# Patient Record
Sex: Male | Born: 1935 | Race: White | Hispanic: No | Marital: Married | Smoking: Never smoker
Health system: Southern US, Community
[De-identification: ages and names within clinical notes are randomized; demographics above are authoritative.]

## PROBLEM LIST (undated history)

## (undated) DIAGNOSIS — Z860100 Personal history of colon polyps, unspecified: Secondary | ICD-10-CM

## (undated) DIAGNOSIS — R351 Nocturia: Secondary | ICD-10-CM

## (undated) DIAGNOSIS — M549 Dorsalgia, unspecified: Secondary | ICD-10-CM

## (undated) DIAGNOSIS — M1990 Unspecified osteoarthritis, unspecified site: Secondary | ICD-10-CM

## (undated) DIAGNOSIS — K449 Diaphragmatic hernia without obstruction or gangrene: Secondary | ICD-10-CM

## (undated) DIAGNOSIS — I639 Cerebral infarction, unspecified: Secondary | ICD-10-CM

## (undated) DIAGNOSIS — H269 Unspecified cataract: Secondary | ICD-10-CM

## (undated) DIAGNOSIS — I10 Essential (primary) hypertension: Secondary | ICD-10-CM

## (undated) DIAGNOSIS — I2510 Atherosclerotic heart disease of native coronary artery without angina pectoris: Secondary | ICD-10-CM

## (undated) DIAGNOSIS — G4730 Sleep apnea, unspecified: Secondary | ICD-10-CM

## (undated) DIAGNOSIS — G8929 Other chronic pain: Secondary | ICD-10-CM

## (undated) DIAGNOSIS — I209 Angina pectoris, unspecified: Secondary | ICD-10-CM

## (undated) DIAGNOSIS — E785 Hyperlipidemia, unspecified: Secondary | ICD-10-CM

## (undated) DIAGNOSIS — R233 Spontaneous ecchymoses: Secondary | ICD-10-CM

## (undated) DIAGNOSIS — I219 Acute myocardial infarction, unspecified: Secondary | ICD-10-CM

## (undated) DIAGNOSIS — K219 Gastro-esophageal reflux disease without esophagitis: Secondary | ICD-10-CM

## (undated) DIAGNOSIS — E119 Type 2 diabetes mellitus without complications: Secondary | ICD-10-CM

## (undated) DIAGNOSIS — D61818 Other pancytopenia: Secondary | ICD-10-CM

## (undated) DIAGNOSIS — R252 Cramp and spasm: Secondary | ICD-10-CM

## (undated) DIAGNOSIS — G459 Transient cerebral ischemic attack, unspecified: Secondary | ICD-10-CM

## (undated) DIAGNOSIS — K5790 Diverticulosis of intestine, part unspecified, without perforation or abscess without bleeding: Secondary | ICD-10-CM

## (undated) DIAGNOSIS — M25569 Pain in unspecified knee: Secondary | ICD-10-CM

## (undated) DIAGNOSIS — S14129A Central cord syndrome at unspecified level of cervical spinal cord, initial encounter: Secondary | ICD-10-CM

## (undated) DIAGNOSIS — R238 Other skin changes: Secondary | ICD-10-CM

## (undated) DIAGNOSIS — R350 Frequency of micturition: Secondary | ICD-10-CM

## (undated) DIAGNOSIS — C439 Malignant melanoma of skin, unspecified: Secondary | ICD-10-CM

## (undated) HISTORY — DX: Acute myocardial infarction, unspecified: I21.9

## (undated) HISTORY — DX: Unspecified osteoarthritis, unspecified site: M19.90

## (undated) HISTORY — DX: Diaphragmatic hernia without obstruction or gangrene: K44.9

## (undated) HISTORY — DX: Pain in unspecified knee: M25.569

## (undated) HISTORY — DX: Other pancytopenia: D61.818

## (undated) HISTORY — DX: Angina pectoris, unspecified: I20.9

## (undated) HISTORY — PX: BACK SURGERY: SHX140

## (undated) HISTORY — DX: Essential (primary) hypertension: I10

## (undated) HISTORY — PX: TONSILLECTOMY: SUR1361

## (undated) HISTORY — PX: COLONOSCOPY: SHX174

## (undated) HISTORY — PX: APPENDECTOMY: SHX54

## (undated) HISTORY — DX: Atherosclerotic heart disease of native coronary artery without angina pectoris: I25.10

## (undated) HISTORY — DX: Sleep apnea, unspecified: G47.30

## (undated) HISTORY — DX: Cerebral infarction, unspecified: I63.9

## (undated) HISTORY — DX: Gastro-esophageal reflux disease without esophagitis: K21.9

---

## 1995-07-20 HISTORY — PX: KNEE SURGERY: SHX244

## 1997-07-19 HISTORY — PX: KNEE ARTHROSCOPY: SUR90

## 1998-02-04 ENCOUNTER — Other Ambulatory Visit: Admission: RE | Admit: 1998-02-04 | Discharge: 1998-02-04 | Payer: Self-pay | Admitting: Internal Medicine

## 1998-03-13 ENCOUNTER — Ambulatory Visit (HOSPITAL_COMMUNITY): Admission: RE | Admit: 1998-03-13 | Discharge: 1998-03-13 | Payer: Self-pay | Admitting: Gastroenterology

## 1999-07-20 DIAGNOSIS — I219 Acute myocardial infarction, unspecified: Secondary | ICD-10-CM

## 1999-07-20 HISTORY — DX: Acute myocardial infarction, unspecified: I21.9

## 1999-09-03 ENCOUNTER — Encounter: Payer: Self-pay | Admitting: Cardiology

## 1999-09-03 ENCOUNTER — Inpatient Hospital Stay (HOSPITAL_COMMUNITY): Admission: EM | Admit: 1999-09-03 | Discharge: 1999-09-05 | Payer: Self-pay | Admitting: Emergency Medicine

## 1999-09-04 HISTORY — PX: CORONARY ANGIOPLASTY: SHX604

## 1999-09-28 ENCOUNTER — Encounter (HOSPITAL_COMMUNITY): Admission: RE | Admit: 1999-09-28 | Discharge: 1999-12-27 | Payer: Self-pay | Admitting: *Deleted

## 1999-12-28 ENCOUNTER — Encounter (HOSPITAL_COMMUNITY): Admission: RE | Admit: 1999-12-28 | Discharge: 2000-03-27 | Payer: Self-pay | Admitting: *Deleted

## 2000-03-31 ENCOUNTER — Encounter: Payer: Self-pay | Admitting: *Deleted

## 2000-03-31 ENCOUNTER — Encounter: Admission: RE | Admit: 2000-03-31 | Discharge: 2000-03-31 | Payer: Self-pay | Admitting: *Deleted

## 2000-07-06 ENCOUNTER — Inpatient Hospital Stay (HOSPITAL_COMMUNITY): Admission: EM | Admit: 2000-07-06 | Discharge: 2000-07-08 | Payer: Self-pay | Admitting: Emergency Medicine

## 2000-07-07 ENCOUNTER — Encounter: Payer: Self-pay | Admitting: Interventional Cardiology

## 2000-07-27 ENCOUNTER — Encounter: Payer: Self-pay | Admitting: Internal Medicine

## 2000-07-27 ENCOUNTER — Encounter: Admission: RE | Admit: 2000-07-27 | Discharge: 2000-07-27 | Payer: Self-pay | Admitting: Internal Medicine

## 2001-04-06 ENCOUNTER — Encounter: Payer: Self-pay | Admitting: Emergency Medicine

## 2001-04-06 ENCOUNTER — Emergency Department (HOSPITAL_COMMUNITY): Admission: EM | Admit: 2001-04-06 | Discharge: 2001-04-06 | Payer: Self-pay | Admitting: Emergency Medicine

## 2001-04-11 ENCOUNTER — Emergency Department (HOSPITAL_COMMUNITY): Admission: EM | Admit: 2001-04-11 | Discharge: 2001-04-11 | Payer: Self-pay

## 2003-04-29 ENCOUNTER — Ambulatory Visit (HOSPITAL_COMMUNITY): Admission: RE | Admit: 2003-04-29 | Discharge: 2003-04-29 | Payer: Self-pay | Admitting: Gastroenterology

## 2005-04-29 ENCOUNTER — Encounter: Admission: RE | Admit: 2005-04-29 | Discharge: 2005-04-29 | Payer: Self-pay | Admitting: Internal Medicine

## 2005-05-07 ENCOUNTER — Encounter: Admission: RE | Admit: 2005-05-07 | Discharge: 2005-05-07 | Payer: Self-pay | Admitting: Internal Medicine

## 2005-07-19 DIAGNOSIS — G459 Transient cerebral ischemic attack, unspecified: Secondary | ICD-10-CM

## 2005-07-19 HISTORY — DX: Transient cerebral ischemic attack, unspecified: G45.9

## 2005-08-26 ENCOUNTER — Emergency Department (HOSPITAL_COMMUNITY): Admission: EM | Admit: 2005-08-26 | Discharge: 2005-08-26 | Payer: Self-pay | Admitting: Emergency Medicine

## 2005-09-02 ENCOUNTER — Ambulatory Visit (HOSPITAL_COMMUNITY): Admission: RE | Admit: 2005-09-02 | Discharge: 2005-09-02 | Payer: Self-pay | Admitting: Internal Medicine

## 2005-09-03 ENCOUNTER — Encounter: Admission: RE | Admit: 2005-09-03 | Discharge: 2005-09-03 | Payer: Self-pay | Admitting: Internal Medicine

## 2009-08-28 ENCOUNTER — Ambulatory Visit: Payer: Self-pay | Admitting: Hematology and Oncology

## 2009-09-03 LAB — MORPHOLOGY: PLT EST: ADEQUATE

## 2009-09-03 LAB — CBC WITH DIFFERENTIAL/PLATELET
Basophils Absolute: 0 10*3/uL (ref 0.0–0.1)
Eosinophils Absolute: 0.1 10*3/uL (ref 0.0–0.5)
HGB: 11.8 g/dL — ABNORMAL LOW (ref 13.0–17.1)
MCV: 103.6 fL — ABNORMAL HIGH (ref 79.3–98.0)
MONO%: 10.9 % (ref 0.0–14.0)
NEUT#: 3.5 10*3/uL (ref 1.5–6.5)
RDW: 12.4 % (ref 11.0–14.6)

## 2009-09-05 ENCOUNTER — Ambulatory Visit (HOSPITAL_COMMUNITY): Admission: RE | Admit: 2009-09-05 | Discharge: 2009-09-05 | Payer: Self-pay | Admitting: Hematology and Oncology

## 2009-09-05 LAB — PROTEIN ELECTROPHORESIS, SERUM, WITH REFLEX
Albumin ELP: 40.4 % — ABNORMAL LOW (ref 55.8–66.1)
Beta 2: 2.5 % — ABNORMAL LOW (ref 3.2–6.5)
Gamma Globulin: 40.9 % — ABNORMAL HIGH (ref 11.1–18.8)

## 2009-09-05 LAB — IFE INTERPRETATION

## 2009-09-05 LAB — COMPREHENSIVE METABOLIC PANEL
ALT: 22 U/L (ref 0–53)
AST: 18 U/L (ref 0–37)
Albumin: 3.5 g/dL (ref 3.5–5.2)
BUN: 23 mg/dL (ref 6–23)
Calcium: 8.6 mg/dL (ref 8.4–10.5)
Chloride: 103 mEq/L (ref 96–112)
Potassium: 4 mEq/L (ref 3.5–5.3)
Sodium: 134 mEq/L — ABNORMAL LOW (ref 135–145)
Total Protein: 9.6 g/dL — ABNORMAL HIGH (ref 6.0–8.3)

## 2009-09-05 LAB — VITAMIN B12: Vitamin B-12: 457 pg/mL (ref 211–911)

## 2009-09-05 LAB — IGG, IGA, IGM
IgA: 31 mg/dL — ABNORMAL LOW (ref 68–378)
IgG (Immunoglobin G), Serum: 4910 mg/dL — ABNORMAL HIGH (ref 694–1618)
IgM, Serum: 28 mg/dL — ABNORMAL LOW (ref 60–263)

## 2009-09-05 LAB — KAPPA/LAMBDA LIGHT CHAINS
Kappa free light chain: 3.9 mg/dL — ABNORMAL HIGH (ref 0.33–1.94)
Kappa:Lambda Ratio: 3.48 — ABNORMAL HIGH (ref 0.26–1.65)
Lambda Free Lght Chn: 1.12 mg/dL (ref 0.57–2.63)

## 2009-09-05 LAB — ERYTHROPOIETIN: Erythropoietin: 23.2 m[IU]/mL (ref 2.6–34.0)

## 2009-09-19 ENCOUNTER — Other Ambulatory Visit: Admission: RE | Admit: 2009-09-19 | Discharge: 2009-09-19 | Payer: Self-pay | Admitting: Hematology and Oncology

## 2009-09-30 ENCOUNTER — Ambulatory Visit: Payer: Self-pay | Admitting: Hematology and Oncology

## 2009-10-17 LAB — COMPREHENSIVE METABOLIC PANEL
ALT: 18 U/L (ref 0–53)
AST: 17 U/L (ref 0–37)
BUN: 21 mg/dL (ref 6–23)
Chloride: 104 mEq/L (ref 96–112)
Creatinine, Ser: 1.09 mg/dL (ref 0.40–1.50)
Glucose, Bld: 125 mg/dL — ABNORMAL HIGH (ref 70–99)
Total Bilirubin: 0.3 mg/dL (ref 0.3–1.2)

## 2009-10-17 LAB — LACTATE DEHYDROGENASE: LDH: 148 U/L (ref 94–250)

## 2009-10-17 LAB — CBC WITH DIFFERENTIAL/PLATELET
LYMPH%: 30.9 % (ref 14.0–49.0)
MONO#: 0.5 10*3/uL (ref 0.1–0.9)
NEUT#: 3.1 10*3/uL (ref 1.5–6.5)
NEUT%: 57 % (ref 39.0–75.0)
RBC: 2.94 10*6/uL — ABNORMAL LOW (ref 4.20–5.82)
RDW: 12.9 % (ref 11.0–14.6)
WBC: 5.4 10*3/uL (ref 4.0–10.3)

## 2009-10-31 ENCOUNTER — Ambulatory Visit: Payer: Self-pay | Admitting: Hematology and Oncology

## 2009-10-31 LAB — CBC WITH DIFFERENTIAL/PLATELET
EOS%: 0.6 % (ref 0.0–7.0)
Eosinophils Absolute: 0.1 10*3/uL (ref 0.0–0.5)
HCT: 29.9 % — ABNORMAL LOW (ref 38.4–49.9)
HGB: 9.9 g/dL — ABNORMAL LOW (ref 13.0–17.1)
LYMPH%: 12.8 % — ABNORMAL LOW (ref 14.0–49.0)
MCH: 35.2 pg — ABNORMAL HIGH (ref 27.2–33.4)
MCHC: 33.1 g/dL (ref 32.0–36.0)
MCV: 106.4 fL — ABNORMAL HIGH (ref 79.3–98.0)
MONO%: 7.8 % (ref 0.0–14.0)
NEUT#: 6.4 10*3/uL (ref 1.5–6.5)
NEUT%: 78.7 % — ABNORMAL HIGH (ref 39.0–75.0)
lymph#: 1 10*3/uL (ref 0.9–3.3)

## 2009-11-04 LAB — CBC WITH DIFFERENTIAL/PLATELET
HGB: 9.5 g/dL — ABNORMAL LOW (ref 13.0–17.1)
LYMPH%: 13.8 % — ABNORMAL LOW (ref 14.0–49.0)
MCH: 36.8 pg — ABNORMAL HIGH (ref 27.2–33.4)
MCHC: 34.5 g/dL (ref 32.0–36.0)
MONO#: 0.7 10*3/uL (ref 0.1–0.9)
RDW: 13.1 % (ref 11.0–14.6)
WBC: 6.8 10*3/uL (ref 4.0–10.3)
lymph#: 0.9 10*3/uL (ref 0.9–3.3)

## 2009-11-11 LAB — CBC WITH DIFFERENTIAL/PLATELET
BASO%: 0.2 % (ref 0.0–2.0)
Basophils Absolute: 0 10*3/uL (ref 0.0–0.1)
EOS%: 0.8 % (ref 0.0–7.0)
HCT: 29 % — ABNORMAL LOW (ref 38.4–49.9)
HGB: 9.8 g/dL — ABNORMAL LOW (ref 13.0–17.1)
LYMPH%: 16.8 % (ref 14.0–49.0)
MCH: 35.6 pg — ABNORMAL HIGH (ref 27.2–33.4)
MCV: 105.5 fL — ABNORMAL HIGH (ref 79.3–98.0)
MONO%: 7 % (ref 0.0–14.0)
NEUT#: 6.3 10*3/uL (ref 1.5–6.5)
NEUT%: 75.2 % — ABNORMAL HIGH (ref 39.0–75.0)
Platelets: 189 10*3/uL (ref 140–400)
RDW: 13.3 % (ref 11.0–14.6)

## 2009-11-11 LAB — COMPREHENSIVE METABOLIC PANEL
Albumin: 3.3 g/dL — ABNORMAL LOW (ref 3.5–5.2)
Alkaline Phosphatase: 75 U/L (ref 39–117)
BUN: 25 mg/dL — ABNORMAL HIGH (ref 6–23)
Glucose, Bld: 140 mg/dL — ABNORMAL HIGH (ref 70–99)
Potassium: 4 mEq/L (ref 3.5–5.3)
Total Bilirubin: 0.3 mg/dL (ref 0.3–1.2)

## 2009-12-08 ENCOUNTER — Ambulatory Visit: Payer: Self-pay | Admitting: Hematology and Oncology

## 2009-12-09 LAB — BASIC METABOLIC PANEL
Calcium: 8.8 mg/dL (ref 8.4–10.5)
Chloride: 105 mEq/L (ref 96–112)
Glucose, Bld: 214 mg/dL — ABNORMAL HIGH (ref 70–99)
Potassium: 4.2 mEq/L (ref 3.5–5.3)
Sodium: 135 mEq/L (ref 135–145)

## 2009-12-09 LAB — CBC WITH DIFFERENTIAL/PLATELET
BASO%: 0.2 % (ref 0.0–2.0)
Basophils Absolute: 0 10*3/uL (ref 0.0–0.1)
MCHC: 33.1 g/dL (ref 32.0–36.0)
Platelets: 181 10*3/uL (ref 140–400)
RBC: 2.83 10*6/uL — ABNORMAL LOW (ref 4.20–5.82)
RDW: 13.4 % (ref 11.0–14.6)

## 2009-12-16 LAB — CBC WITH DIFFERENTIAL/PLATELET
EOS%: 0.8 % (ref 0.0–7.0)
Eosinophils Absolute: 0 10*3/uL (ref 0.0–0.5)
HCT: 31.1 % — ABNORMAL LOW (ref 38.4–49.9)
HGB: 10.4 g/dL — ABNORMAL LOW (ref 13.0–17.1)
LYMPH%: 16.6 % (ref 14.0–49.0)
MCV: 106.9 fL — ABNORMAL HIGH (ref 79.3–98.0)
MONO#: 0.5 10*3/uL (ref 0.1–0.9)
MONO%: 9.5 % (ref 0.0–14.0)
NEUT#: 3.7 10*3/uL (ref 1.5–6.5)
Platelets: 145 10*3/uL (ref 140–400)
RBC: 2.91 10*6/uL — ABNORMAL LOW (ref 4.20–5.82)
RDW: 13.5 % (ref 11.0–14.6)
WBC: 5.1 10*3/uL (ref 4.0–10.3)

## 2009-12-25 LAB — COMPREHENSIVE METABOLIC PANEL
AST: 19 U/L (ref 0–37)
Albumin: 3.4 g/dL — ABNORMAL LOW (ref 3.5–5.2)
Alkaline Phosphatase: 75 U/L (ref 39–117)
Potassium: 4.4 mEq/L (ref 3.5–5.3)
Sodium: 137 mEq/L (ref 135–145)
Total Protein: 7.7 g/dL (ref 6.0–8.3)

## 2009-12-25 LAB — CBC WITH DIFFERENTIAL/PLATELET
Basophils Absolute: 0 10*3/uL (ref 0.0–0.1)
HGB: 10.3 g/dL — ABNORMAL LOW (ref 13.0–17.1)
LYMPH%: 14.2 % (ref 14.0–49.0)
MCH: 37.2 pg — ABNORMAL HIGH (ref 27.2–33.4)
MCV: 106.9 fL — ABNORMAL HIGH (ref 79.3–98.0)
MONO%: 10 % (ref 0.0–14.0)
NEUT#: 3.7 10*3/uL (ref 1.5–6.5)
Platelets: 133 10*3/uL — ABNORMAL LOW (ref 140–400)
RDW: 13.5 % (ref 11.0–14.6)

## 2009-12-30 LAB — CBC WITH DIFFERENTIAL/PLATELET
BASO%: 0.4 % (ref 0.0–2.0)
Eosinophils Absolute: 0.1 10*3/uL (ref 0.0–0.5)
HCT: 30.4 % — ABNORMAL LOW (ref 38.4–49.9)
MCHC: 32.9 g/dL (ref 32.0–36.0)
MCV: 106.7 fL — ABNORMAL HIGH (ref 79.3–98.0)
RBC: 2.85 10*6/uL — ABNORMAL LOW (ref 4.20–5.82)
RDW: 13.3 % (ref 11.0–14.6)
lymph#: 1.2 10*3/uL (ref 0.9–3.3)

## 2010-01-06 LAB — CBC WITH DIFFERENTIAL/PLATELET
BASO%: 0.3 % (ref 0.0–2.0)
HGB: 10.4 g/dL — ABNORMAL LOW (ref 13.0–17.1)
LYMPH%: 24.6 % (ref 14.0–49.0)
MCH: 37.6 pg — ABNORMAL HIGH (ref 27.2–33.4)
MCV: 106.3 fL — ABNORMAL HIGH (ref 79.3–98.0)
MONO#: 0.8 10*3/uL (ref 0.1–0.9)
MONO%: 17.1 % — ABNORMAL HIGH (ref 0.0–14.0)
NEUT#: 2.5 10*3/uL (ref 1.5–6.5)
Platelets: 139 10*3/uL — ABNORMAL LOW (ref 140–400)

## 2010-01-06 LAB — COMPREHENSIVE METABOLIC PANEL
ALT: 15 U/L (ref 0–53)
Albumin: 3.5 g/dL (ref 3.5–5.2)
Alkaline Phosphatase: 79 U/L (ref 39–117)
Calcium: 8.6 mg/dL (ref 8.4–10.5)
Glucose, Bld: 108 mg/dL — ABNORMAL HIGH (ref 70–99)
Sodium: 135 mEq/L (ref 135–145)

## 2010-01-07 ENCOUNTER — Ambulatory Visit: Payer: Self-pay | Admitting: Hematology and Oncology

## 2010-01-20 LAB — BASIC METABOLIC PANEL
BUN: 22 mg/dL (ref 6–23)
CO2: 22 mEq/L (ref 19–32)
Chloride: 105 mEq/L (ref 96–112)
Creatinine, Ser: 0.88 mg/dL (ref 0.40–1.50)
Potassium: 4.3 mEq/L (ref 3.5–5.3)

## 2010-01-20 LAB — CBC WITH DIFFERENTIAL/PLATELET
EOS%: 0.1 % (ref 0.0–7.0)
HCT: 29.9 % — ABNORMAL LOW (ref 38.4–49.9)
MCH: 35.6 pg — ABNORMAL HIGH (ref 27.2–33.4)
MCHC: 33.3 g/dL (ref 32.0–36.0)
MONO%: 3.3 % (ref 0.0–14.0)
Platelets: 230 10*3/uL (ref 140–400)
RBC: 2.8 10*6/uL — ABNORMAL LOW (ref 4.20–5.82)
WBC: 7.1 10*3/uL (ref 4.0–10.3)

## 2010-01-27 LAB — CBC WITH DIFFERENTIAL/PLATELET
Eosinophils Absolute: 0 10*3/uL (ref 0.0–0.5)
MCH: 36.5 pg — ABNORMAL HIGH (ref 27.2–33.4)
MCHC: 34 g/dL (ref 32.0–36.0)
MCV: 107.6 fL — ABNORMAL HIGH (ref 79.3–98.0)
MONO%: 6 % (ref 0.0–14.0)
NEUT%: 84.5 % — ABNORMAL HIGH (ref 39.0–75.0)
Platelets: 172 10*3/uL (ref 140–400)
RBC: 2.61 10*6/uL — ABNORMAL LOW (ref 4.20–5.82)
lymph#: 0.7 10*3/uL — ABNORMAL LOW (ref 0.9–3.3)

## 2010-01-30 LAB — CBC WITH DIFFERENTIAL/PLATELET
BASO%: 0.2 % (ref 0.0–2.0)
Basophils Absolute: 0 10*3/uL (ref 0.0–0.1)
EOS%: 0.6 % (ref 0.0–7.0)
MCH: 36 pg — ABNORMAL HIGH (ref 27.2–33.4)
MCHC: 33.6 g/dL (ref 32.0–36.0)
MCV: 107.2 fL — ABNORMAL HIGH (ref 79.3–98.0)
MONO%: 14.9 % — ABNORMAL HIGH (ref 0.0–14.0)
Platelets: 168 10*3/uL (ref 140–400)
RBC: 2.65 10*6/uL — ABNORMAL LOW (ref 4.20–5.82)
RDW: 13.7 % (ref 11.0–14.6)

## 2010-02-06 ENCOUNTER — Ambulatory Visit: Payer: Self-pay | Admitting: Hematology and Oncology

## 2010-02-10 LAB — BASIC METABOLIC PANEL
Calcium: 8.6 mg/dL (ref 8.4–10.5)
Potassium: 4.2 mEq/L (ref 3.5–5.3)
Sodium: 136 mEq/L (ref 135–145)

## 2010-02-10 LAB — CBC WITH DIFFERENTIAL/PLATELET
EOS%: 1 % (ref 0.0–7.0)
HGB: 10.1 g/dL — ABNORMAL LOW (ref 13.0–17.1)
LYMPH%: 28 % (ref 14.0–49.0)
MCV: 106.9 fL — ABNORMAL HIGH (ref 79.3–98.0)
NEUT#: 1.7 10*3/uL (ref 1.5–6.5)
NEUT%: 51.6 % (ref 39.0–75.0)
RBC: 2.71 10*6/uL — ABNORMAL LOW (ref 4.20–5.82)
RDW: 14.2 % (ref 11.0–14.6)
WBC: 3.4 10*3/uL — ABNORMAL LOW (ref 4.0–10.3)

## 2010-02-23 LAB — CBC WITH DIFFERENTIAL/PLATELET
BASO%: 0.2 % (ref 0.0–2.0)
Basophils Absolute: 0 10*3/uL (ref 0.0–0.1)
EOS%: 0.4 % (ref 0.0–7.0)
HCT: 29.5 % — ABNORMAL LOW (ref 38.4–49.9)
LYMPH%: 18.8 % (ref 14.0–49.0)
MCH: 36.9 pg — ABNORMAL HIGH (ref 27.2–33.4)
Platelets: 105 10*3/uL — ABNORMAL LOW (ref 140–400)
RBC: 2.79 10*6/uL — ABNORMAL LOW (ref 4.20–5.82)
RDW: 13.8 % (ref 11.0–14.6)
WBC: 4.3 10*3/uL (ref 4.0–10.3)

## 2010-02-25 LAB — COMPREHENSIVE METABOLIC PANEL
AST: 18 U/L (ref 0–37)
Albumin: 3.8 g/dL (ref 3.5–5.2)
BUN: 33 mg/dL — ABNORMAL HIGH (ref 6–23)
Calcium: 9.1 mg/dL (ref 8.4–10.5)
Potassium: 4.6 mEq/L (ref 3.5–5.3)
Sodium: 136 mEq/L (ref 135–145)
Total Protein: 8 g/dL (ref 6.0–8.3)

## 2010-02-25 LAB — SPEP & IFE WITH QIG
Albumin ELP: 45.4 % — ABNORMAL LOW (ref 55.8–66.1)
Alpha-1-Globulin: 5.2 % — ABNORMAL HIGH (ref 2.9–4.9)
Alpha-2-Globulin: 11.5 % (ref 7.1–11.8)
Gamma Globulin: 31.2 % — ABNORMAL HIGH (ref 11.1–18.8)
IgM, Serum: 23 mg/dL — ABNORMAL LOW (ref 60–263)
Total Protein, Serum Electrophoresis: 8 g/dL (ref 6.0–8.3)

## 2010-02-25 LAB — KAPPA/LAMBDA LIGHT CHAINS: Kappa:Lambda Ratio: 3.35 — ABNORMAL HIGH (ref 0.26–1.65)

## 2010-02-25 LAB — BETA 2 MICROGLOBULIN, SERUM: Beta-2 Microglobulin: 3.77 mg/L — ABNORMAL HIGH (ref 1.01–1.73)

## 2010-03-03 LAB — CBC WITH DIFFERENTIAL/PLATELET
BASO%: 0.3 % (ref 0.0–2.0)
Eosinophils Absolute: 0 10*3/uL (ref 0.0–0.5)
HCT: 28.6 % — ABNORMAL LOW (ref 38.4–49.9)
MCH: 36.8 pg — ABNORMAL HIGH (ref 27.2–33.4)
MCHC: 34.5 g/dL (ref 32.0–36.0)
MCV: 106.5 fL — ABNORMAL HIGH (ref 79.3–98.0)
Platelets: 183 10*3/uL (ref 140–400)
WBC: 5 10*3/uL (ref 4.0–10.3)
lymph#: 0.5 10*3/uL — ABNORMAL LOW (ref 0.9–3.3)

## 2010-03-03 LAB — COMPREHENSIVE METABOLIC PANEL
ALT: 20 U/L (ref 0–53)
AST: 21 U/L (ref 0–37)
BUN: 26 mg/dL — ABNORMAL HIGH (ref 6–23)
CO2: 23 mEq/L (ref 19–32)
Calcium: 8.7 mg/dL (ref 8.4–10.5)
Chloride: 104 mEq/L (ref 96–112)
Creatinine, Ser: 0.96 mg/dL (ref 0.40–1.50)
Glucose, Bld: 135 mg/dL — ABNORMAL HIGH (ref 70–99)
Potassium: 4.4 mEq/L (ref 3.5–5.3)
Sodium: 137 mEq/L (ref 135–145)

## 2010-03-10 ENCOUNTER — Ambulatory Visit: Payer: Self-pay | Admitting: Hematology and Oncology

## 2010-03-10 LAB — CBC WITH DIFFERENTIAL/PLATELET
BASO%: 0.2 % (ref 0.0–2.0)
HGB: 9.6 g/dL — ABNORMAL LOW (ref 13.0–17.1)
LYMPH%: 18.7 % (ref 14.0–49.0)
MCH: 34.9 pg — ABNORMAL HIGH (ref 27.2–33.4)
MONO#: 0.5 10*3/uL (ref 0.1–0.9)
MONO%: 12.1 % (ref 0.0–14.0)
NEUT#: 2.8 10*3/uL (ref 1.5–6.5)
NEUT%: 68.3 % (ref 39.0–75.0)
Platelets: 171 10*3/uL (ref 140–400)
RBC: 2.75 10*6/uL — ABNORMAL LOW (ref 4.20–5.82)

## 2010-03-24 LAB — CBC WITH DIFFERENTIAL/PLATELET
BASO%: 0.3 % (ref 0.0–2.0)
MCHC: 34.8 g/dL (ref 32.0–36.0)
MONO#: 0.6 10*3/uL (ref 0.1–0.9)
NEUT#: 2.4 10*3/uL (ref 1.5–6.5)
NEUT%: 61.2 % (ref 39.0–75.0)
Platelets: 130 10*3/uL — ABNORMAL LOW (ref 140–400)
RBC: 2.61 10*6/uL — ABNORMAL LOW (ref 4.20–5.82)

## 2010-03-31 LAB — CBC WITH DIFFERENTIAL/PLATELET
Eosinophils Absolute: 0 10*3/uL (ref 0.0–0.5)
HCT: 29.1 % — ABNORMAL LOW (ref 38.4–49.9)
LYMPH%: 26.8 % (ref 14.0–49.0)
MCHC: 34.4 g/dL (ref 32.0–36.0)
MONO#: 0.8 10*3/uL (ref 0.1–0.9)
MONO%: 18.3 % — ABNORMAL HIGH (ref 0.0–14.0)
NEUT#: 2.3 10*3/uL (ref 1.5–6.5)
NEUT%: 53.7 % (ref 39.0–75.0)
Platelets: 127 10*3/uL — ABNORMAL LOW (ref 140–400)
WBC: 4.3 10*3/uL (ref 4.0–10.3)

## 2010-04-10 ENCOUNTER — Ambulatory Visit: Payer: Self-pay | Admitting: Hematology and Oncology

## 2010-04-14 LAB — CBC WITH DIFFERENTIAL/PLATELET
MCHC: 33.4 g/dL (ref 32.0–36.0)
MCV: 105.7 fL — ABNORMAL HIGH (ref 79.3–98.0)
MONO#: 0.2 10*3/uL (ref 0.1–0.9)
MONO%: 4.4 % (ref 0.0–14.0)
RBC: 2.97 10*6/uL — ABNORMAL LOW (ref 4.20–5.82)
RDW: 13.8 % (ref 11.0–14.6)
lymph#: 0.7 10*3/uL — ABNORMAL LOW (ref 0.9–3.3)

## 2010-04-14 LAB — COMPREHENSIVE METABOLIC PANEL
AST: 18 U/L (ref 0–37)
BUN: 27 mg/dL — ABNORMAL HIGH (ref 6–23)
Chloride: 105 mEq/L (ref 96–112)
Creatinine, Ser: 0.98 mg/dL (ref 0.40–1.50)
Glucose, Bld: 133 mg/dL — ABNORMAL HIGH (ref 70–99)
Potassium: 4.4 mEq/L (ref 3.5–5.3)

## 2010-04-21 LAB — CBC WITH DIFFERENTIAL/PLATELET
Basophils Absolute: 0 10*3/uL (ref 0.0–0.1)
EOS%: 0.4 % (ref 0.0–7.0)
Eosinophils Absolute: 0 10*3/uL (ref 0.0–0.5)
HGB: 10.6 g/dL — ABNORMAL LOW (ref 13.0–17.1)
MCH: 38.1 pg — ABNORMAL HIGH (ref 27.2–33.4)
MCHC: 35.6 g/dL (ref 32.0–36.0)
MONO%: 10.2 % (ref 0.0–14.0)
NEUT%: 73.2 % (ref 39.0–75.0)
RBC: 2.77 10*6/uL — ABNORMAL LOW (ref 4.20–5.82)
WBC: 5.1 10*3/uL (ref 4.0–10.3)
lymph#: 0.8 10*3/uL — ABNORMAL LOW (ref 0.9–3.3)

## 2010-04-21 LAB — COMPREHENSIVE METABOLIC PANEL
ALT: 17 U/L (ref 0–53)
AST: 15 U/L (ref 0–37)
Albumin: 3.4 g/dL — ABNORMAL LOW (ref 3.5–5.2)
Alkaline Phosphatase: 65 U/L (ref 39–117)
BUN: 23 mg/dL (ref 6–23)
Creatinine, Ser: 0.92 mg/dL (ref 0.40–1.50)
Potassium: 4 mEq/L (ref 3.5–5.3)

## 2010-05-05 LAB — CBC WITH DIFFERENTIAL/PLATELET
EOS%: 0.8 % (ref 0.0–7.0)
Eosinophils Absolute: 0 10*3/uL (ref 0.0–0.5)
LYMPH%: 22.8 % (ref 14.0–49.0)
MCH: 37.5 pg — ABNORMAL HIGH (ref 27.2–33.4)
MCHC: 34.7 g/dL (ref 32.0–36.0)
MONO#: 0.7 10*3/uL (ref 0.1–0.9)
NEUT#: 2.2 10*3/uL (ref 1.5–6.5)
NEUT%: 57.7 % (ref 39.0–75.0)
RBC: 2.59 10*6/uL — ABNORMAL LOW (ref 4.20–5.82)
RDW: 14.6 % (ref 11.0–14.6)

## 2010-05-22 ENCOUNTER — Ambulatory Visit: Payer: Self-pay | Admitting: Hematology and Oncology

## 2010-05-26 LAB — CBC WITH DIFFERENTIAL/PLATELET
Eosinophils Absolute: 0 10*3/uL (ref 0.0–0.5)
LYMPH%: 11.4 % — ABNORMAL LOW (ref 14.0–49.0)
MONO#: 0.1 10*3/uL (ref 0.1–0.9)
NEUT#: 2.9 10*3/uL (ref 1.5–6.5)
Platelets: 164 10*3/uL (ref 140–400)
RBC: 2.87 10*6/uL — ABNORMAL LOW (ref 4.20–5.82)
RDW: 13.8 % (ref 11.0–14.6)
WBC: 3.4 10*3/uL — ABNORMAL LOW (ref 4.0–10.3)

## 2010-05-26 LAB — BASIC METABOLIC PANEL
BUN: 25 mg/dL — ABNORMAL HIGH (ref 6–23)
Potassium: 4.4 mEq/L (ref 3.5–5.3)

## 2010-06-02 LAB — CBC WITH DIFFERENTIAL/PLATELET
Basophils Absolute: 0 10*3/uL (ref 0.0–0.1)
Eosinophils Absolute: 0 10*3/uL (ref 0.0–0.5)
HCT: 30.6 % — ABNORMAL LOW (ref 38.4–49.9)
LYMPH%: 14.4 % (ref 14.0–49.0)
MONO#: 0.4 10*3/uL (ref 0.1–0.9)
NEUT#: 3.6 10*3/uL (ref 1.5–6.5)
NEUT%: 76 % — ABNORMAL HIGH (ref 39.0–75.0)
Platelets: 172 10*3/uL (ref 140–400)
WBC: 4.7 10*3/uL (ref 4.0–10.3)

## 2010-06-09 LAB — CBC WITH DIFFERENTIAL/PLATELET
BASO%: 0.3 % (ref 0.0–2.0)
EOS%: 0.6 % (ref 0.0–7.0)
HCT: 29.7 % — ABNORMAL LOW (ref 38.4–49.9)
LYMPH%: 20.2 % (ref 14.0–49.0)
MCH: 37.4 pg — ABNORMAL HIGH (ref 27.2–33.4)
MCHC: 34.4 g/dL (ref 32.0–36.0)
NEUT%: 61.9 % (ref 39.0–75.0)
Platelets: 151 10*3/uL (ref 140–400)

## 2010-06-16 LAB — CBC WITH DIFFERENTIAL/PLATELET
BASO%: 0.2 % (ref 0.0–2.0)
EOS%: 0.5 % (ref 0.0–7.0)
HCT: 30.3 % — ABNORMAL LOW (ref 38.4–49.9)
MCH: 36.3 pg — ABNORMAL HIGH (ref 27.2–33.4)
MCHC: 34 g/dL (ref 32.0–36.0)
MCV: 106.7 fL — ABNORMAL HIGH (ref 79.3–98.0)
MONO%: 17.4 % — ABNORMAL HIGH (ref 0.0–14.0)
NEUT%: 55.7 % (ref 39.0–75.0)
lymph#: 1.1 10*3/uL (ref 0.9–3.3)

## 2010-06-26 ENCOUNTER — Ambulatory Visit: Payer: Self-pay | Admitting: Hematology and Oncology

## 2010-06-30 LAB — COMPREHENSIVE METABOLIC PANEL
ALT: 20 U/L (ref 0–53)
AST: 20 U/L (ref 0–37)
Alkaline Phosphatase: 80 U/L (ref 39–117)
BUN: 28 mg/dL — ABNORMAL HIGH (ref 6–23)
Creatinine, Ser: 0.9 mg/dL (ref 0.40–1.50)
Potassium: 4.3 mEq/L (ref 3.5–5.3)

## 2010-06-30 LAB — CBC WITH DIFFERENTIAL/PLATELET
Basophils Absolute: 0 10*3/uL (ref 0.0–0.1)
EOS%: 0 % (ref 0.0–7.0)
HCT: 29.3 % — ABNORMAL LOW (ref 38.4–49.9)
HGB: 10.2 g/dL — ABNORMAL LOW (ref 13.0–17.1)
LYMPH%: 5.3 % — ABNORMAL LOW (ref 14.0–49.0)
MCH: 38.1 pg — ABNORMAL HIGH (ref 27.2–33.4)
MCHC: 34.6 g/dL (ref 32.0–36.0)
MCV: 110 fL — ABNORMAL HIGH (ref 79.3–98.0)
MONO%: 7.4 % (ref 0.0–14.0)
NEUT%: 87.3 % — ABNORMAL HIGH (ref 39.0–75.0)

## 2010-07-07 LAB — CBC WITH DIFFERENTIAL/PLATELET
BASO%: 0.2 % (ref 0.0–2.0)
Basophils Absolute: 0 10*3/uL (ref 0.0–0.1)
EOS%: 1 % (ref 0.0–7.0)
HGB: 10.2 g/dL — ABNORMAL LOW (ref 13.0–17.1)
MCH: 38 pg — ABNORMAL HIGH (ref 27.2–33.4)
MCHC: 34.6 g/dL (ref 32.0–36.0)
MCV: 109.9 fL — ABNORMAL HIGH (ref 79.3–98.0)
MONO%: 15.3 % — ABNORMAL HIGH (ref 0.0–14.0)
RBC: 2.69 10*6/uL — ABNORMAL LOW (ref 4.20–5.82)
RDW: 14.3 % (ref 11.0–14.6)

## 2010-07-21 LAB — CBC WITH DIFFERENTIAL/PLATELET
BASO%: 0.3 % (ref 0.0–2.0)
HCT: 29 % — ABNORMAL LOW (ref 38.4–49.9)
HGB: 10.2 g/dL — ABNORMAL LOW (ref 13.0–17.1)
MCHC: 35.2 g/dL (ref 32.0–36.0)
MCV: 109.3 fL — ABNORMAL HIGH (ref 79.3–98.0)
MONO#: 0.7 10*3/uL (ref 0.1–0.9)
MONO%: 19.4 % — ABNORMAL HIGH (ref 0.0–14.0)
Platelets: 106 10*3/uL — ABNORMAL LOW (ref 140–400)
RBC: 2.65 10*6/uL — ABNORMAL LOW (ref 4.20–5.82)

## 2010-08-07 ENCOUNTER — Ambulatory Visit: Payer: Self-pay | Admitting: Hematology and Oncology

## 2010-08-11 LAB — COMPREHENSIVE METABOLIC PANEL
AST: 24 U/L (ref 0–37)
Alkaline Phosphatase: 68 U/L (ref 39–117)
BUN: 23 mg/dL (ref 6–23)
CO2: 23 mEq/L (ref 19–32)
Calcium: 9 mg/dL (ref 8.4–10.5)
Creatinine, Ser: 0.98 mg/dL (ref 0.40–1.50)
Glucose, Bld: 192 mg/dL — ABNORMAL HIGH (ref 70–99)
Total Bilirubin: 0.3 mg/dL (ref 0.3–1.2)

## 2010-08-11 LAB — CBC WITH DIFFERENTIAL/PLATELET
Basophils Absolute: 0 10*3/uL (ref 0.0–0.1)
EOS%: 0.2 % (ref 0.0–7.0)
Eosinophils Absolute: 0 10*3/uL (ref 0.0–0.5)
MCH: 37.8 pg — ABNORMAL HIGH (ref 27.2–33.4)
MCHC: 34.4 g/dL (ref 32.0–36.0)
MONO#: 0.2 10*3/uL (ref 0.1–0.9)
MONO%: 3.8 % (ref 0.0–14.0)
NEUT%: 81.1 % — ABNORMAL HIGH (ref 39.0–75.0)
RBC: 2.79 10*6/uL — ABNORMAL LOW (ref 4.20–5.82)
RDW: 14.4 % (ref 11.0–14.6)
WBC: 4.1 10*3/uL (ref 4.0–10.3)
lymph#: 0.6 10*3/uL — ABNORMAL LOW (ref 0.9–3.3)

## 2010-08-18 LAB — CBC WITH DIFFERENTIAL/PLATELET
EOS%: 0.7 % (ref 0.0–7.0)
MCHC: 34.1 g/dL (ref 32.0–36.0)
MCV: 110.8 fL — ABNORMAL HIGH (ref 79.3–98.0)
MONO#: 0.4 10*3/uL (ref 0.1–0.9)
Platelets: 151 10*3/uL (ref 140–400)
RDW: 14.8 % — ABNORMAL HIGH (ref 11.0–14.6)
lymph#: 0.7 10*3/uL — ABNORMAL LOW (ref 0.9–3.3)

## 2010-08-25 ENCOUNTER — Other Ambulatory Visit: Payer: Self-pay | Admitting: Hematology and Oncology

## 2010-08-25 ENCOUNTER — Encounter (HOSPITAL_BASED_OUTPATIENT_CLINIC_OR_DEPARTMENT_OTHER): Payer: Medicare Other | Admitting: Hematology and Oncology

## 2010-08-25 DIAGNOSIS — Z23 Encounter for immunization: Secondary | ICD-10-CM

## 2010-08-25 DIAGNOSIS — C9000 Multiple myeloma not having achieved remission: Secondary | ICD-10-CM

## 2010-08-25 DIAGNOSIS — Z5112 Encounter for antineoplastic immunotherapy: Secondary | ICD-10-CM

## 2010-08-25 LAB — CBC WITH DIFFERENTIAL/PLATELET
BASO%: 0 % (ref 0.0–2.0)
EOS%: 0.8 % (ref 0.0–7.0)
Eosinophils Absolute: 0 10*3/uL (ref 0.0–0.5)
HCT: 31 % — ABNORMAL LOW (ref 38.4–49.9)
LYMPH%: 19.1 % (ref 14.0–49.0)
MCH: 37.2 pg — ABNORMAL HIGH (ref 27.2–33.4)
MCHC: 34.2 g/dL (ref 32.0–36.0)
MCV: 108.8 fL — ABNORMAL HIGH (ref 79.3–98.0)
MONO#: 0.7 10*3/uL (ref 0.1–0.9)
Platelets: 132 10*3/uL — ABNORMAL LOW (ref 140–400)
WBC: 4 10*3/uL (ref 4.0–10.3)

## 2010-09-01 ENCOUNTER — Encounter (HOSPITAL_BASED_OUTPATIENT_CLINIC_OR_DEPARTMENT_OTHER): Payer: Medicare Other | Admitting: Hematology and Oncology

## 2010-09-01 ENCOUNTER — Other Ambulatory Visit: Payer: Self-pay | Admitting: Hematology and Oncology

## 2010-09-01 DIAGNOSIS — C9000 Multiple myeloma not having achieved remission: Secondary | ICD-10-CM

## 2010-09-01 DIAGNOSIS — Z5112 Encounter for antineoplastic immunotherapy: Secondary | ICD-10-CM

## 2010-09-01 LAB — CBC WITH DIFFERENTIAL/PLATELET
EOS%: 0.7 % (ref 0.0–7.0)
LYMPH%: 23.8 % (ref 14.0–49.0)
MCH: 37.8 pg — ABNORMAL HIGH (ref 27.2–33.4)
MONO#: 0.7 10*3/uL (ref 0.1–0.9)
MONO%: 20.3 % — ABNORMAL HIGH (ref 0.0–14.0)
NEUT#: 1.9 10*3/uL (ref 1.5–6.5)
RDW: 14.9 % — ABNORMAL HIGH (ref 11.0–14.6)
WBC: 3.4 10*3/uL — ABNORMAL LOW (ref 4.0–10.3)

## 2010-09-22 ENCOUNTER — Other Ambulatory Visit: Payer: Self-pay | Admitting: Hematology and Oncology

## 2010-09-22 ENCOUNTER — Encounter (HOSPITAL_BASED_OUTPATIENT_CLINIC_OR_DEPARTMENT_OTHER): Payer: Medicare Other | Admitting: Hematology and Oncology

## 2010-09-22 DIAGNOSIS — Z8579 Personal history of other malignant neoplasms of lymphoid, hematopoietic and related tissues: Secondary | ICD-10-CM

## 2010-09-22 DIAGNOSIS — Z5112 Encounter for antineoplastic immunotherapy: Secondary | ICD-10-CM

## 2010-09-22 DIAGNOSIS — C9000 Multiple myeloma not having achieved remission: Secondary | ICD-10-CM

## 2010-09-22 LAB — CBC WITH DIFFERENTIAL/PLATELET
BASO%: 0 % (ref 0.0–2.0)
Basophils Absolute: 0 10*3/uL (ref 0.0–0.1)
HCT: 30.7 % — ABNORMAL LOW (ref 38.4–49.9)
HGB: 10.8 g/dL — ABNORMAL LOW (ref 13.0–17.1)
LYMPH%: 12.8 % — ABNORMAL LOW (ref 14.0–49.0)
MCH: 39.2 pg — ABNORMAL HIGH (ref 27.2–33.4)
MCHC: 35.2 g/dL (ref 32.0–36.0)
MONO#: 0.2 10*3/uL (ref 0.1–0.9)
NEUT%: 82.3 % — ABNORMAL HIGH (ref 39.0–75.0)
Platelets: 146 10*3/uL (ref 140–400)
WBC: 4 10*3/uL (ref 4.0–10.3)

## 2010-09-22 LAB — COMPREHENSIVE METABOLIC PANEL
ALT: 14 U/L (ref 0–53)
BUN: 28 mg/dL — ABNORMAL HIGH (ref 6–23)
CO2: 21 mEq/L (ref 19–32)
Calcium: 8.7 mg/dL (ref 8.4–10.5)
Creatinine, Ser: 1 mg/dL (ref 0.40–1.50)
Glucose, Bld: 165 mg/dL — ABNORMAL HIGH (ref 70–99)
Total Bilirubin: 0.3 mg/dL (ref 0.3–1.2)

## 2010-09-29 ENCOUNTER — Encounter (HOSPITAL_BASED_OUTPATIENT_CLINIC_OR_DEPARTMENT_OTHER): Payer: Medicare Other | Admitting: Hematology and Oncology

## 2010-09-29 ENCOUNTER — Other Ambulatory Visit: Payer: Self-pay | Admitting: Hematology and Oncology

## 2010-09-29 DIAGNOSIS — Z5112 Encounter for antineoplastic immunotherapy: Secondary | ICD-10-CM

## 2010-09-29 DIAGNOSIS — C9000 Multiple myeloma not having achieved remission: Secondary | ICD-10-CM

## 2010-09-29 LAB — CBC WITH DIFFERENTIAL/PLATELET
Basophils Absolute: 0 10*3/uL (ref 0.0–0.1)
Eosinophils Absolute: 0 10*3/uL (ref 0.0–0.5)
HCT: 29.7 % — ABNORMAL LOW (ref 38.4–49.9)
HGB: 10.5 g/dL — ABNORMAL LOW (ref 13.0–17.1)
LYMPH%: 12.8 % — ABNORMAL LOW (ref 14.0–49.0)
MONO#: 0.4 10*3/uL (ref 0.1–0.9)
NEUT#: 4 10*3/uL (ref 1.5–6.5)
NEUT%: 78 % — ABNORMAL HIGH (ref 39.0–75.0)
Platelets: 143 10*3/uL (ref 140–400)
WBC: 5.1 10*3/uL (ref 4.0–10.3)
lymph#: 0.6 10*3/uL — ABNORMAL LOW (ref 0.9–3.3)

## 2010-10-06 ENCOUNTER — Other Ambulatory Visit: Payer: Self-pay | Admitting: Hematology and Oncology

## 2010-10-06 ENCOUNTER — Encounter (HOSPITAL_BASED_OUTPATIENT_CLINIC_OR_DEPARTMENT_OTHER): Payer: Medicare Other | Admitting: Hematology and Oncology

## 2010-10-06 DIAGNOSIS — Z5112 Encounter for antineoplastic immunotherapy: Secondary | ICD-10-CM

## 2010-10-06 DIAGNOSIS — C9000 Multiple myeloma not having achieved remission: Secondary | ICD-10-CM

## 2010-10-06 LAB — CBC WITH DIFFERENTIAL/PLATELET
BASO%: 0.4 % (ref 0.0–2.0)
Basophils Absolute: 0 10*3/uL (ref 0.0–0.1)
EOS%: 0.3 % (ref 0.0–7.0)
HCT: 30.3 % — ABNORMAL LOW (ref 38.4–49.9)
HGB: 10.5 g/dL — ABNORMAL LOW (ref 13.0–17.1)
LYMPH%: 16.9 % (ref 14.0–49.0)
MCH: 38.6 pg — ABNORMAL HIGH (ref 27.2–33.4)
MCHC: 34.7 g/dL (ref 32.0–36.0)
NEUT%: 63.2 % (ref 39.0–75.0)
Platelets: 129 10*3/uL — ABNORMAL LOW (ref 140–400)
lymph#: 0.7 10*3/uL — ABNORMAL LOW (ref 0.9–3.3)

## 2010-10-12 LAB — DIFFERENTIAL
Basophils Absolute: 0 10*3/uL (ref 0.0–0.1)
Basophils Relative: 1 % (ref 0–1)
Eosinophils Relative: 1 % (ref 0–5)
Lymphocytes Relative: 29 % (ref 12–46)
Monocytes Absolute: 0.6 10*3/uL (ref 0.1–1.0)
Neutro Abs: 3.7 10*3/uL (ref 1.7–7.7)

## 2010-10-12 LAB — CBC
HCT: 34.3 % — ABNORMAL LOW (ref 39.0–52.0)
Hemoglobin: 11.6 g/dL — ABNORMAL LOW (ref 13.0–17.0)
MCHC: 33.6 g/dL (ref 30.0–36.0)
Platelets: 162 10*3/uL (ref 150–400)
RDW: 12 % (ref 11.5–15.5)

## 2010-10-12 LAB — BONE MARROW EXAM

## 2010-10-13 ENCOUNTER — Encounter (HOSPITAL_BASED_OUTPATIENT_CLINIC_OR_DEPARTMENT_OTHER): Payer: Medicare Other | Admitting: Hematology and Oncology

## 2010-10-13 ENCOUNTER — Other Ambulatory Visit: Payer: Self-pay | Admitting: Hematology and Oncology

## 2010-10-13 DIAGNOSIS — C9000 Multiple myeloma not having achieved remission: Secondary | ICD-10-CM

## 2010-10-13 DIAGNOSIS — D649 Anemia, unspecified: Secondary | ICD-10-CM

## 2010-10-13 DIAGNOSIS — Z5112 Encounter for antineoplastic immunotherapy: Secondary | ICD-10-CM

## 2010-10-13 LAB — CBC WITH DIFFERENTIAL/PLATELET
BASO%: 0 % (ref 0.0–2.0)
EOS%: 0.8 % (ref 0.0–7.0)
LYMPH%: 26.1 % (ref 14.0–49.0)
MCH: 36.7 pg — ABNORMAL HIGH (ref 27.2–33.4)
MCHC: 33.5 g/dL (ref 32.0–36.0)
MCV: 109.3 fL — ABNORMAL HIGH (ref 79.3–98.0)
MONO%: 15.6 % — ABNORMAL HIGH (ref 0.0–14.0)
Platelets: 126 10*3/uL — ABNORMAL LOW (ref 140–400)
RBC: 2.89 10*6/uL — ABNORMAL LOW (ref 4.20–5.82)

## 2010-10-20 ENCOUNTER — Encounter (HOSPITAL_BASED_OUTPATIENT_CLINIC_OR_DEPARTMENT_OTHER): Payer: Medicare Other | Admitting: Hematology and Oncology

## 2010-10-20 ENCOUNTER — Other Ambulatory Visit: Payer: Self-pay | Admitting: Hematology and Oncology

## 2010-10-20 ENCOUNTER — Ambulatory Visit (HOSPITAL_COMMUNITY)
Admission: RE | Admit: 2010-10-20 | Discharge: 2010-10-20 | Disposition: A | Discharge status: Home / Self Care | Payer: Medicare Other | Source: Ambulatory Visit | Attending: Hematology and Oncology | Admitting: Hematology and Oncology

## 2010-10-20 DIAGNOSIS — M545 Low back pain, unspecified: Secondary | ICD-10-CM | POA: Insufficient documentation

## 2010-10-20 DIAGNOSIS — Z8579 Personal history of other malignant neoplasms of lymphoid, hematopoietic and related tissues: Secondary | ICD-10-CM

## 2010-10-20 DIAGNOSIS — C9000 Multiple myeloma not having achieved remission: Secondary | ICD-10-CM | POA: Insufficient documentation

## 2010-10-20 DIAGNOSIS — I6529 Occlusion and stenosis of unspecified carotid artery: Secondary | ICD-10-CM | POA: Insufficient documentation

## 2010-10-20 DIAGNOSIS — D649 Anemia, unspecified: Secondary | ICD-10-CM

## 2010-10-20 DIAGNOSIS — IMO0002 Reserved for concepts with insufficient information to code with codable children: Secondary | ICD-10-CM | POA: Insufficient documentation

## 2010-10-20 DIAGNOSIS — M1710 Unilateral primary osteoarthritis, unspecified knee: Secondary | ICD-10-CM | POA: Insufficient documentation

## 2010-10-20 DIAGNOSIS — Z5112 Encounter for antineoplastic immunotherapy: Secondary | ICD-10-CM

## 2010-10-20 LAB — CBC WITH DIFFERENTIAL/PLATELET
BASO%: 0.3 % (ref 0.0–2.0)
LYMPH%: 24.1 % (ref 14.0–49.0)
MCHC: 34.2 g/dL (ref 32.0–36.0)
MONO#: 0.9 10*3/uL (ref 0.1–0.9)
Platelets: 126 10*3/uL — ABNORMAL LOW (ref 140–400)
RBC: 2.94 10*6/uL — ABNORMAL LOW (ref 4.20–5.82)
WBC: 3.3 10*3/uL — ABNORMAL LOW (ref 4.0–10.3)
lymph#: 0.8 10*3/uL — ABNORMAL LOW (ref 0.9–3.3)

## 2010-10-22 LAB — KAPPA/LAMBDA LIGHT CHAINS: Kappa:Lambda Ratio: 1.71 — ABNORMAL HIGH (ref 0.26–1.65)

## 2010-10-22 LAB — SPEP & IFE WITH QIG
Gamma Globulin: 25.1 % — ABNORMAL HIGH (ref 11.1–18.8)
IgA: 31 mg/dL — ABNORMAL LOW (ref 68–378)
IgG (Immunoglobin G), Serum: 2290 mg/dL — ABNORMAL HIGH (ref 694–1618)
IgM, Serum: 23 mg/dL — ABNORMAL LOW (ref 60–263)
M-Spike, %: 1.61 g/dL

## 2010-10-22 LAB — COMPREHENSIVE METABOLIC PANEL
ALT: 17 U/L (ref 0–53)
CO2: 22 mEq/L (ref 19–32)
Sodium: 140 mEq/L (ref 135–145)
Total Bilirubin: 0.3 mg/dL (ref 0.3–1.2)
Total Protein: 7.2 g/dL (ref 6.0–8.3)

## 2010-10-22 LAB — LACTATE DEHYDROGENASE: LDH: 166 U/L (ref 94–250)

## 2010-10-28 ENCOUNTER — Encounter (HOSPITAL_BASED_OUTPATIENT_CLINIC_OR_DEPARTMENT_OTHER): Payer: Medicare Other | Admitting: Hematology and Oncology

## 2010-10-28 DIAGNOSIS — C9000 Multiple myeloma not having achieved remission: Secondary | ICD-10-CM

## 2010-10-28 DIAGNOSIS — Z5112 Encounter for antineoplastic immunotherapy: Secondary | ICD-10-CM

## 2010-12-04 NOTE — Op Note (Signed)
   NAME:  John, Carter NO.:  [id number]   MEDICAL RECORD NO.:  [id number]                   PATIENT TYPE:  AMB   LOCATION:  ENDO                                 FACILITY:  [facility]   PHYSICIAN:  Danise Edge, M.D.                DATE OF BIRTH:  1930-01-04   DATE OF PROCEDURE:  04/29/2003  DATE OF DISCHARGE:                                 OPERATIVE REPORT   PROCEDURE:  Screening colonoscopy.   PROCEDURE INDICATION:  [name] is a 77 year old male, born [birth year].  [name] underwent a screening colonoscopy with removal of  colon polyps in 1999.  He is due for surveillance colonoscopy with  polypectomy to prevent colon cancer.   ENDOSCOPIST:  Charolett Bumpers, M.D.   PREMEDICATION:  1. Versed 3 mg.  2. Demerol 50 mg.   DESCRIPTION OF PROCEDURE:  After obtaining informed consent, [name]  was placed in the left lateral decubitus position.  I administered  intravenous Demerol and intravenous Versed to achieve conscious sedation for  the procedure.  The patient's blood pressure, oxygen saturation, and cardiac  rhythm were monitored throughout the procedure and documented in the medical  record.   Anal inspection was normal.  Digital rectal exam was normal.  The prostate  was nonnodular.  The Olympus pediatric colonoscope was introduced into the  rectum and advanced to the cecum.  Colonic preparation for the exam today  was excellent.   [name] has universal colonic diverticulosis without diverticulitis or  diverticular stricture formation.   RECTUM:  Normal.  SIGMOID COLON AND DESCENDING COLON:  Normal.  SPLENIC FLEXURE:  Normal.  TRANSVERSE COLON:  Normal.  HEPATIC FLEXURE:  Normal.  ASCENDING COLON:  Normal.  CECUM AND ILEOCECAL VALVE:  Normal.    ASSESSMENT:  Universal colonic diverticulosis; otherwise normal  proctocolonoscopy to the cecum.  No endoscopic evidence for the presence of  colorectal  neoplasia.   RECOMMENDATIONS:  Repeat colonoscopy in five years.                                               Danise Edge, M.D.    MJ/MEDQ  D:  04/29/2003  T:  04/29/2003  Job:  [id number]   cc:   [name], M.D.  [address] [place]  [place] [address]  Fax: [phone]   Lyn Records III, M.D.  [address]  [place]  [place] [address]  Fax: [phone]

## 2010-12-04 NOTE — H&P (Signed)
[place]. [facility]  Patient:    John Carter, John Carter                  MRN: [id number] Adm. Date:  [id number] Attending:  Lyn Records. Iii Dictator:   Anselm Lis, N.P.                         History and Physical  DATE OF BIRTH:  [birth year]  HISTORY OF PRESENT ILLNESS:   [name] is a pleasant 77 year old gentleman with a history of dyslipidemia, hypertension, known coronary artery disease, with a long stent placed in the LAD, some 10 months earlier.  He awoke early this morning at 4:30 a.m. with "a Charley horse/catching" pain in the right anterior chest, lasting for approximately two hours.  It seemed if he took a deep breath that it was more of a catching-type discomfort.  This discomfort slowly eased off.  Then he had the subsequent occurrence of bilateral apical shoulder achiness which has gradually eased off, but persists.  He presented to the [facility] Emergency Room where an electrocardiogram revealed no significant change from the baseline; however, at about 8:10 a.m. he developed epigastric/upper abdominal pressure, rated at an 8/10, with associated shortness of breath and malaise.  An electrocardiogram reveled evidence of evolving inferior changes, consistent with inferior injury.  He was taken urgently for a coronary angiography.  While in the emergency room he was given the following medications: 1. IV heparin and 500o units. 2. IV nitrate. 3. Two sublingual nitrates. 4. Plavix 150 mg. 5. Aspirin 325 mg p.o.  PAST MEDICAL HISTORY: 1. Coronary atherosclerotic heart disease:    a. In February 2001, a stent (long) LAD.  Residual 70% diagonal-I.       The circumflex and RCA were okay. 2. Hypertension. 3. Dyslipidemia. 4. Appendectomy. 5. Remote history of GI bleed. 6. Hiatal hernia diagnosed earlier this year, question of upper GI. 7. Usage of nocturnal CPAP.  ALLERGIES:  SULFA.  CURRENT MEDICATIONS: 1.  Diovan 80 mg p.o. q.d. 2. Toprol XL 50 mg q.d. 3. Lipitor 10 mg q.d. 4. Enteric-coated aspirin 325 mg q.d. 5. Flonase. 6. Prilosec 20 mg p.o. q.d.  SOCIAL HISTORY:  HABITS:  Tobacco:  Negative.  ETOH:  Negative.  FAMILY HISTORY:  Positive for coronary artery disease.  REVIEW OF SYSTEMS:  As in the HPI and the past medical history, otherwise essentially benign.  He is quite active around the house.  No exertional anginal symptoms.  PHYSICAL EXAMINATION:  (As performed by Dr. Darci Needle III):  VITAL SIGNS:  Blood pressure 179/86, heart rate 76 and regular, respirations 20, O2 saturation 98%, temperature 97.8 degrees.  GENERAL:  He is a well-nourished, 77 year old gentleman, in moderate distress. His wife is at his bedside.  HEENT/NECK:  Brisk bilateral carotid upstroke without bruits.  No significant jugular venous distention.  No thyromegaly.  CHEST:  Lung sounds clear.  Equal bilateral excursion.  CARDIAC:  A regular rate and rhythm without murmur, rub, or gallop.  Normal S1, S2.  ABDOMEN:  Soft, nondistended, normoactive bowel sounds.  Negative abdominal aortic, renal, or femoral bruits.  No masses or organomegaly appreciated.  EXTREMITIES:  Distal pulses intact.  Negative pedal edema.  NEUROLOGIC:  Cranial nerves II-XII grossly intact.  Nonfocal examination.  GENITOURINARY:  Deferred.  RECTAL:  Deferred.  LABORATORY DATA:  Chest x-ray revealed mild left base atelectasis or scar, otherwise  no active disease.  CPK 186, MB fraction 4.5, troponin pending.  WBC 12.2, hemoglobin 13.6, hematocrit 38.4, platelets 198.  Sodium 141, K of 4.6, chloride 109, CO2 of 28, BUN 20, creatinine 1.2, glucose 113.   Pro time 12.1, INR 0.9, PTT 27.  Electrocardiogram revealed initial normal sinus rhythm with slight ST elevation inferior, which was approximately his baseline.  He developed evolving ST elevation inferiorly with serial electrocardiograms, associated with  intensification of chest pain.  IMPRESSION:  (As per Dr. [name]): 1. Acute inferior myocardial infarction versus question early localized    pericarditis in this 77 year old gentleman with known left anterior anterior descending coronary artery disease, status post long stent    to the left anterior descending coronary artery 10 months earlier. 2. Hypertension. 3. Dyslipidemia.  PLAN:  (As dictated by Dr. Darci Needle III):  A coronary angiography with possible percutaneous intervention.  The nature of the procedure, the risks, potential complications, and benefits were reviewed.  The patient and his wife are agreeable to proceed, and indicated that their questions and concerns have been addressed. DD:  07/06/00 TD:  07/06/00 Job: [id number]

## 2010-12-04 NOTE — Cardiovascular Report (Signed)
[place]. [facility]  Patient:    John Carter, John Carter                  MRN: [id number] Proc. Date: 07/06/00 Adm. Date:  [id number] Attending:  Lyn Records. Iii                        Cardiac Catheterization  DATE OF BIRTH:  1930/06/18  REFERRING PHYSICIAN:  Winn Jock. Earl Gala, M.D.  INDICATIONS FOR PROCEDURE:  Acute prolonged chest pain with inferior minimal ST elevated in patient with known coronary artery disease and prior LAD stent.  PROCEDURES PERFORMED: 1. Left heart catheterization. 2. Selective coronary angiography. 3. Left ventriculography.  DESCRIPTION OF PROCEDURE:  After informed consent, a 6 French sheath was started in the right femoral artery using a modified Seldinger technique.  A 6 French A2 multipurpose catheter was usedfor hemodynamic recordings, left ventriculography, and selective left and right coronary angiography.  The patient tolerated the procedure without complications.  RESULTS:  I:  HEMODYNAMIC DATA:     a. The aortic pressure was 128/63 mmHg.     b. Left ventricular pressure 128/19 mmHg.  II:  LEFT VENTRICULOGRAPHY:  Left ventriculography by hand injection.  The LV function was normal.  No regional wall motion abnormalities noted in the RAO projection.No mitral regurgitation was noted.  III:  SELECTIVE CORONARY ANGIOGRAPHY:     a. Left main:  The left main coronary artery is widely patent.     b. Left anterior descending coronary artery:  The LAD is heavily        calcified.  There is a 20 cm or greater stent in the left anterior        descending coronary.  These are overlapping 13 and 28mm long        stents.  The second diagonal, just prior to the beginningof the        stented segment contains a 90% stenosis.  This lesion predated        the patients present situation.  The first diagonal also contains        high-grade proximal and ostial narrowing.  The stented region contains        mild to moderate  in-stent re-stenosis but no high-grade obstruction.     c. Circumflex artery:  The circumflex artery is large.  Nosignificant        abnormalities are noted.  There is one large branching obtuse marginal        that supplies the mid lateral wall that is free of obstruction.     d. Right coronary artery:  The right coronary artery is a large vessel        that contains moderate calcification in the mid vessel.  The PDA is        large, and there are two left ventricular branches.  Irregularities in        the proximal and mid PDA are significant and graded to be approximately        70%.  There is a new severe obstruction between the first and second        left ventricular branch in the continuation of the right coronary        that probably represents the origin of the patients current symptoms.        Because of the distal location of these blockages and there presence at  branch sites, I have chosen to attempt to manage this conservatively        with medications.  CONCLUSIONS: 1. Significant distal coronary disease involving branch sites, including    severe obstruction in the first and second diagonal, moderately severe    proximal and mid posterior descending artery obstruction and severe    continuation of the right coronary artery between the first and second    left ventricular branch stenosis, that is probably the culprit lesion. 2. Normal left ventricular function.  PLAN:  Aggrastat x 5, 24-36 hours, Plavix.  Medical therapy to include nitrates and beta blockers.  Will contemplate distal right coronary and PDA intervention if recurrent symptoms force [place] to do so. DD: 07/06/00 TD:  07/07/00 Job: [id number]

## 2010-12-04 NOTE — Discharge Summary (Signed)
[place]. [facility]  Patient:    John Carter, John Carter                  MRN: [id number] Adm. Date:  [id number] Disc. Date: [id number] Attending:  Lyn Records. Iii Dictator:   Anselm Lis, N.P. CC:         Winn Jock. Earl Gala, M.D.   Discharge Summary  PRIMARY CARE PHYSICIAN:  Winn Jock. Earl Gala, M.D.  PROCEDURES: A. (07/06/00) Cardiac catheterization revealing normal LV with EF greater than    60%.  Severe side branch disease in diagonal #1, diagonal #2, and RCA LV    branch as well as PDA, patent LAD stent. B. 2-D echocardiogram which was negative for evidence of pericarditis.  DISCHARGE DIAGNOSES: 1. Chest pain; some typical and atypical features for coronary artery disease.    Cardiac catheterization revealed high-grade side branch disease in diagonal    1, diagonal 2, right coronary artery left ventricular branch and posterior    descending artery with patent left anterior descending stent.  Normal    ejection fraction.  Presenting electrocardiogram with inferior entry    pattern which persisted postcatheterization, though cardiac enzymes were    negative.  There is a question of some possible localized pericarditis    causing injury changes on EKG versus from culprit stenotic lesions.  There    was a somewhat pleuritic component of chest discomfort; may need to    consider gallbladder ultrasound if recurrent symptoms (will follow up with    Dr. Earl Gala on this).  He spiked a temperature up to 101.2 on hospital day    #1; afebrile at the time of discharge.    Chest x-ray with scar-like density in the left base which was unchanged    from prior studies. 2. Dyslipidemia ; on Lipitor management by primary care. 3. History of hiatal hernia; on Prilosec.  PLAN:  The patient discharged home in stable condition.  DISCHARGE MEDICATIONS: A. Diovan 80 mg p.o. q.d. B. Toprol XL 50 mg p.o. q.d. C. Lipitor 10 mg p.o. q.d. D. Enteric-coated aspirin 325 mg once  daily. E. Flonase, as before. F. Prilosec 20 mg p.o. q.d. G. Nitroglycerin lingual spray 0.4 mg on or under tongue p.r.n. chest pain.    Repeat every 5 minutes up to 3 sprays in 15 minutes.  ACTIVITY:  As before.  DIET:  Low fat, low cholesterol.  WOUND CARE:  May shower.  SPECIAL INSTRUCTIONS:  Call our clinic if he develops a large amount of swelling or bruising in groin area.  FOLLOW-UP:  With Dr. [name], Friday, August 06, 1999, at 11:15 a.m.  The patient will make an appointment to see Dr. Earl Gala within the next two weeks.  BRIEF HOSPITAL COURSE:  John Carter is a 77 year old gentleman with a history of dyslipidemia, hypertension, known CAD; status post long stent placement in the LAD some 10 minutes earlier.  He awoke early the morning of admission with a "charlie horse/catching" pain in his right anterior chest, lasting approximately two hours.  It seemed that if he took a deep breath, it was more of a catching-type discomfort.  It slowly eased off, and had a subsequent occurrence of bilateral top of his shoulder achiness which gradually eased off but persists.  He was seen at the [facility] Emergency Room where EKG was without significant change in baseline.  However, about 8:10 that morning, he developed epigastric/upper abdominal pressure rating it as an 8/10 with associated  shortness of breath and malaise.  An EKG revealed evidence of acute injury pattern inferiorly.  He was taken urgently for coronary angiography. He was treated in emergency with IV heparin, nitrates, two sublingual nitrates, Plavix, and aspirin.  CARDIAC CATHETERIZATION RESULTS: 1. Left ventriculogram:  Normal with EF greater than 60%. 2. Coronary angiography:    A. Left vein:  Normal.    B. LAD:  Segmental narrowing within mid stent approximately 50%.  No       high-grade disease.  Diagonal #1 and #2 are tight which is chronic.    C. Circumflex:  Normal.    D. RCA:  95% after first LV  branch, sequential 70% stenosis with TIMI II+       flow.  It was the impression of Dr. Katrinka Blazing that the patient had severe       side branch disease in diagonal 1 and diagonal 2 as well as in the RCA       LV branch and PDA.  The LAD stent placed 10 months earlier was patent.       The patient was initiated on IV Aggrastat within catheterization lab and       continued for approximately 36 hours infusion.  That day, the patient       had a slight temperature of 100.4.  The next morning, his T-max was       101.2, then afebrile after Tylenol.  He complained of a transient right       lower chest soreness and also a transient top of right shoulder       achiness.  His white blood cell count on admission was 12.2; peak of 12.4, 8.9 at time of discharge.  Dr. Katrinka Blazing was suspicious that the blockage in his coronary arteries may not be the etiology of his atypical symptoms.  He wondered if the ST elevation inferiorly could be related to a focal pericarditis.  A 2-D echocardiogram was obtained but was negative for signs of pericarditis; negative effusion.  The patient had been given ibuprofen on July 06, 2000. This was discontinued the next day.  The day of discharge, the patient was pain-free, and his EKG was without ST changes.  He ruled out for MI by negative serial cardiac enzymes.  He was discharged home with plans for a gallbladder ultrasound in the future if recurrent symptoms (will have him follow up with Dr. [name]).  Continue with p.r.n. nitrates for anginal symptoms.  Otherwise ask for discharge summary sheet.  LABORATORY TESTS AND DATA:  Chest x-ray from December 19, revealed scarring of the left base; no active disease.  Admission WBC 12.2; 8.9 at time of discharge.  Admission hemoglobin 13.6; 12.3 at the time of discharge. Hematocrit 38.8, platelets 198.  Admission coagulation studies were within normal range.  Sodium 141, potassium 4.6, chloride 109, glucose 113, BUN  20, creatinine 1.2.  Admission CK 186 and MB fraction of 4.5, and troponin less  than 0.01.  Second CK of 113 with MB fraction 2.4.  Third CK of 105 with MB fraction 1.5, troponin I 0.02.  A follow-up troponin I was 0.02.  E wave was negative.  Admission EKG revealed inferior ST elevation changes which resolved during the course of admission.  PAST MEDICAL HISTORY: 1. Coronary arteriosclerotic heart disease.    A. In February 2001, a stent (long) LAD.  Residual of 70% diagonal 1.       Circumflex and RCA were okay. 2. Hypertension. 3. Dyslipidemia.  4. Appendectomy. 5. Remote history of GI bleed. 6. Hiatal hernia diagnosed earlier this year, I believe by upper GI. 7. Usage of nocturnal CPAP. DD:  07/08/00 TD:  07/10/00 Job: [id number]

## 2011-03-02 ENCOUNTER — Encounter (HOSPITAL_BASED_OUTPATIENT_CLINIC_OR_DEPARTMENT_OTHER): Payer: Medicare Other | Admitting: Hematology and Oncology

## 2011-03-02 ENCOUNTER — Other Ambulatory Visit: Payer: Self-pay | Admitting: Hematology and Oncology

## 2011-03-02 DIAGNOSIS — C9000 Multiple myeloma not having achieved remission: Secondary | ICD-10-CM

## 2011-03-02 DIAGNOSIS — D649 Anemia, unspecified: Secondary | ICD-10-CM

## 2011-03-02 LAB — CBC WITH DIFFERENTIAL/PLATELET
Basophils Absolute: 0 10*3/uL (ref 0.0–0.1)
Eosinophils Absolute: 0 10*3/uL (ref 0.0–0.5)
LYMPH%: 20.1 % (ref 14.0–49.0)
MCHC: 34.4 g/dL (ref 32.0–36.0)
MONO%: 11.5 % (ref 0.0–14.0)
NEUT%: 67.3 % (ref 39.0–75.0)
RBC: 2.87 10*6/uL — ABNORMAL LOW (ref 4.20–5.82)
WBC: 4.8 10*3/uL (ref 4.0–10.3)
lymph#: 1 10*3/uL (ref 0.9–3.3)

## 2011-03-02 LAB — COMPREHENSIVE METABOLIC PANEL
Albumin: 3.5 g/dL (ref 3.5–5.2)
Alkaline Phosphatase: 111 U/L (ref 39–117)
BUN: 27 mg/dL — ABNORMAL HIGH (ref 6–23)
Creatinine, Ser: 0.95 mg/dL (ref 0.50–1.35)
Glucose, Bld: 123 mg/dL — ABNORMAL HIGH (ref 70–99)
Potassium: 3.6 mEq/L (ref 3.5–5.3)

## 2011-03-04 LAB — KAPPA/LAMBDA LIGHT CHAINS
Kappa free light chain: 2.16 mg/dL — ABNORMAL HIGH (ref 0.33–1.94)
Lambda Free Lght Chn: 1.08 mg/dL (ref 0.57–2.63)

## 2011-03-04 LAB — SPEP & IFE WITH QIG
Albumin ELP: 49.5 % — ABNORMAL LOW (ref 55.8–66.1)
Alpha-1-Globulin: 4.3 % (ref 2.9–4.9)
Alpha-2-Globulin: 10.1 % (ref 7.1–11.8)
Beta 2: 2.8 % — ABNORMAL LOW (ref 3.2–6.5)
Beta Globulin: 5.1 % (ref 4.7–7.2)
IgM, Serum: 19 mg/dL — ABNORMAL LOW (ref 41–251)

## 2011-03-04 LAB — BETA 2 MICROGLOBULIN, SERUM: Beta-2 Microglobulin: 2.82 mg/L — ABNORMAL HIGH (ref 1.01–1.73)

## 2011-03-10 ENCOUNTER — Encounter (HOSPITAL_BASED_OUTPATIENT_CLINIC_OR_DEPARTMENT_OTHER): Payer: Commercial Managed Care - PPO | Admitting: Hematology and Oncology

## 2011-03-10 DIAGNOSIS — C9000 Multiple myeloma not having achieved remission: Secondary | ICD-10-CM

## 2011-03-10 DIAGNOSIS — Z5112 Encounter for antineoplastic immunotherapy: Secondary | ICD-10-CM

## 2011-03-31 ENCOUNTER — Emergency Department (HOSPITAL_COMMUNITY)
Admission: EM | Admit: 2011-03-31 | Discharge: 2011-03-31 | Disposition: A | Discharge status: Home / Self Care | Payer: Medicare Other | Attending: Emergency Medicine | Admitting: Emergency Medicine

## 2011-03-31 DIAGNOSIS — I10 Essential (primary) hypertension: Secondary | ICD-10-CM | POA: Insufficient documentation

## 2011-03-31 DIAGNOSIS — E7800 Pure hypercholesterolemia, unspecified: Secondary | ICD-10-CM | POA: Insufficient documentation

## 2011-03-31 DIAGNOSIS — R220 Localized swelling, mass and lump, head: Secondary | ICD-10-CM | POA: Insufficient documentation

## 2011-03-31 DIAGNOSIS — H6690 Otitis media, unspecified, unspecified ear: Secondary | ICD-10-CM | POA: Insufficient documentation

## 2011-03-31 DIAGNOSIS — H9209 Otalgia, unspecified ear: Secondary | ICD-10-CM | POA: Insufficient documentation

## 2011-05-20 ENCOUNTER — Telehealth: Payer: Self-pay | Admitting: Hematology and Oncology

## 2011-05-20 NOTE — Telephone Encounter (Signed)
S/w pt today re new appt d/t's for 1/11 + 1/18. appts moved from dec due to EPIC.

## 2011-06-14 ENCOUNTER — Encounter (HOSPITAL_COMMUNITY): Payer: Self-pay | Admitting: Pharmacy Technician

## 2011-06-15 ENCOUNTER — Other Ambulatory Visit: Payer: Self-pay | Admitting: Physician Assistant

## 2011-06-15 ENCOUNTER — Encounter: Payer: Self-pay | Admitting: Physician Assistant

## 2011-06-15 NOTE — H&P (Signed)
John Carter is an 77 y.o. male.   Chief Complaint: Left knee end stage djd HPI: John Carter is a pleasant 77 year-old male patient who comes in today for follow up on his bilateral knee pain.  The left knee hurts worse than the right knee.  He was seen previously on February 02, 2011 and he was treated with bilateral Cortisone injections in both knees, which gave him relief for 2 months.  He states that his knee pain got worse about 2 weeks ago.  The left knee is worse than the right knee.  The pain is excruciating and severe, 10/10.  He hasn't slept in the last two nights due to the knee pain.  The pain is sharp, burning pain.  Radiating distally to his leg.  No numbness.  No tingling.  The pain is worse with weight bearing activities, improved sometimes with rest.  He is taking Tramadol for pain which helps some.  The pain in the right knee is less intense than the left knee with the same characteristics.  He denies any numbness or tingling distally.    Past Medical History  Diagnosis Date  . Coronary artery disease   . Hypertension   . GERD (gastroesophageal reflux disease)   . Angina   . Multiple myeloma 06/2009  . Stroke   . Arthritis   . Myocardial infarction   . Hiatal hernia   . Sleep apnea     uses CPAP settings 10    Past Surgical History  Procedure Date  . Coronary angioplasty   . Back surgery   . Tonsillectomy   . Colon surgery   . Breast surgery   . Diagnostic laparoscopy   . Eye surgery     glass removed from eye    History reviewed. No pertinent family history. Social History:  reports that he has never smoked. He does not have any smokeless tobacco history on file. He reports that he does not drink alcohol or use illicit drugs.  Allergies:  Allergies  Allergen Reactions  . Sulfa Drugs Cross Reactors Rash    Medications Prior to Admission  Medication Sig Dispense Refill  . acetaminophen (TYLENOL) 500 MG tablet Take 1,000 mg by mouth every 6 (six) hours  as needed. For pain       . amLODipine (NORVASC) 5 MG tablet Take 5 mg by mouth daily.        Marland Kitchen aspirin 325 MG EC tablet Take 325 mg by mouth daily.        . cetirizine-pseudoephedrine (ZYRTEC-D) 5-120 MG per tablet Take 1 tablet by mouth 2 (two) times daily.        . clopidogrel (PLAVIX) 75 MG tablet Take 75 mg by mouth daily.        . flunisolide (NASAREL) 29 MCG/ACT (0.025%) nasal spray Place 2 sprays into the nose at bedtime. Dose is for each nostril.       Marland Kitchen glucosamine-chondroitin 500-400 MG tablet Take 1 tablet by mouth daily.        . metoprolol (TOPROL-XL) 50 MG 24 hr tablet Take 50 mg by mouth daily.        . Multiple Vitamins-Minerals (MULTIVITAMINS THER. W/MINERALS) TABS Take 1 tablet by mouth daily.        Marland Kitchen omeprazole (PRILOSEC) 20 MG capsule Take 20 mg by mouth daily.        . simvastatin (ZOCOR) 20 MG tablet Take 20 mg by mouth at bedtime.        Marland Kitchen  traMADol (ULTRAM) 50 MG tablet Take 50 mg by mouth every 6 (six) hours as needed. For knee pain Maximum dose= 8 tablets per day       . valsartan (DIOVAN) 320 MG tablet Take 320 mg by mouth daily.        . Vitamin D, Ergocalciferol, (DRISDOL) 50000 UNITS CAPS Take 100,000 Units by mouth every 30 (thirty) days.        . nitroGLYCERIN (NITROSTAT) 0.4 MG SL tablet Place 0.4 mg under the tongue every 5 (five) minutes as needed. For chest pain        No current facility-administered medications on file as of 06/15/2011.    No results found for this or any previous visit (from the past 48 hour(s)). No results found.  Review of Systems  Constitutional: Negative.   HENT: Negative.   Eyes: Negative.   Respiratory: Negative.   Cardiovascular: Negative.   Gastrointestinal: Negative.   Genitourinary: Negative.   Musculoskeletal: Positive for joint pain.       Left knee  Skin: Negative.   Neurological: Negative.   Endo/Heme/Allergies: Negative.   Psychiatric/Behavioral: Negative.     Blood pressure 151/77, pulse 77, temperature  98.1 F (36.7 C), temperature source Oral, resp. rate 16, height 5\' 5"  (1.651 m), weight 88.451 kg (195 lb), SpO2 95.00%. Physical Exam  Constitutional: He appears well-developed and well-nourished.  HENT:  Head: Normocephalic and atraumatic.  Mouth/Throat: Oropharynx is clear and moist.  Eyes: Conjunctivae and EOM are normal. Pupils are equal, round, and reactive to light.  Neck: Normal range of motion. Neck supple.  Cardiovascular: Normal rate, regular rhythm, normal heart sounds and intact distal pulses.   Respiratory: Effort normal and breath sounds normal.  GI: Soft. Bowel sounds are normal.  Genitourinary:       Not pertinent to current symptomatology therefore not examined.   Musculoskeletal:       Musculoskeletal: Left knee with intact skin.  Range of motion extension 0 degrees, flexion 110 degrees.  There is severe patellofemoral crepitus.  Ligaments are intact.  There is tenderness to palpation in the medial joint line.  Sensation is intact distally.  Right knee with intact skin.  No swelling.  No hematoma.  There is patellofemoral crepitus, less severe than in the left knee.  There is tenderness to palpation in the mid joint line.  Ligaments are intact.       Assessment Past Medical History  Diagnosis Date  . Coronary artery disease   . Hypertension   . GERD (gastroesophageal reflux disease)   . Angina   . Multiple myeloma 06/2009  . Stroke   . Arthritis   . Myocardial infarction   . Hiatal hernia   . Sleep apnea     uses CPAP settings 10   Plan He has a very high surgical risk due to his multiple co-morbidities.  He has been evaluated and cleared by Dr. Garnette Scheuermann, his cardiologist, Dr. [name], his primary care physician, Dr. Caryn Section, Dr [name], and by his oncologist, Dr. Dalene Carrow.  The risks, benefits, and possible complications of the procedure were discussed in detail with the patient.  The patient is without question.  Mikaela Hilgeman J 06/15/2011, 3:45  PM

## 2011-06-21 NOTE — Pre-Procedure Instructions (Signed)
20 MARCELL CHAVARIN  06/21/2011   Your procedure is scheduled on: Mon,Dec 10 @ 0715 Report to [facility] at 0530 AM.  Call this number if you have problems the morning of surgery: [phone]   Remember:   Do not eat food:After Midnight.  May have clear liquids: up to 4 Hours before arrival.(until 1:30am)  Clear liquids include soda, tea, black coffee, apple or grape juice, broth.  Take these medicines the morning of surgery with A SIP OF WATER: Amlodipine,Zyrtec,Nasal Spray,Metoprolol,Tramadol,and Omeprazole   Do not wear jewelry, make-up or nail polish.  Do not wear lotions, powders, or perfumes. You may wear deodorant.  Do not shave 48 hours prior to surgery.  Do not bring valuables to the hospital.  Contacts, dentures or bridgework may not be worn into surgery.  Leave suitcase in the car. After surgery it may be brought to your room.  For patients admitted to the hospital, checkout time is 11:00 AM the day of discharge.   Patients discharged the day of surgery will not be allowed to drive home.  Name and phone number of your driver:    Special Instructions: CHG Shower Use Special Wash: 1/2 bottle night before surgery and 1/2 bottle morning of surgery.   Please read over the following fact sheets that you were given: Pain Booklet, Coughing and Deep Breathing, Blood Transfusion Information, Total Joint Packet, MRSA Information and Surgical Site Infection Prevention

## 2011-06-22 ENCOUNTER — Ambulatory Visit (HOSPITAL_COMMUNITY)
Admission: RE | Admit: 2011-06-22 | Discharge: 2011-06-22 | Disposition: A | Discharge status: Home / Self Care | Payer: Medicare Other | Source: Ambulatory Visit | Attending: Physician Assistant | Admitting: Physician Assistant

## 2011-06-22 ENCOUNTER — Encounter (HOSPITAL_COMMUNITY): Payer: Self-pay

## 2011-06-22 ENCOUNTER — Encounter (HOSPITAL_COMMUNITY)
Admission: RE | Admit: 2011-06-22 | Discharge: 2011-06-22 | Disposition: A | Discharge status: Home / Self Care | Payer: Medicare Other | Source: Ambulatory Visit | Attending: Orthopedic Surgery | Admitting: Orthopedic Surgery

## 2011-06-22 DIAGNOSIS — Z01812 Encounter for preprocedural laboratory examination: Secondary | ICD-10-CM | POA: Insufficient documentation

## 2011-06-22 DIAGNOSIS — Z01818 Encounter for other preprocedural examination: Secondary | ICD-10-CM | POA: Insufficient documentation

## 2011-06-22 HISTORY — DX: Cramp and spasm: R25.2

## 2011-06-22 HISTORY — DX: Diverticulosis of intestine, part unspecified, without perforation or abscess without bleeding: K57.90

## 2011-06-22 HISTORY — DX: Transient cerebral ischemic attack, unspecified: G45.9

## 2011-06-22 HISTORY — DX: Frequency of micturition: R35.0

## 2011-06-22 HISTORY — DX: Personal history of colonic polyps: Z86.010

## 2011-06-22 HISTORY — DX: Other chronic pain: G89.29

## 2011-06-22 HISTORY — DX: Hyperlipidemia, unspecified: E78.5

## 2011-06-22 HISTORY — DX: Dorsalgia, unspecified: M54.9

## 2011-06-22 HISTORY — DX: Spontaneous ecchymoses: R23.3

## 2011-06-22 HISTORY — DX: Unspecified cataract: H26.9

## 2011-06-22 HISTORY — DX: Personal history of colon polyps, unspecified: Z86.0100

## 2011-06-22 HISTORY — DX: Other skin changes: R23.8

## 2011-06-22 HISTORY — DX: Malignant melanoma of skin, unspecified: C43.9

## 2011-06-22 HISTORY — DX: Nocturia: R35.1

## 2011-06-22 LAB — DIFFERENTIAL
Eosinophils Relative: 1 % (ref 0–5)
Lymphocytes Relative: 29 % (ref 12–46)
Lymphs Abs: 1.5 10*3/uL (ref 0.7–4.0)
Neutro Abs: 3.2 10*3/uL (ref 1.7–7.7)

## 2011-06-22 LAB — CBC
Hemoglobin: 11.1 g/dL — ABNORMAL LOW (ref 13.0–17.0)
MCH: 35.7 pg — ABNORMAL HIGH (ref 26.0–34.0)
MCHC: 33.4 g/dL (ref 30.0–36.0)
Platelets: 180 10*3/uL (ref 150–400)

## 2011-06-22 LAB — URINALYSIS, ROUTINE W REFLEX MICROSCOPIC
Bilirubin Urine: NEGATIVE
Glucose, UA: NEGATIVE mg/dL
Ketones, ur: NEGATIVE mg/dL
Specific Gravity, Urine: 1.016 (ref 1.005–1.030)
pH: 5 (ref 5.0–8.0)

## 2011-06-22 LAB — COMPREHENSIVE METABOLIC PANEL
ALT: 19 U/L (ref 0–53)
Calcium: 9.3 mg/dL (ref 8.4–10.5)
Creatinine, Ser: 0.96 mg/dL (ref 0.50–1.35)
GFR calc Af Amer: 89 mL/min — ABNORMAL LOW (ref >90)
Glucose, Bld: 102 mg/dL — ABNORMAL HIGH (ref 70–99)
Sodium: 140 mEq/L (ref 135–145)
Total Protein: 8.7 g/dL — ABNORMAL HIGH (ref 6.0–8.3)

## 2011-06-22 LAB — URINE MICROSCOPIC-ADD ON

## 2011-06-22 LAB — ABO/RH: ABO/RH(D): O POS

## 2011-06-22 LAB — PROTIME-INR
INR: 1.01 (ref 0.00–1.49)
Prothrombin Time: 13.5 seconds (ref 11.6–15.2)

## 2011-06-22 NOTE — Progress Notes (Signed)
Echo,stress,and ekg done in 2012

## 2011-06-22 NOTE — Progress Notes (Signed)
Dr.Hank Katrinka Blazing is cardiologist;last visit a month ago-stress test/echo/ekg;requested this information from [facility]

## 2011-06-23 LAB — URINE CULTURE
Colony Count: NO GROWTH
Culture  Setup Time: 201212040947

## 2011-06-27 MED ORDER — POVIDONE-IODINE 7.5 % EX SOLN
Freq: Once | CUTANEOUS | Status: DC
  Filled 2011-06-27: qty 118

## 2011-06-27 MED ORDER — CHLORHEXIDINE GLUCONATE 4 % EX LIQD: 60.0000 mL | Freq: Once | CUTANEOUS | Status: DC

## 2011-06-27 MED ORDER — CEFAZOLIN SODIUM-DEXTROSE 2-3 GM-% IV SOLR
2.0000 g | INTRAVENOUS | Status: AC
  Administered 2011-06-28: 2 g via INTRAVENOUS
  Filled 2011-06-27: qty 50

## 2011-06-28 ENCOUNTER — Encounter (HOSPITAL_COMMUNITY): Payer: Self-pay | Admitting: Physician Assistant

## 2011-06-28 ENCOUNTER — Inpatient Hospital Stay (HOSPITAL_COMMUNITY)
Admission: RE | Admit: 2011-06-28 | Discharge: 2011-07-01 | DRG: 470 | Disposition: A | Discharge status: Skilled Nursing Facility | Payer: Medicare Other | Source: Ambulatory Visit | Attending: Orthopedic Surgery | Admitting: Orthopedic Surgery

## 2011-06-28 ENCOUNTER — Encounter (HOSPITAL_COMMUNITY)
Admission: RE | Disposition: A | Discharge status: Skilled Nursing Facility | Payer: Self-pay | Source: Ambulatory Visit | Attending: Orthopedic Surgery

## 2011-06-28 ENCOUNTER — Inpatient Hospital Stay (HOSPITAL_COMMUNITY): Payer: Medicare Other | Admitting: Anesthesiology

## 2011-06-28 ENCOUNTER — Encounter (HOSPITAL_COMMUNITY): Payer: Self-pay | Admitting: Anesthesiology

## 2011-06-28 ENCOUNTER — Encounter (HOSPITAL_COMMUNITY): Payer: Self-pay | Admitting: *Deleted

## 2011-06-28 DIAGNOSIS — Z79899 Other long term (current) drug therapy: Secondary | ICD-10-CM

## 2011-06-28 DIAGNOSIS — Z87898 Personal history of other specified conditions: Secondary | ICD-10-CM

## 2011-06-28 DIAGNOSIS — K219 Gastro-esophageal reflux disease without esophagitis: Secondary | ICD-10-CM | POA: Diagnosis present

## 2011-06-28 DIAGNOSIS — H269 Unspecified cataract: Secondary | ICD-10-CM | POA: Insufficient documentation

## 2011-06-28 DIAGNOSIS — K5790 Diverticulosis of intestine, part unspecified, without perforation or abscess without bleeding: Secondary | ICD-10-CM | POA: Insufficient documentation

## 2011-06-28 DIAGNOSIS — G4730 Sleep apnea, unspecified: Secondary | ICD-10-CM | POA: Diagnosis present

## 2011-06-28 DIAGNOSIS — I2510 Atherosclerotic heart disease of native coronary artery without angina pectoris: Secondary | ICD-10-CM | POA: Diagnosis present

## 2011-06-28 DIAGNOSIS — M129 Arthropathy, unspecified: Secondary | ICD-10-CM | POA: Diagnosis present

## 2011-06-28 DIAGNOSIS — Z860100 Personal history of colon polyps, unspecified: Secondary | ICD-10-CM | POA: Insufficient documentation

## 2011-06-28 DIAGNOSIS — K649 Unspecified hemorrhoids: Secondary | ICD-10-CM | POA: Insufficient documentation

## 2011-06-28 DIAGNOSIS — Z7982 Long term (current) use of aspirin: Secondary | ICD-10-CM

## 2011-06-28 DIAGNOSIS — I10 Essential (primary) hypertension: Secondary | ICD-10-CM | POA: Diagnosis present

## 2011-06-28 DIAGNOSIS — M549 Dorsalgia, unspecified: Secondary | ICD-10-CM | POA: Insufficient documentation

## 2011-06-28 DIAGNOSIS — R350 Frequency of micturition: Secondary | ICD-10-CM | POA: Insufficient documentation

## 2011-06-28 DIAGNOSIS — M1710 Unilateral primary osteoarthritis, unspecified knee: Principal | ICD-10-CM | POA: Diagnosis present

## 2011-06-28 DIAGNOSIS — Z882 Allergy status to sulfonamides status: Secondary | ICD-10-CM

## 2011-06-28 DIAGNOSIS — I219 Acute myocardial infarction, unspecified: Secondary | ICD-10-CM | POA: Insufficient documentation

## 2011-06-28 DIAGNOSIS — M1990 Unspecified osteoarthritis, unspecified site: Secondary | ICD-10-CM | POA: Diagnosis present

## 2011-06-28 DIAGNOSIS — K449 Diaphragmatic hernia without obstruction or gangrene: Secondary | ICD-10-CM | POA: Insufficient documentation

## 2011-06-28 DIAGNOSIS — R351 Nocturia: Secondary | ICD-10-CM | POA: Insufficient documentation

## 2011-06-28 DIAGNOSIS — I252 Old myocardial infarction: Secondary | ICD-10-CM

## 2011-06-28 DIAGNOSIS — R252 Cramp and spasm: Secondary | ICD-10-CM | POA: Insufficient documentation

## 2011-06-28 DIAGNOSIS — E785 Hyperlipidemia, unspecified: Secondary | ICD-10-CM | POA: Diagnosis present

## 2011-06-28 DIAGNOSIS — D62 Acute posthemorrhagic anemia: Secondary | ICD-10-CM | POA: Diagnosis not present

## 2011-06-28 DIAGNOSIS — K259 Gastric ulcer, unspecified as acute or chronic, without hemorrhage or perforation: Secondary | ICD-10-CM | POA: Insufficient documentation

## 2011-06-28 DIAGNOSIS — Z85820 Personal history of malignant melanoma of skin: Secondary | ICD-10-CM

## 2011-06-28 DIAGNOSIS — Z8673 Personal history of transient ischemic attack (TIA), and cerebral infarction without residual deficits: Secondary | ICD-10-CM

## 2011-06-28 DIAGNOSIS — G459 Transient cerebral ischemic attack, unspecified: Secondary | ICD-10-CM | POA: Insufficient documentation

## 2011-06-28 DIAGNOSIS — C439 Malignant melanoma of skin, unspecified: Secondary | ICD-10-CM | POA: Insufficient documentation

## 2011-06-28 HISTORY — PX: TOTAL KNEE ARTHROPLASTY: SHX125

## 2011-06-28 SURGERY — ARTHROPLASTY, KNEE, TOTAL
Anesthesia: General | Site: Knee | Laterality: Left | Wound class: Clean

## 2011-06-28 MED ORDER — ACETAMINOPHEN 325 MG PO TABS: 650.0000 mg | ORAL_TABLET | Freq: Four times a day (QID) | ORAL | Status: DC | PRN

## 2011-06-28 MED ORDER — ONDANSETRON HCL 4 MG PO TABS: 4.0000 mg | ORAL_TABLET | Freq: Four times a day (QID) | ORAL | Status: DC | PRN

## 2011-06-28 MED ORDER — AMLODIPINE BESYLATE 5 MG PO TABS
5.0000 mg | ORAL_TABLET | Freq: Every day | ORAL | Status: DC
  Administered 2011-06-29 – 2011-07-01 (×3): 5 mg via ORAL
  Filled 2011-06-28 (×3): qty 1

## 2011-06-28 MED ORDER — METOCLOPRAMIDE HCL 10 MG PO TABS: 5.0000 mg | ORAL_TABLET | Freq: Three times a day (TID) | ORAL | Status: DC | PRN

## 2011-06-28 MED ORDER — PROMETHAZINE HCL 25 MG/ML IJ SOLN: 6.2500 mg | INTRAMUSCULAR | Status: DC | PRN

## 2011-06-28 MED ORDER — METOCLOPRAMIDE HCL 5 MG/ML IJ SOLN
5.0000 mg | Freq: Three times a day (TID) | INTRAMUSCULAR | Status: DC | PRN
  Filled 2011-06-28: qty 2

## 2011-06-28 MED ORDER — METOPROLOL SUCCINATE ER 50 MG PO TB24
50.0000 mg | ORAL_TABLET | Freq: Every day | ORAL | Status: DC
Start: 2011-06-29 — End: 2011-07-01
  Administered 2011-06-29 – 2011-07-01 (×3): 50 mg via ORAL
  Filled 2011-06-28 (×3): qty 1

## 2011-06-28 MED ORDER — OLMESARTAN MEDOXOMIL 40 MG PO TABS
40.0000 mg | ORAL_TABLET | Freq: Every day | ORAL | Status: DC
  Administered 2011-06-29 – 2011-07-01 (×3): 40 mg via ORAL
  Filled 2011-06-28 (×4): qty 1

## 2011-06-28 MED ORDER — PROPOFOL 10 MG/ML IV EMUL
INTRAVENOUS | Status: DC | PRN
  Administered 2011-06-28: 150 mg via INTRAVENOUS

## 2011-06-28 MED ORDER — FENTANYL CITRATE 0.05 MG/ML IJ SOLN
INTRAMUSCULAR | Status: DC | PRN
  Administered 2011-06-28: 50 ug via INTRAVENOUS
  Administered 2011-06-28: 25 ug via INTRAVENOUS
  Administered 2011-06-28: 50 ug via INTRAVENOUS
  Administered 2011-06-28: 25 ug via INTRAVENOUS

## 2011-06-28 MED ORDER — MEPERIDINE HCL 25 MG/ML IJ SOLN: 6.2500 mg | INTRAMUSCULAR | Status: DC | PRN

## 2011-06-28 MED ORDER — PHENOL 1.4 % MT LIQD
1.0000 | OROMUCOSAL | Status: DC | PRN
  Filled 2011-06-28: qty 177

## 2011-06-28 MED ORDER — ACETAMINOPHEN 650 MG RE SUPP: 650.0000 mg | Freq: Four times a day (QID) | RECTAL | Status: DC | PRN

## 2011-06-28 MED ORDER — PANTOPRAZOLE SODIUM 40 MG PO TBEC
40.0000 mg | DELAYED_RELEASE_TABLET | Freq: Every day | ORAL | Status: DC
  Administered 2011-06-29 – 2011-07-01 (×3): 40 mg via ORAL
  Filled 2011-06-28 (×3): qty 1

## 2011-06-28 MED ORDER — ONDANSETRON HCL 4 MG/2ML IJ SOLN: 4.0000 mg | Freq: Four times a day (QID) | INTRAMUSCULAR | Status: DC | PRN

## 2011-06-28 MED ORDER — POTASSIUM CHLORIDE IN NACL 20-0.9 MEQ/L-% IV SOLN
INTRAVENOUS | Status: DC
  Administered 2011-06-28: 14:00:00 via INTRAVENOUS
  Filled 2011-06-28 (×10): qty 1000

## 2011-06-28 MED ORDER — HYDROMORPHONE HCL PF 1 MG/ML IJ SOLN
0.5000 mg | INTRAMUSCULAR | Status: DC | PRN
  Administered 2011-06-28 – 2011-06-29 (×3): 1 mg via INTRAVENOUS
  Administered 2011-06-29 – 2011-06-30 (×2): 0.5 mg via INTRAVENOUS
  Filled 2011-06-28 (×6): qty 1

## 2011-06-28 MED ORDER — NITROGLYCERIN 0.4 MG SL SUBL: 0.4000 mg | SUBLINGUAL_TABLET | SUBLINGUAL | Status: DC | PRN

## 2011-06-28 MED ORDER — LORATADINE 10 MG PO TABS
10.0000 mg | ORAL_TABLET | Freq: Every day | ORAL | Status: DC
  Administered 2011-06-29 – 2011-07-01 (×3): 10 mg via ORAL
  Filled 2011-06-28 (×3): qty 1

## 2011-06-28 MED ORDER — THERA M PLUS PO TABS
1.0000 | ORAL_TABLET | Freq: Every day | ORAL | Status: DC
  Administered 2011-06-28 – 2011-07-01 (×4): 1 via ORAL
  Filled 2011-06-28 (×4): qty 1

## 2011-06-28 MED ORDER — ASPIRIN EC 325 MG PO TBEC
325.0000 mg | DELAYED_RELEASE_TABLET | Freq: Every day | ORAL | Status: DC
  Administered 2011-06-28 – 2011-07-01 (×4): 325 mg via ORAL
  Filled 2011-06-28 (×4): qty 1

## 2011-06-28 MED ORDER — CEFUROXIME SODIUM 1.5 G IJ SOLR
INTRAMUSCULAR | Status: DC | PRN
  Administered 2011-06-28: 1.5 g

## 2011-06-28 MED ORDER — LACTATED RINGERS IV SOLN
INTRAVENOUS | Status: DC | PRN
  Administered 2011-06-28 (×2): via INTRAVENOUS

## 2011-06-28 MED ORDER — CEFAZOLIN SODIUM-DEXTROSE 2-3 GM-% IV SOLR
2.0000 g | Freq: Four times a day (QID) | INTRAVENOUS | Status: AC
  Administered 2011-06-28 – 2011-06-29 (×3): 2 g via INTRAVENOUS
  Filled 2011-06-28 (×3): qty 50

## 2011-06-28 MED ORDER — VITAMIN D (ERGOCALCIFEROL) 1.25 MG (50000 UNIT) PO CAPS: 100000.0000 [IU] | ORAL_CAPSULE | ORAL | Status: DC

## 2011-06-28 MED ORDER — ACETAMINOPHEN 10 MG/ML IV SOLN
INTRAVENOUS | Status: DC | PRN
  Administered 2011-06-28: 1000 mg via INTRAVENOUS

## 2011-06-28 MED ORDER — PANTOPRAZOLE SODIUM 40 MG PO TBEC: 40.0000 mg | DELAYED_RELEASE_TABLET | Freq: Every day | ORAL | Status: DC

## 2011-06-28 MED ORDER — ZOLPIDEM TARTRATE 5 MG PO TABS: 5.0000 mg | ORAL_TABLET | Freq: Every evening | ORAL | Status: DC | PRN

## 2011-06-28 MED ORDER — BUPIVACAINE-EPINEPHRINE PF 0.5-1:200000 % IJ SOLN
INTRAMUSCULAR | Status: DC | PRN
  Administered 2011-06-28: 30 mL

## 2011-06-28 MED ORDER — FLUTICASONE PROPIONATE 50 MCG/ACT NA SUSP
1.0000 | Freq: Every day | NASAL | Status: DC
  Administered 2011-06-30 – 2011-07-01 (×2): 1 via NASAL
  Filled 2011-06-28: qty 16

## 2011-06-28 MED ORDER — OXYCODONE HCL 5 MG PO TABS
5.0000 mg | ORAL_TABLET | ORAL | Status: DC | PRN
  Administered 2011-06-28 – 2011-07-01 (×10): 10 mg via ORAL
  Filled 2011-06-28 (×10): qty 2

## 2011-06-28 MED ORDER — CLOPIDOGREL BISULFATE 75 MG PO TABS
75.0000 mg | ORAL_TABLET | Freq: Every day | ORAL | Status: DC
  Administered 2011-06-28 – 2011-07-01 (×4): 75 mg via ORAL
  Filled 2011-06-28 (×4): qty 1

## 2011-06-28 MED ORDER — SIMVASTATIN 20 MG PO TABS
20.0000 mg | ORAL_TABLET | Freq: Every day | ORAL | Status: DC
  Administered 2011-06-28 – 2011-06-30 (×3): 20 mg via ORAL
  Filled 2011-06-28 (×4): qty 1

## 2011-06-28 MED ORDER — MIDAZOLAM HCL 5 MG/5ML IJ SOLN
INTRAMUSCULAR | Status: DC | PRN
  Administered 2011-06-28: 1 mg via INTRAVENOUS

## 2011-06-28 MED ORDER — HYDROMORPHONE HCL PF 1 MG/ML IJ SOLN
0.2500 mg | INTRAMUSCULAR | Status: DC | PRN
  Administered 2011-06-28: 0.25 mg via INTRAVENOUS

## 2011-06-28 MED ORDER — SODIUM CHLORIDE 0.9 % IR SOLN
Status: DC | PRN
  Administered 2011-06-28: 3000 mL

## 2011-06-28 MED ORDER — SORBITOL 70 % SOLN
30.0000 mL | Freq: Two times a day (BID) | Status: DC
  Administered 2011-06-28 – 2011-06-30 (×4): 30 mL via ORAL
  Filled 2011-06-28 (×8): qty 30

## 2011-06-28 MED ORDER — DOCUSATE SODIUM 100 MG PO CAPS
100.0000 mg | ORAL_CAPSULE | Freq: Two times a day (BID) | ORAL | Status: DC
  Administered 2011-06-28 – 2011-07-01 (×7): 100 mg via ORAL
  Filled 2011-06-28 (×7): qty 1

## 2011-06-28 MED ORDER — LACTATED RINGERS IV SOLN: INTRAVENOUS | Status: DC

## 2011-06-28 MED ORDER — MENTHOL 3 MG MT LOZG: 1.0000 | LOZENGE | OROMUCOSAL | Status: DC | PRN

## 2011-06-28 SURGICAL SUPPLY — 70 items
AUTOTRANSFUSION W/QD PVC DRAIN (AUTOTRANSFUSION) ×1 IMPLANT
BANDAGE ESMARK 6X9 LF (GAUZE/BANDAGES/DRESSINGS) ×1 IMPLANT
BLADE SAGITTAL 25.0X1.19X90 (BLADE) ×2 IMPLANT
BLADE SAW SGTL 11.0X1.19X90.0M (BLADE) IMPLANT
BLADE SAW SGTL 13.0X1.19X90.0M (BLADE) ×2 IMPLANT
BLADE SURG 10 STRL SS (BLADE) ×4 IMPLANT
BNDG CMPR 9X6 STRL LF SNTH (GAUZE/BANDAGES/DRESSINGS) ×1
BNDG CMPR MED 15X6 ELC VLCR LF (GAUZE/BANDAGES/DRESSINGS) ×1
BNDG ELASTIC 6X15 VLCR STRL LF (GAUZE/BANDAGES/DRESSINGS) ×2 IMPLANT
BNDG ESMARK 6X9 LF (GAUZE/BANDAGES/DRESSINGS) ×2
BOWL SMART MIX CTS (DISPOSABLE) ×2 IMPLANT
CEMENT HV SMART SET (Cement) ×4 IMPLANT
CLOTH BEACON ORANGE TIMEOUT ST (SAFETY) ×2 IMPLANT
COVER BACK TABLE 24X17X13 BIG (DRAPES) IMPLANT
COVER PROBE W GEL 5X96 (DRAPES) ×1 IMPLANT
COVER SURGICAL LIGHT HANDLE (MISCELLANEOUS) ×2 IMPLANT
CUFF TOURNIQUET SINGLE 34IN LL (TOURNIQUET CUFF) ×2 IMPLANT
CUFF TOURNIQUET SINGLE 44IN (TOURNIQUET CUFF) IMPLANT
DRAPE EXTREMITY T 121X128X90 (DRAPE) ×2 IMPLANT
DRAPE INCISE IOBAN 66X45 STRL (DRAPES) ×1 IMPLANT
DRAPE PROXIMA HALF (DRAPES) ×2 IMPLANT
DRAPE U-SHAPE 47X51 STRL (DRAPES) ×2 IMPLANT
DRSG ADAPTIC 3X8 NADH LF (GAUZE/BANDAGES/DRESSINGS) ×2 IMPLANT
DRSG PAD ABDOMINAL 8X10 ST (GAUZE/BANDAGES/DRESSINGS) ×4 IMPLANT
DURAPREP 26ML APPLICATOR (WOUND CARE) ×2 IMPLANT
ELECT CAUTERY BLADE 6.4 (BLADE) ×2 IMPLANT
ELECT REM PT RETURN 9FT ADLT (ELECTROSURGICAL) ×2
ELECTRODE REM PT RTRN 9FT ADLT (ELECTROSURGICAL) ×1 IMPLANT
EVACUATOR 1/8 PVC DRAIN (DRAIN) ×1 IMPLANT
FACESHIELD LNG OPTICON STERILE (SAFETY) ×3 IMPLANT
GLOVE BIO SURGEON STRL SZ7 (GLOVE) ×2 IMPLANT
GLOVE BIOGEL PI IND STRL 7.0 (GLOVE) ×1 IMPLANT
GLOVE BIOGEL PI IND STRL 7.5 (GLOVE) ×1 IMPLANT
GLOVE BIOGEL PI INDICATOR 7.0 (GLOVE) ×1
GLOVE BIOGEL PI INDICATOR 7.5 (GLOVE) ×1
GLOVE SS BIOGEL STRL SZ 7.5 (GLOVE) ×1 IMPLANT
GLOVE SUPERSENSE BIOGEL SZ 7.5 (GLOVE) ×1
GOWN PREVENTION PLUS XLARGE (GOWN DISPOSABLE) ×4 IMPLANT
GOWN STRL NON-REIN LRG LVL3 (GOWN DISPOSABLE) ×4 IMPLANT
HANDPIECE INTERPULSE COAX TIP (DISPOSABLE) ×2
HOOD PEEL AWAY FACE SHEILD DIS (HOOD) ×4 IMPLANT
IMMOBILIZER KNEE 22 UNIV (SOFTGOODS) ×1 IMPLANT
INSERT CUSHION PRONEVIEW LG (MISCELLANEOUS) ×2 IMPLANT
KIT BASIN OR (CUSTOM PROCEDURE TRAY) ×2 IMPLANT
KIT ROOM TURNOVER OR (KITS) ×2 IMPLANT
MANIFOLD NEPTUNE II (INSTRUMENTS) ×2 IMPLANT
NDL 18GX1X1/2 (RX/OR ONLY) (NEEDLE) ×1 IMPLANT
NEEDLE 18GX1X1/2 (RX/OR ONLY) (NEEDLE) IMPLANT
NS IRRIG 1000ML POUR BTL (IV SOLUTION) ×2 IMPLANT
PACK TOTAL JOINT (CUSTOM PROCEDURE TRAY) ×2 IMPLANT
PAD ARMBOARD 7.5X6 YLW CONV (MISCELLANEOUS) ×4 IMPLANT
PAD CAST 4YDX4 CTTN HI CHSV (CAST SUPPLIES) ×1 IMPLANT
PADDING CAST COTTON 4X4 STRL (CAST SUPPLIES) ×2
PADDING CAST COTTON 6X4 STRL (CAST SUPPLIES) ×2 IMPLANT
POSITIONER HEAD PRONE TRACH (MISCELLANEOUS) ×2 IMPLANT
SET HNDPC FAN SPRY TIP SCT (DISPOSABLE) ×1 IMPLANT
SPONGE GAUZE 4X4 12PLY (GAUZE/BANDAGES/DRESSINGS) ×2 IMPLANT
STRIP CLOSURE SKIN 1/2X4 (GAUZE/BANDAGES/DRESSINGS) ×2 IMPLANT
SUCTION FRAZIER TIP 10 FR DISP (SUCTIONS) ×2 IMPLANT
SUT ETHIBOND NAB CT1 #1 30IN (SUTURE) ×5 IMPLANT
SUT MNCRL AB 3-0 PS2 18 (SUTURE) ×2 IMPLANT
SUT VIC AB 0 CT1 27 (SUTURE) ×6
SUT VIC AB 0 CT1 27XBRD ANBCTR (SUTURE) ×2 IMPLANT
SUT VIC AB 2-0 CT1 27 (SUTURE) ×4
SUT VIC AB 2-0 CT1 TAPERPNT 27 (SUTURE) ×2 IMPLANT
SYR 30ML SLIP (SYRINGE) ×1 IMPLANT
TOWEL OR 17X24 6PK STRL BLUE (TOWEL DISPOSABLE) ×2 IMPLANT
TOWEL OR 17X26 10 PK STRL BLUE (TOWEL DISPOSABLE) ×2 IMPLANT
TRAY FOLEY CATH 14FR (SET/KITS/TRAYS/PACK) ×2 IMPLANT
WATER STERILE IRR 1000ML POUR (IV SOLUTION) ×6 IMPLANT

## 2011-06-28 NOTE — Op Note (Signed)
NAMEJOMO, FORAND NO.:  [id number]  MEDICAL RECORD NO.:  [id number]  LOCATION:  5012                         FACILITY:  [facility]  PHYSICIAN:  Elana Alm. Thurston Hole, M.D. DATE OF BIRTH:  August 04, 1929  DATE OF PROCEDURE:  06/28/2011 DATE OF DISCHARGE:                              OPERATIVE REPORT   PREOPERATIVE DIAGNOSIS:  Left knee degenerative joint disease.  POSTOPERATIVE DIAGNOSIS:  Left knee degenerative joint disease.  PROCEDURE: 1. Left total knee replacement using DePuy cemented total knee system     with #5 cemented femur, #5 cemented tibia with 12.5-mm polyethylene     RP tibial spacer, and 35-mm polyethylene cemented patella. 2. Zinacef impregnated cement.  SURGEON:  Elana Alm. Thurston Hole, MD  ASSISTANT:  Julien Girt, PA-C  ANESTHESIA:  General.  OPERATIVE TIME:  About 1 hour and 40 minutes.  COMPLICATIONS:  None.  DESCRIPTION OF PROCEDURE:  Mr. Siedschlag was brought to the operating room on June 28, 2011, after femoral nerve block was placed in the holding room by Anesthesia.  He was placed on the operative table in supine position.  He received Ancef 2 g IV preoperatively for prophylaxis.  After being placed under general anesthesia, a Foley catheter placed under sterile conditions.  His left knee was examined under anesthesia.  Range of motion from -20 to 85 degrees with significant varus deformity, knee stable ligamentous exam with normal patellar tracking.  Left leg was prepped using sterile DuraPrep and draped using sterile technique.  Leg was exsanguinated, and a thigh tourniquet elevated to 365 mm.  Time-out procedure was called and the correct left knee identified.  Initially, through a 15-cm longitudinal incision based over the patella, initial exposure was made.  The underlying subcutaneous tissues were incised along with skin incision. A median arthrotomy was performed revealing an excessive amount of normal-appearing joint fluid.   The articular surfaces were inspected. He had grade 4 changes medially, laterally, and in the patellofemoral joint.  Osteophytes removed from the femoral condyles and tibial plateau.  The medial and lateral meniscal remnants were removed as well as the anterior cruciate ligament.  An intramedullary drill was then drilled up the femoral canal for placement of distal femoral cutting jig, which was placed in the appropriate manner of rotation and a distal 15-mm cut was made.  The distal femur was incised.  #5 was found to be the appropriate size.  #5 cutting jig was placed in the appropriate manner of external rotation and then these cuts were made.  Proximal tibia was then exposed.  The tibial spines were removed with an oscillating saw.  Intramedullary drill was drilled down the tibial canal for placement of proximal tibial cutting jig, which was placed in the appropriate manner of rotation and a proximal 6 mm cut was made based off the medial or lower side.  Spacer blocks were then placed in flexion and extension.  12.5 mm blocks gave excellent balancing, excellent stability, and excellent correction of his flexion and varus deformities.  A #5 tibial baseplate trial was then placed on the cut tibial surface with an excellent fit and a keel cut was made.  The PCL box cutter was then placed on the distal  femur and these cuts were made. At this point, with the #5 femoral trial in place, a #5 tibial baseplate trial and a 12.5-mm polyethylene RP tibial spacer, knee was reduced, taken through range of motion from 0-135 degrees with excellent stability and excellent correction of his flexion and varus deformities and normal patellar tracking.  A resurfacing 9-mm cut was then made on the patella and 3 locking holes placed for a 35-mm polyethylene patellar trial and again, patellofemoral tracking was evaluated and found to be normal.  At this point, it was felt that all the trial components  were of excellent size, fit, and stability.  They were then removed.  The knee was then jet lavage irrigated with 3 L of saline.  The proximal tibia was then exposed and a #5 tibial base plate with Zinacef impregnated cement backing was hammered into position with an excellent fit, with excess cement being removed from around the edges.  A #5 femoral component with cement backing was hammered in position also with an excellent fit, with excess cement being removed from around the edges.  The 12.5-mm polyethylene RP tibial spacer was placed on tibial baseplate.  The knee reduced, taken through range of motion from 0-135 degrees with excellent stability and excellent correction of his flexion and varus deformities.  The 35-mm polyethylene cement backed patella was then placed in its position and held there with a clamp.  After the cement hardened, again patellofemoral tracking was evaluated and found to be normal.  At this point, it was felt that all components were of excellent size, fit, and stability.  The wound was further irrigated with saline.  Tourniquet was released,  hemostasis obtained with cautery.  The arthrotomy was then closed with #1 Ethibond suture over 2 medium Hemovac drains.  Subcutaneous tissues closed with 0 and 2-0 Vicryl, subcuticular layer closed with 4-0 Monocryl.  Sterile dressings and long-leg splint applied, and then the patient awakened, extubated, and taken to recovery room in stable condition.  Needle and sponge counts were correct x2 at the end of the case.  Neurovascular status normal.  Pulses 2+ and symmetric.     Lahari Suttles A. Thurston Hole, M.D.     RAW/MEDQ  D:  06/28/2011  T:  06/28/2011  Job:  [id number]

## 2011-06-28 NOTE — Anesthesia Postprocedure Evaluation (Signed)
  Anesthesia Post-op Note  Patient: John Carter  Procedure(s) Performed:  TOTAL KNEE ARTHROPLASTY - TOTAL KNEE ARTHROPLASTY LEFT SIDE  Patient Location: PACU  Anesthesia Type: GA combined with regional for post-op pain  Level of Consciousness: sedated and responds to stimulation  Airway and Oxygen Therapy: Patient Spontanous Breathing  Post-op Pain: mild  Post-op Assessment: Post-op Vital signs reviewed, Patient's Cardiovascular Status Stable, Respiratory Function Stable, Patent Airway, No signs of Nausea or vomiting and Pain level controlled  Post-op Vital Signs: Reviewed and stable  Complications: No apparent anesthesia complications

## 2011-06-28 NOTE — Plan of Care (Signed)
Problem: Consults Goal: Diagnosis- Total Joint Replacement Primary Total Knee     

## 2011-06-28 NOTE — Preoperative (Signed)
Beta Blockers   Reason not to administer Beta Blockers:Not Applicable 

## 2011-06-28 NOTE — Interval H&P Note (Signed)
History and Physical Interval Note:  06/28/2011 7:07 AM  John Carter  has presented today for surgery, with the diagnosis of DJD  The various methods of treatment have been discussed with the patient and family. After consideration of risks, benefits and other options for treatment, the patient has consented to  Procedure(s): LEFT TOTAL KNEE ARTHROPLASTY as a surgical intervention .  The patients' history has been reviewed, patient examined, no change in status, stable for surgery.  I have reviewed the patients' chart and labs.  Questions were answered to the patient's satisfaction.     Salvatore Marvel A

## 2011-06-28 NOTE — Transfer of Care (Signed)
Immediate Anesthesia Transfer of Care Note  Patient: John Carter  Procedure(s) Performed:  TOTAL KNEE ARTHROPLASTY - TOTAL KNEE ARTHROPLASTY LEFT SIDE  Patient Location: PACU  Anesthesia Type: General and GA combined with regional for post-op pain  Level of Consciousness: sedated  Airway & Oxygen Therapy: Patient Spontanous Breathing and Patient connected to nasal cannula oxygen  Post-op Assessment: Report given to PACU RN and Post -op Vital signs reviewed and stable  Post vital signs: Reviewed and stable  Complications: No apparent anesthesia complications

## 2011-06-28 NOTE — Anesthesia Preprocedure Evaluation (Signed)
Anesthesia Evaluation  Patient identified by MRN, date of birth, ID band Patient awake    Reviewed: Allergy & Precautions, H&P , NPO status , Patient's Chart, lab work & pertinent test results, reviewed documented beta blocker date and time   Airway Mallampati: II TM Distance: >3 FB Neck ROM: Full    Dental  (+) Teeth Intact   Pulmonary sleep apnea and Continuous Positive Airway Pressure Ventilation ,  clear to auscultation        Cardiovascular hypertension, Pt. on medications and Pt. on home beta blockers + angina + CAD, + Past MI and + Cardiac Stents Regular Normal    Neuro/Psych TIA Neuromuscular disease CVA, No Residual Symptoms    GI/Hepatic GERD-  Medicated and Controlled,  Endo/Other    Renal/GU      Musculoskeletal   Abdominal   Peds  Hematology   Anesthesia Other Findings   Reproductive/Obstetrics                           Anesthesia Physical Anesthesia Plan  ASA: III  Anesthesia Plan: General and Regional   Post-op Pain Management:    Induction: Intravenous  Airway Management Planned: LMA  Additional Equipment:   Intra-op Plan:   Post-operative Plan: Extubation in OR  Informed Consent: I have reviewed the patients History and Physical, chart, labs and discussed the procedure including the risks, benefits and alternatives for the proposed anesthesia with the patient or authorized representative who has indicated his/her understanding and acceptance.     Plan Discussed with: CRNA  Anesthesia Plan Comments:         Anesthesia Quick Evaluation

## 2011-06-28 NOTE — H&P (View-Only) (Signed)
John Carter is an 77 y.o. male.   Chief Complaint: Left knee end stage djd HPI: John Carter is a pleasant 77 year-old male patient who comes in today for follow up on his bilateral knee pain.  The left knee hurts worse than the right knee.  He was seen previously on February 02, 2011 and he was treated with bilateral Cortisone injections in both knees, which gave him relief for 2 months.  He states that his knee pain got worse about 2 weeks ago.  The left knee is worse than the right knee.  The pain is excruciating and severe, 10/10.  He hasn't slept in the last two nights due to the knee pain.  The pain is sharp, burning pain.  Radiating distally to his leg.  No numbness.  No tingling.  The pain is worse with weight bearing activities, improved sometimes with rest.  He is taking Tramadol for pain which helps some.  The pain in the right knee is less intense than the left knee with the same characteristics.  He denies any numbness or tingling distally.    Past Medical History  Diagnosis Date  . Coronary artery disease   . Hypertension   . GERD (gastroesophageal reflux disease)   . Angina   . Multiple myeloma 06/2009  . Stroke   . Arthritis   . Myocardial infarction   . Hiatal hernia   . Sleep apnea     uses CPAP settings 10    Past Surgical History  Procedure Date  . Coronary angioplasty   . Back surgery   . Tonsillectomy   . Colon surgery   . Breast surgery   . Diagnostic laparoscopy   . Eye surgery     glass removed from eye    History reviewed. No pertinent family history. Social History:  reports that he has never smoked. He does not have any smokeless tobacco history on file. He reports that he does not drink alcohol or use illicit drugs.  Allergies:  Allergies  Allergen Reactions  . Sulfa Drugs Cross Reactors Rash    Medications Prior to Admission  Medication Sig Dispense Refill  . acetaminophen (TYLENOL) 500 MG tablet Take 1,000 mg by mouth every 6 (six) hours  as needed. For pain       . amLODipine (NORVASC) 5 MG tablet Take 5 mg by mouth daily.        . aspirin 325 MG EC tablet Take 325 mg by mouth daily.        . cetirizine-pseudoephedrine (ZYRTEC-D) 5-120 MG per tablet Take 1 tablet by mouth 2 (two) times daily.        . clopidogrel (PLAVIX) 75 MG tablet Take 75 mg by mouth daily.        . flunisolide (NASAREL) 29 MCG/ACT (0.025%) nasal spray Place 2 sprays into the nose at bedtime. Dose is for each nostril.       . glucosamine-chondroitin 500-400 MG tablet Take 1 tablet by mouth daily.        . metoprolol (TOPROL-XL) 50 MG 24 hr tablet Take 50 mg by mouth daily.        . Multiple Vitamins-Minerals (MULTIVITAMINS THER. W/MINERALS) TABS Take 1 tablet by mouth daily.        . omeprazole (PRILOSEC) 20 MG capsule Take 20 mg by mouth daily.        . simvastatin (ZOCOR) 20 MG tablet Take 20 mg by mouth at bedtime.        .   traMADol (ULTRAM) 50 MG tablet Take 50 mg by mouth every 6 (six) hours as needed. For knee pain Maximum dose= 8 tablets per day       . valsartan (DIOVAN) 320 MG tablet Take 320 mg by mouth daily.        . Vitamin D, Ergocalciferol, (DRISDOL) 50000 UNITS CAPS Take 100,000 Units by mouth every 30 (thirty) days.        . nitroGLYCERIN (NITROSTAT) 0.4 MG SL tablet Place 0.4 mg under the tongue every 5 (five) minutes as needed. For chest pain        No current facility-administered medications on file as of 06/15/2011.    No results found for this or any previous visit (from the past 48 hour(s)). No results found.  Review of Systems  Constitutional: Negative.   HENT: Negative.   Eyes: Negative.   Respiratory: Negative.   Cardiovascular: Negative.   Gastrointestinal: Negative.   Genitourinary: Negative.   Musculoskeletal: Positive for joint pain.       Left knee  Skin: Negative.   Neurological: Negative.   Endo/Heme/Allergies: Negative.   Psychiatric/Behavioral: Negative.     Blood pressure 151/77, pulse 77, temperature  98.1 F (36.7 C), temperature source Oral, resp. rate 16, height 5' 5" (1.651 m), weight 88.451 kg (195 lb), SpO2 95.00%. Physical Exam  Constitutional: He appears well-developed and well-nourished.  HENT:  Head: Normocephalic and atraumatic.  Mouth/Throat: Oropharynx is clear and moist.  Eyes: Conjunctivae and EOM are normal. Pupils are equal, round, and reactive to light.  Neck: Normal range of motion. Neck supple.  Cardiovascular: Normal rate, regular rhythm, normal heart sounds and intact distal pulses.   Respiratory: Effort normal and breath sounds normal.  GI: Soft. Bowel sounds are normal.  Genitourinary:       Not pertinent to current symptomatology therefore not examined.   Musculoskeletal:       Musculoskeletal: Left knee with intact skin.  Range of motion extension 0 degrees, flexion 110 degrees.  There is severe patellofemoral crepitus.  Ligaments are intact.  There is tenderness to palpation in the medial joint line.  Sensation is intact distally.  Right knee with intact skin.  No swelling.  No hematoma.  There is patellofemoral crepitus, less severe than in the left knee.  There is tenderness to palpation in the mid joint line.  Ligaments are intact.       Assessment Past Medical History  Diagnosis Date  . Coronary artery disease   . Hypertension   . GERD (gastroesophageal reflux disease)   . Angina   . Multiple myeloma 06/2009  . Stroke   . Arthritis   . Myocardial infarction   . Hiatal hernia   . Sleep apnea     uses CPAP settings 10   Plan He has a very high surgical risk due to his multiple co-morbidities.  He has been evaluated and cleared by Dr. Hank Smith, his cardiologist, Dr. James Osborne, his primary care physician, Dr. Fox, Dr Henry Smith, and by his oncologist, Dr. Odogwu.  The risks, benefits, and possible complications of the procedure were discussed in detail with the patient.  The patient is without question.  Lurdes Haltiwanger J 06/15/2011, 3:45  PM    

## 2011-06-28 NOTE — Brief Op Note (Signed)
06/28/2011  9:20 AM  PATIENT:  John Carter  77 y.o. male  PRE-OPERATIVE DIAGNOSIS:  degenerative joint disease left knee  POST-OPERATIVE DIAGNOSIS:  degenerative joint disease left knee  PROCEDURE:  Procedure(s):  LEFT TOTAL KNEE ARTHROPLASTY  SURGEON:  Surgeon(s): Nilda Simmer, MD  PHYSICIAN ASSISTANT: Julien Girt PA-C   ASSISTANTS: Julien Girt PA-C   ANESTHESIA:   general  EBL:  Total I/O In: 1000 [I.V.:1000] Out: -   BLOOD ADMINISTERED:none  DRAINS: AUTOVAC   LOCAL MEDICATIONS USED:  NONE  SPECIMEN:  No Specimen  DISPOSITION OF SPECIMEN:  N/A  COUNTS:  YES  TOURNIQUET:  * Missing tourniquet times found for documented tourniquets in log:  6275 *  DICTATION: .Note written in EPIC  PLAN OF CARE: Admit to inpatient   PATIENT DISPOSITION:  PACU - hemodynamically stable.   Delay start of Pharmacological VTE agent (>24hrs) due to surgical blood loss or risk of bleeding: NO

## 2011-06-28 NOTE — Anesthesia Procedure Notes (Addendum)
Anesthesia Regional Block:  Femoral nerve block  Pre-Anesthetic Checklist: ,, timeout performed, Correct Patient, Correct Site, Correct Laterality, Correct Procedure, Correct Position, site marked, Risks and benefits discussed, pre-op evaluation,  At surgeon's request and post-op pain management  Laterality: Left  Prep: Maximum Sterile Barrier Precautions used and Betadine       Needles:  Injection technique: Single-shot  Needle Type: Other   (Arrow 50mm)    Needle Gauge: 22 and 22 G    Additional Needles:  Procedures: nerve stimulator Femoral nerve block  Nerve Stimulator or Paresthesia:  Response: Patellar respose, 0.4 mA,   Additional Responses:   Narrative:  Start time: 06/28/2011 7:05 AM End time: 06/28/2011 7:15 AM Injection made incrementally with aspirations every 5 mL. Anesthesiologist: Fitzgerald,MD  Additional Notes: 2% Lidocaine skin wheel.   Femoral nerve block Procedure Name: LMA Insertion Date/Time: 06/28/2011 7:37 AM Performed by: Gwenyth Allegra Pre-anesthesia Checklist: Patient identified, Timeout performed, Emergency Drugs available, Suction available and Patient being monitored Patient Re-evaluated:Patient Re-evaluated prior to inductionOxygen Delivery Method: Circle System Utilized Intubation Type: IV induction LMA: LMA inserted LMA Size: 5.0 Number of attempts: 1 Placement Confirmation: positive ETCO2,  CO2 detector and breath sounds checked- equal and bilateral Dental Injury: Teeth and Oropharynx as per pre-operative assessment

## 2011-06-29 LAB — BASIC METABOLIC PANEL
CO2: 25 mEq/L (ref 19–32)
Calcium: 8.1 mg/dL — ABNORMAL LOW (ref 8.4–10.5)
Chloride: 102 mEq/L (ref 96–112)
GFR calc Af Amer: >90 mL/min (ref >90)
Sodium: 137 mEq/L (ref 135–145)

## 2011-06-29 LAB — CBC
MCH: 35.5 pg — ABNORMAL HIGH (ref 26.0–34.0)
Platelets: 143 10*3/uL — ABNORMAL LOW (ref 150–400)
RBC: 2.48 MIL/uL — ABNORMAL LOW (ref 4.22–5.81)
WBC: 8.1 10*3/uL (ref 4.0–10.5)

## 2011-06-29 MED ORDER — SODIUM CHLORIDE 0.9 % IV BOLUS (SEPSIS)
500.0000 mL | Freq: Once | INTRAVENOUS | Status: AC
  Administered 2011-06-29: 500 mL via INTRAVENOUS

## 2011-06-29 NOTE — Progress Notes (Signed)
Occupational Therapy Evaluation Patient Details Name: John Carter MRN: [id number] DOB: September 19, 1929 Today's Date: 06/29/2011  Problem List:  Patient Active Problem List  Diagnoses  . Multiple myeloma  . Melanoma  . Hypertension  . Hyperlipidemia  . Coronary artery disease  . Myocardial infarction  . Sleep apnea  . TIA (transient ischemic attack)  . Arthritis  . Chronic back pain  . GERD (gastroesophageal reflux disease)  . Hiatal hernia  . Gastric ulcer  . Diverticulosis  . Hemorrhoids  . History of colonic polyps  . Urinary frequency  . Nocturia  . Leg cramps  . Cataracts, bilateral    Past Medical History:  Past Medical History  Diagnosis Date  . Angina   . Stroke   . Multiple myeloma 06/2009    was on chemo  . Melanoma   . Hypertension     amlodipine and metoprolol  . Hyperlipidemia     takes Zocor daily  . Coronary artery disease     2 stents placed in 2001  . Myocardial infarction 2001  . Sleep apnea     sleep study done 1yrs ago and pt does use a CPAP;setting of 10  . TIA (transient ischemic attack) 2007  . Arthritis     left knee  . Chronic back pain     compression fracture,degenerative disc disease  . Bruises easily     pt is on Plavix and Aspirin  . GERD (gastroesophageal reflux disease)     takes Omeprazole daily  . Hiatal hernia   . Gastric ulcer   . Diverticulosis   . Hemorrhoids   . History of colonic polyps   . Urinary frequency   . Nocturia   . Leg cramps   . Cataracts, bilateral   . Hiatal hernia    Past Surgical History:  Past Surgical History  Procedure Date  . Back surgery 80's  . Tonsillectomy     as a  child  . Colonoscopy   . Appendectomy     as a child  . Knee surgery 1997    left knee arthroscopy  . Knee arthroscopy 1999    right  . Coronary angioplasty 09/04/1999    OT Assessment/Plan/Recommendation OT Assessment Clinical Impression Statement: 77 yo male s/p LTKA presents with decreased I with  functional transfers and ADLs. Pt will benefit from acute OT services to maximize independence and safety with functional transfers and ADLs for dc planning. If pt progresses well, may be able to dc home with HHOT follow up; if slow to progress, will consider SNF for rehab. Pt's performance during eval was limited by nausea OT Recommendation/Assessment: Patient will need skilled OT in the acute care venue OT Problem List: Decreased activity tolerance;Decreased knowledge of use of DME or AE;Pain OT Therapy Diagnosis : Generalized weakness;Acute pain OT Plan OT Frequency: Min 2X/week OT Treatment/Interventions: Self-care/ADL training;DME and/or AE instruction;Therapeutic activities;Patient/family education OT Recommendation Follow Up Recommendations: Skilled nursing facility Equipment Recommended: Defer to next venue Individuals Consulted Consulted and Agree with Results and Recommendations: Patient OT Goals Acute Rehab OT Goals OT Goal Formulation: With patient Time For Goal Achievement: 2 weeks ADL Goals Pt Will Perform Grooming: with modified independence;Standing at sink ADL Goal: Grooming - Progress: Other (comment) Pt Will Perform Lower Body Bathing: Sit to stand from bed;Sit to stand from chair;with modified independence ADL Goal: Lower Body Bathing - Progress: Other (comment) Pt Will Perform Lower Body Dressing: Sit to stand from chair;Sit to stand from bed;with modified independence  ADL Goal: Lower Body Dressing - Progress: Other (comment) Pt Will Transfer to Toilet: with modified independence;with DME;3-in-1;Stand pivot transfer ADL Goal: Toilet Transfer - Progress: Other (comment) Miscellaneous OT Goals Miscellaneous OT Goal #1: Pt will perform bed mobility with mod I in prep for EOB ADLs. OT Goal: Miscellaneous Goal #1 - Progress: Other (comment)  OT Evaluation Precautions/Restrictions  Precautions Precautions: Knee Restrictions Weight Bearing Restrictions: Yes RLE Weight  Bearing: Weight bearing as tolerated Prior Functioning Home Living Lives With: Spouse Receives Help From: Family Type of Home: House Home Layout: One level Home Access: Stairs to enter Entrance Stairs-Rails: None Entrance Stairs-Number of Steps: 2 (1+1) Bathroom Shower/Tub: Psychologist, counselling;Door Bathroom Toilet: Handicapped height Home Adaptive Equipment: Bedside commode/3-in-1;Walker - rolling;Quad cane Prior Function Level of Independence: Independent with basic ADLs;Needs assistance with gait;Other (comment) ADL ADL Eating/Feeding: Simulated;Independent Where Assessed - Eating/Feeding: Chair Grooming: Simulated;Set up Where Assessed - Grooming: Sitting, chair;Supported Upper Body Bathing: Simulated;Set up Where Assessed - Upper Body Bathing: Sitting, chair Lower Body Bathing: Simulated;Moderate assistance Where Assessed - Lower Body Bathing: Supported;Sit to stand from bed Upper Body Dressing: Simulated;Set up Where Assessed - Upper Body Dressing: Sitting, bed Lower Body Dressing: Simulated;Maximal assistance Where Assessed - Lower Body Dressing: Sit to stand from bed;Supported Toilet Transfer: Simulated;Moderate assistance Toilet Transfer Method: Ambulating Toileting - Clothing Manipulation: Simulated;Moderate assistance Where Assessed - Toileting Clothing Manipulation: Standing Tub/Shower Transfer: Not assessed ADL Comments: Pt limited by nausea during eval.  Vision/Perception    Cognition Cognition Arousal/Alertness: Awake/alert Overall Cognitive Status: Appears within functional limits for tasks assessed Orientation Level: Oriented X4 Sensation/Coordination Sensation Light Touch: Appears Intact Coordination Gross Motor Movements are Fluid and Coordinated: Yes Fine Motor Movements are Fluid and Coordinated: Not tested Extremity Assessment RUE Assessment RUE Assessment: Within Functional Limits LUE Assessment LUE Assessment: Within Functional Limits Mobility    Bed Mobility Bed Mobility: Yes Supine to Sit: 4: Min assist;HOB flat;With rails Supine to Sit Details (indicate cue type and reason): step-by-step cues for technique; good job Sitting - Scoot to [place] of Bed: 5: Supervision Sitting - Scoot to [place] of Bed Details (indicate cue type and reason): cues for technique Transfers Sit to Stand: 3: Mod assist (light mod assist, pt=70%) Sit to Stand Details (indicate cue type and reason): cues for optimal positioning and technique; heavily dependent on UE push Stand to Sit: 4: Min assist;With upper extremity assist;To chair/3-in-1 Stand to Sit Details: cues for prepositioning for comfort and safe technique including hand palcement and to breathe Exercises  End of Session OT - End of Session Equipment Utilized During Treatment: Gait belt;Left knee immobilizer Activity Tolerance: Patient limited by fatigue;Patient limited by pain Patient left: in chair;with call bell in reach General Behavior During Session: [facility] for tasks performed Cognition: [facility] for tasks performed   Cipriano Mile 06/29/2011, 3:00 PM  06/29/2011 Cipriano Mile OTR/L Pager [phone] Office [phone]

## 2011-06-29 NOTE — Progress Notes (Signed)
Patient ID: John Carter, male   DOB: 1930-05-06, 77 y.o.   MRN: [id number] PATIENT ID: John Carter  MRN: [id number]  DOB/AGE:  24-Jul-1929 / 77 y.o.  1 Day Post-Op Procedure(s) (LRB): TOTAL KNEE ARTHROPLASTY (Left)    PROGRESS NOTE Subjective: Patient is alert, oriented, no Nausea, no Vomiting, no passing gas, no Bowel Movement. Taking PO yes. Denies SOB, Chest or Calf Pain. Using Incentive Spirometer, PAS in place. Ambulate not yet, CPM 0-90 Patient reports pain as 7 on 0-10 scale  .    Objective: Vital signs in last 24 hours: Filed Vitals:   06/28/11 1200 06/28/11 1400 06/28/11 2302 06/29/11 0546  BP: 122/60 143/66 157/62 161/72  Pulse: 75 72 89 101  Temp: 97.9 F (36.6 C) 97.6 F (36.4 C) 98.8 F (37.1 C) 99.2 F (37.3 C)  TempSrc:  Oral    Resp: 12 20 18 20   SpO2: 96% 100% 93% 95%      Intake/Output from previous day: I/O last 3 completed shifts: In: 2340 [P.O.:240; I.V.:1500; Other:550; IV Piggyback:50] Out: 2675 [Urine:2375; Drains:250; Blood:50]   Intake/Output this shift:     LABORATORY DATA:  Basename 06/29/11 0540  WBC 8.1  HGB 8.8*  HCT 25.8*  PLT 143*  NA 137  K 3.7  CL 102  CO2 25  BUN 13  CREATININE 0.70  GLUCOSE 160*  GLUCAP --  INR --  CALCIUM 8.1*    Examination: ABD soft Neurovascular intact Sensation intact distally Intact pulses distally Dorsiflexion/Plantar flexion intact Incision: dressing C/D/I Compartment soft no palpitations, no wheezing, some abdominal distension}  Assessment:   1 Day Post-Op Procedure(s) (LRB): TOTAL KNEE ARTHROPLASTY (Left) ADDITIONAL DIAGNOSIS:  Acute Blood Loss Anemia, Hypertension, Sleep Apnea and CAD  Plan: PT/OT WBAT, CPM 5/hrs day until ROM 0-90 degrees, then D/C CPM DVT Prophylaxis:  SCD and TED hose DISCHARGE PLAN:home vs Skilled Nursing Facility/Rehab DISCHARGE NEEDS: HHPT, CPM, Walker, 3-in-1 comode seat and may need SNF instead of going home will order SW consult       John Carter J 06/29/2011, 8:26 AM

## 2011-06-29 NOTE — Progress Notes (Signed)
Pt. Placed himself on CPAP. Settings of 8.0 cm H20. Pt. Tolerating well.

## 2011-06-29 NOTE — Progress Notes (Signed)
Physical Therapy Evaluation Patient Details Name: John Carter MRN: [id number] DOB: Aug 24, 1929 Today's Date: 06/29/2011  Problem List:  Patient Active Problem List  Diagnoses  . Multiple myeloma  . Melanoma  . Hypertension  . Hyperlipidemia  . Coronary artery disease  . Myocardial infarction  . Sleep apnea  . TIA (transient ischemic attack)  . Arthritis  . Chronic back pain  . GERD (gastroesophageal reflux disease)  . Hiatal hernia  . Gastric ulcer  . Diverticulosis  . Hemorrhoids  . History of colonic polyps  . Urinary frequency  . Nocturia  . Leg cramps  . Cataracts, bilateral    Past Medical History:  Past Medical History  Diagnosis Date  . Angina   . Stroke   . Multiple myeloma 06/2009    was on chemo  . Melanoma   . Hypertension     amlodipine and metoprolol  . Hyperlipidemia     takes Zocor daily  . Coronary artery disease     2 stents placed in 2001  . Myocardial infarction 2001  . Sleep apnea     sleep study done 33yrs ago and pt does use a CPAP;setting of 10  . TIA (transient ischemic attack) 2007  . Arthritis     left knee  . Chronic back pain     compression fracture,degenerative disc disease  . Bruises easily     pt is on Plavix and Aspirin  . GERD (gastroesophageal reflux disease)     takes Omeprazole daily  . Hiatal hernia   . Gastric ulcer   . Diverticulosis   . Hemorrhoids   . History of colonic polyps   . Urinary frequency   . Nocturia   . Leg cramps   . Cataracts, bilateral   . Hiatal hernia    Past Surgical History:  Past Surgical History  Procedure Date  . Back surgery 80's  . Tonsillectomy     as a  child  . Colonoscopy   . Appendectomy     as a child  . Knee surgery 1997    left knee arthroscopy  . Knee arthroscopy 1999    right  . Coronary angioplasty 09/04/1999    PT Assessment/Plan/Recommendation PT Assessment Clinical Impression Statement: 77 yo male s/p LTKA presents with decr functional mobility,  decr activity tolerance, and impairments listed below; will benefit from acute PT services to maximize independence and safety with mobility/transfers/amb/facilitate dc planning; if pt progresses well, may be able to dc home with HHPT follow up; if slow to progress, will consider SNF for rehab; Worth noting, today, pt's amb dstance was not limited by his knee -- was limited by nausea PT Recommendation/Assessment: Patient will need skilled PT in the acute care venue PT Problem List: Decreased strength;Decreased range of motion;Decreased activity tolerance;Decreased balance;Decreased mobility;Decreased knowledge of use of DME;Pain PT Therapy Diagnosis : Difficulty walking;Abnormality of gait;Acute pain PT Plan PT Frequency: 7X/week PT Treatment/Interventions: DME instruction;Gait training;Stair training;Functional mobility training;Therapeutic activities;Therapeutic exercise;Patient/family education PT Recommendation Follow Up Recommendations: Skilled nursing facility;Home health PT;24 hour supervision/assistance (depends on progress) Equipment Recommended: Defer to next venue PT Goals  Acute Rehab PT Goals PT Goal Formulation: With patient Time For Goal Achievement: 7 days Pt will go Supine/Side to Sit: with modified independence PT Goal: Supine/Side to Sit - Progress: Progressing toward goal Pt will go Sit to Supine/Side: with modified independence PT Goal: Sit to Supine/Side - Progress: Progressing toward goal Pt will go Sit to Stand: with modified independence PT Goal:  Sit to Stand - Progress: Progressing toward goal Pt will go Stand to Sit: with modified independence PT Goal: Stand to Sit - Progress: Progressing toward goal Pt will Ambulate: >150 feet;with modified independence;with rolling walker PT Goal: Ambulate - Progress: Progressing toward goal Pt will Go Up / Down Stairs: 1-2 stairs;with modified independence;with supervision;with rolling walker PT Goal: Up/Down Stairs - Progress:  Progressing toward goal Pt will Perform Home Exercise Program: Independently PT Goal: Perform Home Exercise Program - Progress: Progressing toward goal  PT Evaluation Precautions/Restrictions  Precautions Precautions: Knee Restrictions RLE Weight Bearing: Weight bearing as tolerated Prior Functioning  Home Living Lives With: Spouse Receives Help From: Family Type of Home: House Home Layout: One level Home Access: Stairs to enter Entrance Stairs-Rails: None Entrance Stairs-Number of Steps: 2 (1+1) Bathroom Shower/Tub: Psychologist, counselling;Door Bathroom Toilet: Handicapped height Home Adaptive Equipment: Bedside commode/3-in-1;Walker - rolling;Quad cane Prior Function Level of Independence: Independent with basic ADLs;Needs assistance with gait;Other (comment) Cognition Cognition Arousal/Alertness: Awake/alert Overall Cognitive Status: Appears within functional limits for tasks assessed Orientation Level: Oriented X4 Sensation/Coordination Sensation Light Touch: Appears Intact Coordination Gross Motor Movements are Fluid and Coordinated: Yes Fine Motor Movements are Fluid and Coordinated: Not tested Extremity Assessment RUE Assessment RUE Assessment: Within Functional Limits LUE Assessment LUE Assessment: Within Functional Limits RLE Assessment RLE Assessment: Exceptions to [facility] RLE Strength RLE Overall Strength Comments: Noted some decr strength, with heavy dependence on UE push for successful sit to stand LLE Assessment LLE Assessment: Exceptions to [facility] LLE Strength LLE Overall Strength Comments: Grossly decr AROM and strength, limited by pain postop; positive quad activation; AAROM flex to approx 60 degrees Mobility (including Balance) Bed Mobility Bed Mobility: Yes Supine to Sit: 4: Min assist;HOB flat;With rails Supine to Sit Details (indicate cue type and reason): step-by-step cues for technique; good job Sitting - Scoot to [place] of Bed: 5: Supervision Sitting - Scoot  to [place] of Bed Details (indicate cue type and reason): cues for technique Transfers Transfers: Yes Sit to Stand: 3: Mod assist (light mod assist, pt=70%) Sit to Stand Details (indicate cue type and reason): cues for optimal positioning and technique; heavily dependent on UE push Stand to Sit: 4: Min assist;With upper extremity assist;To chair/3-in-1 Stand to Sit Details: cues for prepositioning for comfort and safe technique including hand palcement and to breathe Ambulation/Gait Ambulation/Gait: Yes Ambulation/Gait Assistance: 3: Mod assist (light mod assist, pt=70%) Ambulation/Gait Assistance Details (indicate cue type and reason): cues for sequence; physical assist to progress RW; cues to self monitor for activity tolerance, cues for upright posture Ambulation Distance (Feet): 8 Feet Assistive device: Rolling walker Gait Pattern: Step-to pattern    Exercise  Total Joint Exercises Ankle Circles/Pumps: AROM;Both;10 reps;Supine Heel Slides: AAROM;Left;10 reps;Supine Straight Leg Raises: AAROM;Left;5 reps;Supine End of Session PT - End of Session Equipment Utilized During Treatment: Gait belt;Left knee immobilizer Activity Tolerance: Patient limited by fatigue (limited by nausea/dizziness) Patient left: in chair;with call bell in reach Nurse Communication: Mobility status for transfers;Mobility status for ambulation General Behavior During Session: [facility] for tasks performed Cognition: [facility] for tasks performed  Van Clines [facility], [place] [phone]  06/29/2011, 1:09 PM

## 2011-06-30 ENCOUNTER — Encounter (HOSPITAL_COMMUNITY): Payer: Self-pay | Admitting: Orthopedic Surgery

## 2011-06-30 ENCOUNTER — Telehealth: Payer: Self-pay | Admitting: Hematology and Oncology

## 2011-06-30 LAB — BASIC METABOLIC PANEL
CO2: 26 mEq/L (ref 19–32)
Chloride: 99 mEq/L (ref 96–112)
Creatinine, Ser: 0.76 mg/dL (ref 0.50–1.35)
Potassium: 3.6 mEq/L (ref 3.5–5.1)
Sodium: 134 mEq/L — ABNORMAL LOW (ref 135–145)

## 2011-06-30 LAB — CBC
MCV: 104 fL — ABNORMAL HIGH (ref 78.0–100.0)
Platelets: 138 10*3/uL — ABNORMAL LOW (ref 150–400)
RBC: 2.47 MIL/uL — ABNORMAL LOW (ref 4.22–5.81)
WBC: 8.7 10*3/uL (ref 4.0–10.5)

## 2011-06-30 MED ORDER — BISACODYL 10 MG RE SUPP
10.0000 mg | Freq: Once | RECTAL | Status: DC
  Filled 2011-06-30: qty 1

## 2011-06-30 MED ORDER — MAGNESIUM CITRATE PO SOLN
0.5000 | Freq: Once | ORAL | Status: AC
  Administered 2011-06-30: 0.5 via ORAL
  Filled 2011-06-30: qty 296

## 2011-06-30 NOTE — Telephone Encounter (Signed)
S/w pt's wife, gave appt 08/06/11 moved from 08/05/11.

## 2011-06-30 NOTE — Progress Notes (Signed)
Patient ID: John Carter, male   DOB: Jul 06, 1930, 77 y.o.   MRN: [id number] PATIENT ID: John Carter  MRN: [id number]  DOB/AGE:  1930-05-15 / 77 y.o.  2 Days Post-Op Procedure(s) (LRB): TOTAL KNEE ARTHROPLASTY (Left)    PROGRESS NOTE Subjective: Patient is alert, oriented, no Nausea, no Vomiting, yes passing gas, no Bowel Movement. Taking PO minimal. Denies SOB, Chest or Calf Pain. Using Incentive Spirometer, PAS in place. Ambulate min, CPM 0-90 Patient reports pain as 8 on 0-10 scale  .    Objective: Vital signs in last 24 hours: Filed Vitals:   06/29/11 0546 06/29/11 1400 06/29/11 2124 06/30/11 0607  BP: 161/72 171/78 181/92 136/69  Pulse: 101 110 113 105  Temp: 99.2 F (37.3 C) 98.9 F (37.2 C) 98.2 F (36.8 C) 98.7 F (37.1 C)  TempSrc:  Oral Oral Oral  Resp: 20 18 18 18   SpO2: 95% 92% 92% 95%      Intake/Output from previous day: I/O last 3 completed shifts: In: 560 [P.O.:560] Out: 3290 [Urine:3290]   Intake/Output this shift:     LABORATORY DATA:  Basename 06/30/11 0555 06/29/11 0540  WBC 8.7 8.1  HGB 9.0* 8.8*  HCT 25.7* 25.8*  PLT 138* 143*  NA 134* 137  K 3.6 3.7  CL 99 102  CO2 26 25  BUN 13 13  CREATININE 0.76 0.70  GLUCOSE 160* 160*  GLUCAP -- --  INR -- --  CALCIUM 9.3 --    Examination: ABD soft Neurovascular intact Sensation intact distally Intact pulses distally Dorsiflexion/Plantar flexion intact Incision: scant drainage No cellulitis present} No wheezing  No palpitataion  Assessment:   2 Days Post-Op Procedure(s) (LRB): TOTAL KNEE ARTHROPLASTY (Left) ADDITIONAL DIAGNOSIS:  Acute Blood Loss Anemia, Hyponatremia, Hypertension and Sleep Apnea  Plan: PT/OT WBAT, CPM 5/hrs day until ROM 0-90 degrees, then D/C CPM DVT Prophylaxis:  Lovenox\Coumadin bridge target INR 1.5-2.0 DISCHARGE PLAN: Skilled Nursing Facility/Rehab DISCHARGE NEEDS: CPM, Walker and 3-in-1 comode seat     Dick Hark J 06/30/2011, 8:23  AM

## 2011-06-30 NOTE — Progress Notes (Signed)
Patient referred for SNF placement. Bed offers discussed and accepted bed at [facility]. Anticipate d/c tomorrow if medically stable.  FL2 on shadow chart for MD's review and signature. Patient and wife are pleased with d/c plan. Notified pt's nurse- Ivar Drape of above.  Darylene Price, BSW, 06/30/2011 3:34 PM

## 2011-06-30 NOTE — Progress Notes (Signed)
Physical Therapy Treatment Patient Details Name: John Carter MRN: [id number] DOB: Apr 14, 1930 Today's Date: 06/30/2011  PT Assessment/Plan  PT - Assessment/Plan Comments on Treatment Session: amb distance limited again today, though not by knee -- by dizziness (likely due to pain meds); discussed benefit of further rehab at SNF, pt agreeable PT Plan: Discharge plan needs to be updated PT Frequency: 7X/week Follow Up Recommendations: Skilled nursing facility Equipment Recommended: Defer to next venue PT Goals  Acute Rehab PT Goals Time For Goal Achievement: 7 days Pt will go Supine/Side to Sit: with modified independence PT Goal: Supine/Side to Sit - Progress: Progressing toward goal Pt will go Sit to Supine/Side: with modified independence PT Goal: Sit to Supine/Side - Progress: Progressing toward goal Pt will go Sit to Stand: with modified independence PT Goal: Sit to Stand - Progress: Progressing toward goal Pt will go Stand to Sit: with modified independence PT Goal: Stand to Sit - Progress: Progressing toward goal Pt will Ambulate: >150 feet;with modified independence;with rolling walker PT Goal: Ambulate - Progress: Progressing toward goal (slow progress) Pt will Go Up / Down Stairs: 1-2 stairs;with modified independence;with supervision;with rolling walker PT Goal: Up/Down Stairs - Progress: Other (comment) Pt will Perform Home Exercise Program: Independently PT Goal: Perform Home Exercise Program - Progress: Progressing toward goal  PT Treatment Precautions/Restrictions  Precautions Precautions: Knee Restrictions Weight Bearing Restrictions: Yes RLE Weight Bearing: Weight bearing as tolerated Mobility (including Balance) Bed Mobility Supine to Sit: 3: Mod assist;With rails Supine to Sit Details (indicate cue type and reason): cues to initiate and for technique; slower likely secondary to pain Sitting - Scoot to Edge of Bed: 4: Min assist;With rail Sitting -  Scoot to [place] of Bed Details (indicate cue type and reason): slower and with more assist likely secondary to pain at beginning of session; RN came in and gave IV pain meds Transfers Sit to Stand: 3: Mod assist Sit to Stand Details (indicate cue type and reason): still mod assist against gravity, though requiring less assist than yesterday Stand to Sit: 4: Min assist;With upper extremity assist;To chair/3-in-1 Stand to Sit Details: cues for control with stand to sit despite dizziness Ambulation/Gait Ambulation/Gait Assistance: 4: Min assist Ambulation/Gait Assistance Details (indicate cue type and reason): cues for sequence, for L quad activation for L stance stability; cues for upright posture; cues to self monitor for activity tolerance Ambulation Distance (Feet): 10 Feet Assistive device: Rolling walker Gait Pattern: Step-to pattern    Exercise  Total Joint Exercises Ankle Circles/Pumps: AROM;Both;10 reps;Seated Quad Sets: AROM;10 reps;Left;Supine Short Arc Quad: AROM;Left;10 reps;Supine Heel Slides: AAROM;Left;10 reps;Supine (muscle guarding secondary to pain) Straight Leg Raises: AROM;10 reps;Left;Supine End of Session PT - End of Session Equipment Utilized During Treatment: Gait belt Activity Tolerance: Patient limited by fatigue Patient left: in chair;with call bell in reach;with family/visitor present Nurse Communication: Mobility status for transfers;Mobility status for ambulation General Behavior During Session: John Carter for tasks performed Cognition: [name] for tasks performed  John Carter John Carter John Carter, John Carter [phone]  06/30/2011, 10:50 AM

## 2011-07-01 DIAGNOSIS — D62 Acute posthemorrhagic anemia: Secondary | ICD-10-CM | POA: Diagnosis not present

## 2011-07-01 LAB — CBC
HCT: 23.5 % — ABNORMAL LOW (ref 39.0–52.0)
Hemoglobin: 8 g/dL — ABNORMAL LOW (ref 13.0–17.0)
MCV: 104.9 fL — ABNORMAL HIGH (ref 78.0–100.0)
RBC: 2.24 MIL/uL — ABNORMAL LOW (ref 4.22–5.81)
WBC: 7.3 10*3/uL (ref 4.0–10.5)

## 2011-07-01 LAB — BASIC METABOLIC PANEL
BUN: 25 mg/dL — ABNORMAL HIGH (ref 6–23)
CO2: 27 mEq/L (ref 19–32)
Chloride: 98 mEq/L (ref 96–112)
Creatinine, Ser: 1.06 mg/dL (ref 0.50–1.35)
Glucose, Bld: 128 mg/dL — ABNORMAL HIGH (ref 70–99)
Potassium: 3.6 mEq/L (ref 3.5–5.1)

## 2011-07-01 MED ORDER — FUROSEMIDE 10 MG/ML IJ SOLN
20.0000 mg | Freq: Once | INTRAMUSCULAR | Status: AC
  Administered 2011-07-01: 20 mg via INTRAVENOUS
  Filled 2011-07-01: qty 2

## 2011-07-01 MED ORDER — OXYCODONE HCL 5 MG PO TABS: ORAL_TABLET | ORAL | Status: DC

## 2011-07-01 MED ORDER — DSS 100 MG PO CAPS: 100.0000 mg | ORAL_CAPSULE | Freq: Two times a day (BID) | ORAL | Status: AC

## 2011-07-01 NOTE — Progress Notes (Signed)
Physical Therapy Treatment Patient Details Name: John Carter MRN: [id number] DOB: 12-Nov-1929 Today's Date: 07/01/2011  PT Assessment/Plan  PT - Assessment/Plan Comments on Treatment Session: Pt progressed ambulation distance today, and requires less A with mobility.  PT Plan: Discharge plan needs to be updated Equipment Recommended: Defer to next venue PT Goals  Acute Rehab PT Goals PT Goal: Supine/Side to Sit - Progress: Progressing toward goal PT Goal: Sit to Supine/Side - Progress: Progressing toward goal PT Goal: Sit to Stand - Progress: Progressing toward goal PT Goal: Stand to Sit - Progress: Progressing toward goal PT Goal: Ambulate - Progress: Progressing toward goal PT Goal: Up/Down Stairs - Progress: Not met PT Goal: Perform Home Exercise Program - Progress: Progressing toward goal  PT Treatment Precautions/Restrictions  Precautions Precautions: Knee Restrictions Weight Bearing Restrictions: Yes RLE Weight Bearing: Weight bearing as tolerated Mobility (including Balance) Bed Mobility Bed Mobility: Yes Supine to Sit: 5: Supervision;HOB elevated (Comment degrees);With rails (HOB 30deg) Supine to Sit Details (indicate cue type and reason): Cues to initiate Sitting - Scoot to Edge of Bed: 4: Min assist;With rail Sitting - Scoot to [place] of Bed Details (indicate cue type and reason): cues for sitting straight Transfers Transfers: Yes Sit to Stand: 4: Min assist;With upper extremity assist;From bed Sit to Stand Details (indicate cue type and reason): Min A for lifting, cues for hand placement Stand to Sit: 4: Min assist;With upper extremity assist;To chair/3-in-1 Stand to Sit Details: Cues for hand placement Ambulation/Gait Ambulation/Gait Assistance: 4: Min assist Ambulation/Gait Assistance Details (indicate cue type and reason): Min A for steadying, cues for gait pattern and posture after each step Ambulation Distance (Feet): 30 Feet Assistive device: Rolling  walker Gait Pattern: Step-to pattern;Trunk flexed Stairs: No    Exercise  Total Joint Exercises Ankle Circles/Pumps: AROM;Strengthening;Both;20 reps;Supine Quad Sets: AROM;Strengthening;Both;20 reps;Supine Heel Slides: AAROM;Left;10 reps;Supine Straight Leg Raises: AROM;10 reps;Left;Supine Long Arc Quad: AROM;Strengthening;Left;10 reps;Seated End of Session PT - End of Session Equipment Utilized During Treatment: Gait belt Activity Tolerance: Patient limited by fatigue Patient left: in chair;with call bell in reach;with family/visitor present Nurse Communication: Mobility status for transfers;Mobility status for ambulation General Behavior During Session: [facility] for tasks performed Cognition: [facility] for tasks performed  [facility] [place],  [phone] 07/01/2011, 2:43 PM

## 2011-07-01 NOTE — Discharge Summary (Signed)
Physician Discharge Summary  Patient ID: John Carter MRN: [id number] DOB/AGE: [birth year] 75 y.o.  Admit date: 06/28/2011 Discharge date: 07/01/2011  Admission Diagnoses: Patient Active Problem List  Diagnoses  . Multiple myeloma  . Melanoma  . Hypertension  . Hyperlipidemia  . Coronary artery disease  . Myocardial infarction  . Sleep apnea  . TIA (transient ischemic attack)  . Arthritis  . Chronic back pain  . GERD (gastroesophageal reflux disease)  . Hiatal hernia  . Gastric ulcer  . Diverticulosis  . Hemorrhoids  . History of colonic polyps  . Urinary frequency  . Nocturia  . Leg cramps  . Cataracts, bilateral  . Postoperative anemia due to acute blood loss   Discharge Diagnoses:  Principal Problem:  *Arthritis Active Problems:  Hypertension  Coronary artery disease  Sleep apnea  GERD (gastroesophageal reflux disease)  Postoperative anemia due to acute blood loss   Discharged Condition: good  Hospital Course: John Carter had a left total knee replacement on 1210 2012th by Dr. Thurston Carter. Preoperatively he had a femoral nerve block by anesthesia.  Postoperatively he had an Autovac transfusion of 500 cc. He tolerated all of these procedures well. In the recovery room he was placed in a CPM 0-90.  He tolerated that for 6 hours on postop day 0.  Postop day 1 patient was somnolent and slow to progress.   Because of this we felt that a social work consult was needed for possible skilled nursing facility placement.  This was discussed in detail with the patient and his wife in and were in agreement. Postop day 2 patient was more alert still ambulating slowly still needing 1-2+ assist.  Surgical wound was well approximated and healing. Hhis hemoglobin was 9.o and he was metabolically stable.  Postop day 3 patient's hemoglobin was 8.0.    He tolerated the CPAP well.  His surgical wound is well approximated and healing. He is ambulating 1+ assist.    Due to his acute blood  loss anemia secondary to surgery, we have recommended transfusion of 2 units of packed red blood cells with 20 milligrams of Lasix IV between units.  Following the transfusion, we are planning on discharging him to skilled nursing facility he will be weightbearing as tolerated on a regular diet. He will need followup labs from his transfusion with a CBC needing to be done tomorrow which is 12-14 2012.   Results need to be faxed to Dr. Thurston Carter in [phone].   he needs daily physical therapy occupational therapy. He needs to be in his yellow foam block elevating his left heel at all times except when walking and in a CPM.  Consults: none  Significant Diagnostic Studies:  Results for orders placed during the hospital encounter of 06/28/11 (from the past 72 hour(s))  CBC     Status: Abnormal   Collection Time   06/29/11  5:40 AM      Component Value Range Comment   WBC 8.1  4.0 - 10.5 (K/uL)    RBC 2.48 (*) 4.22 - 5.81 (MIL/uL)    Hemoglobin 8.8 (*) 13.0 - 17.0 (g/dL)    HCT 30.8 (*) 65.7 - 52.0 (%)    MCV 104.0 (*) 78.0 - 100.0 (fL)    MCH 35.5 (*) 26.0 - 34.0 (pg)    MCHC 34.1  30.0 - 36.0 (g/dL)    RDW 84.6  96.2 - 95.2 (%)    Platelets 143 (*) 150 - 400 (K/uL)   BASIC METABOLIC PANEL  Status: Abnormal   Collection Time   06/29/11  5:40 AM      Component Value Range Comment   Sodium 137  135 - 145 (mEq/L)    Potassium 3.7  3.5 - 5.1 (mEq/L)    Chloride 102  96 - 112 (mEq/L)    CO2 25  19 - 32 (mEq/L)    Glucose, Bld 160 (*) 70 - 99 (mg/dL)    BUN 13  6 - 23 (mg/dL)    Creatinine, Ser 1.61  0.50 - 1.35 (mg/dL)    Calcium 8.1 (*) 8.4 - 10.5 (mg/dL)    GFR calc non Af Amer 87 (*) >90 (mL/min)    GFR calc Af Amer >90  >90 (mL/min)   CBC     Status: Abnormal   Collection Time   06/30/11  5:55 AM      Component Value Range Comment   WBC 8.7  4.0 - 10.5 (K/uL)    RBC 2.47 (*) 4.22 - 5.81 (MIL/uL)    Hemoglobin 9.0 (*) 13.0 - 17.0 (g/dL)    HCT 09.6 (*) 04.5 - 52.0 (%)    MCV  104.0 (*) 78.0 - 100.0 (fL)    MCH 36.4 (*) 26.0 - 34.0 (pg)    MCHC 35.0  30.0 - 36.0 (g/dL)    RDW 40.9  81.1 - 91.4 (%)    Platelets 138 (*) 150 - 400 (K/uL)   BASIC METABOLIC PANEL     Status: Abnormal   Collection Time   06/30/11  5:55 AM      Component Value Range Comment   Sodium 134 (*) 135 - 145 (mEq/L)    Potassium 3.6  3.5 - 5.1 (mEq/L)    Chloride 99  96 - 112 (mEq/L)    CO2 26  19 - 32 (mEq/L)    Glucose, Bld 160 (*) 70 - 99 (mg/dL)    BUN 13  6 - 23 (mg/dL)    Creatinine, Ser 7.82  0.50 - 1.35 (mg/dL)    Calcium 9.3  8.4 - 10.5 (mg/dL)    GFR calc non Af Amer 85 (*) >90 (mL/min)    GFR calc Af Amer >90  >90 (mL/min)   CBC     Status: Abnormal   Collection Time   07/01/11  6:15 AM      Component Value Range Comment   WBC 7.3  4.0 - 10.5 (K/uL)    RBC 2.24 (*) 4.22 - 5.81 (MIL/uL)    Hemoglobin 8.0 (*) 13.0 - 17.0 (g/dL)    HCT 95.6 (*) 21.3 - 52.0 (%)    MCV 104.9 (*) 78.0 - 100.0 (fL)    MCH 35.7 (*) 26.0 - 34.0 (pg)    MCHC 34.0  30.0 - 36.0 (g/dL)    RDW 08.6  57.8 - 46.9 (%)    Platelets 123 (*) 150 - 400 (K/uL)   BASIC METABOLIC PANEL     Status: Abnormal   Collection Time   07/01/11  6:15 AM      Component Value Range Comment   Sodium 132 (*) 135 - 145 (mEq/L)    Potassium 3.6  3.5 - 5.1 (mEq/L)    Chloride 98  96 - 112 (mEq/L)    CO2 27  19 - 32 (mEq/L)    Glucose, Bld 128 (*) 70 - 99 (mg/dL)    BUN 25 (*) 6 - 23 (mg/dL) DELTA CHECK NOTED   Creatinine, Ser 1.06  0.50 - 1.35 (mg/dL)  Calcium 8.6  8.4 - 10.5 (mg/dL)    GFR calc non Af Amer 65 (*) >90 (mL/min)    GFR calc Af Amer 75 (*) >90 (mL/min)   PREPARE RBC (CROSSMATCH)     Status: Normal   Collection Time   07/01/11  8:01 AM      Component Value Range Comment   Order Confirmation ORDER PROCESSED BY BLOOD BANK       Treatments: IV hydration, antibiotics: Ancef, analgesia: Dilaudid and oxyIR, anticoagulation: ASA and Plavix, therapies: PT, OT, RN and SW, procedures: femoral nerve block,  autovac transfusion, and transfusion of 2 units of packed RBC's on 08/31/2010 and surgery: Left  Total knee replacement  Discharge Exam: Blood pressure 108/51, pulse 97, temperature 99.5 F (37.5 C), temperature source Oral, resp. rate 18, SpO2 93.00%. General appearance: alert and cooperative Resp: clear to auscultation bilaterally Extremities: left leg -10 to 90 degrees wound well approximated Pulses: 2+ and symmetric Skin: Skin color, texture, turgor normal. No rashes or lesions Incision/Wound:well approximate, DNVI  Disposition: pt is stable for discharge to Skilled Nursing Facility  Discharge Orders    Future Appointments: Provider: Department: Dept Phone: Center:   07/30/2011 1:30 PM Krista Blue [facility] [phone] None   08/06/2011 11:30 AM Lauretta I. Odogwu, MD [facility] [phone] None     Future Orders Please Complete By Expires   Diet - low sodium heart healthy      Call MD / Call 911      Comments:   If you experience chest pain or shortness of breath, CALL 911 and be transported to the hospital emergency room.  If you develope a fever above 101 F, pus (white drainage) or increased drainage or redness at the wound, or calf pain, call your surgeon's office.   Constipation Prevention      Comments:   Drink plenty of fluids.  Prune juice may be helpful.  You may use a stool softener, such as Colace (over the counter) 100 mg twice a day.  Use MiraLax (over the counter) for constipation as needed.   Increase activity slowly as tolerated      Weight Bearing as taught in Physical Therapy      Comments:   Use a walker or crutches as instructed.   Discharge instructions      Comments:   Total Knee Replacement Care After Refer to this sheet in the next few weeks. These discharge instructions provide you with general information on caring for yourself after you leave the hospital. Your caregiver may also give you specific instructions. Your treatment has been  planned according to the most current medical practices available, but unavoidable complications sometimes occur. If you have any problems or questions after discharge, please call your caregiver. Regaining a near full range of motion of your knee within the first 3 to 6 weeks after surgery is critical. HOME CARE INSTRUCTIONS  You may resume a normal diet and activities as directed. Perform exercises as directed.  You will receive physical therapy as directed by your caregiver.  Take showers instead of baths until informed otherwise.  Change bandages (dressings) if necessary or as directed.  Only take over-the-counter or prescription medicines for pain, discomfort, or fever as directed by your caregiver.  Eat a well-balanced diet.  Avoid lifting or driving until you are instructed otherwise.  Make an appointment to see your caregiver for stitches (suture) or staple removal as directed.  If you have been sent home with a continuous  passive motion machine (CPM machine), use as directed.  SEEK MEDICAL CARE IF: You have swelling of your calf or leg.  You develop shortness of breath or chest pain.  You have redness, swelling, or increasing pain in the wound.  There is pus or any unusual drainage coming from the surgical site.  You notice a bad smell coming from the surgical site or dressing.  The surgical site breaks open after sutures or staples have been removed.  There is persistent bleeding from the suture or staple line.  You are getting worse or are not improving.  You have any other questions or concerns.  SEEK IMMEDIATE MEDICAL CARE IF:  You have a fever.  You develop a rash.  You have difficulty breathing.  You develop any reaction or side effects to medicines given.  Your knee motion is decreasing rather than improving.  MAKE SURE YOU:  Understand these instructions.  Will watch your condition.  Will get help right away if you are not doing well or get worse.  Document Released:  01/22/2005 Document Revised: 03/17/2011 Document Reviewed: 05/07/2009 [facility] Patient Information 2012 [place], [place].   Driving restrictions      Comments:   No driving for 4 weeks   Lifting restrictions      Comments:   No lifting for 4 weeks   CPM      Comments:   Continuous passive motion machine (CPM):      Use the CPM from 0 to 90 for 6 hours per day.      You may break it up into 2 or 3 sessions per day.      Use CPM for 2 weeks or until you are told to stop. Place yellow foam block, yellow side up under heel at all times except when in CPM or when walking.  DO NOT modify, tear, cut, or change in any way the yellow foam block.   TED hose      Comments:   Use stockings (TED hose) for 4 weeks on both leg(s).  You may remove them at night for sleeping.   Change dressing      Comments:   Change the dressing daily with sterile 4 x 4 inch gauze dressing and apply TED hose.  You may clean the incision with alcohol prior to redressing.   Do not put a pillow under the knee. Place it under the heel.      Scheduling Instructions:   Place yellow foam block, yellow side up under heel at all times except when in CPM or when walking.  DO NOT modify, tear, cut, or change in any way the yellow foam block.     Current Discharge Medication List    START taking these medications   Details  docusate sodium 100 MG CAPS Take 100 mg by mouth 2 (two) times daily. Qty: 60 capsule, Refills: 0    oxyCODONE (OXY IR/ROXICODONE) 5 MG immediate release tablet 1-2 po q 4-6 hrs prn pain Qty: 60 tablet, Refills: 0      CONTINUE these medications which have NOT CHANGED   Details  amLODipine (NORVASC) 5 MG tablet Take 5 mg by mouth daily.      aspirin 325 MG EC tablet Take 325 mg by mouth daily.      cetirizine-pseudoephedrine (ZYRTEC-D) 5-120 MG per tablet Take 1 tablet by mouth 2 (two) times daily.      clopidogrel (PLAVIX) 75 MG tablet Take 75 mg by mouth daily.  flunisolide (NASAREL) 29  MCG/ACT (0.025%) nasal spray Place 2 sprays into the nose at bedtime. Dose is for each nostril.     metoprolol (TOPROL-XL) 50 MG 24 hr tablet Take 50 mg by mouth daily.      Multiple Vitamins-Minerals (MULTIVITAMINS THER. W/MINERALS) TABS Take 1 tablet by mouth daily.      omeprazole (PRILOSEC) 20 MG capsule Take 20 mg by mouth daily.      simvastatin (ZOCOR) 20 MG tablet Take 20 mg by mouth at bedtime.      valsartan (DIOVAN) 320 MG tablet Take 320 mg by mouth daily.      Vitamin D, Ergocalciferol, (DRISDOL) 50000 UNITS CAPS Take 100,000 Units by mouth every 30 (thirty) days.      nitroGLYCERIN (NITROSTAT) 0.4 MG SL tablet Place 0.4 mg under the tongue every 5 (five) minutes as needed. For chest pain       STOP taking these medications     acetaminophen (TYLENOL) 500 MG tablet      glucosamine-chondroitin 500-400 MG tablet      traMADol (ULTRAM) 50 MG tablet        Follow-up Information    Follow up with Nilda Simmer, MD on 07/15/2011. (9:15 am)    Contact information:   [facility] [address] [place] [address] [phone]        Harryette Shuart A. Gwinda Passe Physician Assistant [facility] [phone]  07/01/2011, 8:22 AM  Signed: Pascal Lux 07/01/2011, 8:01 AM

## 2011-07-01 NOTE — Progress Notes (Signed)
Patient is ok for dc today to [facility] for SNF rehab via EMS after he receives 2 units of blood. Ok per facility, patient and notified pt's nurse.  DC summary sent to facility for review.   Darylene Price, BSW,07/01/2011 12:29 PM

## 2011-07-02 ENCOUNTER — Other Ambulatory Visit: Payer: Medicare Other | Admitting: Lab

## 2011-07-02 LAB — TYPE AND SCREEN
ABO/RH(D): O POS
Antibody Screen: NEGATIVE
Unit division: 0

## 2011-07-29 ENCOUNTER — Other Ambulatory Visit: Payer: Self-pay | Admitting: Hematology and Oncology

## 2011-07-29 ENCOUNTER — Ambulatory Visit (HOSPITAL_BASED_OUTPATIENT_CLINIC_OR_DEPARTMENT_OTHER): Payer: Medicare Other

## 2011-07-29 DIAGNOSIS — Z5112 Encounter for antineoplastic immunotherapy: Secondary | ICD-10-CM

## 2011-07-29 DIAGNOSIS — C9000 Multiple myeloma not having achieved remission: Secondary | ICD-10-CM

## 2011-07-29 LAB — CBC WITH DIFFERENTIAL/PLATELET
BASO%: 0.4 % (ref 0.0–2.0)
EOS%: 1 % (ref 0.0–7.0)
Eosinophils Absolute: 0.1 10*3/uL (ref 0.0–0.5)
MCV: 105.4 fL — ABNORMAL HIGH (ref 79.3–98.0)
MONO%: 11 % (ref 0.0–14.0)
NEUT#: 3.5 10*3/uL (ref 1.5–6.5)
RBC: 2.82 10*6/uL — ABNORMAL LOW (ref 4.20–5.82)
RDW: 15.3 % — ABNORMAL HIGH (ref 11.0–14.6)

## 2011-07-30 ENCOUNTER — Other Ambulatory Visit: Payer: Medicare Other | Admitting: Lab

## 2011-08-02 ENCOUNTER — Encounter: Payer: Self-pay | Admitting: *Deleted

## 2011-08-02 LAB — COMPREHENSIVE METABOLIC PANEL
ALT: 17 U/L (ref 0–53)
AST: 20 U/L (ref 0–37)
Albumin: 3.6 g/dL (ref 3.5–5.2)
Alkaline Phosphatase: 104 U/L (ref 39–117)
Glucose, Bld: 120 mg/dL — ABNORMAL HIGH (ref 70–99)
Potassium: 4 mEq/L (ref 3.5–5.3)
Sodium: 136 mEq/L (ref 135–145)
Total Protein: 8.8 g/dL — ABNORMAL HIGH (ref 6.0–8.3)

## 2011-08-02 LAB — SPEP & IFE WITH QIG
Albumin ELP: 43.2 % — ABNORMAL LOW (ref 55.8–66.1)
Alpha-2-Globulin: 10.2 % (ref 7.1–11.8)
Beta Globulin: 4.1 % — ABNORMAL LOW (ref 4.7–7.2)
Total Protein, Serum Electrophoresis: 8.8 g/dL — ABNORMAL HIGH (ref 6.0–8.3)

## 2011-08-05 ENCOUNTER — Ambulatory Visit: Payer: Medicare Other | Admitting: Physician Assistant

## 2011-08-06 ENCOUNTER — Ambulatory Visit (HOSPITAL_BASED_OUTPATIENT_CLINIC_OR_DEPARTMENT_OTHER): Payer: Medicare Other | Admitting: Hematology and Oncology

## 2011-08-06 DIAGNOSIS — D539 Nutritional anemia, unspecified: Secondary | ICD-10-CM

## 2011-08-06 DIAGNOSIS — C9000 Multiple myeloma not having achieved remission: Secondary | ICD-10-CM

## 2011-08-06 MED ORDER — DARBEPOETIN ALFA-POLYSORBATE 300 MCG/0.6ML IJ SOLN
300.0000 ug | Freq: Once | INTRAMUSCULAR | Status: AC
  Administered 2011-08-06: 300 ug via SUBCUTANEOUS
  Filled 2011-08-06: qty 0.6

## 2011-08-06 NOTE — Progress Notes (Signed)
CC:   John Carter, M.D. John Carter Section, M.D.  IDENTIFYING STATEMENT:  The patient is a 78 year old man with an IgG kappa multiple myeloma who presents for followup.  INTERVAL HISTORY:  John Carter recently underwent left knee surgery on 06/28/2011.  He has history of an IgG kappa multiple myeloma and received treatment in the past.  He notes that during his surgery he required 2 units of packed red blood cells for anemia.  He does note some mild fatigue.  He has a few more weeks of rehab.  He otherwise has no other complaints.  He has not lost any weight.  He denies bone pain and night sweats.  Myeloma workup results are as follows:  07/2011 hemoglobin and hematocrit 10.2 and 29.7 (10.6 and 30.8) respectively, platelets 198; BUN and creatinine 31 and 1.22 (27 and 3.95); beta 2 microglobulin 4.50 (2.8); IgG 3450 (2430), IgA 29 (29), IgM 20 (19); M spike showed IgG kappa protein measuring 2.96 g/dL (1.61 g/dL) and total protein was 8.8 (7.5).  MEDICATIONS:  Reviewed and updated.  ALLERGIES:  Betadine and nedocromil sodium.  REVIEW OF SYSTEMS:  As above.  In addition 10 point review of systems negative.  PHYSICAL EXAMINATION:  General:  The patient is a well-appearing, well- nourished man in no distress.  Vital signs:  Pulse 82, blood pressure 124/66, temperature 97.8, respirations 18, weight 186 pounds.  HEENT: Head is atraumatic, normocephalic.  Sclerae anicteric.  Mouth moist. Neck:  Supple.  Chest:  Clear.  CVS:  Unremarkable.  Abdomen:  Soft, nontender.  Bowel sounds present.  Extremities:  Trace edema.  Lymphs: No adenopathy.  CNS:  Nonfocal.  LAB DATA:  As above.  In addition, white count 5.5, sodium 136, potassium 4, chloride 100, CO2 22, glucose 120, T bilirubin 0.2, alkaline phosphatase 104, AST 20, ALT 17.  IMPRESSION AND PLAN:  John Carter is a 78 year old man with an IgG kappa multiple myeloma with bone marrow biopsy revealing 48% plasma cells, diagnosed on  10/21/2009.  He received 9 months of melphalan, prednisone and subcutaneous Velcade from April 2011 through March 2012 with near complete response to therapy. His current markers indicate progressive disease 9 months since his last treatment.  He does not have renal involvement or hypercalcemia. We discussed getting him back on treatment.  I recommend Revlimid with an initial dose of 25 mg p.o. daily for 3 weeks on and 1 week off with weekly Decadron 20 mg to treat for total of 4 cycles and then re-stage.  We discussed side effects of therapy which includes neutropenia, anemia/thrombocytopenia, blood clots, diarrhea, muscle cramps, mucositis.  The patient will be given literature to review.  John Carter is keen to proceed with therapy.  He understands that treatment Is not curative but he wants control as much as possible.  He also has anemia of chronic disease and is status post left knee surgery for osteoarthritis.  I think he will also be a good candidate for Aranesp 300 mcg dosed every 3 weeks.  He follows up soon.   ______________________________ Laurice Record, M.D. LIO/MEDQ  D:  08/06/2011  T:  08/06/2011  Job:  [id number]

## 2011-08-06 NOTE — Progress Notes (Signed)
Provide pt drug info on Revlimid and  Aranesp today 08/06/11.   Pt also registered for Revlimid today.    Pt signed consent for Aranesp as well.

## 2011-08-06 NOTE — Progress Notes (Signed)
This office note has been dictated.

## 2011-08-12 ENCOUNTER — Other Ambulatory Visit: Payer: Self-pay | Admitting: *Deleted

## 2011-08-17 ENCOUNTER — Telehealth: Payer: Self-pay | Admitting: Nurse Practitioner

## 2011-08-17 ENCOUNTER — Telehealth: Payer: Self-pay | Admitting: Hematology and Oncology

## 2011-08-17 ENCOUNTER — Other Ambulatory Visit: Payer: Self-pay | Admitting: Hematology and Oncology

## 2011-08-17 NOTE — Telephone Encounter (Signed)
Spoke with patient.  Instructed to start Revlimid and Decadron tomorrow.  Decadron once a week.  Revlimid three weeks on, 1 week off.  To see Dr. Dalene Carrow with labs on 09/03/11 @ 1500 (lab) and 1530 (MD appt).  Pt verbalized understanding.

## 2011-08-17 NOTE — Telephone Encounter (Signed)
S/w pt re appt for 2/15 @ 3 pm.

## 2011-08-17 NOTE — Telephone Encounter (Signed)
Patient called and states he received Revlimid and decadron today.  Inquiring on when to start treatment.

## 2011-09-01 ENCOUNTER — Other Ambulatory Visit: Payer: Self-pay | Admitting: *Deleted

## 2011-09-01 ENCOUNTER — Encounter: Payer: Self-pay | Admitting: *Deleted

## 2011-09-01 DIAGNOSIS — C9000 Multiple myeloma not having achieved remission: Secondary | ICD-10-CM

## 2011-09-01 MED ORDER — LENALIDOMIDE 25 MG PO CAPS: 25.0000 mg | ORAL_CAPSULE | Freq: Every day | ORAL | Status: DC

## 2011-09-01 NOTE — Progress Notes (Signed)
[facility] faxed refill request for Revlimid.  Request to MD for review.

## 2011-09-03 ENCOUNTER — Telehealth: Payer: Self-pay | Admitting: Hematology and Oncology

## 2011-09-03 ENCOUNTER — Other Ambulatory Visit: Payer: Medicare Other | Admitting: Lab

## 2011-09-03 ENCOUNTER — Ambulatory Visit (HOSPITAL_BASED_OUTPATIENT_CLINIC_OR_DEPARTMENT_OTHER): Payer: Medicare Other | Admitting: Hematology and Oncology

## 2011-09-03 VITALS — BP 154/76 | HR 89 | Temp 98.4°F | Ht 68.0 in | Wt 191.0 lb

## 2011-09-03 DIAGNOSIS — C9000 Multiple myeloma not having achieved remission: Secondary | ICD-10-CM

## 2011-09-03 DIAGNOSIS — D539 Nutritional anemia, unspecified: Secondary | ICD-10-CM

## 2011-09-03 LAB — COMPREHENSIVE METABOLIC PANEL
Albumin: 3.5 g/dL (ref 3.5–5.2)
Alkaline Phosphatase: 83 U/L (ref 39–117)
BUN: 32 mg/dL — ABNORMAL HIGH (ref 6–23)
CO2: 21 mEq/L (ref 19–32)
Glucose, Bld: 143 mg/dL — ABNORMAL HIGH (ref 70–99)
Potassium: 3.6 mEq/L (ref 3.5–5.3)
Total Protein: 7.3 g/dL (ref 6.0–8.3)

## 2011-09-03 LAB — CBC WITH DIFFERENTIAL/PLATELET
Basophils Absolute: 0 10*3/uL (ref 0.0–0.1)
Eosinophils Absolute: 0 10*3/uL (ref 0.0–0.5)
HGB: 9.2 g/dL — ABNORMAL LOW (ref 13.0–17.1)
LYMPH%: 16.2 % (ref 14.0–49.0)
MCV: 105.8 fL — ABNORMAL HIGH (ref 79.3–98.0)
MONO#: 1 10*3/uL — ABNORMAL HIGH (ref 0.1–0.9)
MONO%: 15.5 % — ABNORMAL HIGH (ref 0.0–14.0)
NEUT#: 4.5 10*3/uL (ref 1.5–6.5)
Platelets: 128 10*3/uL — ABNORMAL LOW (ref 140–400)
RDW: 15.3 % — ABNORMAL HIGH (ref 11.0–14.6)
WBC: 6.6 10*3/uL (ref 4.0–10.3)

## 2011-09-03 MED ORDER — DARBEPOETIN ALFA-POLYSORBATE 300 MCG/0.6ML IJ SOLN
300.0000 ug | Freq: Once | INTRAMUSCULAR | Status: AC
  Administered 2011-09-03: 300 ug via SUBCUTANEOUS
  Filled 2011-09-03: qty 0.6

## 2011-09-03 NOTE — Progress Notes (Signed)
CC:   [name], M.D.       Marina Gravel, M.D.  IDENTIFYING STATEMENT:  The patient is a 78 year old man with IgG kappa multiple myeloma who presents for followup.  INTERVAL HISTORY:  John Carter began Revlimid and Decadron for progression of multiple myeloma on 08/19/2011.  He has almost completed his first cycle.  He notes  queasiness and insomnia.  This is probably related to Decadron.  He is also a little bit more fatigued.  He denies diarrhea.  He denies mucositis.  He has no skin rashes or petechia.  MEDICATIONS: 1. Revlimid 25 mg daily for 21 days with a week off. 2. Decadron 20 mg weekly. 3. The rest of the medicines are reviewed and updated.  ALLERGIES:  SULFA.  PAST MEDICAL HISTORY:  Unchanged.  FAMILY HISTORY:  Unchanged.  SOCIAL HISTORY:  Unchanged.  REVIEW OF SYSTEMS:  As above and the rest of the review of systems is negative.  PHYSICAL EXAMINATION:  General Appearance:  The patient is alert and oriented x3.  Vital Signs:  Pulse 89.  Blood pressure 152/76. Temperature 98.4.  Respirations 18.  Weight 191 pounds.  HEENT:  Head is atraumatic, normocephalic.  Sclerae are anicteric.  Conjunctiva are pale.  Mouth:  No thrush.  Neck:  Supple.  Chest:  Clear.  CVS:  First and second heart sounds.  No added sounds or murmurs.  Abdomen:  Soft, nontender.  Extremities:  Minimal edema.  Pulses are present and symmetrical.  LABORATORY DATA:  On 09/03/2011, white cell count 6.6, hemoglobin 9.2, hematocrit 36.9, platelets 128.  CMET is pending.  IMPRESSION AND PLAN: 1. John Carter is a 78 year old man with an IgG kappa multiple     myeloma with bone marrow biopsy revealing 48% plasma cells when     diagnosed on 10/21/2009:  He received 9 months of melphalan,     prednisone, and subcutaneous Velcade from April 2011 through March     2012 with near complete response to therapy.  He was found to have     progression without renal involvement or hypercalcemia.  He was    initiated on Revlimid and weekly Decadron.  He completes cycle 1     this week.  He will return for lab work in a week's time.  If his     counts begin to trend downward, we will have to dose adjust     Revlimid.  I have asked that he decrease his Decadron to 12 mg     weekly, as I feel this is the potential culprit for his insomnia     and gastrointestinal upset.  I also recommend an antacid with     Decadron. 2. Anemia of a chronic disease:  The patient will continue with     Aranesp 300 mcg every 3 weeks.       The patient follows up in 3 weeks'time. Spent more than half the time    coordinating care.    ______________________________ Laurice Record, M.D. LIO/MEDQ  D:  09/03/2011  T:  09/03/2011  Job:  [id number]

## 2011-09-03 NOTE — Progress Notes (Signed)
This office note has been dictated.

## 2011-09-03 NOTE — Telephone Encounter (Signed)
appt made for 2/20 2/27 lab 3/8 inj and 3/29 lab/visit printed

## 2011-09-08 ENCOUNTER — Other Ambulatory Visit (HOSPITAL_BASED_OUTPATIENT_CLINIC_OR_DEPARTMENT_OTHER): Payer: Medicare Other | Admitting: Lab

## 2011-09-08 ENCOUNTER — Telehealth: Payer: Self-pay | Admitting: *Deleted

## 2011-09-08 DIAGNOSIS — C9000 Multiple myeloma not having achieved remission: Secondary | ICD-10-CM

## 2011-09-08 LAB — CBC WITH DIFFERENTIAL/PLATELET
Basophils Absolute: 0 10*3/uL (ref 0.0–0.1)
EOS%: 3.3 % (ref 0.0–7.0)
Eosinophils Absolute: 0.1 10*3/uL (ref 0.0–0.5)
HGB: 9.3 g/dL — ABNORMAL LOW (ref 13.0–17.1)
MCHC: 34.3 g/dL (ref 32.0–36.0)
MCV: 106.3 fL — ABNORMAL HIGH (ref 79.3–98.0)
MONO#: 0.4 10*3/uL (ref 0.1–0.9)
MONO%: 11.9 % (ref 0.0–14.0)
NEUT#: 2.1 10*3/uL (ref 1.5–6.5)
Platelets: 127 10*3/uL — ABNORMAL LOW (ref 140–400)
RBC: 2.54 10*6/uL — ABNORMAL LOW (ref 4.20–5.82)
RDW: 14.9 % — ABNORMAL HIGH (ref 11.0–14.6)
WBC: 3.6 10*3/uL — ABNORMAL LOW (ref 4.0–10.3)
lymph#: 0.9 10*3/uL (ref 0.9–3.3)

## 2011-09-08 NOTE — Telephone Encounter (Signed)
Spoke with pt at home and informed pt re:   Labs ok today as per md.

## 2011-09-14 ENCOUNTER — Other Ambulatory Visit: Payer: Self-pay | Admitting: Nurse Practitioner

## 2011-09-14 MED ORDER — DEXAMETHASONE 4 MG PO TABS: 12.0000 mg | ORAL_TABLET | ORAL | Status: DC

## 2011-09-15 ENCOUNTER — Other Ambulatory Visit (HOSPITAL_BASED_OUTPATIENT_CLINIC_OR_DEPARTMENT_OTHER): Payer: Medicare Other | Admitting: Lab

## 2011-09-15 ENCOUNTER — Telehealth: Payer: Self-pay | Admitting: Nurse Practitioner

## 2011-09-15 DIAGNOSIS — E8809 Other disorders of plasma-protein metabolism, not elsewhere classified: Secondary | ICD-10-CM

## 2011-09-15 DIAGNOSIS — C9000 Multiple myeloma not having achieved remission: Secondary | ICD-10-CM

## 2011-09-15 LAB — CBC WITH DIFFERENTIAL/PLATELET
Basophils Absolute: 0 10*3/uL (ref 0.0–0.1)
Eosinophils Absolute: 0 10*3/uL (ref 0.0–0.5)
HCT: 31.5 % — ABNORMAL LOW (ref 38.4–49.9)
HGB: 10.6 g/dL — ABNORMAL LOW (ref 13.0–17.1)
LYMPH%: 11.3 % — ABNORMAL LOW (ref 14.0–49.0)
MCV: 106.2 fL — ABNORMAL HIGH (ref 79.3–98.0)
MONO#: 0.1 10*3/uL (ref 0.1–0.9)
MONO%: 2 % (ref 0.0–14.0)
NEUT#: 3.2 10*3/uL (ref 1.5–6.5)
NEUT%: 86.6 % — ABNORMAL HIGH (ref 39.0–75.0)
Platelets: 136 10*3/uL — ABNORMAL LOW (ref 140–400)
RBC: 2.97 10*6/uL — ABNORMAL LOW (ref 4.20–5.82)
WBC: 3.7 10*3/uL — ABNORMAL LOW (ref 4.0–10.3)

## 2011-09-15 LAB — COMPREHENSIVE METABOLIC PANEL
Albumin: 3.7 g/dL (ref 3.5–5.2)
Alkaline Phosphatase: 92 U/L (ref 39–117)
BUN: 24 mg/dL — ABNORMAL HIGH (ref 6–23)
CO2: 20 mEq/L (ref 19–32)
Glucose, Bld: 227 mg/dL — ABNORMAL HIGH (ref 70–99)
Sodium: 138 mEq/L (ref 135–145)
Total Bilirubin: 0.3 mg/dL (ref 0.3–1.2)
Total Protein: 7.2 g/dL (ref 6.0–8.3)

## 2011-09-15 NOTE — Telephone Encounter (Signed)
Addendum to previous note- pt taking Revlimid 25mg  daily x 3 weeks, 1 week off.  With dexamethasone 12mg  weekly.

## 2011-09-15 NOTE — Telephone Encounter (Signed)
Called pt per Dr. Dalene Carrow to instruct - labs ok- ok to begin Revlimid tomorrow.    Pt stated he actually started today and had already taken pills.    RN instructed pt- next cycle- wait to hear from MD re: lab results before starting.  Pt verbalized understanding.

## 2011-09-16 ENCOUNTER — Telehealth: Payer: Self-pay | Admitting: Nurse Practitioner

## 2011-09-16 NOTE — Telephone Encounter (Signed)
Informed pt that his blood glucose was elevated.  Per Dr. Dalene Carrow- likely due to decadron, however, Dr. Dalene Carrow would like for pt primary MD to follow up.  Lab results routed to Dr. Unice Cobble with followup request.  Pt verbalized understanding.

## 2011-09-20 ENCOUNTER — Telehealth: Payer: Self-pay | Admitting: *Deleted

## 2011-09-20 NOTE — Telephone Encounter (Signed)
Pt called and stated he had eaten candy before lab drawn on 2/27 -  Pt stated that was the cause for his increased glucose level.   Pt rechecked his glucose on Sat  With result of  121  ;   Sund.   With result  Of  130.   Pt BP was   137/76.   Pt stated he would like to wait before calling his primary Dr. Earl Gala.   Pt's  Phone    [phone].

## 2011-09-23 ENCOUNTER — Other Ambulatory Visit: Payer: Self-pay | Admitting: *Deleted

## 2011-09-24 ENCOUNTER — Other Ambulatory Visit (HOSPITAL_BASED_OUTPATIENT_CLINIC_OR_DEPARTMENT_OTHER): Payer: Medicare Other | Admitting: Lab

## 2011-09-24 ENCOUNTER — Ambulatory Visit: Payer: Medicare Other

## 2011-09-24 DIAGNOSIS — C9000 Multiple myeloma not having achieved remission: Secondary | ICD-10-CM

## 2011-09-24 LAB — CBC WITH DIFFERENTIAL/PLATELET
Basophils Absolute: 0 10*3/uL (ref 0.0–0.1)
Eosinophils Absolute: 0.2 10*3/uL (ref 0.0–0.5)
HGB: 10.4 g/dL — ABNORMAL LOW (ref 13.0–17.1)
MONO#: 0.4 10*3/uL (ref 0.1–0.9)
NEUT#: 2.8 10*3/uL (ref 1.5–6.5)
Platelets: 103 10*3/uL — ABNORMAL LOW (ref 140–400)
RBC: 2.92 10*6/uL — ABNORMAL LOW (ref 4.20–5.82)
RDW: 15.1 % — ABNORMAL HIGH (ref 11.0–14.6)
WBC: 4.2 10*3/uL (ref 4.0–10.3)

## 2011-09-24 NOTE — Progress Notes (Signed)
Pt's HGB today 10.4.  Pt is on Revlimid for multiple myeloma, also has unspecified deficiency anemia.  Per Merry Proud, pharmacist, pt's aranesp today should be held.  Dr. Dalene Carrow made aware and agrees.  Informed pt that he will not need injection today, but to call office if he develops shortness of breath, fatigue, weakness, dizziness before next appt.  Pt verbalizes understanding and is aware of next appt.

## 2011-10-04 ENCOUNTER — Other Ambulatory Visit: Payer: Self-pay | Admitting: *Deleted

## 2011-10-04 NOTE — Telephone Encounter (Signed)
THIS REQUEST FOR REVLIMID WAS PLACED IN DR.ODOGWU'S ACTIVE WORK INBOX.

## 2011-10-05 ENCOUNTER — Ambulatory Visit (HOSPITAL_BASED_OUTPATIENT_CLINIC_OR_DEPARTMENT_OTHER): Payer: Medicare Other | Admitting: Hematology and Oncology

## 2011-10-05 ENCOUNTER — Other Ambulatory Visit: Payer: Self-pay | Admitting: *Deleted

## 2011-10-05 ENCOUNTER — Other Ambulatory Visit (HOSPITAL_BASED_OUTPATIENT_CLINIC_OR_DEPARTMENT_OTHER): Payer: Medicare Other | Admitting: Lab

## 2011-10-05 ENCOUNTER — Telehealth: Payer: Self-pay | Admitting: Hematology and Oncology

## 2011-10-05 ENCOUNTER — Encounter: Payer: Self-pay | Admitting: Hematology and Oncology

## 2011-10-05 DIAGNOSIS — C9000 Multiple myeloma not having achieved remission: Secondary | ICD-10-CM

## 2011-10-05 DIAGNOSIS — D649 Anemia, unspecified: Secondary | ICD-10-CM

## 2011-10-05 DIAGNOSIS — C9002 Multiple myeloma in relapse: Secondary | ICD-10-CM

## 2011-10-05 LAB — CBC WITH DIFFERENTIAL/PLATELET
Basophils Absolute: 0 10*3/uL (ref 0.0–0.1)
Eosinophils Absolute: 0.3 10*3/uL (ref 0.0–0.5)
HCT: 30.4 % — ABNORMAL LOW (ref 38.4–49.9)
LYMPH%: 40.9 % (ref 14.0–49.0)
MCV: 103.1 fL — ABNORMAL HIGH (ref 79.3–98.0)
MONO#: 0.4 10*3/uL (ref 0.1–0.9)
NEUT#: 0.8 10*3/uL — ABNORMAL LOW (ref 1.5–6.5)
NEUT%: 31.1 % — ABNORMAL LOW (ref 39.0–75.0)
Platelets: 104 10*3/uL — ABNORMAL LOW (ref 140–400)
WBC: 2.5 10*3/uL — ABNORMAL LOW (ref 4.0–10.3)

## 2011-10-05 LAB — COMPREHENSIVE METABOLIC PANEL
BUN: 25 mg/dL — ABNORMAL HIGH (ref 6–23)
CO2: 23 mEq/L (ref 19–32)
Creatinine, Ser: 1.03 mg/dL (ref 0.50–1.35)
Glucose, Bld: 117 mg/dL — ABNORMAL HIGH (ref 70–99)
Total Bilirubin: 0.3 mg/dL (ref 0.3–1.2)
Total Protein: 6.1 g/dL (ref 6.0–8.3)

## 2011-10-05 MED ORDER — LENALIDOMIDE 15 MG PO CAPS: 15.0000 mg | ORAL_CAPSULE | Freq: Every day | ORAL | Status: DC

## 2011-10-05 NOTE — Progress Notes (Signed)
This office note has been dictated.

## 2011-10-05 NOTE — Telephone Encounter (Signed)
appts made and printed for pt aom °

## 2011-10-05 NOTE — Progress Notes (Signed)
CC:   [name], M.D. [name] Section, M.D.  IDENTIFYING STATEMENT:  The patient is a 78 year old man with an IgG kappa multiple myeloma who presents for followup.  INTERVAL HISTORY:  Mr. Cuervo began Revlimid with Decadron on August 19, 2011.  He has done well thus far.  He is tolerating Decadron weekly. He is also tolerating Aranesp every 3 weeks.  He has good weight.  He has good energy levels.  He denies mucositis.  MEDICATIONS:  Revlimid 25 mg daily for 3 weeks with a week off, Decadron 12 mg weekly.  The rest of the medicines reviewed and updated.  ALLERGIES:  Sulfa.  PAST MEDICAL HISTORY/FAMILY HISTORY/SOCIAL HISTORY:  Unchanged.  REVIEW OF SYSTEMS:  Ten point review of systems negative.  PHYSICAL EXAM:  The patient is a well-appearing, well-nourished man in no distress.  Vitals:  Pulse 78, blood pressure 162/74, temperature 97, respirations 20, weight 192 pounds.  HEENT:  Head is atraumatic, normocephalic.  Sclerae anicteric.  Mouth moist.  Chest:  Clear. Abdomen:  Soft, nontender.  Extremities:  No edema.  No calf tenderness. CNS:  Nonfocal.  LAB DATA:  10/05/2011 white cell count 2.5, hemoglobin 9.9, hematocrit 30.4, platelets 104, ANC 800.  CMET pending.  IMPRESSION AND PLAN:  Mr. Sciandra is a 78 year old man with IgG kappa multiple myeloma with bone marrow biopsy initially revealing 48% plasma cells when initially diagnosed on 10/21/2009.  Status post 9 months of melphalan, prednisone and subcutaneous Velcade from April 2011 through March 2012 with near complete response to therapy.  Following progression without renal involvement or hypercalcemia, he was initiated on Revlimid and weekly Decadron on August 19, 2011.  He completes his 2nd cycle today, but he has mild neutropenia.  He is afebrile.  I have asked him to hold today's dose.  He has a week off.  He returns in a week's time prior to cycle 3.  Revlimid will be dose reduced to 15 mg daily for 2 weeks  followed by a week off thereafter.  He follows up then.    ______________________________ Laurice Record, M.D. LIO/MEDQ  D:  10/05/2011  T:  10/05/2011  Job:  [id number]

## 2011-10-05 NOTE — Patient Instructions (Signed)
Patient to follow up as instructed.   Current Outpatient Prescriptions  Medication Sig Dispense Refill  . amLODipine (NORVASC) 5 MG tablet Take 5 mg by mouth daily.        Marland Kitchen aspirin 325 MG EC tablet Take 325 mg by mouth daily.        . cetirizine-pseudoephedrine (ZYRTEC-D) 5-120 MG per tablet Take 1 tablet by mouth daily.       . clopidogrel (PLAVIX) 75 MG tablet Take 75 mg by mouth daily.        Marland Kitchen dexamethasone (DECADRON) 4 MG tablet Take 3 tablets (12 mg total) by mouth once a week. Take  3  Tablets  Po  Weekly  As directed.  100 tablet  0  . flunisolide (NASAREL) 29 MCG/ACT (0.025%) nasal spray Place 2 sprays into the nose at bedtime. Dose is for each nostril.       . Glucosamine-Chondroit-Vit C-Mn (GLUCOSAMINE 1500 COMPLEX) CAPS Take 2 capsules by mouth daily.      Marland Kitchen lenalidomide (REVLIMID) 25 MG capsule Take 1 capsule (25 mg total) by mouth daily. Take 1 tab po daily for  3  Weeks  Followed by  1  Week off. Gave rx to [place], [place]  08/06/11. Authorization  #    [id number]   09/01/11.  21 capsule  0  . metoprolol (TOPROL-XL) 50 MG 24 hr tablet Take 50 mg by mouth daily.        . Multiple Vitamins-Minerals (MULTIVITAMINS THER. W/MINERALS) TABS Take 1 tablet by mouth daily.        Marland Kitchen omeprazole (PRILOSEC) 20 MG capsule Take 20 mg by mouth daily.        . simvastatin (ZOCOR) 20 MG tablet Take 20 mg by mouth at bedtime.        . valsartan (DIOVAN) 320 MG tablet Take 320 mg by mouth daily.        . Vitamin D, Ergocalciferol, (DRISDOL) 50000 UNITS CAPS Take 50,000 Units by mouth every 14 (fourteen) days.       . nitroGLYCERIN (NITROSTAT) 0.4 MG SL tablet Place 0.4 mg under the tongue every 5 (five) minutes as needed. For chest pain       . oxyCODONE (OXY IR/ROXICODONE) 5 MG immediate release tablet 1-2 po q 4-6 hrs prn pain  60 tablet  0  . prochlorperazine (COMPAZINE) 10 MG tablet Take 10 mg by mouth every 8 (eight) hours as needed. Gave original rx to [place], [place]  08/06/11. # 30 ;  No refill.       . traMADol (ULTRAM) 50 MG tablet Take 50 mg by mouth every 6 (six) hours as needed.            March 2013  Sunday Monday Tuesday Wednesday Thursday Friday Saturday                  1   2    3   4   5   6   7   8    LAB MO     9:00 AM  (15 min.)  Mauri Brooklyn  [facility] MED[facility]COL[facility]   INJECTION   9:30 AM  (15 min.)  [facility]  [facility] MEDICAL ONCOLOG[facility] 9    10   11   12   13   14   15   16    17   18   19    LAB MO  11:00 AM  (15 min.)  Krista Blue  [facility] MEDICAL ONCOLOGY   EST PT 30  11:30 AM  (30 min.)  Laurice Record, MD  [facility] MEDICAL ONCOLOGY 20   21   22   23    24   25   26   27   28   29   30     31

## 2011-10-12 ENCOUNTER — Telehealth: Payer: Self-pay | Admitting: Nurse Practitioner

## 2011-10-12 ENCOUNTER — Other Ambulatory Visit (HOSPITAL_BASED_OUTPATIENT_CLINIC_OR_DEPARTMENT_OTHER): Payer: Medicare Other | Admitting: Lab

## 2011-10-12 ENCOUNTER — Other Ambulatory Visit: Payer: Self-pay | Admitting: Nurse Practitioner

## 2011-10-12 DIAGNOSIS — C9000 Multiple myeloma not having achieved remission: Secondary | ICD-10-CM

## 2011-10-12 LAB — CBC WITH DIFFERENTIAL/PLATELET
Basophils Absolute: 0 10*3/uL (ref 0.0–0.1)
Eosinophils Absolute: 0.1 10*3/uL (ref 0.0–0.5)
HCT: 29.8 % — ABNORMAL LOW (ref 38.4–49.9)
HGB: 10.2 g/dL — ABNORMAL LOW (ref 13.0–17.1)
LYMPH%: 40 % (ref 14.0–49.0)
MONO#: 0.5 10*3/uL (ref 0.1–0.9)
NEUT#: 1.3 10*3/uL — ABNORMAL LOW (ref 1.5–6.5)
Platelets: 157 10*3/uL (ref 140–400)
RBC: 2.87 10*6/uL — ABNORMAL LOW (ref 4.20–5.82)
WBC: 3.1 10*3/uL — ABNORMAL LOW (ref 4.0–10.3)

## 2011-10-12 NOTE — Telephone Encounter (Signed)
Spoke with patient.  Informed WBC and ANC were better per Dr. Lonell Face review of CBC.  Instructed to begin Revlimid 15mg  daily x 2 weeks; Decadron 12mg  weekly, followed by 1 week rest and f/u and lab on 4/16.  Pt verbalized understanding.

## 2011-11-01 ENCOUNTER — Other Ambulatory Visit: Payer: Self-pay | Admitting: *Deleted

## 2011-11-01 NOTE — Telephone Encounter (Signed)
THIS REQUEST FOR REVLIMID WAS PLACED IN DR.ODOGWU'S ACTIVE WORK BOX.

## 2011-11-02 ENCOUNTER — Encounter: Payer: Self-pay | Admitting: Hematology and Oncology

## 2011-11-02 ENCOUNTER — Other Ambulatory Visit (HOSPITAL_BASED_OUTPATIENT_CLINIC_OR_DEPARTMENT_OTHER): Payer: Medicare Other | Admitting: Lab

## 2011-11-02 ENCOUNTER — Telehealth: Payer: Self-pay | Admitting: Hematology and Oncology

## 2011-11-02 ENCOUNTER — Ambulatory Visit (HOSPITAL_BASED_OUTPATIENT_CLINIC_OR_DEPARTMENT_OTHER): Payer: Medicare Other | Admitting: Hematology and Oncology

## 2011-11-02 ENCOUNTER — Other Ambulatory Visit: Payer: Self-pay | Admitting: *Deleted

## 2011-11-02 DIAGNOSIS — C9000 Multiple myeloma not having achieved remission: Secondary | ICD-10-CM

## 2011-11-02 LAB — CBC WITH DIFFERENTIAL/PLATELET
Basophils Absolute: 0 10*3/uL (ref 0.0–0.1)
EOS%: 2.9 % (ref 0.0–7.0)
Eosinophils Absolute: 0.1 10*3/uL (ref 0.0–0.5)
HGB: 10.5 g/dL — ABNORMAL LOW (ref 13.0–17.1)
NEUT#: 1.5 10*3/uL (ref 1.5–6.5)
RBC: 3.07 10*6/uL — ABNORMAL LOW (ref 4.20–5.82)
RDW: 14.7 % — ABNORMAL HIGH (ref 11.0–14.6)
lymph#: 1.2 10*3/uL (ref 0.9–3.3)

## 2011-11-02 LAB — COMPREHENSIVE METABOLIC PANEL
AST: 13 U/L (ref 0–37)
Albumin: 3.4 g/dL — ABNORMAL LOW (ref 3.5–5.2)
BUN: 19 mg/dL (ref 6–23)
Calcium: 8.4 mg/dL (ref 8.4–10.5)
Chloride: 108 mEq/L (ref 96–112)
Potassium: 4.1 mEq/L (ref 3.5–5.3)
Sodium: 139 mEq/L (ref 135–145)
Total Protein: 6.2 g/dL (ref 6.0–8.3)

## 2011-11-02 MED ORDER — LENALIDOMIDE 15 MG PO CAPS: 15.0000 mg | ORAL_CAPSULE | Freq: Every day | ORAL | Status: DC

## 2011-11-02 NOTE — Patient Instructions (Signed)
Patient to follow up as instructed.   Current Outpatient Prescriptions  Medication Sig Dispense Refill  . amLODipine (NORVASC) 5 MG tablet Take 5 mg by mouth daily.        Marland Kitchen aspirin 325 MG EC tablet Take 325 mg by mouth daily.        . cetirizine-pseudoephedrine (ZYRTEC-D) 5-120 MG per tablet Take 1 tablet by mouth daily.       . clopidogrel (PLAVIX) 75 MG tablet Take 75 mg by mouth daily.        . flunisolide (NASAREL) 29 MCG/ACT (0.025%) nasal spray Place 2 sprays into the nose at bedtime. Dose is for each nostril.       . Glucosamine-Chondroit-Vit C-Mn (GLUCOSAMINE 1500 COMPLEX) CAPS Take 2 capsules by mouth daily.      . metoprolol (TOPROL-XL) 50 MG 24 hr tablet Take 50 mg by mouth daily.        . Multiple Vitamins-Minerals (MULTIVITAMINS THER. W/MINERALS) TABS Take 1 tablet by mouth daily.        . nitroGLYCERIN (NITROSTAT) 0.4 MG SL tablet Place 0.4 mg under the tongue every 5 (five) minutes as needed. For chest pain       . omeprazole (PRILOSEC) 20 MG capsule Take 20 mg by mouth daily.        . prochlorperazine (COMPAZINE) 10 MG tablet Take 10 mg by mouth every 8 (eight) hours as needed. Gave original rx to [place], [place]  08/06/11. # 30 ;  No refill.      . simvastatin (ZOCOR) 20 MG tablet Take 20 mg by mouth at bedtime.        . traMADol (ULTRAM) 50 MG tablet Take 50 mg by mouth every 6 (six) hours as needed.      . valsartan (DIOVAN) 320 MG tablet Take 320 mg by mouth daily.        . Vitamin D, Ergocalciferol, (DRISDOL) 50000 UNITS CAPS Take 50,000 Units by mouth every 14 (fourteen) days.       Marland Kitchen dexamethasone (DECADRON) 4 MG tablet Take 3 tablets (12 mg total) by mouth once a week. Take  3  Tablets  Po  Weekly  As directed.  100 tablet  0  . lenalidomide (REVLIMID) 15 MG capsule Take 1 capsule (15 mg total) by mouth daily. Take 15 mg po daily x  3  Weeks  Followed  By  1  Week off.  21 capsule  0        April 2013  Sunday Monday Tuesday Wednesday Thursday Friday Saturday      1   2   3   4   5   6    7   8   9   10   11   12   13    14   15   16    LAB MO    10:00 AM  (15 min.)  Sherrie Mustache  [facility] MEDICAL ONCOLOGY   EST PT 30  10:30 AM  (30 min.)  Laurice Record, MD  [facility] MEDICAL ONCOLOGY 17   18   19   20    21   22   23   24   25   26   27    28   29   16 Dec 2011  Sunday Monday Tuesday Wednesday Thursday Friday Saturday            1   2   3   4    5   6   7   8   9   10   11    12   13   14   15   16   17   18    19   20   21   22   23   24   25    26   27   28   29   30  Happy [place] Ouch!   31

## 2011-11-02 NOTE — Telephone Encounter (Signed)
Gave pt calendar appt for 11/23/11 md and labs, asked pt if Aranesp injection should be schduled , he said no, Md will notify him if he needs it.

## 2011-11-02 NOTE — Progress Notes (Signed)
This office note has been dictated.

## 2011-11-02 NOTE — Progress Notes (Signed)
CC:   John Carter, M.D. [name] Section, M.D.  IDENTIFYING STATEMENT:  The patient is a 78 year old man with multiple myeloma who presents for followup.  INTERVAL HISTORY:  John Carter continues with Revlimid and weekly Decadron.  He notes acid reflux since beginning steroids.  He has a prescription for Zantac which he is not taking.  Otherwise he has good energy levels.  He denies nausea or vomiting.  He denies mucositis.  He notes loose stools which averages once a day.  Denies rectal bleeding.  MEDICATIONS:  Revlimid 50 mg daily for 2 weeks followed by a week off. Decadron 12 mg weekly.  Rest of medicines reviewed and updated.  ALLERGIES:  Sulfa.  PAST MEDICAL HISTORY/FAMILY HISTORY/SOCIAL HISTORY:  Unchanged.  REVIEW OF SYSTEMS:  10 point review of systems negative.  PHYSICAL EXAM:  Patient is alert and oriented x3.  Vitals:  Pulse 73, blood pressure 155/69, temperature 97.3, respirations 20.  Weight 194.1 pounds.  HEENT:  Head is atraumatic, normocephalic.  Sclerae anicteric. Mouth moist.  Neck:  Supple.  Chest:  Clear.  Cardiovascular: Unremarkable.  Abdomen:  Soft, nontender.  Bowel sounds present. Extremities:  No edema.  LAB DATA:  11/02/2011 white cell count 2.5, hemoglobin 10.5, hematocrit 31.3, platelets 136.  CMET pending.  IMPRESSION AND PLAN:  John Carter is a 78 year old man with: 1. IgG kappa multiple myeloma status post bone marrow biopsy revealing     48% plasma cells diagnosed on 10/21/2009. 2. Status post 9 months of melphalan, prednisone and subcu Velcade     from April 2011 through March 2012 with near complete response to     therapy. 3. Noted to have biochemical progression without renal involvement or     hypocalcemia and was initiated with Revlimid and weekly Decadron on     August 19, 2011.  Required dose reduction with cycle 3 with     Revlimid dosed at 15 mg with 2 and 1 off and Decadron 12 mg weekly.     His labs are adequate with the  current dose and schedule.  He will     thus continue with no changes.  He begins cycle 4 on November 03, 2011.  He follows up prior to cycle 5 on May 7th with blood work.    ______________________________ John Carter, M.D. LIO/MEDQ  D:  11/02/2011  T:  11/02/2011  Job:  [id number]

## 2011-11-19 IMAGING — CR DG BONE SURVEY MET
9 of 10 series · 9 of 10 positions shown · non-contrast
Comparison: 09/05/2009.

CLINICAL DATA: History of multiple myeloma.  Bilateral knee pain.
History of bilateral knee arthritis.  Low back pain.

METASTATIC BONE SURVEY

[w c-spine lat]
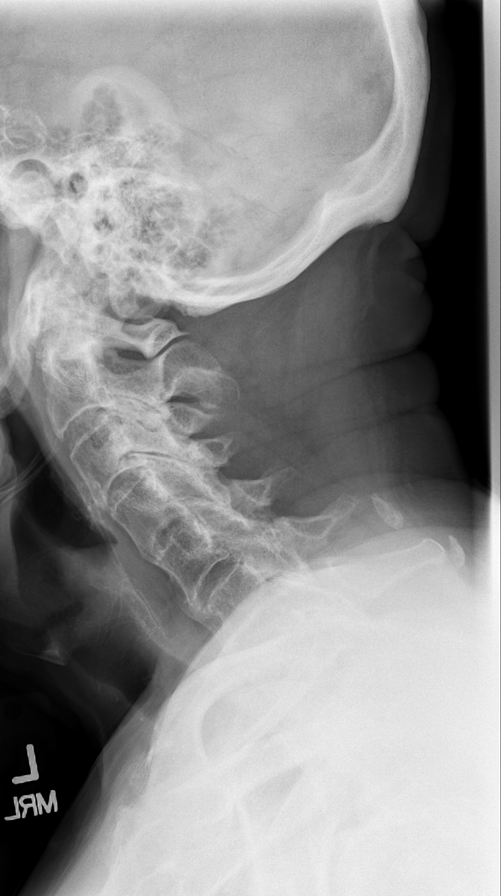

[w swimmers view *]
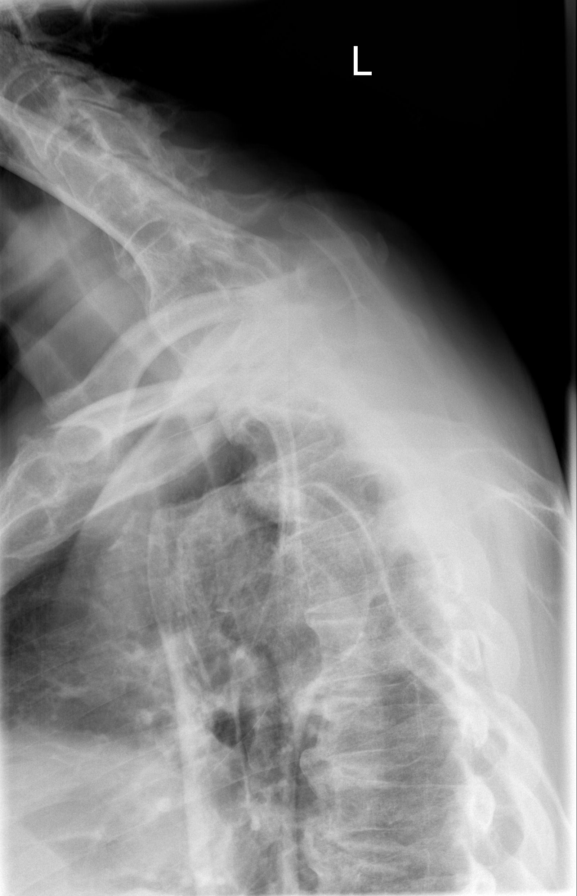

[w skull lat *]
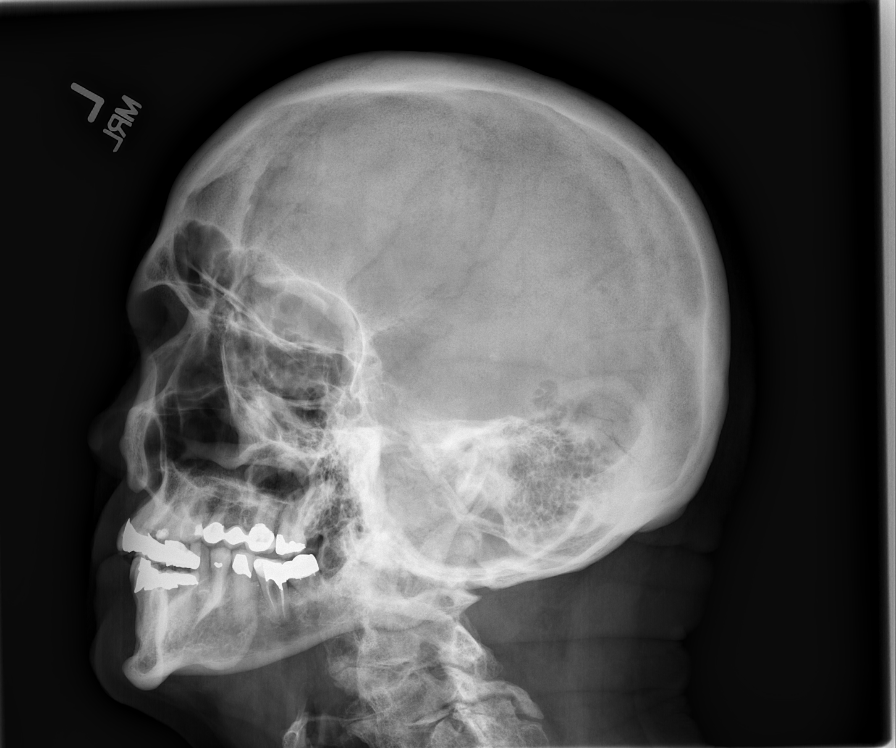

[t c-spine a.p.]
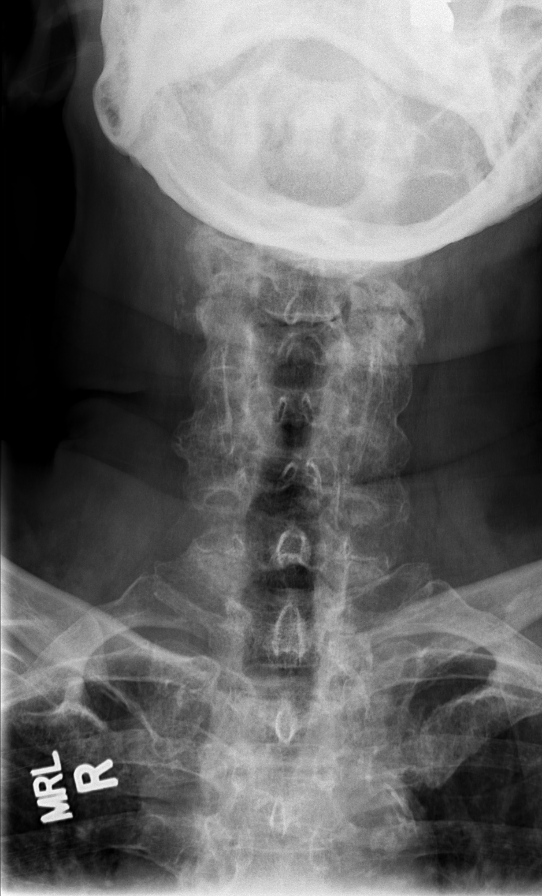

[t shoulder ap external righ]
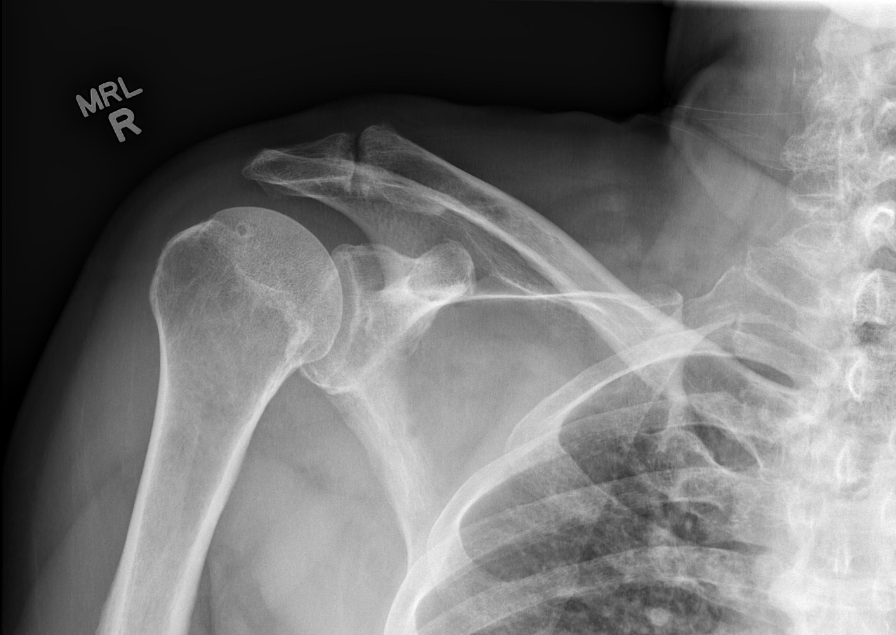

[t humerus ap right]
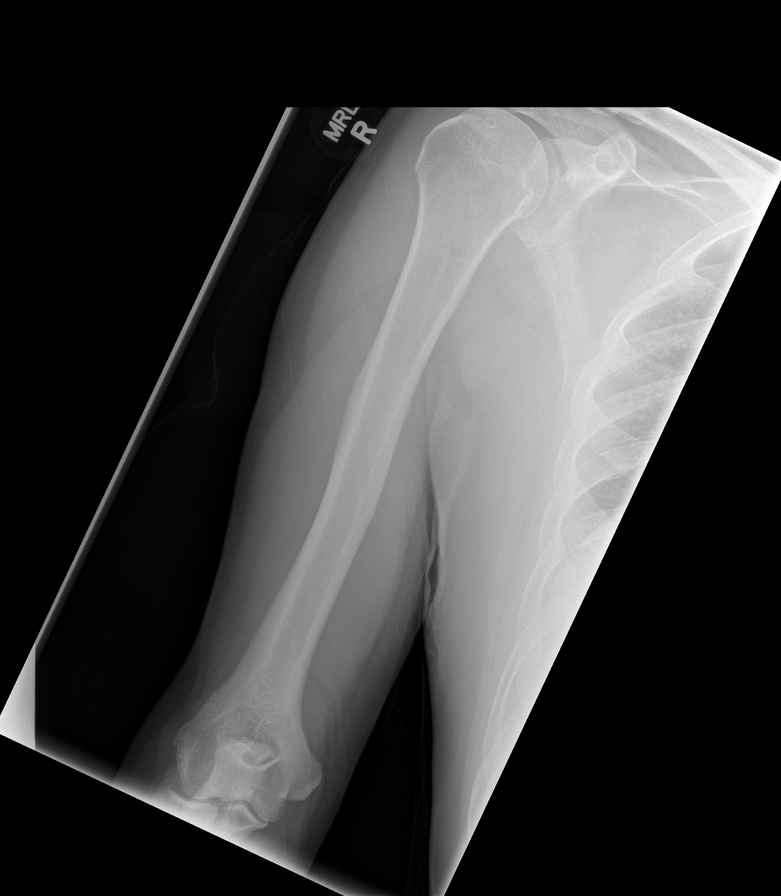

[t shoulder ap external left]
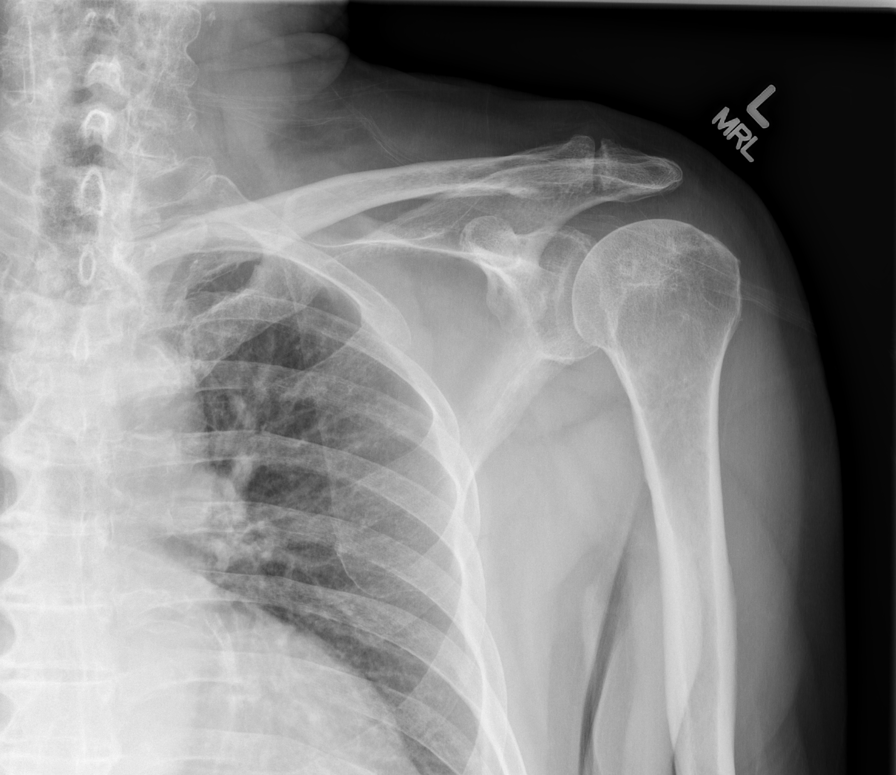

[t humerus ap left]
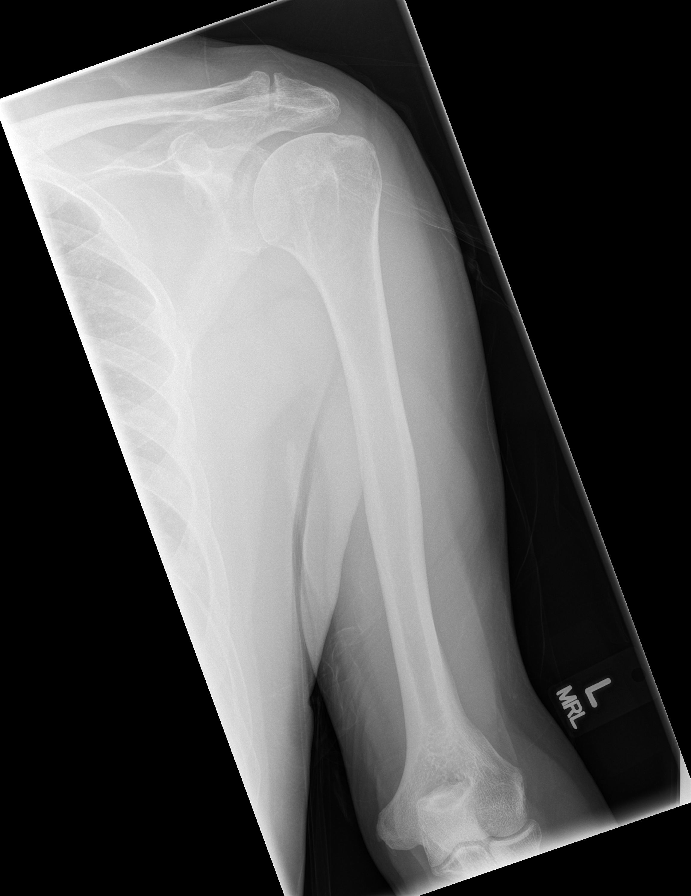

[t t-spine a.p.]
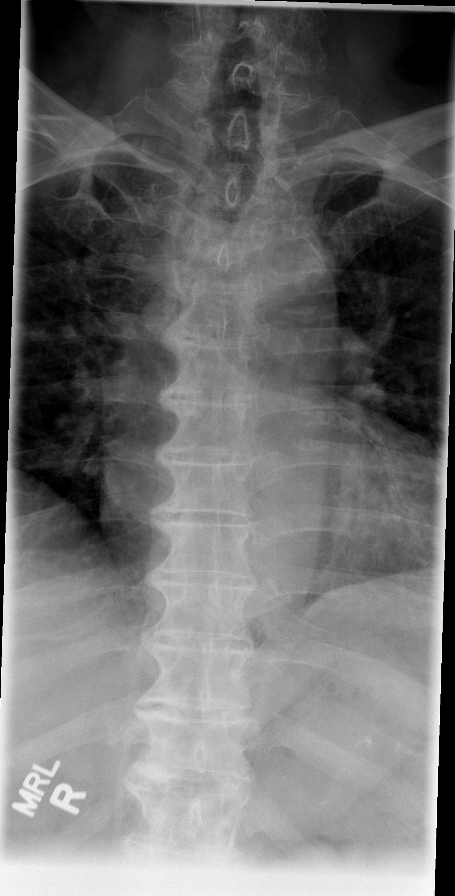

[9 of 10 positions shown; findings below may reference images not displayed]

FINDINGS: Lateral skull:  The previously seen posterior skull lucency is not
seen on the current lateral skull view due to obliquity.  This is
seen on the lateral view of the cervical spine and is unchanged.

AP and lateral cervical spine:  Multilevel degenerative changes and
ankylosis.  No lytic lesions.  Bilateral carotid artery
atheromatous calcifications.

AP and lateral thoracic spine:  Multilevel degenerative changes,
including changes of DISH.  No lytic lesions.

AP and lateral lumbar spine:  Mild dextroconvex scoliosis.
Degenerative spur formation and facet degenerative changes with
bony ankylosis.  Stable 30% L1 superior endplate compression
deformity without visible fracture lines.

AP pelvis:  Bilateral hip degenerative changes.  No lytic lesions.

AP femurs:  No lytic lesions. Bilateral knee degenerative changes
and atheromatous arterial calcifications.

AP humeri:  No lytic lesions.

AP shoulders:  No lytic lesions.  Mild bilateral degenerative
changes.
IMPRESSION: 1.  No lytic lesions suspicious for multiple myeloma.
2.  Multi site degenerative changes, as described above.
3.  Atheromatous arterial calcifications, including the carotid
arteries bilaterally.

## 2011-11-23 ENCOUNTER — Encounter: Payer: Self-pay | Admitting: Hematology and Oncology

## 2011-11-23 ENCOUNTER — Telehealth: Payer: Self-pay | Admitting: *Deleted

## 2011-11-23 ENCOUNTER — Ambulatory Visit (HOSPITAL_BASED_OUTPATIENT_CLINIC_OR_DEPARTMENT_OTHER): Payer: Medicare Other | Admitting: Hematology and Oncology

## 2011-11-23 ENCOUNTER — Telehealth: Payer: Self-pay | Admitting: Hematology and Oncology

## 2011-11-23 ENCOUNTER — Other Ambulatory Visit: Payer: Medicare Other

## 2011-11-23 DIAGNOSIS — C9000 Multiple myeloma not having achieved remission: Secondary | ICD-10-CM

## 2011-11-23 LAB — CBC WITH DIFFERENTIAL/PLATELET
BASO%: 0.6 % (ref 0.0–2.0)
Basophils Absolute: 0 10*3/uL (ref 0.0–0.1)
EOS%: 6.7 % (ref 0.0–7.0)
HGB: 10.3 g/dL — ABNORMAL LOW (ref 13.0–17.1)
MCH: 34.4 pg — ABNORMAL HIGH (ref 27.2–33.4)
MCHC: 33.4 g/dL (ref 32.0–36.0)
MONO%: 15.1 % — ABNORMAL HIGH (ref 0.0–14.0)
RBC: 3 10*6/uL — ABNORMAL LOW (ref 4.20–5.82)
RDW: 15.3 % — ABNORMAL HIGH (ref 11.0–14.6)
lymph#: 0.9 10*3/uL (ref 0.9–3.3)
nRBC: 0 % (ref 0–0)

## 2011-11-23 LAB — COMPREHENSIVE METABOLIC PANEL
ALT: 13 U/L (ref 0–53)
AST: 14 U/L (ref 0–37)
Albumin: 3.4 g/dL — ABNORMAL LOW (ref 3.5–5.2)
Alkaline Phosphatase: 86 U/L (ref 39–117)
Potassium: 3.9 mEq/L (ref 3.5–5.3)
Sodium: 140 mEq/L (ref 135–145)
Total Bilirubin: 0.3 mg/dL (ref 0.3–1.2)
Total Protein: 6 g/dL (ref 6.0–8.3)

## 2011-11-23 NOTE — Telephone Encounter (Signed)
Called pt at home and left message on voice mail re:  Glucose level  202  Today  As per md's instructions.

## 2011-11-23 NOTE — Telephone Encounter (Signed)
Gv pt appt for 05/28

## 2011-11-23 NOTE — Patient Instructions (Signed)
John Carter  [id number]  [facility] Discharge Instructions  RECOMMENDATIONS MADE BY THE CONSULTANT AND ANY TEST RESULTS WILL BE SENT TO YOUR REFERRING DOCTOR.   EXAM FINDINGS BY MD TODAY AND SIGNS AND SYMPTOMS TO REPORT TO CLINIC OR PRIMARY MD:   Your current list of medications are: Current Outpatient Prescriptions  Medication Sig Dispense Refill  . amLODipine (NORVASC) 5 MG tablet Take 5 mg by mouth daily.        Marland Kitchen aspirin 325 MG EC tablet Take 325 mg by mouth daily.        . cetirizine-pseudoephedrine (ZYRTEC-D) 5-120 MG per tablet Take 1 tablet by mouth daily.       . clopidogrel (PLAVIX) 75 MG tablet Take 75 mg by mouth daily.        . flunisolide (NASAREL) 29 MCG/ACT (0.025%) nasal spray Place 2 sprays into the nose at bedtime. Dose is for each nostril.       . Glucosamine-Chondroit-Vit C-Mn (GLUCOSAMINE 1500 COMPLEX) CAPS Take 2 capsules by mouth daily.      . metoprolol (TOPROL-XL) 50 MG 24 hr tablet Take 50 mg by mouth daily.        . Multiple Vitamins-Minerals (MULTIVITAMINS THER. W/MINERALS) TABS Take 1 tablet by mouth daily.        Marland Kitchen omeprazole (PRILOSEC) 20 MG capsule Take 20 mg by mouth daily.        . simvastatin (ZOCOR) 20 MG tablet Take 20 mg by mouth at bedtime.        . traMADol (ULTRAM) 50 MG tablet Take 50 mg by mouth every 6 (six) hours as needed.      . valsartan (DIOVAN) 320 MG tablet Take 320 mg by mouth daily.        . Vitamin D, Ergocalciferol, (DRISDOL) 50000 UNITS CAPS Take 50,000 Units by mouth every 14 (fourteen) days.       Marland Kitchen dexamethasone (DECADRON) 4 MG tablet Take 3 tablets (12 mg total) by mouth once a week. Take  3  Tablets  Po  Weekly  As directed.  100 tablet  0  . lenalidomide (REVLIMID) 15 MG capsule Take 1 capsule (15 mg total) by mouth daily. Take 15 mg po daily x  2  Weeks,   Then  Off   1  Week.  21 capsule  0  . nitroGLYCERIN (NITROSTAT) 0.4 MG SL tablet Place 0.4 mg under the tongue every 5 (five) minutes as needed. For  chest pain       . prochlorperazine (COMPAZINE) 10 MG tablet Take 10 mg by mouth every 8 (eight) hours as needed. Gave original rx to [place], [place]  08/06/11. # 30 ;  No refill.         INSTRUCTIONS GIVEN AND DISCUSSED:   SPECIAL INSTRUCTIONS/FOLLOW-UP:  See above.  I acknowledge that I have been informed and understand all the instructions given to me and received a copy. I do not have any more questions at this time, but understand that I may call the [facility] at [phone] during business hours should I have any further questions or need assistance in obtaining follow-up care.

## 2011-11-23 NOTE — Progress Notes (Signed)
This office note has been dictated.

## 2011-11-23 NOTE — Progress Notes (Signed)
CC:   John Carter, M.D. John Carter Section, M.D.  IDENTIFYING STATEMENT:  The patient is a 78 year old man with multiple myeloma who presents for followup.  INTERVAL HISTORY:  John Carter continues with Revlimid and weekly Decadron.  Blood sugars are well managed.  He has good energy levels. He has very minimal nausea or vomiting.  He has very minimal diarrhea and takes Imodium infrequently.  He has not lost any weight.  He denies bone pain.  MEDICATIONS:  Revlimid 15 mg daily for 2 weeks followed by a week off, Decadron 12 mg weekly.  Rest of medicines reviewed and updated.  PAST MEDICAL HISTORY/FAMILY HISTORY/SOCIAL HISTORY:  Unchanged.  REVIEW OF SYSTEMS:  Ten point review of systems negative.  PHYSICAL EXAM:  The patient is a well-appearing, well-nourished man in no distress.  Vitals:  Pulse 79, blood pressure 165/71, temperature 97.2, respirations 20, weight 194.3 pounds.  HEENT:  Head is atraumatic, normocephalic.  Sclerae anicteric.  Mouth moist.  Chest:  Clear. Abdomen:  Soft, nontender.  Bowel sounds present.  Extremities:  No calf tenderness.  Lymph nodes:  No adenopathy.  LAB DATA:  11/23/2011 white cell count 4.3, hemoglobin 10.3, hematocrit 30.9, platelets 118.  CMET pending.  IMPRESSION/PLAN:  John Carter is a 78 year old man with: 1. IgG kappa multiple myeloma status post bone marrow biopsy revealing     48% plasma cells  diagnosed on 10/21/2009. 2. Status post 9 months of melphalan, prednisone and subcutaneous     Velcade from April 2011 through March 2012 with near complete     response to therapy. 3. Following biochemical progression without renal involvement or     hypercalcemia, he was initiated on Revlimid with weekly Decadron on     August 19, 2011.  He required dose reduction with cycle 3.  Revlimid     now dosed at 15 mg with a 2 on 1 off schedule and Decadron 12 mg     Weekly.  John Carter labs are adequate for him to proceed with cycle 5  on 11/24/2011.  He follows up prior to cycle 6 in 3 weeks time.    ______________________________ Laurice Record, M.D. LIO/MEDQ  D:  11/23/2011  T:  11/23/2011  Job:  [id number]

## 2011-11-26 ENCOUNTER — Telehealth: Payer: Self-pay | Admitting: Nurse Practitioner

## 2011-11-26 NOTE — Telephone Encounter (Signed)
Pt called- stated his primary MD prescribed glimepiride for elevated blood sugar.  Pt inquired, "should I take this medicine because my blood sugar is normal now?".  RN advised that patient should call prescribing physician for advice related to the glimepiride. Pt verbalized understanding.

## 2011-12-07 ENCOUNTER — Other Ambulatory Visit: Payer: Self-pay | Admitting: *Deleted

## 2011-12-07 MED ORDER — LENALIDOMIDE 15 MG PO CAPS: 15.0000 mg | ORAL_CAPSULE | Freq: Every day | ORAL | Status: DC

## 2011-12-14 ENCOUNTER — Ambulatory Visit (HOSPITAL_BASED_OUTPATIENT_CLINIC_OR_DEPARTMENT_OTHER): Payer: Medicare Other | Admitting: Hematology and Oncology

## 2011-12-14 ENCOUNTER — Other Ambulatory Visit (HOSPITAL_BASED_OUTPATIENT_CLINIC_OR_DEPARTMENT_OTHER): Payer: Medicare Other | Admitting: Lab

## 2011-12-14 ENCOUNTER — Telehealth: Payer: Self-pay | Admitting: Hematology and Oncology

## 2011-12-14 ENCOUNTER — Encounter: Payer: Self-pay | Admitting: Hematology and Oncology

## 2011-12-14 VITALS — BP 163/66 | HR 77 | Temp 97.4°F | Ht 68.0 in | Wt 194.1 lb

## 2011-12-14 DIAGNOSIS — C9000 Multiple myeloma not having achieved remission: Secondary | ICD-10-CM

## 2011-12-14 LAB — CBC WITH DIFFERENTIAL/PLATELET
BASO%: 1.1 % (ref 0.0–2.0)
HCT: 32.4 % — ABNORMAL LOW (ref 38.4–49.9)
LYMPH%: 25.8 % (ref 14.0–49.0)
MCHC: 33.1 g/dL (ref 32.0–36.0)
MCV: 102.2 fL — ABNORMAL HIGH (ref 79.3–98.0)
MONO#: 0.7 10*3/uL (ref 0.1–0.9)
MONO%: 19.7 % — ABNORMAL HIGH (ref 0.0–14.0)
NEUT%: 47.2 % (ref 39.0–75.0)
Platelets: 127 10*3/uL — ABNORMAL LOW (ref 140–400)
WBC: 3.8 10*3/uL — ABNORMAL LOW (ref 4.0–10.3)

## 2011-12-14 LAB — COMPREHENSIVE METABOLIC PANEL
AST: 15 U/L (ref 0–37)
BUN: 23 mg/dL (ref 6–23)
CO2: 26 mEq/L (ref 19–32)
Calcium: 8.5 mg/dL (ref 8.4–10.5)
Chloride: 109 mEq/L (ref 96–112)
Creatinine, Ser: 0.9 mg/dL (ref 0.50–1.35)

## 2011-12-14 NOTE — Patient Instructions (Signed)
John Carter  [id number]  [facility] Discharge Instructions  RECOMMENDATIONS MADE BY THE CONSULTANT AND ANY TEST RESULTS WILL BE SENT TO YOUR REFERRING DOCTOR.   EXAM FINDINGS BY MD TODAY AND SIGNS AND SYMPTOMS TO REPORT TO CLINIC OR PRIMARY MD:   Your current list of medications are: Current Outpatient Prescriptions  Medication Sig Dispense Refill  . amLODipine (NORVASC) 5 MG tablet Take 5 mg by mouth daily.        Marland Kitchen aspirin 325 MG EC tablet Take 325 mg by mouth daily.        . cetirizine-pseudoephedrine (ZYRTEC-D) 5-120 MG per tablet Take 1 tablet by mouth daily.       . clopidogrel (PLAVIX) 75 MG tablet Take 75 mg by mouth daily.        . flunisolide (NASAREL) 29 MCG/ACT (0.025%) nasal spray Place 2 sprays into the nose at bedtime. Dose is for each nostril.       Marland Kitchen glimepiride (AMARYL) 1 MG tablet Take 1 mg by mouth daily before breakfast.      . Glucosamine-Chondroit-Vit C-Mn (GLUCOSAMINE 1500 COMPLEX) CAPS Take 2 capsules by mouth daily.      . metoprolol (TOPROL-XL) 50 MG 24 hr tablet Take 50 mg by mouth daily.        . Multiple Vitamins-Minerals (MULTIVITAMINS THER. W/MINERALS) TABS Take 1 tablet by mouth daily.        Marland Kitchen omeprazole (PRILOSEC) 20 MG capsule Take 20 mg by mouth daily.        . simvastatin (ZOCOR) 20 MG tablet Take 20 mg by mouth at bedtime.        . valsartan (DIOVAN) 320 MG tablet Take 320 mg by mouth daily.        . Vitamin D, Ergocalciferol, (DRISDOL) 50000 UNITS CAPS Take 50,000 Units by mouth every 14 (fourteen) days.       Marland Kitchen dexamethasone (DECADRON) 4 MG tablet Take 3 tablets (12 mg total) by mouth once a week. Take  3  Tablets  Po  Weekly  As directed.  100 tablet  0  . lenalidomide (REVLIMID) 15 MG capsule Take 1 capsule (15 mg total) by mouth daily. Take 15 mg po daily x  2  Weeks,   Then  Off   1  Week.  14 capsule  0  . nitroGLYCERIN (NITROSTAT) 0.4 MG SL tablet Place 0.4 mg under the tongue every 5 (five) minutes as needed. For chest  pain       . prochlorperazine (COMPAZINE) 10 MG tablet Take 10 mg by mouth every 8 (eight) hours as needed. Gave original rx to [place], [place]  08/06/11. # 30 ;  No refill.         INSTRUCTIONS GIVEN AND DISCUSSED:   SPECIAL INSTRUCTIONS/FOLLOW-UP:  See above.  I acknowledge that I have been informed and understand all the instructions given to me and received a copy. I do not have any more questions at this time, but understand that I may call the [facility] at [phone] during business hours should I have any further questions or need assistance in obtaining follow-up care.

## 2011-12-14 NOTE — Telephone Encounter (Signed)
Gave pt appt for June 2013 lab, skeletal survey then MD after a few days

## 2011-12-14 NOTE — Progress Notes (Signed)
This office note has been dictated.

## 2011-12-14 NOTE — Progress Notes (Signed)
CC:   John Carter, M.D. John Carter Section, M.D.  IDENTIFYING STATEMENT:  The patient is a 78 year old man with multiple myeloma who presents for followup.  INTERVAL HISTORY:  John Carter presents prior to cycle 6, which he begins in the morning.  He is on glimepiride for hyperglycemia secondary to steroids.  His levels appear well controlled with medication.  He is afebrile.  He denies nausea, vomiting, mucositis, or diarrhea.  His energy levels are fair.  His weight is stable.  He denies pain.  MEDICATIONS:  Revlimid 15 mg daily for 2 weeks, followed by a week's rest.  Decadron 12 mg weekly.  Rest of medicines reviewed and updated.  PAST MEDICAL HISTORY/FAMILY HISTORY/SOCIAL HISTORY:  Unchanged.  REVIEW OF SYSTEMS:  Ten-point review of systems negative.  PHYSICAL EXAMINATION:  General:  The patient is a well-appearing, well- nourished man in no distress.  Vitals:  Pulse 77, blood pressure 163/66, temperature 97, respirations 20, weight 194.1 pounds.  HEENT:  Head is atraumatic, normocephalic.  Sclerae anicteric.  Mouth moist.  Neck: Supple.  Chest:  Clear to percussion and auscultation.  CVS:  First and second heart sounds present.  No added sounds or murmurs.  Abdomen: Soft, nontender.  Bowel sounds present.  Extremities:  No edema.  LABORATORY DATA:  12/14/2011:  White cell count 3.8, hemoglobin 10.7, hematocrit 32.4, platelets 127 (118).  CMET pending.  IMPRESSION AND PLAN:  John Carter is a 78 year old man with: 1. IgG kappa multiple myeloma, status post bone marrow biopsy     revealing 48% plasma cells, diagnosed on 10/21/2009. 2. Status post 9 months of melphalan with prednisone and subcutaneous     Velcade from April 2011 through March 2012 with near complete     response to therapy. 3. Biochemical progression without renal involvement or hypercalcemia     and was initiated on Revlimid with weekly Decadron on August 19, 2011.  Required dose reduction with cycle 3,  now dosed at 50 mg on     a 2-on 1-off schedule.  Continues on Decadron 12 mg weekly. John Carter will continue with therapy and proceed with cycle 6 tomorrow.  He follows up in 2 weeks' time prior to cycle 7.  He will have  Myeloma markers with skeletal survey prior to that treatment.    ______________________________ John Carter, M.D. LIO/MEDQ  D:  12/14/2011  T:  12/14/2011  Job:  [id number]

## 2011-12-21 ENCOUNTER — Other Ambulatory Visit: Payer: Self-pay | Admitting: *Deleted

## 2011-12-21 DIAGNOSIS — C9000 Multiple myeloma not having achieved remission: Secondary | ICD-10-CM

## 2011-12-21 MED ORDER — LENALIDOMIDE 15 MG PO CAPS: 15.0000 mg | ORAL_CAPSULE | Freq: Every day | ORAL | Status: DC

## 2011-12-30 ENCOUNTER — Ambulatory Visit (HOSPITAL_COMMUNITY)
Admission: RE | Admit: 2011-12-30 | Discharge: 2011-12-30 | Disposition: A | Discharge status: Home / Self Care | Payer: Medicare Other | Source: Ambulatory Visit | Attending: Hematology and Oncology | Admitting: Hematology and Oncology

## 2011-12-30 ENCOUNTER — Other Ambulatory Visit: Payer: Medicare Other | Admitting: Lab

## 2011-12-30 DIAGNOSIS — C9000 Multiple myeloma not having achieved remission: Secondary | ICD-10-CM | POA: Insufficient documentation

## 2012-01-03 LAB — KAPPA/LAMBDA LIGHT CHAINS: Lambda Free Lght Chn: 1.46 mg/dL (ref 0.57–2.63)

## 2012-01-03 LAB — COMPREHENSIVE METABOLIC PANEL
ALT: 19 U/L (ref 0–53)
Albumin: 3.5 g/dL (ref 3.5–5.2)
Alkaline Phosphatase: 96 U/L (ref 39–117)
Glucose, Bld: 133 mg/dL — ABNORMAL HIGH (ref 70–99)
Potassium: 4.1 mEq/L (ref 3.5–5.3)
Sodium: 140 mEq/L (ref 135–145)
Total Bilirubin: 0.3 mg/dL (ref 0.3–1.2)
Total Protein: 6.3 g/dL (ref 6.0–8.3)

## 2012-01-03 LAB — SPEP & IFE WITH QIG
Gamma Globulin: 17.7 % (ref 11.1–18.8)
IgG (Immunoglobin G), Serum: 1400 mg/dL (ref 650–1600)
IgM, Serum: 28 mg/dL — ABNORMAL LOW (ref 41–251)
M-Spike, %: 0.82 g/dL

## 2012-01-03 LAB — BETA 2 MICROGLOBULIN, SERUM: Beta-2 Microglobulin: 2.5 mg/L — ABNORMAL HIGH (ref 1.01–1.73)

## 2012-01-05 ENCOUNTER — Ambulatory Visit (HOSPITAL_BASED_OUTPATIENT_CLINIC_OR_DEPARTMENT_OTHER): Payer: Medicare Other | Admitting: Hematology and Oncology

## 2012-01-05 ENCOUNTER — Encounter: Payer: Self-pay | Admitting: Hematology and Oncology

## 2012-01-05 ENCOUNTER — Telehealth: Payer: Self-pay | Admitting: Hematology and Oncology

## 2012-01-05 VITALS — BP 151/74 | HR 90 | Temp 97.9°F | Ht 68.0 in | Wt 194.9 lb

## 2012-01-05 DIAGNOSIS — C9000 Multiple myeloma not having achieved remission: Secondary | ICD-10-CM

## 2012-01-05 NOTE — Progress Notes (Signed)
This office note has been dictated.

## 2012-01-05 NOTE — Addendum Note (Signed)
Addended by: Arlan Organ I on: 01/05/2012 01:18 PM   Modules accepted: Orders

## 2012-01-05 NOTE — Progress Notes (Signed)
CC:   [name], M.D.  IDENTIFYING STATEMENT:  Patient is an 78 year old man with multiple myeloma who presents for followup.  INTERVAL HISTORY:  Mr. Parfait has completed 6 cycles of Revlimid with weekly Decadron for recurrence of multiple myeloma.  He is tolerating the current dose with very minimal complaints.  His blood sugars appear adequate.  He denies pain.  He is without nausea or vomiting.  Reviewed labs below.  MEDICATIONS:  Revlimid 15 mg for 2 weeks followed by a week's rest. Decadron 12 mg weekly.  Rest of medicines reviewed and updated.  PAST MEDICAL HISTORY/FAMILY HISTORY/SOCIAL HISTORY:  Unchanged.  REVIEW OF SYSTEMS:  Ten point review of systems negative.  PHYSICAL EXAM:  The patient is a well-appearing, well-nourished man in no distress.  Vitals:  Pulse 90, blood pressure 151/74, temperature 97.9, respirations 20, weight 194.9 pounds.  HEENT:  Head is atraumatic, normocephalic.  Sclerae anicteric.  Mouth is moist.  Neck:  Supple. Chest:  Clear.  CVS:  Unremarkable.  Abdomen:  Soft, nontender.  Bowel sounds present.  Extremities:  No edema.  LAB DATA:  12/30/2011 sodium 140, potassium 4.1, chloride 105, CO2 25, BUN 24, creatinine 1.09, glucose 133.  T bilirubin 0.3, alkaline phosphatase 96, AST 15, ALT 19.  IgG 1400 (3450), IgM 28 (20), IgA 86 (29).  M spike 0.82 g/dL(2.96 g/dL).  Kappa free light chain 5.50.  Lambda free light chain was normal at 1.46.  Beta 2 glycoprotein was 2.50 (4.50).  A skeletal survey on 12/30/2011 showed no evidence for skeletal multiple myeloma.  IMPRESSION AND PLAN:  Mr. Voris is an 78 year old man with: 1. IgG kappa multiple myeloma status post bone marrow biopsy revealing     48% plasma cells diagnosed on 10/21/2009. 2. Status post 9 months of melphalan with prednisone and subcutaneous     Velcade from April 2011 through March 2012 with near complete     response to therapy. 3. Biochemical progression without renal  involvement or hypercalcemia,     and was initiated on Revlimid with weekly Decadron on August 19, 2011.  Following 6 cycles he has had very good response and his IgG     levels have normalized.  Mr. Hogeland will continue with     "maintenance therapy".  Will start to taper Decadron.  He is to     take 8 mg weekly x3 and then 4 mg weekly x3.  He will remain on     Revlimid at 15 mg daily on a 2 on 1 week off basis.  He follows up     in 3 weeks' time.    ______________________________ Laurice Record, M.D. LIO/MEDQ  D:  01/05/2012  T:  01/05/2012  Job:  [id number]

## 2012-01-05 NOTE — Patient Instructions (Signed)
SAHMIR WEATHERBEE  [id number]  [facility] Discharge Instructions  RECOMMENDATIONS MADE BY THE CONSULTANT AND ANY TEST RESULTS WILL BE SENT TO YOUR REFERRING DOCTOR.   EXAM FINDINGS BY MD TODAY AND SIGNS AND SYMPTOMS TO REPORT TO CLINIC OR PRIMARY MD:   Your current list of medications are: Current Outpatient Prescriptions  Medication Sig Dispense Refill  . amLODipine (NORVASC) 5 MG tablet Take 5 mg by mouth daily.        Marland Kitchen aspirin 325 MG EC tablet Take 325 mg by mouth daily.        . cetirizine-pseudoephedrine (ZYRTEC-D) 5-120 MG per tablet Take 1 tablet by mouth daily.       . clopidogrel (PLAVIX) 75 MG tablet Take 75 mg by mouth daily.       Marland Kitchen dexamethasone (DECADRON) 4 MG tablet Take 12 mg by mouth once a week. Take  3  Tablets  Po  Weekly  As directed.  Normally takes on Wednesday.      . flunisolide (NASAREL) 29 MCG/ACT (0.025%) nasal spray Place 2 sprays into the nose at bedtime. Dose is for each nostril.       Marland Kitchen glimepiride (AMARYL) 1 MG tablet Take 1 mg by mouth daily before breakfast.      . Glucosamine-Chondroit-Vit C-Mn (GLUCOSAMINE 1500 COMPLEX) CAPS Take 2 capsules by mouth daily.      Marland Kitchen lenalidomide (REVLIMID) 15 MG capsule Take 15 mg by mouth daily. Take 15 mg po daily x  2  Weeks,   Then  Off   1  Week.      . metoprolol (TOPROL-XL) 50 MG 24 hr tablet Take 50 mg by mouth daily.        . Multiple Vitamins-Minerals (MULTIVITAMINS THER. W/MINERALS) TABS Take 1 tablet by mouth daily. Centrum silver      . nitroGLYCERIN (NITROSTAT) 0.4 MG SL tablet Place 0.4 mg under the tongue every 5 (five) minutes as needed. For chest pain       . omeprazole (PRILOSEC) 20 MG capsule Take 20 mg by mouth daily.        . prochlorperazine (COMPAZINE) 10 MG tablet Take 10 mg by mouth every 8 (eight) hours as needed. Gave original rx to [place], [place]  08/06/11. # 30 ;  No refill.      . simvastatin (ZOCOR) 20 MG tablet Take 20 mg by mouth at bedtime.        . valsartan (DIOVAN)  320 MG tablet Take 320 mg by mouth daily.       . Vitamin D, Ergocalciferol, (DRISDOL) 50000 UNITS CAPS Take 50,000 Units by mouth every 14 (fourteen) days.          INSTRUCTIONS GIVEN AND DISCUSSED:   SPECIAL INSTRUCTIONS/FOLLOW-UP:  See above.  I acknowledge that I have been informed and understand all the instructions given to me and received a copy. I do not have any more questions at this time, but understand that I may call the [facility] at [phone] during business hours should I have any further questions or need assistance in obtaining follow-up care.

## 2012-01-05 NOTE — Telephone Encounter (Signed)
APPTS MADE AND PRINTED FOR PT AOM °

## 2012-01-12 ENCOUNTER — Other Ambulatory Visit: Payer: Self-pay | Admitting: Nurse Practitioner

## 2012-01-12 DIAGNOSIS — C9000 Multiple myeloma not having achieved remission: Secondary | ICD-10-CM

## 2012-01-12 MED ORDER — LENALIDOMIDE 15 MG PO CAPS: 15.0000 mg | ORAL_CAPSULE | Freq: Every day | ORAL | Status: DC

## 2012-01-12 NOTE — Telephone Encounter (Signed)
Revlimid prescription faxed to [facility] [phone].

## 2012-01-26 ENCOUNTER — Ambulatory Visit (HOSPITAL_BASED_OUTPATIENT_CLINIC_OR_DEPARTMENT_OTHER): Payer: Medicare Other | Admitting: Hematology and Oncology

## 2012-01-26 ENCOUNTER — Telehealth: Payer: Self-pay | Admitting: Hematology and Oncology

## 2012-01-26 ENCOUNTER — Encounter: Payer: Self-pay | Admitting: Hematology and Oncology

## 2012-01-26 ENCOUNTER — Other Ambulatory Visit (HOSPITAL_BASED_OUTPATIENT_CLINIC_OR_DEPARTMENT_OTHER): Payer: Medicare Other

## 2012-01-26 VITALS — BP 134/71 | HR 70 | Temp 97.4°F | Ht 68.0 in | Wt 197.1 lb

## 2012-01-26 DIAGNOSIS — C9000 Multiple myeloma not having achieved remission: Secondary | ICD-10-CM

## 2012-01-26 LAB — COMPREHENSIVE METABOLIC PANEL
Albumin: 3.2 g/dL — ABNORMAL LOW (ref 3.5–5.2)
Alkaline Phosphatase: 96 U/L (ref 39–117)
BUN: 26 mg/dL — ABNORMAL HIGH (ref 6–23)
Glucose, Bld: 166 mg/dL — ABNORMAL HIGH (ref 70–99)
Potassium: 4.2 mEq/L (ref 3.5–5.3)
Total Bilirubin: 0.3 mg/dL (ref 0.3–1.2)

## 2012-01-26 LAB — CBC WITH DIFFERENTIAL/PLATELET
Basophils Absolute: 0 10*3/uL (ref 0.0–0.1)
Eosinophils Absolute: 0.1 10*3/uL (ref 0.0–0.5)
LYMPH%: 27.9 % (ref 14.0–49.0)
MCH: 33.5 pg — ABNORMAL HIGH (ref 27.2–33.4)
MCHC: 32.9 g/dL (ref 32.0–36.0)
MONO%: 21.5 % — ABNORMAL HIGH (ref 0.0–14.0)
NEUT%: 45.9 % (ref 39.0–75.0)
WBC: 3.4 10*3/uL — ABNORMAL LOW (ref 4.0–10.3)

## 2012-01-26 NOTE — Progress Notes (Signed)
CC:   [name], M.D.  IDENTIFYING STATEMENT:  Patient is an 78 year old man with multiple myeloma who presents for followup.  INTERVAL HISTORY:  Mr. Teed is now on maintenance Revlimid.  He is also tapering Decadron.  He is feeling well.  Has no complaints.  He has good energy levels.  MEDICATIONS:  Revlimid 15 mg for 2 weeks followed by a week's rest. Decadron taper currently at 4 mg weekly.  Rest of medicines reviewed and updated.  PAST MEDICAL HISTORY/FAMILY HISTORY/SOCIAL HISTORY:  Unchanged.  REVIEW OF SYSTEMS:  10 point review of systems is negative.  PHYSICAL EXAM:  The patient is a well-appearing, well-nourished man in no distress.  Vitals:  Pulse 70, blood pressure 134/71, temperature 97.4, respirations 18, weight 197.1 pounds.  HEENT:  Head is atraumatic, normocephalic.  Sclerae anicteric.  Mouth moist.  Chest:  Clear.  CVS: Unremarkable.  Abdomen:  Soft, nontender.  Bowel sounds present. Extremities:  No edema.  LABORATORY DATA:  01/26/2012 white cell count 3.4, hemoglobin 10.6, hematocrit 32.1, platelets 113, ANC 1600.  CMET pending.  IMPRESSION AND PLAN:  Mr. Kocak is an 78 year old man with: 1. IgG kappa multiple myeloma status post bone marrow biopsy revealing     48% plasma cells diagnosed on 10/21/2009. 2. Status post 9 months of melphalan, prednisone and subcutaneous     Velcade from April 2011 through March 2012 with near complete     response to therapy. 3. Biochemical progression without renal involvement or hypercalcemia,     was initiated on Revlimid with weekly Decadron on August 19, 2011.     Following 6 cycles, had a good response and his IgG levels had     normalized.  Mr. Colello was then placed on maintenance Revlimid     and he will continue.  He follows up in 3 weeks' time.    ______________________________ Laurice Record, M.D. LIO/MEDQ  D:  01/26/2012  T:  01/26/2012  Job:  [id number]

## 2012-01-26 NOTE — Telephone Encounter (Signed)
Gave pt appt calendar for 02/16/12 lab and MD

## 2012-01-26 NOTE — Patient Instructions (Addendum)
John Carter  [id number]  [facility] Discharge Instructions  RECOMMENDATIONS MADE BY THE CONSULTANT AND ANY TEST RESULTS WILL BE SENT TO YOUR REFERRING DOCTOR.   EXAM FINDINGS BY MD TODAY AND SIGNS AND SYMPTOMS TO REPORT TO CLINIC OR PRIMARY MD:   Your current list of medications are: Current Outpatient Prescriptions  Medication Sig Dispense Refill  . amLODipine (NORVASC) 5 MG tablet Take 5 mg by mouth daily.        Marland Kitchen aspirin 325 MG EC tablet Take 325 mg by mouth daily.        . cetirizine-pseudoephedrine (ZYRTEC-D) 5-120 MG per tablet Take 1 tablet by mouth daily.       . clopidogrel (PLAVIX) 75 MG tablet Take 75 mg by mouth daily.       Marland Kitchen dexamethasone (DECADRON) 4 MG tablet Take 12 mg by mouth once a week. Take  3  Tablets  Po  Weekly  As directed.  Normally takes on Wednesday.      . flunisolide (NASAREL) 29 MCG/ACT (0.025%) nasal spray Place 2 sprays into the nose at bedtime. Dose is for each nostril.       Marland Kitchen glimepiride (AMARYL) 1 MG tablet Take 1 mg by mouth daily before breakfast.      . Glucosamine-Chondroit-Vit C-Mn (GLUCOSAMINE 1500 COMPLEX) CAPS Take 2 capsules by mouth daily.      Marland Kitchen lenalidomide (REVLIMID) 15 MG capsule Take 1 capsule (15 mg total) by mouth daily. Take 1 capsule by mouth daily for 2 weeks.  Then take 1 week off.   Authorization number [id number]  14 capsule  0  . metoprolol (TOPROL-XL) 50 MG 24 hr tablet Take 50 mg by mouth daily.        . Multiple Vitamins-Minerals (MULTIVITAMINS THER. W/MINERALS) TABS Take 1 tablet by mouth daily. Centrum silver      . nitroGLYCERIN (NITROSTAT) 0.4 MG SL tablet Place 0.4 mg under the tongue every 5 (five) minutes as needed. For chest pain       . omeprazole (PRILOSEC) 20 MG capsule Take 20 mg by mouth daily.        . prochlorperazine (COMPAZINE) 10 MG tablet Take 10 mg by mouth every 8 (eight) hours as needed. Gave original rx to [place], [place]  08/06/11. # 30 ;  No refill.      . simvastatin (ZOCOR) 20 MG  tablet Take 20 mg by mouth at bedtime.        . valsartan (DIOVAN) 320 MG tablet Take 320 mg by mouth daily.       . Vitamin D, Ergocalciferol, (DRISDOL) 50000 UNITS CAPS Take 50,000 Units by mouth every 14 (fourteen) days.          INSTRUCTIONS GIVEN AND DISCUSSED:   SPECIAL INSTRUCTIONS/FOLLOW-UP:  See above.  I acknowledge that I have been informed and understand all the instructions given to me and received a copy. I do not have any more questions at this time, but understand that I may call the [facility] at [phone] during business hours should I have any further questions or need assistance in obtaining follow-up care.

## 2012-01-26 NOTE — Progress Notes (Signed)
This office note has been dictated.

## 2012-02-01 ENCOUNTER — Other Ambulatory Visit: Payer: Self-pay | Admitting: *Deleted

## 2012-02-01 DIAGNOSIS — C9000 Multiple myeloma not having achieved remission: Secondary | ICD-10-CM

## 2012-02-01 MED ORDER — LENALIDOMIDE 15 MG PO CAPS: 15.0000 mg | ORAL_CAPSULE | Freq: Every day | ORAL | Status: DC

## 2012-02-16 ENCOUNTER — Other Ambulatory Visit (HOSPITAL_BASED_OUTPATIENT_CLINIC_OR_DEPARTMENT_OTHER): Payer: Medicare Other | Admitting: Lab

## 2012-02-16 ENCOUNTER — Ambulatory Visit (HOSPITAL_BASED_OUTPATIENT_CLINIC_OR_DEPARTMENT_OTHER): Payer: Medicare Other | Admitting: Hematology and Oncology

## 2012-02-16 ENCOUNTER — Telehealth: Payer: Self-pay | Admitting: Hematology and Oncology

## 2012-02-16 ENCOUNTER — Encounter: Payer: Self-pay | Admitting: Hematology and Oncology

## 2012-02-16 VITALS — BP 148/69 | HR 85 | Temp 97.0°F | Ht 68.0 in | Wt 196.5 lb

## 2012-02-16 DIAGNOSIS — C9000 Multiple myeloma not having achieved remission: Secondary | ICD-10-CM

## 2012-02-16 LAB — CBC WITH DIFFERENTIAL/PLATELET
Basophils Absolute: 0 10*3/uL (ref 0.0–0.1)
Eosinophils Absolute: 0.1 10*3/uL (ref 0.0–0.5)
HGB: 10.5 g/dL — ABNORMAL LOW (ref 13.0–17.1)
MCV: 100.1 fL — ABNORMAL HIGH (ref 79.3–98.0)
MONO%: 19 % — ABNORMAL HIGH (ref 0.0–14.0)
NEUT#: 2.3 10*3/uL (ref 1.5–6.5)
RDW: 16 % — ABNORMAL HIGH (ref 11.0–14.6)

## 2012-02-16 LAB — COMPREHENSIVE METABOLIC PANEL
Albumin: 3.4 g/dL — ABNORMAL LOW (ref 3.5–5.2)
BUN: 33 mg/dL — ABNORMAL HIGH (ref 6–23)
CO2: 21 mEq/L (ref 19–32)
Calcium: 8.8 mg/dL (ref 8.4–10.5)
Chloride: 110 mEq/L (ref 96–112)
Glucose, Bld: 113 mg/dL — ABNORMAL HIGH (ref 70–99)
Potassium: 4 mEq/L (ref 3.5–5.3)

## 2012-02-16 NOTE — Patient Instructions (Addendum)
John Carter  [id number]  [facility] Discharge Instructions  RECOMMENDATIONS MADE BY THE CONSULTANT AND ANY TEST RESULTS WILL BE SENT TO YOUR REFERRING DOCTOR.   EXAM FINDINGS BY MD TODAY AND SIGNS AND SYMPTOMS TO REPORT TO CLINIC OR PRIMARY MD:   Your current list of medications are: Current Outpatient Prescriptions  Medication Sig Dispense Refill  . amLODipine (NORVASC) 5 MG tablet Take 5 mg by mouth daily.        Marland Kitchen aspirin 325 MG EC tablet Take 325 mg by mouth daily.        . cetirizine-pseudoephedrine (ZYRTEC-D) 5-120 MG per tablet Take 1 tablet by mouth daily.       . clopidogrel (PLAVIX) 75 MG tablet Take 75 mg by mouth daily.       Marland Kitchen dexamethasone (DECADRON) 4 MG tablet Take 12 mg by mouth once a week. Take  3  Tablets  Po  Weekly  As directed.  Normally takes on Wednesday.      . flunisolide (NASAREL) 29 MCG/ACT (0.025%) nasal spray Place 2 sprays into the nose at bedtime. Dose is for each nostril.       Marland Kitchen glimepiride (AMARYL) 1 MG tablet Take 1 mg by mouth daily before breakfast.      . Glucosamine-Chondroit-Vit C-Mn (GLUCOSAMINE 1500 COMPLEX) CAPS Take 2 capsules by mouth daily.      Marland Kitchen lenalidomide (REVLIMID) 15 MG capsule Take 1 capsule (15 mg total) by mouth daily. Take 1 capsule by mouth daily for 2 weeks.  Then take 1 week off.   Authorization  #   [id number]    02/01/12.  14 capsule  0  . metoprolol (TOPROL-XL) 50 MG 24 hr tablet Take 50 mg by mouth daily.        . Multiple Vitamins-Minerals (MULTIVITAMINS THER. W/MINERALS) TABS Take 1 tablet by mouth daily. Centrum silver      . nitroGLYCERIN (NITROSTAT) 0.4 MG SL tablet Place 0.4 mg under the tongue every 5 (five) minutes as needed. For chest pain       . omeprazole (PRILOSEC) 20 MG capsule Take 20 mg by mouth daily.        . simvastatin (ZOCOR) 20 MG tablet Take 20 mg by mouth at bedtime.        . valsartan (DIOVAN) 320 MG tablet Take 320 mg by mouth daily.       . Vitamin D, Ergocalciferol, (DRISDOL)  50000 UNITS CAPS Take 50,000 Units by mouth every 14 (fourteen) days.          INSTRUCTIONS GIVEN AND DISCUSSED:   SPECIAL INSTRUCTIONS/FOLLOW-UP:  See above.  I acknowledge that I have been informed and understand all the instructions given to me and received a copy. I do not have any more questions at this time, but understand that I may call the [facility] at [phone] during business hours should I have any further questions or need assistance in obtaining follow-up care.

## 2012-02-16 NOTE — Telephone Encounter (Signed)
appts made and printed for pt aom °

## 2012-02-16 NOTE — Progress Notes (Signed)
HEMATOLOGY/ONCOLOGY OFFICE FOLLOW UP VISIT   John Carter [id number] [birth year] 78 y.o. 02/16/2012 11:10 AM   CC: Dr. [name]  IDENTIFYING STATEMENT: Patient is an 78 year old man with multiple myeloma who presents for followup.  INTERVAL HISTORY:  [name] continues on maintenance Revlimid. He has no current complaints. His energy levels are at baseline. He denies diarrhea. Denies mucositis.  Medications: I have reviewed the patient's current medications. Revlimid 15 mg for 2 weeks followed by week's rest. Decadron taper currently 4 mg every other week.  Allergies:  Allergies  Allergen Reactions  . Sulfa Drugs Cross Reactors Rash    PAST MEDICAL HISTORY/SURGICAL HISTORY/SOCIAL HISTORY/FAMILY HISTORY: Unchanged.   REVIEW OF SYSTEMS: 10 point review of systems is negative.    Physical Exam:  Blood pressure 148/69, pulse 85, temperature 97 F (36.1 C), temperature source Oral, height 5\' 8"  (1.727 m), weight 196 lb 8 oz (89.132 kg).   General appearance: alert, cooperative and appears stated age Resp: clear to auscultation bilaterally and normal percussion bilaterally Cardio: regular rate and rhythm, S1, S2 normal, no murmur, click, rub or gallop GI: soft, non-tender; bowel sounds normal; no masses,  no organomegaly Extremities: extremities normal, atraumatic, no cyanosis or edema CNS: non-focal Lymph nodes - no adenopathy.  LAB RESULTS: Lab Results  Component Value Date   WBC 4.6 02/16/2012   HGB 10.5* 02/16/2012   HCT 31.3* 02/16/2012   MCV 100.1* 02/16/2012   PLT 109* 02/16/2012     Chemistry      Component Value Date/Time   NA 140 01/26/2012 0931   K 4.2 01/26/2012 0931   CL 110 01/26/2012 0931   CO2 20 01/26/2012 0931   BUN 26* 01/26/2012 0931   CREATININE 1.00 01/26/2012 0931      Component Value Date/Time   CALCIUM 8.3* 01/26/2012 0931   ALKPHOS 96 01/26/2012 0931   AST 14 01/26/2012 0931   ALT 18 01/26/2012 0931   BILITOT 0.3 01/26/2012 0931         RADIOLOGICAL STUDIES No results found.    IMPRESSION AND PLAN    John Carter I., MD 02/16/2012

## 2012-02-17 ENCOUNTER — Telehealth: Payer: Self-pay | Admitting: *Deleted

## 2012-02-17 NOTE — Telephone Encounter (Signed)
Gave patient appointment for 03-07-2012 starting at 11:00am

## 2012-02-23 ENCOUNTER — Other Ambulatory Visit: Payer: Self-pay | Admitting: *Deleted

## 2012-02-23 DIAGNOSIS — C9000 Multiple myeloma not having achieved remission: Secondary | ICD-10-CM

## 2012-02-23 MED ORDER — LENALIDOMIDE 15 MG PO CAPS: 15.0000 mg | ORAL_CAPSULE | Freq: Every day | ORAL | Status: DC

## 2012-03-07 ENCOUNTER — Other Ambulatory Visit (HOSPITAL_BASED_OUTPATIENT_CLINIC_OR_DEPARTMENT_OTHER): Payer: Medicare Other | Admitting: Lab

## 2012-03-07 ENCOUNTER — Encounter: Payer: Self-pay | Admitting: Hematology and Oncology

## 2012-03-07 ENCOUNTER — Ambulatory Visit (HOSPITAL_BASED_OUTPATIENT_CLINIC_OR_DEPARTMENT_OTHER): Payer: Medicare Other | Admitting: Hematology and Oncology

## 2012-03-07 ENCOUNTER — Telehealth: Payer: Self-pay | Admitting: Hematology and Oncology

## 2012-03-07 VITALS — BP 151/66 | HR 74 | Temp 97.7°F | Resp 20 | Ht 68.0 in | Wt 196.9 lb

## 2012-03-07 DIAGNOSIS — C9000 Multiple myeloma not having achieved remission: Secondary | ICD-10-CM

## 2012-03-07 LAB — COMPREHENSIVE METABOLIC PANEL
ALT: 19 U/L (ref 0–53)
Albumin: 3.8 g/dL (ref 3.5–5.2)
BUN: 24 mg/dL — ABNORMAL HIGH (ref 6–23)
CO2: 26 mEq/L (ref 19–32)
Calcium: 9 mg/dL (ref 8.4–10.5)
Chloride: 106 mEq/L (ref 96–112)
Creatinine, Ser: 1.08 mg/dL (ref 0.50–1.35)
Potassium: 4.4 mEq/L (ref 3.5–5.3)

## 2012-03-07 LAB — CBC WITH DIFFERENTIAL/PLATELET
Basophils Absolute: 0 10*3/uL (ref 0.0–0.1)
Eosinophils Absolute: 0.1 10*3/uL (ref 0.0–0.5)
HGB: 11.8 g/dL — ABNORMAL LOW (ref 13.0–17.1)
NEUT#: 1.9 10*3/uL (ref 1.5–6.5)
RDW: 16.1 % — ABNORMAL HIGH (ref 11.0–14.6)
WBC: 4 10*3/uL (ref 4.0–10.3)
lymph#: 1.3 10*3/uL (ref 0.9–3.3)

## 2012-03-07 NOTE — Progress Notes (Signed)
CC:   John Carter, M.D.  IDENTIFYING STATEMENT:  The patient is an 78 year old man with multiple myeloma who presents for followup.  INTERVAL HISTORY:  John Carter is in general tolerating Revlimid. Notes mild diarrhea.  Has good fluid intake.  Denies nausea or vomiting. Denies fever or chills.  MEDICATIONS:  Revlimid 15 mg for 2 weeks, followed by a week's rest. Discontinues Decadron at the end of this month.  Rest of medicines reviewed and updated.  PAST MEDICAL HISTORY/FAMILY HISTORY/SOCIAL HISTORY:  Unchanged.  REVIEW OF SYSTEMS:  A 10-point review of systems is negative.  PHYSICAL EXAMINATION:  General:  The patient is a well-appearing, well- nourished man in no distress.  Vitals:  Pulse 74, blood pressure 151/64, temperature 97.7, respirations 20, weight 196.9 pounds.  HEENT:  Head is atraumatic, normocephalic.  Sclerae are anicteric.  Mouth moist.  Chest: Clear.  CVS:  Unremarkable.  Abdomen:  Soft, nontender.  Bowel sounds present.  Extremities:  No calf tenderness.  LABORATORY DATA:  White cell count 4, hemoglobin 11, hematocrit 35.9, platelets 108.  CMET pending.  IMPRESSION AND PLAN:  John Carter is an 78 year old man with: 1. IgG kappa multiple myeloma, status post bone marrow biopsy     revealing 48% plasma cells, diagnosed on 10/21/2009. 2. Status post 9 months of melphalan, prednisone, and subcu Velcade     from April 2011 through March 2012.  Near complete response to     therapy. 3. Biochemical progression without renal involvement for     hypercalcemia, thus was initiated on Revlimid with weekly Decadron     on August 19, 2011.  Following 6 cycles, had a good response and     his IgG levels had normalized.  John Carter is now on maintenance     Revlimid, 2 weeks on, 1 week off basis.  He has labs drawn in 3     weeks' time on 03/28/2012.  He follows up on 04/18/2012.    ______________________________ Laurice Record, M.D. LIO/MEDQ  D:  03/07/2012   T:  03/07/2012  Job:  [id number]

## 2012-03-07 NOTE — Patient Instructions (Signed)
John Carter  [id number]  [facility] Discharge Instructions  RECOMMENDATIONS MADE BY THE CONSULTANT AND ANY TEST RESULTS WILL BE SENT TO YOUR REFERRING DOCTOR.   EXAM FINDINGS BY MD TODAY AND SIGNS AND SYMPTOMS TO REPORT TO CLINIC OR PRIMARY MD:   Your current list of medications are: Current Outpatient Prescriptions  Medication Sig Dispense Refill  . amLODipine (NORVASC) 5 MG tablet Take 5 mg by mouth daily.        Marland Kitchen aspirin 325 MG EC tablet Take 325 mg by mouth daily.        . cetirizine-pseudoephedrine (ZYRTEC-D) 5-120 MG per tablet Take 1 tablet by mouth daily.       . clopidogrel (PLAVIX) 75 MG tablet Take 75 mg by mouth daily.       Marland Kitchen dexamethasone (DECADRON) 4 MG tablet Take 12 mg by mouth once a week. Take  3  Tablets  Po  Weekly  As directed.  Normally takes on Wednesday.      . flunisolide (NASAREL) 29 MCG/ACT (0.025%) nasal spray Place 2 sprays into the nose at bedtime. Dose is for each nostril.       Marland Kitchen glimepiride (AMARYL) 1 MG tablet Take 1 mg by mouth daily before breakfast.      . Glucosamine-Chondroit-Vit C-Mn (GLUCOSAMINE 1500 COMPLEX) CAPS Take 2 capsules by mouth daily.      Marland Kitchen lenalidomide (REVLIMID) 15 MG capsule Take 1 capsule (15 mg total) by mouth daily. Take 1 capsule by mouth daily for 2 weeks.  Then take 1 week off.   Authorization  #   [id number]      02/23/12  14 capsule  0  . metoprolol (TOPROL-XL) 50 MG 24 hr tablet Take 50 mg by mouth daily.        . Multiple Vitamins-Minerals (MULTIVITAMINS THER. W/MINERALS) TABS Take 1 tablet by mouth daily. Centrum silver      . nitroGLYCERIN (NITROSTAT) 0.4 MG SL tablet Place 0.4 mg under the tongue every 5 (five) minutes as needed. For chest pain       . omeprazole (PRILOSEC) 20 MG capsule Take 20 mg by mouth daily.        . simvastatin (ZOCOR) 20 MG tablet Take 20 mg by mouth at bedtime.        . valsartan (DIOVAN) 320 MG tablet Take 320 mg by mouth daily.       . Vitamin D, Ergocalciferol, (DRISDOL)  50000 UNITS CAPS Take 50,000 Units by mouth every 14 (fourteen) days.          INSTRUCTIONS GIVEN AND DISCUSSED:   SPECIAL INSTRUCTIONS/FOLLOW-UP:  See above.  I acknowledge that I have been informed and understand all the instructions given to me and received a copy. I do not have any more questions at this time, but understand that I may call the [facility] at [phone] during business hours should I have any further questions or need assistance in obtaining follow-up care.

## 2012-03-07 NOTE — Telephone Encounter (Signed)
appts made and printed for pt aom °

## 2012-03-07 NOTE — Progress Notes (Signed)
This office note has been dictated.

## 2012-03-10 ENCOUNTER — Other Ambulatory Visit: Payer: Self-pay | Admitting: *Deleted

## 2012-03-10 DIAGNOSIS — C9000 Multiple myeloma not having achieved remission: Secondary | ICD-10-CM

## 2012-03-10 MED ORDER — LENALIDOMIDE 15 MG PO CAPS: 15.0000 mg | ORAL_CAPSULE | Freq: Every day | ORAL | Status: DC

## 2012-03-10 NOTE — Telephone Encounter (Signed)
THIS REFILL REQUEST FOR REVLIMID WAS PLACED IN DR.ODOGWU'S ACTIVE WORK BIN. ALSO NOTIFIED DR.ODOGWU'S NURSE, THU BRAY,RN.

## 2012-03-28 ENCOUNTER — Other Ambulatory Visit (HOSPITAL_BASED_OUTPATIENT_CLINIC_OR_DEPARTMENT_OTHER): Payer: Medicare Other | Admitting: Lab

## 2012-03-28 ENCOUNTER — Telehealth: Payer: Self-pay | Admitting: Nurse Practitioner

## 2012-03-28 DIAGNOSIS — C9000 Multiple myeloma not having achieved remission: Secondary | ICD-10-CM

## 2012-03-28 LAB — COMPREHENSIVE METABOLIC PANEL (CC13)
ALT: 19 U/L (ref 0–55)
AST: 16 U/L (ref 5–34)
Albumin: 3.5 g/dL (ref 3.5–5.0)
CO2: 25 mEq/L (ref 22–29)
Calcium: 8.9 mg/dL (ref 8.4–10.4)
Chloride: 108 mEq/L — ABNORMAL HIGH (ref 98–107)
Creatinine: 1 mg/dL (ref 0.7–1.3)
Potassium: 4.4 mEq/L (ref 3.5–5.1)
Sodium: 141 mEq/L (ref 136–145)
Total Protein: 6.7 g/dL (ref 6.4–8.3)

## 2012-03-28 LAB — CBC WITH DIFFERENTIAL/PLATELET
BASO%: 1.3 % (ref 0.0–2.0)
EOS%: 4.1 % (ref 0.0–7.0)
HCT: 34.6 % — ABNORMAL LOW (ref 38.4–49.9)
MCH: 33.4 pg (ref 27.2–33.4)
MCHC: 33.7 g/dL (ref 32.0–36.0)
MONO#: 0.7 10*3/uL (ref 0.1–0.9)
NEUT%: 42 % (ref 39.0–75.0)
RDW: 15.9 % — ABNORMAL HIGH (ref 11.0–14.6)
WBC: 3.7 10*3/uL — ABNORMAL LOW (ref 4.0–10.3)
lymph#: 1.2 10*3/uL (ref 0.9–3.3)

## 2012-03-28 NOTE — Telephone Encounter (Signed)
Informed patient platelets are a little low per Dr. Dalene Carrow.  Instructed pt to hold Revlimid for one week, recheck bloodwork next week, and do not take Revlimid until receives call from this office.  Per Dr. Dalene Carrow- may dose reduce if CBC continues to show low counts.  Order sent to scheduler for lab appointment next week.  Pt verbalized understanding.

## 2012-03-30 ENCOUNTER — Telehealth: Payer: Self-pay | Admitting: *Deleted

## 2012-03-30 NOTE — Telephone Encounter (Signed)
Patient confirmed over the phone the lab only appointment 04-04-2012 8:15am

## 2012-04-03 ENCOUNTER — Other Ambulatory Visit: Payer: Self-pay | Admitting: *Deleted

## 2012-04-04 ENCOUNTER — Other Ambulatory Visit: Payer: Self-pay | Admitting: *Deleted

## 2012-04-04 ENCOUNTER — Other Ambulatory Visit (HOSPITAL_BASED_OUTPATIENT_CLINIC_OR_DEPARTMENT_OTHER): Payer: Medicare Other

## 2012-04-04 ENCOUNTER — Telehealth: Payer: Self-pay | Admitting: *Deleted

## 2012-04-04 DIAGNOSIS — C9000 Multiple myeloma not having achieved remission: Secondary | ICD-10-CM

## 2012-04-04 LAB — CBC WITH DIFFERENTIAL/PLATELET
BASO%: 1.9 % (ref 0.0–2.0)
EOS%: 5.6 % (ref 0.0–7.0)
HCT: 36.6 % — ABNORMAL LOW (ref 38.4–49.9)
MCH: 33.8 pg — ABNORMAL HIGH (ref 27.2–33.4)
MCHC: 34 g/dL (ref 32.0–36.0)
MONO#: 0.7 10*3/uL (ref 0.1–0.9)
NEUT%: 42.5 % (ref 39.0–75.0)
RBC: 3.69 10*6/uL — ABNORMAL LOW (ref 4.20–5.82)
WBC: 3.9 10*3/uL — ABNORMAL LOW (ref 4.0–10.3)
lymph#: 1.2 10*3/uL (ref 0.9–3.3)

## 2012-04-04 LAB — COMPREHENSIVE METABOLIC PANEL (CC13)
Alkaline Phosphatase: 126 U/L (ref 40–150)
BUN: 22 mg/dL (ref 7.0–26.0)
CO2: 23 mEq/L (ref 22–29)
Creatinine: 1 mg/dL (ref 0.7–1.3)
Glucose: 129 mg/dl — ABNORMAL HIGH (ref 70–99)
Sodium: 141 mEq/L (ref 136–145)
Total Bilirubin: 0.4 mg/dL (ref 0.20–1.20)
Total Protein: 7.1 g/dL (ref 6.4–8.3)

## 2012-04-04 MED ORDER — LENALIDOMIDE 10 MG PO CAPS: 10.0000 mg | ORAL_CAPSULE | Freq: Every day | ORAL | Status: DC

## 2012-04-04 NOTE — Telephone Encounter (Signed)
Spoke with pt and informed pt re:  A new rx for Revlimid 10 mg had been faxed to [facility].   Instructed pt to call office when pt received meds for further instructions from md.   Pt voiced understanding.

## 2012-04-04 NOTE — Telephone Encounter (Signed)
Faxed new rx for Revlimid 10 mg po daily  X  14  Days , followed by 1 week off  To  [facility].    Authorization   #    [id number]. Dr. Dalene Carrow reviewed lab results today.   Called pt at home and left message for pt to call nurse back with above instructions.

## 2012-04-11 ENCOUNTER — Telehealth: Payer: Self-pay | Admitting: Nurse Practitioner

## 2012-04-11 NOTE — Telephone Encounter (Signed)
Informed pt Dr. Dalene Carrow is out of the office today, however, will return tomorrow and we will call him with instructions.

## 2012-04-11 NOTE — Telephone Encounter (Signed)
Pt called to report he had received his shipment of Revlimid.  Called to inquire when he should start taking and if he needs lab appointments.

## 2012-04-12 NOTE — Telephone Encounter (Signed)
Instructed pt to begin Revlimid 10 mg today.  Take once daily for two weeks, then rest for one week.  Pt to be seen in this office on 05/02/12.  Lab and midlevel appointment.  Pt verbalized understanding.  POF sent to scheduling.

## 2012-04-12 NOTE — Telephone Encounter (Signed)
Called pt req return call- need to find out when last dose of Revlimid was taken.

## 2012-04-18 ENCOUNTER — Other Ambulatory Visit: Payer: Medicare Other | Admitting: Lab

## 2012-04-18 ENCOUNTER — Ambulatory Visit: Payer: Medicare Other | Admitting: Hematology and Oncology

## 2012-05-01 ENCOUNTER — Other Ambulatory Visit: Payer: Self-pay | Admitting: *Deleted

## 2012-05-01 DIAGNOSIS — C9000 Multiple myeloma not having achieved remission: Secondary | ICD-10-CM

## 2012-05-02 ENCOUNTER — Ambulatory Visit (HOSPITAL_BASED_OUTPATIENT_CLINIC_OR_DEPARTMENT_OTHER): Payer: Medicare Other | Admitting: Family

## 2012-05-02 ENCOUNTER — Telehealth: Payer: Self-pay | Admitting: Hematology and Oncology

## 2012-05-02 ENCOUNTER — Other Ambulatory Visit (HOSPITAL_BASED_OUTPATIENT_CLINIC_OR_DEPARTMENT_OTHER): Payer: Medicare Other

## 2012-05-02 ENCOUNTER — Encounter: Payer: Self-pay | Admitting: Family

## 2012-05-02 ENCOUNTER — Ambulatory Visit (HOSPITAL_BASED_OUTPATIENT_CLINIC_OR_DEPARTMENT_OTHER): Payer: Medicare Other

## 2012-05-02 VITALS — BP 158/70 | HR 69 | Temp 97.9°F | Resp 20 | Ht 68.0 in | Wt 200.4 lb

## 2012-05-02 DIAGNOSIS — C9000 Multiple myeloma not having achieved remission: Secondary | ICD-10-CM

## 2012-05-02 DIAGNOSIS — D539 Nutritional anemia, unspecified: Secondary | ICD-10-CM

## 2012-05-02 DIAGNOSIS — H579 Unspecified disorder of eye and adnexa: Secondary | ICD-10-CM

## 2012-05-02 DIAGNOSIS — Z23 Encounter for immunization: Secondary | ICD-10-CM

## 2012-05-02 DIAGNOSIS — R030 Elevated blood-pressure reading, without diagnosis of hypertension: Secondary | ICD-10-CM

## 2012-05-02 LAB — CBC WITH DIFFERENTIAL/PLATELET
Basophils Absolute: 0 10*3/uL (ref 0.0–0.1)
Eosinophils Absolute: 0.1 10*3/uL (ref 0.0–0.5)
HCT: 34.6 % — ABNORMAL LOW (ref 38.4–49.9)
HGB: 11.8 g/dL — ABNORMAL LOW (ref 13.0–17.1)
LYMPH%: 31.4 % (ref 14.0–49.0)
MCV: 99.6 fL — ABNORMAL HIGH (ref 79.3–98.0)
MONO#: 0.7 10*3/uL (ref 0.1–0.9)
MONO%: 18.1 % — ABNORMAL HIGH (ref 0.0–14.0)
NEUT#: 2 10*3/uL (ref 1.5–6.5)
NEUT%: 47.9 % (ref 39.0–75.0)
Platelets: 112 10*3/uL — ABNORMAL LOW (ref 140–400)
RBC: 3.48 10*6/uL — ABNORMAL LOW (ref 4.20–5.82)
WBC: 4.1 10*3/uL (ref 4.0–10.3)

## 2012-05-02 LAB — COMPREHENSIVE METABOLIC PANEL (CC13)
Alkaline Phosphatase: 111 U/L (ref 40–150)
BUN: 23 mg/dL (ref 7.0–26.0)
CO2: 22 mEq/L (ref 22–29)
Glucose: 139 mg/dl — ABNORMAL HIGH (ref 70–99)
Sodium: 141 mEq/L (ref 136–145)
Total Bilirubin: 0.3 mg/dL (ref 0.20–1.20)
Total Protein: 6.6 g/dL (ref 6.4–8.3)

## 2012-05-02 MED ORDER — INFLUENZA VIRUS VACC SPLIT PF IM SUSP
0.5000 mL | Freq: Once | INTRAMUSCULAR | Status: AC
  Administered 2012-05-02: 0.5 mL via INTRAMUSCULAR
  Filled 2012-05-02: qty 0.5

## 2012-05-02 NOTE — Patient Instructions (Signed)
Please follow-up with Dr. Lovell Sheehan today regarding your ocular (eye) concerns. Please follow up with Dr. Earl Gala today regarding your elevated blood pressure. Keep well hydrated and continue taking Imodium for your loose stools

## 2012-05-02 NOTE — Telephone Encounter (Signed)
Gave pt appt for November 2013 labs and MD

## 2012-05-02 NOTE — Progress Notes (Signed)
Patient ID: John Carter, male   DOB: 1930-05-26, 78 y.o.   MRN: [id number] CSN: [id number]  CC: [name], M.D.  Identifying Statement: John Carter is a 78 y.o. Caucasian male with a history of IgG kappa multiple myeloma who presents for follow-up.   Interval History: [name] is a 78 year-old Caucasian male with a history of IgG kappa multiple myeloma who presents for follow-up.  He is accompanied by his wife John Carter for today's office visit.  [name] was last seen in our office on 03/07/2012.   Since that time, the patient states that he has ongoing loose bowel movements. He states he has approximately 3-4 loose bowel movements daily while on Revlimid and 1-2 loose bowel movements daily when not on Revlimid.  The patient states he does receive relief when he takes Imodium. The patient was reminded to keep well hydrated.  [name] also reports ongoing fatigue and he is more fatigued when he is taking Revlimid.  [name] reports two episodes of epistaxis but also notes having a history of a deviated septum.  [name] states that he has noticed an increase in broken blood vessels in his eye lately.  Dr. Dalene Carter explained to the patient that Revlimid is not the culprit of the broken blood vessels in his eye and this may be a function of his elevated blood pressure.  [name] was encouraged to schedule an appointment with his Opthalmologist and PCP today.  The patient denies any other symptomatology today including any unusual bleeding, fever, chills, N/V or constipation, pain or SOB.  The patient remains active and independently completes his ADLs.  [name] requested and received the influenza vaccination today.   Medications: Current Outpatient Prescriptions  Medication Sig Dispense Refill  . amLODipine (NORVASC) 5 MG tablet Take 5 mg by mouth daily.        Marland Kitchen aspirin 325 MG EC tablet Take 325 mg by mouth daily.        . cetirizine-pseudoephedrine  (ZYRTEC-D) 5-120 MG per tablet Take 1 tablet by mouth daily.       . clopidogrel (PLAVIX) 75 MG tablet Take 75 mg by mouth daily.       . flunisolide (NASAREL) 29 MCG/ACT (0.025%) nasal spray Place 2 sprays into the nose at bedtime. Dose is for each nostril.       Marland Kitchen glimepiride (AMARYL) 1 MG tablet Take 1 mg by mouth daily before breakfast. Take when glucose level is above 120      . Glucosamine-Chondroit-Vit C-Mn (GLUCOSAMINE 1500 COMPLEX) CAPS Take 2 capsules by mouth daily.      Marland Kitchen lenalidomide (REVLIMID) 10 MG capsule Take 1 capsule (10 mg total) by mouth daily. Take 10 mg po daily  X  14  Days   Followed  By  1  Week  Off. Authorization  #   [id number].  14 capsule  0  . metoprolol (TOPROL-XL) 50 MG 24 hr tablet Take 50 mg by mouth daily.        . Multiple Vitamins-Minerals (MULTIVITAMINS THER. W/MINERALS) TABS Take 1 tablet by mouth daily. Centrum silver      . nitroGLYCERIN (NITROSTAT) 0.4 MG SL tablet Place 0.4 mg under the tongue every 5 (five) minutes as needed. For chest pain       . omeprazole (PRILOSEC) 20 MG capsule Take 20 mg by mouth daily.        . simvastatin (ZOCOR) 20 MG tablet Take 20 mg by mouth  at bedtime.        . valsartan (DIOVAN) 320 MG tablet Take 320 mg by mouth daily.       . Vitamin D, Ergocalciferol, (DRISDOL) 50000 UNITS CAPS Take 50,000 Units by mouth every 14 (fourteen) days.        Current Facility-Administered Medications  Medication Dose Route Frequency Provider Last Rate Last Dose  . influenza  inactive virus vaccine (FLUZONE/FLUARIX) injection 0.5 mL  0.5 mL Intramuscular Once Keitha Butte, NP   0.5 mL at 05/02/12 1205    Allergies: Allergies  Allergen Reactions  . Sulfa Drugs Cross Reactors Rash    Past Medical History: Past Medical History  Diagnosis Date  . Angina   . Stroke   . Multiple myeloma 06/2009    was on chemo  . Melanoma   . Hypertension     amlodipine and metoprolol  . Hyperlipidemia     takes Zocor daily  . Coronary  artery disease     2 stents placed in 2001  . Myocardial infarction 2001  . Sleep apnea     sleep study done 2yrs ago and pt does use a CPAP;setting of 10  . TIA (transient ischemic attack) 2007  . Arthritis     left knee  . Chronic back pain     compression fracture,degenerative disc disease  . Bruises easily     pt is on Plavix and Aspirin  . GERD (gastroesophageal reflux disease)     takes Omeprazole daily  . Hiatal hernia   . Gastric ulcer   . Diverticulosis   . Hemorrhoids   . History of colonic polyps   . Urinary frequency   . Nocturia   . Leg cramps   . Cataracts, bilateral   . Hiatal hernia      Family History: Family History  Problem Relation Age of Onset  . Anesthesia problems Neg Hx   . Hypotension Neg Hx   . Malignant hyperthermia Neg Hx   . Pseudochol deficiency Neg Hx   . Heart attack Mother   . Heart attack Father   . Diabetes Father   . Cancer Sister   . Heart attack Brother   . Diabetes Brother     Social History: History  Substance Use Topics  . Smoking status: Never Smoker   . Smokeless tobacco: Never Used  . Alcohol Use: No    Review of Systems: 10 point review of systems was completed and is negative except as noted above.   Physical Exam: Blood pressure 158/70, pulse 69, temperature 97.9 F (36.6 C), temperature source Oral, resp. rate 20, height 5\' 8"  (1.727 m), weight 200 lb 6.4 oz (90.901 kg).  General appearance: Alert, cooperative, well nourished, no apparent distress Head: Normocephalic, without obvious abnormality, atraumatic Eyes: Conjunctivae injected, corneas clear, PERRLA, EOMI Nose: Nares and mucosa are normal, no drainage or sinus tenderness Neck: No adenopathy, supple, symmetrical, trachea midline, thyroid not enlarged, no tenderness Resp: Clear to auscultation bilaterally, diminished bibasilar breath sounds Cardio: Regular rate and rhythm, S1, S2 normal, no murmur, click, rub or gallop GI: Soft, non-tender,  distended, normoactive bowel sounds,  no organomegaly Extremities: Extremities normal, atraumatic, no cyanosis or edema, large well healed surgical scar on left knee Lymph nodes: Cervical, supraclavicular, and axillary nodes normal Neurologic: Grossly normal   Laboratory Data: Results for orders placed in visit on 05/02/12 (from the past 48 hour(s))  CBC WITH DIFFERENTIAL     Status: Abnormal   Collection Time  05/02/12 10:28 AM      Component Value Range Comment   WBC 4.1  4.0 - 10.3 10e3/uL    NEUT# 2.0  1.5 - 6.5 10e3/uL    HGB 11.8 (*) 13.0 - 17.1 g/dL    HCT 16.1 (*) 09.6 - 49.9 %    Platelets 112 (*) 140 - 400 10e3/uL    MCV 99.6 (*) 79.3 - 98.0 fL    MCH 33.8 (*) 27.2 - 33.4 pg    MCHC 33.9  32.0 - 36.0 g/dL    RBC 0.45 (*) 4.09 - 5.82 10e6/uL    RDW 15.6 (*) 11.0 - 14.6 %    lymph# 1.3  0.9 - 3.3 10e3/uL    MONO# 0.7  0.1 - 0.9 10e3/uL    Eosinophils Absolute 0.1  0.0 - 0.5 10e3/uL    Basophils Absolute 0.0  0.0 - 0.1 10e3/uL    NEUT% 47.9  39.0 - 75.0 %    LYMPH% 31.4  14.0 - 49.0 %    MONO% 18.1 (*) 0.0 - 14.0 %    EOS% 1.8  0.0 - 7.0 %    BASO% 0.8  0.0 - 2.0 %   COMPREHENSIVE METABOLIC PANEL (CC13)     Status: Abnormal   Collection Time   05/02/12 10:28 AM      Component Value Range Comment   Sodium 141  136 - 145 mEq/L    Potassium 4.2  3.5 - 5.1 mEq/L    Chloride 110 (*) 98 - 107 mEq/L    CO2 22  22 - 29 mEq/L    Glucose 139 (*) 70 - 99 mg/dl    BUN 81.1  7.0 - 91.4 mg/dL    Creatinine 1.0  0.7 - 1.3 mg/dL    Total Bilirubin 7.82  0.20 - 1.20 mg/dL    Alkaline Phosphatase 111  40 - 150 U/L    AST 15  5 - 34 U/L    ALT 20  0 - 55 U/L    Total Protein 6.6  6.4 - 8.3 g/dL    Albumin 3.5  3.5 - 5.0 g/dL    Calcium 9.0  8.4 - 95.6 mg/dL     Impression/Plan: Mr. Sheaffer is an 78 year-old male with IgG kappa multiple myeloma, status post bone marrow biopsy revealing 48% plasma cells, diagnosed on 10/21/2009.   He is status post 9 months of melphalan,  prednisone, and subcu Velcade from April 2011 through March 2012. Near complete response to therapy. Biochemical progression without renal involvement for hypercalcemia, thus was initiated on Revlimid with weekly Decadron on August 19, 2011. Following 6 cycles, had a good response and his IgG levels had normalized.  Mr. Harjo is now on maintenance Revlimid 10 mg PO daily,  2 weeks on, 1 week off basis. He will return to have laboratories drawn on 05/23/2012 prior starting cycle 13 of Revlimid on 05/24/2012.   He will again have laboratories drawn on 06/07/2012 prior to starting cycle 14 of Revlimid on 06/14/2012.  [name] has a follow-up office visit with Dr. Dalene Carter on 06/13/2012 with laboratories, including myeloma markers, to be drawn in advance of the office visit.   The patient and his wife are encourage to contact us in the interim if they have any questions or concerns.   Once again, Mr. Thetford is asked to contact Dr. Earl Gala today regarding his elevated blood pressure and Dr. Lovell Sheehan regarding the broken blood vessels in his eyes.    Tariana Moldovan,  Eraina Winnie, NP-C 05/02/2012, 10:43 AM

## 2012-05-09 ENCOUNTER — Telehealth: Payer: Self-pay | Admitting: *Deleted

## 2012-05-09 NOTE — Telephone Encounter (Signed)
Called pt back and informed pt that pt can take Zyrtec, or Claritin,  And  Mucinex  OTC to help with the cold  As per Dr. Mamie Levers instructions.   Instructed pt to call office back if pt develops  Fever, or productive cough.  Instructed pt to push po fluids as tolerated.   Per Dr. Arline Asp, pt can continue with Revlimid as scheduled.   Pt voiced understanding.

## 2012-05-09 NOTE — Telephone Encounter (Signed)
Received message from pt stating that pt developed sore throat yesterday, and today, pt has watery eyes, and runny nose.   Spoke with pt and was informed that pt  Has  NO  Temp,  Just congestion.   Pt has been taking Tylenol to help with congestion.   Pt is in the middle cycle of Revlimid 10 mg daily  X 14 days -  Started  Wed  05/03/12. Pt wanted to know if anything else OTC that pt could take to help with the cold. Dr. Arline Asp notified. Pt's   Phone    [phone].

## 2012-05-12 ENCOUNTER — Other Ambulatory Visit: Payer: Self-pay | Admitting: *Deleted

## 2012-05-12 DIAGNOSIS — C9000 Multiple myeloma not having achieved remission: Secondary | ICD-10-CM

## 2012-05-12 NOTE — Telephone Encounter (Signed)
THIS REFILL REQUEST FOR REVLIMID WAS PLACED IN DR.ODOGWU'S ACTIVE WORK BOX. 

## 2012-05-16 MED ORDER — LENALIDOMIDE 10 MG PO CAPS: ORAL_CAPSULE | ORAL | Status: DC

## 2012-05-16 NOTE — Addendum Note (Signed)
Addended by: Arvilla Meres on: 05/16/2012 03:05 PM   Modules accepted: Orders

## 2012-05-23 ENCOUNTER — Other Ambulatory Visit (HOSPITAL_BASED_OUTPATIENT_CLINIC_OR_DEPARTMENT_OTHER): Payer: Medicare Other | Admitting: Lab

## 2012-05-23 ENCOUNTER — Telehealth: Payer: Self-pay | Admitting: *Deleted

## 2012-05-23 DIAGNOSIS — C9000 Multiple myeloma not having achieved remission: Secondary | ICD-10-CM

## 2012-05-23 LAB — CBC WITH DIFFERENTIAL/PLATELET
Basophils Absolute: 0 10*3/uL (ref 0.0–0.1)
Eosinophils Absolute: 0.1 10*3/uL (ref 0.0–0.5)
HCT: 31.6 % — ABNORMAL LOW (ref 38.4–49.9)
HGB: 10.7 g/dL — ABNORMAL LOW (ref 13.0–17.1)
MCV: 99.7 fL — ABNORMAL HIGH (ref 79.3–98.0)
MONO%: 21.6 % — ABNORMAL HIGH (ref 0.0–14.0)
NEUT#: 4 10*3/uL (ref 1.5–6.5)
NEUT%: 56.1 % (ref 39.0–75.0)
Platelets: 156 10*3/uL (ref 140–400)
RDW: 15 % — ABNORMAL HIGH (ref 11.0–14.6)

## 2012-05-23 NOTE — Telephone Encounter (Signed)
Spoke with pt and informed pt re:  OK to start  Revlimid 10 mg po daily x  14 days on  05/24/12  As per md's instructions.    Confirmed with pt date and time for f/u with md on 06/13/12.   Pt voiced understanding.

## 2012-05-26 ENCOUNTER — Telehealth: Payer: Self-pay | Admitting: *Deleted

## 2012-05-26 NOTE — Telephone Encounter (Signed)
Received message from pt wanting to let md know that pt still has a cold , and has diarrhea.   Spoke with pt and was informed re:  Pt has had a cold for about 3 weeks now.  Pt saw Dr. Jenne Pane on Monday and was told that pt has fluid in his ear.   Pt stated he has had diarrhea with the cold, but more now.   Pt stated he had  7 episodes of diarrhea since midnight .   Asked how pt took Imodium.   Pt stated he took 1 tab last night , and 1 tab this am.   Pt stated he has been drinking lots of water.  Pt is still in his cycle of Revlimid 10 mg daily. Instructed pt on correct way to take Imodium ;  Instructed pt to take another Imodium tabl right after speaking with nurse.   Instructed pt to eat BRAT  Diet,  Drink Gatorade, eat soup  - to replenish electrolytes instead of just water.   Instructed pt to call nurse back in 2 hours to give nurse update on his progress.    Pt voiced understanding.

## 2012-05-26 NOTE — Telephone Encounter (Signed)
Spoke with pt at home and instructed pt to STOP Revlimid as per md's instructions.   Pt stated he already took a dose today.   Instructed pt to HOLD Revlimid starting  05/27/12  Until pt sees Dr. Dalene Carrow again on 06/13/12.   Reinforced correct use of Imodium ,  Eat  BRAT diet, and increased po fluids as tolerated.   Pt voiced understanding.

## 2012-06-07 ENCOUNTER — Other Ambulatory Visit (HOSPITAL_BASED_OUTPATIENT_CLINIC_OR_DEPARTMENT_OTHER): Payer: Medicare Other

## 2012-06-07 DIAGNOSIS — D649 Anemia, unspecified: Secondary | ICD-10-CM

## 2012-06-07 DIAGNOSIS — C9000 Multiple myeloma not having achieved remission: Secondary | ICD-10-CM

## 2012-06-07 LAB — CBC WITH DIFFERENTIAL/PLATELET
Basophils Absolute: 0.1 10*3/uL (ref 0.0–0.1)
Eosinophils Absolute: 0.1 10*3/uL (ref 0.0–0.5)
LYMPH%: 27.1 % (ref 14.0–49.0)
MCV: 98.6 fL — ABNORMAL HIGH (ref 79.3–98.0)
MONO%: 17.6 % — ABNORMAL HIGH (ref 0.0–14.0)
NEUT#: 3.3 10*3/uL (ref 1.5–6.5)
Platelets: 134 10*3/uL — ABNORMAL LOW (ref 140–400)
RBC: 3.56 10*6/uL — ABNORMAL LOW (ref 4.20–5.82)

## 2012-06-07 LAB — COMPREHENSIVE METABOLIC PANEL (CC13)
Alkaline Phosphatase: 118 U/L (ref 40–150)
BUN: 26 mg/dL (ref 7.0–26.0)
Glucose: 189 mg/dl — ABNORMAL HIGH (ref 70–99)
Sodium: 138 mEq/L (ref 136–145)
Total Bilirubin: 0.3 mg/dL (ref 0.20–1.20)

## 2012-06-08 ENCOUNTER — Other Ambulatory Visit: Payer: Self-pay | Admitting: *Deleted

## 2012-06-08 DIAGNOSIS — C9000 Multiple myeloma not having achieved remission: Secondary | ICD-10-CM

## 2012-06-08 MED ORDER — LENALIDOMIDE 10 MG PO CAPS: ORAL_CAPSULE | ORAL | Status: DC

## 2012-06-08 NOTE — Telephone Encounter (Signed)
THIS REFILL REQUEST FOR REVLIMID WAS PLACED IN DR.ODOGWU'S ACTIVE WORK BOX.

## 2012-06-13 ENCOUNTER — Other Ambulatory Visit: Payer: Self-pay | Admitting: Hematology and Oncology

## 2012-06-13 ENCOUNTER — Encounter: Payer: Self-pay | Admitting: Hematology and Oncology

## 2012-06-13 ENCOUNTER — Telehealth: Payer: Self-pay | Admitting: Hematology and Oncology

## 2012-06-13 ENCOUNTER — Ambulatory Visit (HOSPITAL_BASED_OUTPATIENT_CLINIC_OR_DEPARTMENT_OTHER): Payer: Medicare Other | Admitting: Hematology and Oncology

## 2012-06-13 ENCOUNTER — Telehealth: Payer: Self-pay | Admitting: Nurse Practitioner

## 2012-06-13 VITALS — BP 165/79 | HR 88 | Temp 96.8°F | Resp 20 | Ht 68.0 in | Wt 199.9 lb

## 2012-06-13 DIAGNOSIS — C9000 Multiple myeloma not having achieved remission: Secondary | ICD-10-CM

## 2012-06-13 DIAGNOSIS — C439 Malignant melanoma of skin, unspecified: Secondary | ICD-10-CM

## 2012-06-13 LAB — SPEP & IFE WITH QIG
Albumin ELP: 52.5 % — ABNORMAL LOW (ref 55.8–66.1)
IgA: 235 mg/dL (ref 68–379)
IgM, Serum: 48 mg/dL (ref 41–251)
Total Protein, Serum Electrophoresis: 7.1 g/dL (ref 6.0–8.3)

## 2012-06-13 MED ORDER — LENALIDOMIDE 10 MG PO CAPS: ORAL_CAPSULE | ORAL | Status: DC

## 2012-06-13 NOTE — Telephone Encounter (Signed)
Received call from patient.  States he is unsure what day he is to start his Revlimid.  Information to Dr. Dalene Carrow for date.

## 2012-06-13 NOTE — Progress Notes (Signed)
This office note has been dictated.

## 2012-06-13 NOTE — Telephone Encounter (Signed)
Instructed pt that he is to wait to start Revlimid until after 12/3 lab results.  Pt is to call on 12/4 to inquire if he does not hear from this office with instructions.

## 2012-06-13 NOTE — Progress Notes (Signed)
CC:   [name], M.D.       Marina Gravel, M.D.   IDENTIFYING STATEMENT:  The patient is an 78 year old man with multiple myeloma who presents for followup.  INTERVAL HISTORY:  He reported an upper respiratory tract type infection associated with ear discomfort during his 3rd during his last cycle.  He also complained of ongoing GI upset with abdominal discomfort and diarrhea describing on average 4 loose stools daily.  This is getting better, however, he still complained of some abdominal discomfort and early satiety type symptoms.  He was asked to hold Revlimid at last cycle.  His energy levels are fair.  He is afebrile.  Denies mucositis. Denies fever and chills.  MEDICATIONS:  Revlimid 10 mg for 2 weeks followed by a week's rest. Rest of medicines reviewed and updated.  PAST MEDICAL HISTORY/FAMILY HISTORY/SOCIAL HISTORY:  Unchanged.  REVIEW OF SYSTEMS:  Ten point review of systems negative.  PHYSICAL EXAM:  General:  The patient is a well-appearing, well- nourished man in no distress.  Vitals:  Pulse 88, blood pressure 165/79, temperature 96.8, respirations 20, weight 199 pounds.  HEENT:  Head is atraumatic, normocephalic.  Sclerae anicteric.  Mouth moist.  Chest/CVS: Unremarkable.  Abdomen:  Soft, nontender.  No masses.  Bowel sounds present.  Extremities:  No hand-foot syndrome or calf tenderness.  LAB DATA:  On 06/07/2012 white cell count 6.4, hemoglobin 11.5, hematocrit 35.1, platelets 134.  Sodium 138, potassium 4.3, chloride 107, CO2 24, BUN 26, creatinine 1, glucose 189.  T bilirubin 0.3, alkaline phosphatase 118, AST 17, ALT 20.  Myeloma markers pending.  IMPRESSION AND PLAN:  Mr. Brisendine is an 78 year old man with: 1. IgG kappa multiple myeloma status post bone marrow biopsy revealing     48% plasma cells diagnosed on 10/21/2009, status post 9 months of     melphalan, prednisone and subcu Velcade from April 2011 through     March 2012 with near complete response  to therapy. 2. Biochemical progression without renal involvement or hypercalcemia     and thus we initiated on Revlimid with weekly Decadron on     08/19/2011.  The patient had a good response to IgG levels which     had normalized.  He is presently on maintenance Revlimid dosed at     10 mg on 2 weeks on 1 week off basis.  Therapy was held with last     cycle for GI discomfort.  The patient complains of early satiety     associated with GI discomfort.  We will have him obtain a CT scan     of the abdomen.  Revlimid will remain on hold until we receive     results.  If all is he resumes treatment on 06/21/2013.     He is rescheduled to follow up on 08/01/2012.    ______________________________ Laurice Record, M.D. LIO/MEDQ  D:  06/13/2012  T:  06/13/2012  Job:  [id number]

## 2012-06-13 NOTE — Telephone Encounter (Signed)
appts made and printed for pt,pt given contrast and advised that he will get a call from cen/sch with the appt

## 2012-06-13 NOTE — Addendum Note (Signed)
Addended by: Barbara Cower on: 06/13/2012 12:06 PM   Modules accepted: Orders

## 2012-06-13 NOTE — Patient Instructions (Addendum)
John Carter  [id number]   [facility] - AFTER VISIT SUMMARY   **RECOMMENDATIONS MADE BY THE CONSULTANT AND ANY TEST    RESULTS WILL BE SENT TO YOUR REFERRING DOCTORS.   YOUR EXAM FINDINGS, LABS AND RESULTS WERE DISCUSSED BY YOUR MD TODAY.  YOU CAN GO TO THE [place] WEB SITE FOR INSTRUCTIONS ON HOW TO ASSESS MY CHART FOR ADDITIONAL INFORMATION AS NEEDED.  Your Updated drug allergies are: Allergies as of 06/13/2012 - Review Complete 05/02/2012  Allergen Reaction Noted  . Sulfa drugs cross reactors Rash 06/14/2011    Your current list of medications are: Current Outpatient Prescriptions  Medication Sig Dispense Refill  . amLODipine (NORVASC) 5 MG tablet Take 5 mg by mouth daily.        Marland Kitchen aspirin 325 MG EC tablet Take 325 mg by mouth daily.        . cetirizine-pseudoephedrine (ZYRTEC-D) 5-120 MG per tablet Take 1 tablet by mouth daily.       . clopidogrel (PLAVIX) 75 MG tablet Take 75 mg by mouth daily.       . flunisolide (NASAREL) 29 MCG/ACT (0.025%) nasal spray Place 2 sprays into the nose at bedtime. Dose is for each nostril.       Marland Kitchen glimepiride (AMARYL) 1 MG tablet Take 1 mg by mouth daily before breakfast. Take when glucose level is above 120      . Glucosamine-Chondroit-Vit C-Mn (GLUCOSAMINE 1500 COMPLEX) CAPS Take 2 capsules by mouth daily.      Marland Kitchen lenalidomide (REVLIMID) 10 MG capsule Take 10 mg po daily  X  14  Days   Followed  By  1  Week  O  14 capsule  0  . metoprolol (TOPROL-XL) 50 MG 24 hr tablet Take 50 mg by mouth daily.        . Multiple Vitamins-Minerals (MULTIVITAMINS THER. W/MINERALS) TABS Take 1 tablet by mouth daily. Centrum silver      . nitroGLYCERIN (NITROSTAT) 0.4 MG SL tablet Place 0.4 mg under the tongue every 5 (five) minutes as needed. For chest pain       . omeprazole (PRILOSEC) 20 MG capsule Take 20 mg by mouth daily.        . simvastatin (ZOCOR) 20 MG tablet Take 20 mg by mouth at bedtime.        . valsartan (DIOVAN) 320 MG  tablet Take 320 mg by mouth daily.       . Vitamin D, Ergocalciferol, (DRISDOL) 50000 UNITS CAPS Take 50,000 Units by mouth every 14 (fourteen) days.          INSTRUCTIONS GIVEN AND DISCUSSED:  See attached schedule   SPECIAL INSTRUCTIONS/FOLLOW-UP:  See above.  I acknowledge that I have been informed and understand all the instructions given to me and received a copy.I know to contact the clinic, my physician, or go to the emergency Department if any problems should occur.   I do not have any more questions at this time, but understand that I may call the [facility] at [phone] during business hours should I have any further questions or need assistance in obtaining follow-up care.

## 2012-06-20 ENCOUNTER — Telehealth: Payer: Self-pay | Admitting: *Deleted

## 2012-06-20 ENCOUNTER — Ambulatory Visit (HOSPITAL_COMMUNITY)
Admission: RE | Admit: 2012-06-20 | Discharge: 2012-06-20 | Disposition: A | Discharge status: Home / Self Care | Payer: Medicare Other | Source: Ambulatory Visit | Attending: Hematology and Oncology | Admitting: Hematology and Oncology

## 2012-06-20 ENCOUNTER — Other Ambulatory Visit (HOSPITAL_BASED_OUTPATIENT_CLINIC_OR_DEPARTMENT_OTHER): Payer: Medicare Other

## 2012-06-20 ENCOUNTER — Other Ambulatory Visit: Payer: Self-pay | Admitting: *Deleted

## 2012-06-20 DIAGNOSIS — K4020 Bilateral inguinal hernia, without obstruction or gangrene, not specified as recurrent: Secondary | ICD-10-CM | POA: Insufficient documentation

## 2012-06-20 DIAGNOSIS — C439 Malignant melanoma of skin, unspecified: Secondary | ICD-10-CM | POA: Insufficient documentation

## 2012-06-20 DIAGNOSIS — Z85820 Personal history of malignant melanoma of skin: Secondary | ICD-10-CM | POA: Insufficient documentation

## 2012-06-20 DIAGNOSIS — I7090 Unspecified atherosclerosis: Secondary | ICD-10-CM | POA: Insufficient documentation

## 2012-06-20 DIAGNOSIS — C9000 Multiple myeloma not having achieved remission: Secondary | ICD-10-CM

## 2012-06-20 DIAGNOSIS — K5730 Diverticulosis of large intestine without perforation or abscess without bleeding: Secondary | ICD-10-CM | POA: Insufficient documentation

## 2012-06-20 DIAGNOSIS — K7689 Other specified diseases of liver: Secondary | ICD-10-CM | POA: Insufficient documentation

## 2012-06-20 LAB — CBC WITH DIFFERENTIAL/PLATELET
BASO%: 0.6 % (ref 0.0–2.0)
Basophils Absolute: 0 10*3/uL (ref 0.0–0.1)
EOS%: 2.1 % (ref 0.0–7.0)
HGB: 12.4 g/dL — ABNORMAL LOW (ref 13.0–17.1)
MCH: 34 pg — ABNORMAL HIGH (ref 27.2–33.4)
MCHC: 33.7 g/dL (ref 32.0–36.0)
MONO%: 12.7 % (ref 0.0–14.0)
RBC: 3.63 10*6/uL — ABNORMAL LOW (ref 4.20–5.82)
RDW: 15.3 % — ABNORMAL HIGH (ref 11.0–14.6)
lymph#: 1.5 10*3/uL (ref 0.9–3.3)

## 2012-06-20 LAB — COMPREHENSIVE METABOLIC PANEL (CC13)
ALT: 20 U/L (ref 0–55)
AST: 17 U/L (ref 5–34)
Albumin: 3.5 g/dL (ref 3.5–5.0)
Alkaline Phosphatase: 143 U/L (ref 40–150)
Calcium: 8.9 mg/dL (ref 8.4–10.4)
Chloride: 108 mEq/L — ABNORMAL HIGH (ref 98–107)
Potassium: 4.2 mEq/L (ref 3.5–5.1)
Sodium: 141 mEq/L (ref 136–145)
Total Protein: 7 g/dL (ref 6.4–8.3)

## 2012-06-20 MED ORDER — IOHEXOL 300 MG/ML  SOLN
100.0000 mL | Freq: Once | INTRAMUSCULAR | Status: AC | PRN
  Administered 2012-06-20: 100 mL via INTRAVENOUS

## 2012-06-20 NOTE — Telephone Encounter (Signed)
Spoke with pt and instructed pt re:  May begin Revlimid 10 mg po daily starting today as per md's instructions.

## 2012-06-20 NOTE — Telephone Encounter (Signed)
Spoke with pt and gave pt instructions re:  CT A/P is scheduled for 5 pm today 06/20/12.   Instructed pt to be NPO starting now 110 pm.    Instructed pt to drink contrast starting 3 pm today.   Pt understood not to restart Revlimid until nurse calls pt. Spoke with Jon Gills, specialty pharmacist at [facility] pharmacy and informed her to send pt a new cycle of Revlimid 10 mg daily x 14 days. Debbie's  Phone    [phone].

## 2012-06-21 ENCOUNTER — Telehealth: Payer: Self-pay | Admitting: *Deleted

## 2012-06-21 ENCOUNTER — Telehealth: Payer: Self-pay | Admitting: Hematology and Oncology

## 2012-06-21 NOTE — Telephone Encounter (Signed)
Returned pt's call re 12/20 lab appt. Per wife pt not home but she will have him call me back. Also pt cx'd 12/20 ct appt. Message to desk nurse re pt cancelling appt.

## 2012-06-21 NOTE — Telephone Encounter (Signed)
Spoke with pt and informed him re:  CT A/P  Showed no acute findings.  Confirmed with pt that he resumed  Revlimid  10 mg po daily starting 06/20/12  X  14  Days.

## 2012-07-03 ENCOUNTER — Other Ambulatory Visit: Payer: Self-pay | Admitting: *Deleted

## 2012-07-03 NOTE — Telephone Encounter (Signed)
THIS REFILL REQUEST FOR REVLIMID WAS PLACED IN DR.ODOGWU'S ACTIVE WORK BIN.

## 2012-07-04 ENCOUNTER — Other Ambulatory Visit: Payer: Self-pay | Admitting: Nurse Practitioner

## 2012-07-04 DIAGNOSIS — C9000 Multiple myeloma not having achieved remission: Secondary | ICD-10-CM

## 2012-07-04 MED ORDER — LENALIDOMIDE 10 MG PO CAPS: ORAL_CAPSULE | ORAL | Status: DC

## 2012-07-07 ENCOUNTER — Ambulatory Visit (HOSPITAL_COMMUNITY): Payer: Medicare Other

## 2012-07-07 ENCOUNTER — Other Ambulatory Visit (HOSPITAL_BASED_OUTPATIENT_CLINIC_OR_DEPARTMENT_OTHER): Payer: Medicare Other

## 2012-07-07 DIAGNOSIS — C9000 Multiple myeloma not having achieved remission: Secondary | ICD-10-CM

## 2012-07-07 LAB — CBC WITH DIFFERENTIAL/PLATELET
BASO%: 0.1 % (ref 0.0–2.0)
Basophils Absolute: 0 10*3/uL (ref 0.0–0.1)
EOS%: 0.7 % (ref 0.0–7.0)
MCH: 33.4 pg (ref 27.2–33.4)
MCHC: 33.2 g/dL (ref 32.0–36.0)
MCV: 100.5 fL — ABNORMAL HIGH (ref 79.3–98.0)
MONO%: 13.8 % (ref 0.0–14.0)
RDW: 15.3 % — ABNORMAL HIGH (ref 11.0–14.6)
lymph#: 1.2 10*3/uL (ref 0.9–3.3)

## 2012-07-07 LAB — BASIC METABOLIC PANEL (CC13)
BUN: 23 mg/dL (ref 7.0–26.0)
Chloride: 107 mEq/L (ref 98–107)
Creatinine: 1 mg/dL (ref 0.7–1.3)
Potassium: 3.9 mEq/L (ref 3.5–5.1)

## 2012-07-08 ENCOUNTER — Telehealth: Payer: Self-pay | Admitting: Oncology

## 2012-07-08 NOTE — Telephone Encounter (Signed)
s/w pt and he is aware of his new appt with jackie/murinson and that dr lo will no longer be with [place]      anne

## 2012-07-20 ENCOUNTER — Encounter: Payer: Self-pay | Admitting: Oncology

## 2012-07-21 ENCOUNTER — Telehealth: Payer: Self-pay | Admitting: *Deleted

## 2012-07-21 NOTE — Telephone Encounter (Signed)
Received approval notification from [employer] that pt has been enrolled in the [employer] Program for the calendar year of 2014.  Gave copy of approval to [place], [place].

## 2012-07-22 ENCOUNTER — Encounter: Payer: Self-pay | Admitting: Oncology

## 2012-07-26 ENCOUNTER — Other Ambulatory Visit: Payer: Self-pay | Admitting: *Deleted

## 2012-07-26 NOTE — Telephone Encounter (Signed)
Refill request forwarded to MD desk for review. 1st appointment with Dr. Robley Fries is 08/01/12. Left VM for patient to call back to let us know where he is in his Revlimid cycle.

## 2012-07-27 ENCOUNTER — Telehealth: Payer: Self-pay | Admitting: Medical Oncology

## 2012-07-27 DIAGNOSIS — C9000 Multiple myeloma not having achieved remission: Secondary | ICD-10-CM

## 2012-07-27 MED ORDER — LENALIDOMIDE 10 MG PO CAPS: ORAL_CAPSULE | ORAL | Status: DC

## 2012-07-27 NOTE — Telephone Encounter (Signed)
Pt called and left a message that he is on his 3rd day of his week off. He will need to restart his revlimid January 14 th. We had left pt a message yesterday about his cycle due refill of revlimid. Pt is a former Dr. Dalene Carrow pt.

## 2012-07-27 NOTE — Addendum Note (Signed)
Addended by: Arvilla Meres on: 07/27/2012 01:01 PM   Modules accepted: Orders

## 2012-08-01 ENCOUNTER — Telehealth: Payer: Self-pay | Admitting: Oncology

## 2012-08-01 ENCOUNTER — Other Ambulatory Visit: Payer: Medicare Other | Admitting: Lab

## 2012-08-01 ENCOUNTER — Other Ambulatory Visit (HOSPITAL_BASED_OUTPATIENT_CLINIC_OR_DEPARTMENT_OTHER): Payer: Medicare Other

## 2012-08-01 ENCOUNTER — Ambulatory Visit: Payer: Medicare Other | Admitting: Hematology and Oncology

## 2012-08-01 ENCOUNTER — Ambulatory Visit (HOSPITAL_BASED_OUTPATIENT_CLINIC_OR_DEPARTMENT_OTHER): Payer: Medicare Other | Admitting: Family

## 2012-08-01 ENCOUNTER — Encounter: Payer: Self-pay | Admitting: Family

## 2012-08-01 VITALS — BP 148/60 | HR 72 | Temp 97.8°F | Resp 18 | Ht 68.0 in | Wt 199.7 lb

## 2012-08-01 DIAGNOSIS — C9000 Multiple myeloma not having achieved remission: Secondary | ICD-10-CM

## 2012-08-01 LAB — CBC WITH DIFFERENTIAL/PLATELET
Eosinophils Absolute: 0.1 10*3/uL (ref 0.0–0.5)
LYMPH%: 26.6 % (ref 14.0–49.0)
MCHC: 34 g/dL (ref 32.0–36.0)
MCV: 97.8 fL (ref 79.3–98.0)
MONO%: 23.7 % — ABNORMAL HIGH (ref 0.0–14.0)
NEUT#: 2.9 10*3/uL (ref 1.5–6.5)
Platelets: 91 10*3/uL — ABNORMAL LOW (ref 140–400)
RBC: 3.63 10*6/uL — ABNORMAL LOW (ref 4.20–5.82)

## 2012-08-01 LAB — BASIC METABOLIC PANEL (CC13)
Calcium: 9.1 mg/dL (ref 8.4–10.4)
Creatinine: 1.1 mg/dL (ref 0.7–1.3)
Glucose: 131 mg/dl — ABNORMAL HIGH (ref 70–99)
Sodium: 141 mEq/L (ref 136–145)

## 2012-08-01 NOTE — Patient Instructions (Addendum)
Please contact us at [phone] if you have any questions or concerns. 

## 2012-08-01 NOTE — Progress Notes (Signed)
Patient ID: John Carter, male   DOB: 05-03-1930, 79 y.o.   MRN: [id number] CSN: [id number]  CC: John Carter, M.D.  John Carter, M.D.   Problem List: John Carter is a 79 y.o. male with a problem list consisting of:  1. IgG kappa multiple myeloma status post bone marrow biopsy on 09/19/2009 revealing 48% plasma cells.  He is status post 9 months of Melphalan, Prednisone and subcutaneous Velcade from April 2011 through March 2012 with near complete response to therapy. Biochemical progression without renal involvement or hypercalcemia and Revlimid with weekly Decadron was initiated on 08/19/2011. The patient had a good response to IgG levels which had normalized. He is presently on maintenance Revlimid dosed at 10 mg PO daily for 2 weeks on/1 week off basis. 2. Myocardial infarction with cardiac catheterization and stent placement. 3. Hypertension 4. Renal insufficiency 5. Diverticulitis without diverticulosis 6. GERD 7. Chronic back pain 8. Sleep apnea with CPAP 9. TIA 10. Melanoma 11. History of colonic polyps  Dr. Arline Asp and I saw John. John Carter, an 79 year-old Caucasian male with a history of IgG kappa multiple myeloma who presents for follow-up. He is accompanied by his wife John Carter for today's office visit.  John Carter care is transitioning from John Carter to Dr. Arline Asp today.  John Carter was last seen by John Carter on 06/13/2012. Since that time, the patient states that he has ongoing loose bowel movements. He states he has approximately 3-4 loose bowel movements daily while on Revlimid and 1-2 loose bowel movements daily when not on Revlimid. The patient states he does receive relief when he takes Imodium. The patient was reminded to keep well hydrated. John Carter also reports ongoing fatigue and he is more fatigued when he is taking Revlimid.  The broken blood vessels in John Carter left eye persists and he states that he has been seen by his optometrist.   John Carter quotes his Optometrist as saying the broken blood vessels will clear up.  John Carter has been told by John Carter previously that Revlimid is not the culprit of the broken blood vessels in his eye and this may be a function of his elevated blood pressure. John Carter was complaining of intermittent abdominal pain during his last office visit and a subsequent CT of the abdomen and pelvis with contrast was obtained.  The results of the CT were explained to the patient and his wife and a copy of the report was provided to them.   The patient denies any other symptomatology today.   John Carter insurance through the company he retired from will be discontinued in March 2014.  He is in the process of securing additional insurance.  He has concerns that his Revlimid may not be covered with the new insurance company.  Explained to him to ask his new insurance company about coverage for Revlimid once he obtains a [employer] and if it is not covered, to let us know so that we could discuss possible coverage for this medication on his behalf. John Carter agreed to do so.    Past Medical History: Past Medical History  Diagnosis Date  . Angina   . Stroke   . Multiple myeloma(203.0) 06/2009    was on chemo  . Melanoma   . Hypertension     amlodipine and metoprolol  . Hyperlipidemia     takes Zocor daily  . Coronary artery disease     2 stents placed in 2001  .  Myocardial infarction 2001  . Sleep apnea     sleep study done 78yrs ago and pt does use a CPAP;setting of 10  . TIA (transient ischemic attack) 2007  . Arthritis     left knee  . Chronic back pain     compression fracture,degenerative disc disease  . Bruises easily     pt is on Plavix and Aspirin  . GERD (gastroesophageal reflux disease)     takes Omeprazole daily  . Hiatal hernia   . Gastric ulcer   . Diverticulosis   . Hemorrhoids   . History of colonic polyps   . Urinary frequency   . Nocturia   . Leg  cramps   . Cataracts, bilateral   . Hiatal hernia     Surgical History: Past Surgical History  Procedure Date  . Back surgery 80's  . Tonsillectomy     as a  child  . Colonoscopy   . Appendectomy     as a child  . Knee surgery 1997    left knee arthroscopy  . Knee arthroscopy 1999    right  . Coronary angioplasty 09/04/1999  . Total knee arthroplasty 06/28/2011    Procedure: TOTAL KNEE ARTHROPLASTY;  Surgeon: Nilda Simmer, MD;  Location: [facility] OR;  Service: Orthopedics;  Laterality: Left;  TOTAL KNEE ARTHROPLASTY LEFT SIDE    Current Medications: Current Outpatient Prescriptions  Medication Sig Dispense Refill  . amLODipine (NORVASC) 5 MG tablet Take 5 mg by mouth daily.        Marland Kitchen aspirin 325 MG EC tablet Take 325 mg by mouth daily.        . cetirizine-pseudoephedrine (ZYRTEC-D) 5-120 MG per tablet Take 1 tablet by mouth daily.       . clopidogrel (PLAVIX) 75 MG tablet Take 75 mg by mouth daily.       . flunisolide (NASAREL) 29 MCG/ACT (0.025%) nasal spray Place 2 sprays into the nose at bedtime. Dose is for each nostril.       Marland Kitchen glimepiride (AMARYL) 1 MG tablet Take 1 mg by mouth daily before breakfast. Take when glucose level is above 120      . Glucosamine-Chondroit-Vit C-Mn (GLUCOSAMINE 1500 COMPLEX) CAPS Take 2 capsules by mouth daily.      Marland Kitchen lenalidomide (REVLIMID) 10 MG capsule Take one capsule daily for 14 days.  14 capsule  0  . metoprolol (TOPROL-XL) 50 MG 24 hr tablet Take 50 mg by mouth daily.        . Multiple Vitamins-Minerals (MULTIVITAMINS THER. W/MINERALS) TABS Take 1 tablet by mouth daily. Centrum silver      . nitroGLYCERIN (NITROSTAT) 0.4 MG SL tablet Place 0.4 mg under the tongue every 5 (five) minutes as needed. For chest pain       . omeprazole (PRILOSEC) 20 MG capsule Take 20 mg by mouth daily.        . simvastatin (ZOCOR) 20 MG tablet Take 20 mg by mouth at bedtime.        . valsartan (DIOVAN) 320 MG tablet Take 320 mg by mouth daily.       . Vitamin D,  Ergocalciferol, (DRISDOL) 50000 UNITS CAPS Take 50,000 Units by mouth every 14 (fourteen) days.         Allergies: Allergies  Allergen Reactions  . Sulfa Drugs Cross Reactors Rash    Family History: Family History  Problem Relation Age of Onset  . Anesthesia problems Neg Hx   . Hypotension Neg Hx   .  Malignant hyperthermia Neg Hx   . Pseudochol deficiency Neg Hx   . Heart attack Mother   . Heart attack Father   . Diabetes Father   . Cancer Sister   . Heart attack Brother   . Diabetes Brother     Social History: History  Substance Use Topics  . Smoking status: Never Smoker   . Smokeless tobacco: Never Used  . Alcohol Use: No    Review of Systems: 10 Point review of systems was completed and is negative except as noted above.   Physical Exam:   Blood pressure 148/60, pulse 72, temperature 97.8 F (36.6 C), temperature source Oral, resp. rate 18, height 5\' 8"  (1.727 m), weight 199 lb 11.2 oz (90.583 kg).   General appearance: Alert, cooperative, well nourished, no apparent distress  Head: Normocephalic, without obvious abnormality, atraumatic  Eyes: Left conjunctiva injected, corneas clear, PERRLA, EOMI  Nose: Nares and mucosa are normal, no drainage or sinus tenderness  Neck: No adenopathy, supple, symmetrical, trachea midline, thyroid not enlarged, no tenderness  Resp: Clear to auscultation bilaterally, diminished bibasilar breath sounds  Cardio: Regular rate and rhythm, S1, S2 normal, no murmur, click, rub or gallop  GI: Soft, non-tender, distended, normoactive bowel sounds, no organomegaly  Extremities: Extremities normal, atraumatic, no cyanosis or edema, large well healed surgical scar on left knee, scattered UE areas of hyperpigmentation  Lymph nodes: Cervical, supraclavicular, and axillary nodes normal  Neurologic: Grossly normal   Laboratory Data: Results for orders placed in visit on 08/01/12 (from the past 48 hour(s))  CBC WITH DIFFERENTIAL     Status:  Abnormal   Collection Time   08/01/12  2:44 PM      Component Value Range Comment   WBC 6.2  4.0 - 10.3 10e3/uL    NEUT# 2.9  1.5 - 6.5 10e3/uL    HGB 12.1 (*) 13.0 - 17.1 g/dL    HCT 21.3 (*) 08.6 - 49.9 %    Platelets 91 (*) 140 - 400 10e3/uL    MCV 97.8  79.3 - 98.0 fL    MCH 33.3  27.2 - 33.4 pg    MCHC 34.0  32.0 - 36.0 g/dL    RBC 5.78 (*) 4.69 - 5.82 10e6/uL    RDW 14.6  11.0 - 14.6 %    lymph# 1.7  0.9 - 3.3 10e3/uL    MONO# 1.5 (*) 0.1 - 0.9 10e3/uL    Eosinophils Absolute 0.1  0.0 - 0.5 10e3/uL    Basophils Absolute 0.1  0.0 - 0.1 10e3/uL    NEUT% 47.4  39.0 - 75.0 %    LYMPH% 26.6  14.0 - 49.0 %    MONO% 23.7 (*) 0.0 - 14.0 %    EOS% 1.0  0.0 - 7.0 %    BASO% 1.3  0.0 - 2.0 %   BASIC METABOLIC PANEL (CC13)     Status: Abnormal   Collection Time   08/01/12  2:44 PM      Component Value Range Comment   Sodium 141  136 - 145 mEq/L    Potassium 4.4  3.5 - 5.1 mEq/L    Chloride 108 (*) 98 - 107 mEq/L    CO2 23  22 - 29 mEq/L    Glucose 131 (*) 70 - 99 mg/dl    BUN 62.9  7.0 - 52.8 mg/dL    Creatinine 1.1  0.7 - 1.3 mg/dL    Calcium 9.1  8.4 - 41.3 mg/dL  Imaging Studies: 1.  Diagnostic chest x-ray, 2 view on 06/22/2011 showed no active cardiopulmonary abnormalities. 2.  Diagnostic metastatic bone survey on 12/30/2011 showed no lytic lesions within the skull, cervical spine, lumbar spine, or thoracic spine to suggest myeloma recurrence/progression. Additionally no lytic lesions within the pelvis, femurs, humeri, or ribs. 3.  CT of the abdomen and pelvis with contrast on 06/20/2012 showed no acute finding or finding to explain the patient's symptoms.  Diverticulosis without diverticulitis. Extensive atherosclerotic vascular disease including calcific coronary atherosclerosis.  Bilateral fat containing inguinal hernias, larger on the right. Fatty infiltration of liver.    Impression/Plan: John. Carlyon is now on maintenance Revlimid 10 mg PO daily, 2 weeks on, 1 week  off basis. We will check CBCs on a monthly basis and follow up with John. Deloria in 2 months.  We plan to see him again on 09/26/2012 at which time we will check CBC, CMP, LDH, Uric Acid, IgG level and Kappa/lambda light chains.  John. Engelmann was also advised to try taking Imodium every other day to see if this does not provide him relief of his frequent episodes of loose stools.   The patient and his wife are encourage to contact us in the interim if they have any questions or concerns.    Dr. Arline Asp and I conducted extensive records review in preparation for the transitioning of John. Uppal care to our service. We spent over 30 minutes face to face with this patient and his wife.   Larina Bras, NP-C 08/01/2012, 7:47 PM

## 2012-08-01 NOTE — Telephone Encounter (Signed)
Gave pt appt for labs and MD

## 2012-08-14 ENCOUNTER — Other Ambulatory Visit: Payer: Self-pay | Admitting: *Deleted

## 2012-08-14 DIAGNOSIS — C9000 Multiple myeloma not having achieved remission: Secondary | ICD-10-CM

## 2012-08-14 MED ORDER — LENALIDOMIDE 10 MG PO CAPS: ORAL_CAPSULE | ORAL | Status: DC

## 2012-08-15 NOTE — Progress Notes (Signed)
Pt has been approved for 2014 for a 25.00 co-pay card thru the [employer] Co-[employer] Card for his Revlimid.

## 2012-08-29 ENCOUNTER — Other Ambulatory Visit: Payer: Self-pay | Admitting: *Deleted

## 2012-08-29 ENCOUNTER — Other Ambulatory Visit (HOSPITAL_BASED_OUTPATIENT_CLINIC_OR_DEPARTMENT_OTHER): Payer: Medicare Other

## 2012-08-29 DIAGNOSIS — C9000 Multiple myeloma not having achieved remission: Secondary | ICD-10-CM

## 2012-08-29 LAB — CBC WITH DIFFERENTIAL/PLATELET
MCH: 33.4 pg (ref 27.2–33.4)
MCHC: 34.2 g/dL (ref 32.0–36.0)
MCV: 97.7 fL (ref 79.3–98.0)
RBC: 3.69 10*6/uL — ABNORMAL LOW (ref 4.20–5.82)
RDW: 14.8 % — ABNORMAL HIGH (ref 11.0–14.6)

## 2012-08-29 LAB — MANUAL DIFFERENTIAL
Metamyelocytes: 2 % — ABNORMAL HIGH (ref 0–0)
Myelocytes: 1 % — ABNORMAL HIGH (ref 0–0)
PLT EST: DECREASED
PROMYELO: 1 % — ABNORMAL HIGH (ref 0–0)

## 2012-08-29 NOTE — Telephone Encounter (Signed)
THIS REFILL REQUEST FOR REVLIMID WAS GIVEN TO DR.MURINSON'S NURSE, KATHY BUYCK,RN.

## 2012-08-30 ENCOUNTER — Other Ambulatory Visit: Payer: Self-pay | Admitting: *Deleted

## 2012-08-30 DIAGNOSIS — C9000 Multiple myeloma not having achieved remission: Secondary | ICD-10-CM

## 2012-08-30 MED ORDER — LENALIDOMIDE 10 MG PO CAPS: ORAL_CAPSULE | ORAL | Status: DC

## 2012-09-19 ENCOUNTER — Other Ambulatory Visit: Payer: Self-pay | Admitting: *Deleted

## 2012-09-19 ENCOUNTER — Telehealth: Payer: Self-pay | Admitting: *Deleted

## 2012-09-19 DIAGNOSIS — C9000 Multiple myeloma not having achieved remission: Secondary | ICD-10-CM

## 2012-09-19 MED ORDER — LENALIDOMIDE 10 MG PO CAPS: ORAL_CAPSULE | ORAL | Status: DC

## 2012-09-19 NOTE — Telephone Encounter (Signed)
[facility] faxed Revlimid prescription refill request.  Request to MD for review.

## 2012-09-26 ENCOUNTER — Telehealth: Payer: Self-pay | Admitting: Oncology

## 2012-09-26 ENCOUNTER — Encounter: Payer: Self-pay | Admitting: Oncology

## 2012-09-26 ENCOUNTER — Ambulatory Visit (HOSPITAL_BASED_OUTPATIENT_CLINIC_OR_DEPARTMENT_OTHER): Payer: Medicare Other | Admitting: Oncology

## 2012-09-26 ENCOUNTER — Other Ambulatory Visit (HOSPITAL_BASED_OUTPATIENT_CLINIC_OR_DEPARTMENT_OTHER): Payer: Medicare Other | Admitting: Lab

## 2012-09-26 ENCOUNTER — Other Ambulatory Visit: Payer: Self-pay | Admitting: Oncology

## 2012-09-26 VITALS — BP 161/66 | HR 77 | Temp 97.7°F | Resp 18 | Ht 68.0 in | Wt 198.6 lb

## 2012-09-26 DIAGNOSIS — C9000 Multiple myeloma not having achieved remission: Secondary | ICD-10-CM

## 2012-09-26 LAB — CBC WITH DIFFERENTIAL/PLATELET
Basophils Absolute: 0.1 10*3/uL (ref 0.0–0.1)
EOS%: 3.3 % (ref 0.0–7.0)
HGB: 12.3 g/dL — ABNORMAL LOW (ref 13.0–17.1)
MCH: 32.7 pg (ref 27.2–33.4)
MCV: 97.1 fL (ref 79.3–98.0)
MONO%: 24.6 % — ABNORMAL HIGH (ref 0.0–14.0)
RBC: 3.76 10*6/uL — ABNORMAL LOW (ref 4.20–5.82)
RDW: 14.6 % (ref 11.0–14.6)

## 2012-09-26 LAB — COMPREHENSIVE METABOLIC PANEL (CC13)
ALT: 25 U/L (ref 0–55)
Albumin: 3.2 g/dL — ABNORMAL LOW (ref 3.5–5.0)
Alkaline Phosphatase: 118 U/L (ref 40–150)
CO2: 25 mEq/L (ref 22–29)
Glucose: 139 mg/dl — ABNORMAL HIGH (ref 70–99)
Potassium: 3.8 mEq/L (ref 3.5–5.1)
Sodium: 140 mEq/L (ref 136–145)
Total Bilirubin: 0.42 mg/dL (ref 0.20–1.20)
Total Protein: 7.1 g/dL (ref 6.4–8.3)

## 2012-09-26 LAB — URIC ACID (CC13): Uric Acid, Serum: 6.5 mg/dl (ref 2.6–7.4)

## 2012-09-26 LAB — LACTATE DEHYDROGENASE (CC13): LDH: 153 U/L (ref 125–245)

## 2012-09-26 NOTE — Progress Notes (Signed)
This office note has been dictated.  #[id number]

## 2012-09-26 NOTE — Telephone Encounter (Signed)
GV AND PRINTED APPT SCHEDULE FOR PT FOR [facility]ARCH THRU [facility]

## 2012-09-26 NOTE — Progress Notes (Signed)
CC:   John Carter, M.D. John Carter Section, M.D.  PROBLEM LIST:   1.IgG kappa multiple myeloma diagnosed in February 2011.  Bone marrow aspirate and biopsy on 09/19/2009 showed 48% plasma cells.  IgG level at that time was 4910.  Serum kappa light chains were 3.90.  Beta 2 microglobulin was 3.77.  Metastatic bone survey from 09/05/2009 showed no definite lytic lesions.  The patient received treatment with subcutaneous Velcade along with melphalan and prednisone from April 2011 through March 2012 with an excellent response to treatment.  The patient then was on Revlimid with weekly Decadron.  These treatments were apparently started in late January 2013.  The patient currently is on Revlimid 10 mg daily for 2 weeks out of every 3 weeks.  2. Myocardial infarction with cardiac catheterization and stent placement in 1979.  3. Hypertension.  4. Renal insufficiency.  5. Diverticulitis without diverticulosis.  6. GERD.  7. Chronic back pain.  8. Sleep apnea with CPAP.  9. TIA.  10. Melanoma.  11. History of colonic polyps. 12. Left total knee replacement by Dr. Salvatore Carter. 13. Bilateral inguinal hernia seen on CT scan from 06/20/2012. 14. Right leg numbness.   MEDICATIONS:  Reviewed and recorded. Current Outpatient Prescriptions  Medication Sig Dispense Refill  . amLODipine (NORVASC) 5 MG tablet Take 5 mg by mouth daily.        Marland Kitchen aspirin 325 MG EC tablet Take 325 mg by mouth daily.        . cetirizine-pseudoephedrine (ZYRTEC-D) 5-120 MG per tablet Take 1 tablet by mouth daily.       . clopidogrel (PLAVIX) 75 MG tablet Take 75 mg by mouth daily.       . flunisolide (NASAREL) 29 MCG/ACT (0.025%) nasal spray Place 2 sprays into the nose at bedtime. Dose is for each nostril.       . Glucosamine-Chondroit-Vit C-Mn (GLUCOSAMINE 1500 COMPLEX) CAPS Take 2 capsules by mouth daily.      Marland Kitchen lenalidomide (REVLIMID) 10 MG capsule Take one capsule daily for 14 days, then 7days rest.  14 capsule  0   . metoprolol (TOPROL-XL) 50 MG 24 hr tablet Take 50 mg by mouth daily.        . Multiple Vitamins-Minerals (MULTIVITAMINS THER. W/MINERALS) TABS Take 1 tablet by mouth daily. Centrum silver      . nitroGLYCERIN (NITROSTAT) 0.4 MG SL tablet Place 0.4 mg under the tongue every 5 (five) minutes as needed. For chest pain       . omeprazole (PRILOSEC) 20 MG capsule Take 20 mg by mouth daily.        . simvastatin (ZOCOR) 20 MG tablet Take 20 mg by mouth at bedtime.        . valsartan (DIOVAN) 320 MG tablet Take 320 mg by mouth daily.       . Vitamin D, Ergocalciferol, (DRISDOL) 50000 UNITS CAPS Take 50,000 Units by mouth every 14 (fourteen) days.        No current facility-administered medications for this visit.    IMMUNIZATIONS:  The patient received influenza shot on 05/02/2012.   SMOKING HISTORY:  Patient has never smoked cigarettes.   HISTORY:  I am seeing John Carter today for followup of his IgG kappa multiple myeloma with diagnosis going back now 3 years to February 2011.  The patient is here today with his wife, John Carter.  He was last seen by [place] on 08/01/2012 and prior to that on 06/13/2012.  The patient just completed a  course of Revlimid which he takes for 2 weeks 10 mg daily. He is off 1 week and will restart Revlimid again on March 18th.  In general, the patient's condition seems to be stable, although somewhat tenuous on the basis of his age and multiple medical problems. He does not seem to be having any significant pain.  He has had back surgery in the past, apparently has some residual numbness down his right leg and some unsteadiness of gait, but he denies any falls.  He continues to have intermittent diarrhea, which may have pre-dated his diagnosis.  He takes Imodium anywhere from 0-2 times per week.  Rarely, he will have incontinence.  He has some dry eyes.  He bruises with trauma.  He has some bruises on his left arm from his dog.  No undue bleeding.  The patient's  main problems seems to be that he feels like he is getting generally weaker.  He is more tired in general when he exerts himself, even if it is just getting dressed in the morning.  Aside from this, the patient seems to be more or less stable.  He is able to drive.  PHYSICAL EXAMINATION:  General:  The patient is an elderly gentleman, 79 years old, looks somewhat frail and needs help getting up and down from the examining table.  He has a somewhat unsteady gait.  Weight is 198 pounds 9.6 ounces, height 5 feet 8 inches, body surface area 2.08 sq m. Vital Signs:  Blood pressure 161/66.  Other vital signs are normal. Weight seems to be stable.  HEENT:  He wears glasses.  There may be a little bit of left conjunctival injection.  Mouth and pharynx are benign.  There is no peripheral adenopathy palpable.  Lungs:  Clear. Cardiac:  Regular rhythm with soft systolic ejection murmur.  Abdomen: Somewhat distended, nontender, with diastasis recti.  No organomegaly, masses, or tenderness.  Extremities:  1+ edema of both ankles.  He has some degenerative changes in his fingers.  Neurologic:  Some numbness of the right leg.  Access:  He does not have a Port-A-Cath or central catheter.  LABORATORY DATA:  Today, white count 5.6, ANC 2.8, hemoglobin 12.3, hematocrit 36.5, platelets 66,000.  On 08/29/2012 platelets were 86,000 and on 08/01/2012 platelets were 91,000.  If we go back to May 23, 2012, platelets were 156,000.  Chemistries today essentially normal, except for an albumin of 3.2 and a glucose of 139.  BUN 25, creatinine 1.0, LDH 153.  IgG level serum light chains and uric acid are pending today.  On 06/07/2012 serum immunofixation electrophoresis was positive for IgG kappa monoclonal protein.  The M spike was 0.86 grams percent as compared with a value of 2.33 grams percent on 02/23/2010.  IgG level was 1550 on 06/07/2012.  IgA level was 235 and IgM level 28.  IMAGING STUDIES:   1.  Diagnostic chest x-ray, 2 view on 06/22/2011 showed no active cardiopulmonary abnormalities.  2. Diagnostic metastatic bone survey on 12/30/2011 showed no lytic lesions within the skull, cervical spine, lumbar spine, or thoracic spine to suggest myeloma recurrence/progression. Additionally no lytic lesions within the pelvis, femurs, humeri, or ribs.  3. CT of the abdomen and pelvis with contrast on 06/20/2012 showed no acute finding or finding to explain the patient's symptoms.  Diverticulosis without diverticulitis. Extensive atherosclerotic vascular disease including calcific coronary atherosclerosis. Bilateral fat containing inguinal hernias, larger on the right. Fatty infiltration of liver.   IMPRESSION AND PLAN:  All  things considered, John Carter seems to be doing fairly well.  He has had an excellent response of his myeloma to previous treatments.  We are trying to maintain control of his disease with Revlimid as a single agent.  The patient is no longer on intermittent Decadron.  Of concern today is the a platelet count of 66,000.  The patient will resume Revlimid on March 18th.  On that day I would like him to come in and we will check a CBC.  I will plan to check CBC every 3 weeks prior to when he takes his Revlimid.  Those dates will be 10/03/2012, 10/24/2012, and 11/14/2012.  We will plan to see John Carter again around May 20th, at which time we will check CBC, chemistries, LDH, IgG level, serum light chains, and a urine protein-to- creatinine ratio.  At some point, we may want to obtain a 24-hour urine collection for total protein and urine immunofixation electrophoresis. If the patient's platelet count is not showing adequate recovery, we may want to go to a regimen where he is taking Revlimid for 2 weeks on and then a 2 week rest period.  He may need a little bit more time for platelet count recovery.  I noticed the patient's serum glucoses tend to run a little high.   On one occasion, it was up near 180.  I suggested he may want to discuss this with Dr. Earl Gala and have a hemoglobin A1c checked, if this has  not already been done.  In addition, the patient is complaining of some dry eyes. He may want to see his ophthalmologist.  In general, he seems quite frail and elderly.  I am doubtful that his generalized weakness relates to his myeloma.  Diarrhea seems to be fairly well controlled with Imodium as needed.  Extensive records regarding this patient who had previously been cared for by Dr. Arlan Organ were reviewed.    ______________________________ Samul Dada, M.D. DSM/MEDQ  D:  09/26/2012  T:  09/26/2012  Job:  [id number]

## 2012-10-02 LAB — KAPPA/LAMBDA LIGHT CHAINS: Kappa:Lambda Ratio: 1.44 (ref 0.26–1.65)

## 2012-10-03 ENCOUNTER — Telehealth: Payer: Self-pay

## 2012-10-03 ENCOUNTER — Other Ambulatory Visit (HOSPITAL_BASED_OUTPATIENT_CLINIC_OR_DEPARTMENT_OTHER): Payer: Medicare Other

## 2012-10-03 DIAGNOSIS — C9000 Multiple myeloma not having achieved remission: Secondary | ICD-10-CM

## 2012-10-03 LAB — CBC WITH DIFFERENTIAL/PLATELET
Basophils Absolute: 0.1 10*3/uL (ref 0.0–0.1)
Eosinophils Absolute: 0.1 10*3/uL (ref 0.0–0.5)
HCT: 37.7 % — ABNORMAL LOW (ref 38.4–49.9)
HGB: 12.8 g/dL — ABNORMAL LOW (ref 13.0–17.1)
LYMPH%: 24.9 % (ref 14.0–49.0)
MCV: 96.9 fL (ref 79.3–98.0)
MONO#: 1.2 10*3/uL — ABNORMAL HIGH (ref 0.1–0.9)
NEUT#: 3.2 10*3/uL (ref 1.5–6.5)
Platelets: 70 10*3/uL — ABNORMAL LOW (ref 140–400)
RBC: 3.9 10*6/uL — ABNORMAL LOW (ref 4.20–5.82)
WBC: 6.1 10*3/uL (ref 4.0–10.3)

## 2012-10-03 NOTE — Telephone Encounter (Signed)
S/w wife that it is OK per Dr Arline Asp for pt to start taking his revlimid tonight. She spoke back the instructions.

## 2012-10-04 ENCOUNTER — Other Ambulatory Visit: Payer: Self-pay | Admitting: *Deleted

## 2012-10-04 DIAGNOSIS — C9000 Multiple myeloma not having achieved remission: Secondary | ICD-10-CM

## 2012-10-04 MED ORDER — LENALIDOMIDE 10 MG PO CAPS: ORAL_CAPSULE | ORAL | Status: DC

## 2012-10-04 NOTE — Telephone Encounter (Signed)
Revlimid refill request sent to Dr Arline Asp.

## 2012-10-04 NOTE — Addendum Note (Signed)
Addended by: Laroy Apple E on: 10/04/2012 01:00 PM   Modules accepted: Orders

## 2012-10-24 ENCOUNTER — Telehealth: Payer: Self-pay | Admitting: Oncology

## 2012-10-24 ENCOUNTER — Other Ambulatory Visit (HOSPITAL_BASED_OUTPATIENT_CLINIC_OR_DEPARTMENT_OTHER): Payer: Medicare Other | Admitting: Lab

## 2012-10-24 DIAGNOSIS — C9000 Multiple myeloma not having achieved remission: Secondary | ICD-10-CM

## 2012-10-24 LAB — CBC WITH DIFFERENTIAL/PLATELET
BASO%: 0.7 % (ref 0.0–2.0)
Eosinophils Absolute: 0.1 10*3/uL (ref 0.0–0.5)
HCT: 34.8 % — ABNORMAL LOW (ref 38.4–49.9)
LYMPH%: 27.1 % (ref 14.0–49.0)
MCHC: 32.8 g/dL (ref 32.0–36.0)
MCV: 96.1 fL (ref 79.3–98.0)
MONO#: 1.7 10*3/uL — ABNORMAL HIGH (ref 0.1–0.9)
MONO%: 24.3 % — ABNORMAL HIGH (ref 0.0–14.0)
NEUT%: 47 % (ref 39.0–75.0)
Platelets: 108 10*3/uL — ABNORMAL LOW (ref 140–400)
WBC: 6.8 10*3/uL (ref 4.0–10.3)

## 2012-10-25 ENCOUNTER — Other Ambulatory Visit: Payer: Self-pay | Admitting: Medical Oncology

## 2012-10-25 NOTE — Telephone Encounter (Signed)
I called pt to inform him that Dr. Arline Asp said it is ok to restart his revlimid. He had labs drawn yesterday and they were ok. He voiced understanding.

## 2012-11-08 ENCOUNTER — Telehealth: Payer: Self-pay

## 2012-11-08 NOTE — Telephone Encounter (Signed)
Pt called asking about refill for his revlimid. He is due to restart Tues 4/29. He also mentioned he changed his insurance on April 1st. I called [employer] to notify them of insurance change. They are aware. The [employer] rep stated she would notify [facility] to send [place] a refil request. I called pt back and reminded him of appt 4/29 for labs before he restarts his revlimid.

## 2012-11-13 ENCOUNTER — Telehealth: Payer: Self-pay

## 2012-11-13 ENCOUNTER — Other Ambulatory Visit: Payer: Self-pay

## 2012-11-13 DIAGNOSIS — C9000 Multiple myeloma not having achieved remission: Secondary | ICD-10-CM

## 2012-11-13 MED ORDER — LENALIDOMIDE 10 MG PO CAPS: ORAL_CAPSULE | ORAL | Status: DC

## 2012-11-13 NOTE — Telephone Encounter (Signed)
I called [facility] then [facility] to find if they had his revlimid Rx. They said his insurance had run out. I called [facility], and spoke with marguerite. She said we need to send his PANF id # [id number] and a copy of his new insurance card and a prescription with authorization number to [facility] to get his revlimid sent. i called pt back and asked him to come by the office so we could copy his new insurance card. It is not in our computer. Pt stated he will bring it by today.

## 2012-11-13 NOTE — Telephone Encounter (Signed)
S/w pt; we received copy of his insurance cards, Rx faxed to [facility] for his revlimid.PANF id # [id number] included and copy of insurance cards included in fax.

## 2012-11-14 ENCOUNTER — Other Ambulatory Visit (HOSPITAL_BASED_OUTPATIENT_CLINIC_OR_DEPARTMENT_OTHER): Payer: Medicare Other

## 2012-11-14 ENCOUNTER — Telehealth: Payer: Self-pay

## 2012-11-14 DIAGNOSIS — C9000 Multiple myeloma not having achieved remission: Secondary | ICD-10-CM

## 2012-11-14 LAB — CBC WITH DIFFERENTIAL/PLATELET
Basophils Absolute: 0.1 10*3/uL (ref 0.0–0.1)
EOS%: 1.1 % (ref 0.0–7.0)
HCT: 35.2 % — ABNORMAL LOW (ref 38.4–49.9)
HGB: 11.8 g/dL — ABNORMAL LOW (ref 13.0–17.1)
LYMPH%: 18 % (ref 14.0–49.0)
MCH: 32.3 pg (ref 27.2–33.4)
MCV: 96.3 fL (ref 79.3–98.0)
MONO%: 20 % — ABNORMAL HIGH (ref 0.0–14.0)
NEUT%: 59.8 % (ref 39.0–75.0)

## 2012-11-14 NOTE — Telephone Encounter (Signed)
OK to start revlimid when it is delivered per Dr Arline Asp. Asked pt to call when he actually receives Revlimid.

## 2012-11-14 NOTE — Telephone Encounter (Signed)
Called pt to let him know that [facility] pharmacy received his revlimid prescription and his new insurance information today and they are processing it.  [facility] forwarded prescription to [facility].

## 2012-11-15 ENCOUNTER — Other Ambulatory Visit: Payer: Self-pay

## 2012-11-15 DIAGNOSIS — C9000 Multiple myeloma not having achieved remission: Secondary | ICD-10-CM

## 2012-11-15 MED ORDER — LENALIDOMIDE 10 MG PO CAPS: ORAL_CAPSULE | ORAL | Status: DC

## 2012-11-21 ENCOUNTER — Telehealth: Payer: Self-pay

## 2012-11-21 NOTE — Telephone Encounter (Signed)
Pt called stating he got his Revlimid Sat 5/3 and started taking it after talking to doctor on call. His next appt for lab is 5/20 during his week off.

## 2012-12-05 ENCOUNTER — Other Ambulatory Visit (HOSPITAL_BASED_OUTPATIENT_CLINIC_OR_DEPARTMENT_OTHER): Payer: Medicare Other

## 2012-12-05 DIAGNOSIS — C9000 Multiple myeloma not having achieved remission: Secondary | ICD-10-CM

## 2012-12-05 LAB — CBC WITH DIFFERENTIAL/PLATELET
BASO%: 0.8 % (ref 0.0–2.0)
Eosinophils Absolute: 0.1 10*3/uL (ref 0.0–0.5)
HCT: 35.5 % — ABNORMAL LOW (ref 38.4–49.9)
MCHC: 33.6 g/dL (ref 32.0–36.0)
MONO#: 0.9 10*3/uL (ref 0.1–0.9)
NEUT#: 3 10*3/uL (ref 1.5–6.5)
NEUT%: 55 % (ref 39.0–75.0)
WBC: 5.5 10*3/uL (ref 4.0–10.3)
lymph#: 1.4 10*3/uL (ref 0.9–3.3)

## 2012-12-05 LAB — LACTATE DEHYDROGENASE (CC13): LDH: 148 U/L (ref 125–245)

## 2012-12-05 LAB — PROTEIN / CREATININE RATIO, URINE
Creatinine, Urine: 91.8 mg/dL
Total Protein, Urine: 39 mg/dL

## 2012-12-05 LAB — COMPREHENSIVE METABOLIC PANEL (CC13)
CO2: 25 mEq/L (ref 22–29)
Creatinine: 1.2 mg/dL (ref 0.7–1.3)
Glucose: 152 mg/dl — ABNORMAL HIGH (ref 70–99)
Total Bilirubin: 0.27 mg/dL (ref 0.20–1.20)

## 2012-12-06 LAB — KAPPA/LAMBDA LIGHT CHAINS
Kappa free light chain: 9.32 mg/dL — ABNORMAL HIGH (ref 0.33–1.94)
Kappa:Lambda Ratio: 1.97 — ABNORMAL HIGH (ref 0.26–1.65)

## 2012-12-07 ENCOUNTER — Ambulatory Visit (HOSPITAL_BASED_OUTPATIENT_CLINIC_OR_DEPARTMENT_OTHER): Payer: Medicare Other | Admitting: Oncology

## 2012-12-07 ENCOUNTER — Other Ambulatory Visit: Payer: Self-pay | Admitting: *Deleted

## 2012-12-07 ENCOUNTER — Telehealth: Payer: Self-pay | Admitting: Medical Oncology

## 2012-12-07 ENCOUNTER — Other Ambulatory Visit: Payer: Medicare Other | Admitting: Lab

## 2012-12-07 ENCOUNTER — Telehealth: Payer: Self-pay | Admitting: Oncology

## 2012-12-07 ENCOUNTER — Encounter: Payer: Self-pay | Admitting: Oncology

## 2012-12-07 VITALS — BP 162/74 | HR 88 | Temp 97.5°F | Resp 20 | Ht 68.0 in | Wt 195.3 lb

## 2012-12-07 DIAGNOSIS — R197 Diarrhea, unspecified: Secondary | ICD-10-CM

## 2012-12-07 DIAGNOSIS — K5900 Constipation, unspecified: Secondary | ICD-10-CM

## 2012-12-07 DIAGNOSIS — C9000 Multiple myeloma not having achieved remission: Secondary | ICD-10-CM

## 2012-12-07 DIAGNOSIS — I10 Essential (primary) hypertension: Secondary | ICD-10-CM

## 2012-12-07 MED ORDER — LENALIDOMIDE 10 MG PO CAPS: ORAL_CAPSULE | ORAL | Status: DC

## 2012-12-07 NOTE — Progress Notes (Signed)
This office note has been dictated.  #[id number]

## 2012-12-07 NOTE — Telephone Encounter (Signed)
Faxed to [facility].

## 2012-12-08 NOTE — Progress Notes (Signed)
CC:   [name], M.D. [name] Section, M.D.  PROBLEM LIST:  1.IgG kappa multiple myeloma diagnosed in February 2011. Bone marrow  aspirate and biopsy on 09/19/2009 showed 48% plasma cells. IgG level at  that time was 4910. Serum kappa light chains were 3.90. Beta 2  microglobulin was 3.77. Metastatic bone survey from 09/05/2009 showed  no definite lytic lesions. The patient received treatment with  subcutaneous Velcade along with melphalan and prednisone from April 2011  through March 2012 with an excellent response to treatment. The patient  then was on Revlimid with weekly Decadron. These treatments were  apparently started in late January 2013. The patient currently is on  Revlimid 10 mg daily for 2 weeks out of every 3 weeks.  2. Myocardial infarction with cardiac catheterization and stent placement in 1979.  3. Hypertension.  4. Renal insufficiency.  5. Diverticulitis without diverticulosis.  6. GERD.  7. Chronic back pain.  8. Sleep apnea with CPAP.  9. TIA.  10. History of colonic polyps.  11. Left total knee replacement by Dr. Salvatore Marvel on 06/28/2011.  12. Bilateral inguinal hernia seen on CT scan from 06/20/2012.  13. Right leg numbness. 14. Systolic ejection murmur. 15. Diarrhea alternating with constipation, possibly related to Revlimid.   MEDICATIONS:  Reviewed and recorded. Current Outpatient Prescriptions  Medication Sig Dispense Refill  . amLODipine (NORVASC) 5 MG tablet Take 5 mg by mouth daily.        Marland Kitchen aspirin 325 MG EC tablet Take 325 mg by mouth daily.        . cetirizine-pseudoephedrine (ZYRTEC-D) 5-120 MG per tablet Take 1 tablet by mouth daily.       . clopidogrel (PLAVIX) 75 MG tablet Take 75 mg by mouth daily.       . flunisolide (NASAREL) 29 MCG/ACT (0.025%) nasal spray Place 2 sprays into the nose at bedtime. Dose is for each nostril.       Marland Kitchen gabapentin (NEURONTIN) 100 MG capsule Take 100 mg by mouth Nightly. 2 tabs      . metoprolol  (TOPROL-XL) 50 MG 24 hr tablet Take 50 mg by mouth daily.        . nitroGLYCERIN (NITROSTAT) 0.4 MG SL tablet Place 0.4 mg under the tongue every 5 (five) minutes as needed. For chest pain       . omeprazole (PRILOSEC) 20 MG capsule Take 20 mg by mouth daily.        . simvastatin (ZOCOR) 20 MG tablet Take 20 mg by mouth at bedtime.        . valsartan (DIOVAN) 320 MG tablet Take 320 mg by mouth daily.       . Vitamin D, Ergocalciferol, (DRISDOL) 50000 UNITS CAPS Take 50,000 Units by mouth every 14 (fourteen) days.       Marland Kitchen lenalidomide (REVLIMID) 10 MG capsule Take one capsule daily for 14 days, then 7days rest.  14 capsule  0  . Multiple Vitamins-Minerals (MULTIVITAMINS THER. W/MINERALS) TABS Take 1 tablet by mouth daily. Centrum silver       No current facility-administered medications for this visit.    TREATMENT PROGRAM:   Revlimid currently 10 mg daily for 2 weeks out of every 3 weeks. Revlimid was started in late January 2013.   IMMUNIZATIONS: The patient received influenza shot on 05/02/2012.   SMOKING HISTORY: Patient has never smoked cigarettes.   HISTORY:  John Carter was seen today for followup of his IgG kappa multiple myeloma with diagnosis going  back to February 2011.  The patient is here today with his wife [name] who goes by the name of Kathie Rhodes. The patient was last seen by [place] on 09/26/2012.  He continues to take Revlimid 10 mg for 2 weeks out of every 3 weeks.  The patient tells me that there was a delay of 1 week because he did not get his shipment of Revlimid in time.  He started taking Revlimid for the latest cycle around May 3rd and stopped taking it around May 16th.  He is currently on break and will resume Revlimid around May 24th  The patient's main problem seems to be alternating loose stools and constipation which he says dates back to the time he started taking Revlimid back in January 2013.  The patient does use Imodium, but he says this tends to  constipate him.  He says that loose stools tend to come after he has had constipation.  He has been having some loose stools this week, but has not taken any Imodium.  We discussed this issue last time.  I suggested that he may want to go on a regular regimen of Imodium, either half to 1 dose every other day.  He can start with something on the conservative side and then increase it as needed. The patient thinks that when he takes an Imodium he tends to get constipated and will not have a bowel movement for a couple of days.  I also suggested to him that he may want to discuss this with Dr. Earl Gala and perhaps see a gastroenterologist.  Certainly, it is possible that the Revlimid may be playing a role in his bowel problems.  The patient also was having some arthritic pain involving his right knee.  He said he has been sleeping in a chair recently.  Aside from these issues, there really does not seem to be much of a change in the patient's condition.  Overall, he appears to be stable.  PHYSICAL EXAMINATION:  General:  The patient is elderly and frail.  He walks very stooped over.  Weight is 195 pounds 4.8 ounces.  Height 5 feet 8 inches, body surface area 2.06 sq m.  Vital Signs:  Blood pressure 162/74.  Other vital signs are normal.  HEENT:  There is no scleral icterus.  Mouth and pharynx are benign.  There is no peripheral adenopathy palpable.  Lungs:  Clear.  Cardiac:  Regular rhythm with systolic ejection murmur.  Abdomen:  Soft, nontender, with diastasis recti.  No organomegaly or masses.  Extremities:  1 to 2+ edema of both ankles which are somewhat tight.  He has degenerative changes in his fingers.  LABORATORY DATA:  From 12/05/2012 showed white count of 5.5, ANC 3.0, hemoglobin 11.9, hematocrit 35.5, platelets were 65,000.  On 11/14/2012 platelets were 85,000.  On 10/24/2012 platelets were 108,000. Chemistries from 12/05/2012 notable for a BUN of 28, creatinine 1.2, albumin  3.1, total protein 7.3, and thus globulins were 4.2, and LDH 148.  IgG level was 1640 as compared with 1470 on 09/26/2012.  IgG level is only minimally elevated.  Kappa free light chain was 9.32 with a kappa-to-lambda ratio of 1.97.  On 09/26/2012 the kappa light chain was 4.53 with a kappa-to-lambda ratio of 1.44.  Urine protein-to-creatinine ratio on 12/05/2012 was 0.42, with normal being less than 0.15.  IMAGING STUDIES:  1. Diagnostic chest x-ray, 2 view on 06/22/2011 showed no active cardiopulmonary abnormalities.  2. Diagnostic metastatic bone survey on 12/30/2011 showed no  lytic lesions within the skull, cervical spine, lumbar spine, or thoracic spine to suggest myeloma recurrence/progression. Additionally no lytic lesions within the pelvis, femurs, humeri, or ribs.  3. CT of the abdomen and pelvis with contrast on 06/20/2012 showed no acute finding or finding to explain the patient's symptoms.  Diverticulosis without diverticulitis. Extensive atherosclerotic vascular disease including calcific coronary atherosclerosis. Bilateral fat containing inguinal hernias, larger on the right. Fatty infiltration of liver.   IMPRESSION AND PLAN:  Mr. Modi seems to be more or less stable.  I am concerned about his gastrointestinal symptoms.  History is a little bit vague.  I tried to encourage the patient to take either a half or 1 Imodium every couple of days to see if that would help.  He can adjust the dose accordingly, either up or down, depending on the status of his stools.  Ultimately, he may need an appointment with a gastroenterologist.  If it is felt that the Revlimid is the causative agent, then other options to try would be pomalidomide, or perhaps going back to Velcade which the patient had been on at the time of diagnosis. He could conceivably receive this as a single agent or perhaps in combination with a small dose of weekly Decadron.  At one time the patient apparently was on  Decadron with his Revlimid, but he is not on Decadron at the present time.  We will plan to collect a 24-hour urine for protein and urine immunofixation electrophoresis.  We will be checking CBCs every 2 weeks. The patient's platelet count is running low.  He is due to resume his Revlimid for the next cycle this weekend on may 24th.  We will have Mr. Sada return in 2 months, which will be around July 22nd, at which time we will check CBC, chemistries, IgG level, and serum light chains.    ______________________________ Samul Dada, M.D. DSM/MEDQ  D:  12/07/2012  T:  12/08/2012  Job:  [id number]

## 2012-12-08 NOTE — Telephone Encounter (Signed)
I called pt to inform him that we faxed a new prescription for his revlimid.

## 2012-12-18 ENCOUNTER — Other Ambulatory Visit: Payer: Self-pay | Admitting: Oncology

## 2012-12-18 DIAGNOSIS — C9000 Multiple myeloma not having achieved remission: Secondary | ICD-10-CM

## 2012-12-20 ENCOUNTER — Other Ambulatory Visit (HOSPITAL_BASED_OUTPATIENT_CLINIC_OR_DEPARTMENT_OTHER): Payer: Medicare Other

## 2012-12-20 DIAGNOSIS — C9000 Multiple myeloma not having achieved remission: Secondary | ICD-10-CM

## 2012-12-20 LAB — CBC WITH DIFFERENTIAL/PLATELET
BASO%: 0.6 % (ref 0.0–2.0)
Basophils Absolute: 0 10*3/uL (ref 0.0–0.1)
EOS%: 3.5 % (ref 0.0–7.0)
HGB: 11.9 g/dL — ABNORMAL LOW (ref 13.0–17.1)
MCH: 32.5 pg (ref 27.2–33.4)
MCHC: 33.6 g/dL (ref 32.0–36.0)
MONO#: 1.9 10*3/uL — ABNORMAL HIGH (ref 0.1–0.9)
RDW: 15.1 % — ABNORMAL HIGH (ref 11.0–14.6)
WBC: 7.7 10*3/uL (ref 4.0–10.3)
lymph#: 1.3 10*3/uL (ref 0.9–3.3)

## 2012-12-21 LAB — UIFE/LIGHT CHAINS/TP QN, 24-HR UR
Albumin, U: DETECTED
Alpha 2, Urine: DETECTED — AB
Beta, Urine: DETECTED — AB
Free Kappa/Lambda Ratio: 9.5 ratio (ref 2.04–10.37)
Free Lt Chn Excr Rate: 336.6 mg/d
Total Protein, Urine-Ur/day: 882 mg/d — ABNORMAL HIGH (ref 10–140)
Total Protein, Urine: 40.1 mg/dL

## 2012-12-22 ENCOUNTER — Other Ambulatory Visit: Payer: Self-pay | Admitting: *Deleted

## 2012-12-22 DIAGNOSIS — C9000 Multiple myeloma not having achieved remission: Secondary | ICD-10-CM

## 2012-12-22 MED ORDER — LENALIDOMIDE 10 MG PO CAPS: ORAL_CAPSULE | ORAL | Status: DC

## 2012-12-22 NOTE — Addendum Note (Signed)
Addended by: Arvilla Meres on: 12/22/2012 03:35 PM   Modules accepted: Orders

## 2012-12-22 NOTE — Telephone Encounter (Signed)
THIS REFILL REQUEST FOR REVLIMID WAS GIVEN TO DR.MURINSON'S NURSE, KATHY BUYCK,RN. 

## 2012-12-26 ENCOUNTER — Other Ambulatory Visit: Payer: Self-pay | Admitting: Medical Oncology

## 2013-01-03 ENCOUNTER — Other Ambulatory Visit (HOSPITAL_BASED_OUTPATIENT_CLINIC_OR_DEPARTMENT_OTHER): Payer: Medicare Other | Admitting: Lab

## 2013-01-03 DIAGNOSIS — C9000 Multiple myeloma not having achieved remission: Secondary | ICD-10-CM

## 2013-01-03 LAB — CBC WITH DIFFERENTIAL/PLATELET
Eosinophils Absolute: 0.3 10*3/uL (ref 0.0–0.5)
LYMPH%: 22.4 % (ref 14.0–49.0)
MCV: 95.6 fL (ref 79.3–98.0)
MONO%: 25.6 % — ABNORMAL HIGH (ref 0.0–14.0)
NEUT#: 2.6 10*3/uL (ref 1.5–6.5)
Platelets: 79 10*3/uL — ABNORMAL LOW (ref 140–400)
RBC: 3.68 10*6/uL — ABNORMAL LOW (ref 4.20–5.82)

## 2013-01-10 ENCOUNTER — Other Ambulatory Visit: Payer: Self-pay | Admitting: *Deleted

## 2013-01-10 ENCOUNTER — Telehealth: Payer: Self-pay | Admitting: *Deleted

## 2013-01-10 DIAGNOSIS — C9000 Multiple myeloma not having achieved remission: Secondary | ICD-10-CM

## 2013-01-10 MED ORDER — LENALIDOMIDE 10 MG PO CAPS: ORAL_CAPSULE | ORAL | Status: DC

## 2013-01-10 NOTE — Telephone Encounter (Signed)
[facility] faxed Revlimid refill request.  Request to provider for review.

## 2013-01-17 ENCOUNTER — Other Ambulatory Visit (HOSPITAL_BASED_OUTPATIENT_CLINIC_OR_DEPARTMENT_OTHER): Payer: Medicare Other

## 2013-01-17 ENCOUNTER — Other Ambulatory Visit: Payer: Self-pay | Admitting: Medical Oncology

## 2013-01-17 DIAGNOSIS — C9000 Multiple myeloma not having achieved remission: Secondary | ICD-10-CM

## 2013-01-17 LAB — CBC WITH DIFFERENTIAL/PLATELET
BASO%: 1.2 % (ref 0.0–2.0)
EOS%: 2.4 % (ref 0.0–7.0)
LYMPH%: 21.1 % (ref 14.0–49.0)
MCHC: 34.5 g/dL (ref 32.0–36.0)
MCV: 94.9 fL (ref 79.3–98.0)
MONO%: 14.9 % — ABNORMAL HIGH (ref 0.0–14.0)
Platelets: 60 10*3/uL — ABNORMAL LOW (ref 140–400)
RBC: 3.78 10*6/uL — ABNORMAL LOW (ref 4.20–5.82)
WBC: 6.5 10*3/uL (ref 4.0–10.3)

## 2013-01-18 ENCOUNTER — Telehealth: Payer: Self-pay | Admitting: *Deleted

## 2013-01-18 NOTE — Telephone Encounter (Signed)
Lm gv appt d/t for 02/07/13 labs @ 9:30am and ov @10am . Pt wanted to request this date due to already have appts on 02/08/13. Pt is aware that i will mail a letter/cal as well...td

## 2013-01-26 ENCOUNTER — Other Ambulatory Visit: Payer: Self-pay | Admitting: *Deleted

## 2013-01-26 DIAGNOSIS — C9000 Multiple myeloma not having achieved remission: Secondary | ICD-10-CM

## 2013-01-26 MED ORDER — LENALIDOMIDE 10 MG PO CAPS: ORAL_CAPSULE | ORAL | Status: DC

## 2013-01-26 NOTE — Addendum Note (Signed)
Addended by: Arvilla Meres on: 01/26/2013 02:56 PM   Modules accepted: Orders

## 2013-01-26 NOTE — Telephone Encounter (Signed)
THIS REFILL REQUEST FOR REVLIMID WAS GIVEN TO DR.MURINSON'S NURSE, ROBIN BASS,RN.

## 2013-01-31 ENCOUNTER — Other Ambulatory Visit (HOSPITAL_BASED_OUTPATIENT_CLINIC_OR_DEPARTMENT_OTHER): Payer: Medicare Other

## 2013-01-31 DIAGNOSIS — C9000 Multiple myeloma not having achieved remission: Secondary | ICD-10-CM

## 2013-01-31 DIAGNOSIS — D649 Anemia, unspecified: Secondary | ICD-10-CM

## 2013-01-31 LAB — COMPREHENSIVE METABOLIC PANEL (CC13)
ALT: 20 U/L (ref 0–55)
BUN: 25.2 mg/dL (ref 7.0–26.0)
CO2: 23 mEq/L (ref 22–29)
Calcium: 9 mg/dL (ref 8.4–10.4)
Chloride: 103 mEq/L (ref 98–109)
Creatinine: 1.1 mg/dL (ref 0.7–1.3)
Glucose: 172 mg/dl — ABNORMAL HIGH (ref 70–140)
Total Bilirubin: 0.36 mg/dL (ref 0.20–1.20)

## 2013-01-31 LAB — CBC WITH DIFFERENTIAL/PLATELET
BASO%: 0.8 % (ref 0.0–2.0)
MCHC: 34 g/dL (ref 32.0–36.0)
MONO#: 1.3 10*3/uL — ABNORMAL HIGH (ref 0.1–0.9)
RBC: 3.79 10*6/uL — ABNORMAL LOW (ref 4.20–5.82)
WBC: 7.4 10*3/uL (ref 4.0–10.3)
lymph#: 1.1 10*3/uL (ref 0.9–3.3)

## 2013-01-31 LAB — LACTATE DEHYDROGENASE (CC13): LDH: 141 U/L (ref 125–245)

## 2013-02-01 LAB — IGG: IgG (Immunoglobin G), Serum: 1780 mg/dL — ABNORMAL HIGH (ref 650–1600)

## 2013-02-01 LAB — KAPPA/LAMBDA LIGHT CHAINS
Kappa:Lambda Ratio: 2.75 — ABNORMAL HIGH (ref 0.26–1.65)
Lambda Free Lght Chn: 4.4 mg/dL — ABNORMAL HIGH (ref 0.57–2.63)

## 2013-02-06 ENCOUNTER — Other Ambulatory Visit: Payer: Self-pay | Admitting: Medical Oncology

## 2013-02-06 DIAGNOSIS — C9000 Multiple myeloma not having achieved remission: Secondary | ICD-10-CM

## 2013-02-07 ENCOUNTER — Other Ambulatory Visit (HOSPITAL_BASED_OUTPATIENT_CLINIC_OR_DEPARTMENT_OTHER): Payer: Medicare Other | Admitting: Lab

## 2013-02-07 ENCOUNTER — Ambulatory Visit: Payer: Medicare Other

## 2013-02-07 ENCOUNTER — Ambulatory Visit (HOSPITAL_BASED_OUTPATIENT_CLINIC_OR_DEPARTMENT_OTHER): Payer: Medicare Other | Admitting: Hematology and Oncology

## 2013-02-07 ENCOUNTER — Telehealth: Payer: Self-pay | Admitting: Hematology and Oncology

## 2013-02-07 ENCOUNTER — Other Ambulatory Visit: Payer: Self-pay | Admitting: Medical Oncology

## 2013-02-07 VITALS — BP 153/63 | HR 74 | Temp 98.2°F | Resp 20 | Ht 68.0 in | Wt 194.2 lb

## 2013-02-07 DIAGNOSIS — C9000 Multiple myeloma not having achieved remission: Secondary | ICD-10-CM

## 2013-02-07 LAB — COMPREHENSIVE METABOLIC PANEL (CC13)
ALT: 21 U/L (ref 0–55)
CO2: 25 mEq/L (ref 22–29)
Calcium: 9 mg/dL (ref 8.4–10.4)
Chloride: 105 mEq/L (ref 98–109)
Glucose: 196 mg/dl — ABNORMAL HIGH (ref 70–140)
Sodium: 139 mEq/L (ref 136–145)
Total Protein: 7.5 g/dL (ref 6.4–8.3)

## 2013-02-07 LAB — CBC WITH DIFFERENTIAL/PLATELET
BASO%: 1.2 % (ref 0.0–2.0)
Eosinophils Absolute: 0.1 10*3/uL (ref 0.0–0.5)
HCT: 36.3 % — ABNORMAL LOW (ref 38.4–49.9)
MCHC: 33.3 g/dL (ref 32.0–36.0)
MONO#: 1 10*3/uL — ABNORMAL HIGH (ref 0.1–0.9)
NEUT#: 4 10*3/uL (ref 1.5–6.5)
NEUT%: 60.9 % (ref 39.0–75.0)
WBC: 6.5 10*3/uL (ref 4.0–10.3)
lymph#: 1.3 10*3/uL (ref 0.9–3.3)

## 2013-02-07 MED ORDER — LENALIDOMIDE 5 MG PO CAPS: ORAL_CAPSULE | ORAL | Status: DC

## 2013-02-07 NOTE — Progress Notes (Signed)
CC:   [name], M.D. [name] Section, M.D.  PROBLEM LIST:  1.IgG kappa multiple myeloma diagnosed in February 2011. Bone marrow  aspirate and biopsy on 09/19/2009 showed 48% plasma cells. IgG level at  that time was 4910. Serum kappa light chains were 3.90. Beta 2  microglobulin was 3.77. Metastatic bone survey from 09/05/2009 showed  no definite lytic lesions. The patient received treatment with  subcutaneous Velcade along with melphalan and prednisone from April 2011  through March 2012 with an excellent response to treatment. The patient  then was on Revlimid with weekly Decadron. These treatments were  apparently started in late January 2013. The patient currently is on  Revlimid 10 mg daily for 2 weeks out of every 3 weeks.  2. Myocardial infarction with cardiac catheterization and stent placement in 1979.  3. Hypertension.  4. Renal insufficiency.  5. Diverticulitis without diverticulosis.  6. GERD.  7. Chronic back pain.  8. Sleep apnea with CPAP.  9. TIA.  10. History of colonic polyps.  11. Left total knee replacement by Dr. Salvatore Marvel on 06/28/2011.  12. Bilateral inguinal hernia seen on CT scan from 06/20/2012.  13. Right leg numbness. 14. Systolic ejection murmur. 15. Diarrhea alternating with constipation, possibly related to Revlimid.   MEDICATIONS:  Reviewed and recorded. Current Outpatient Prescriptions  Medication Sig Dispense Refill  . amLODipine (NORVASC) 5 MG tablet Take 5 mg by mouth daily.        Marland Kitchen aspirin 325 MG EC tablet Take 325 mg by mouth daily.        . cetirizine-pseudoephedrine (ZYRTEC-D) 5-120 MG per tablet Take 1 tablet by mouth 2 (two) times daily.       . clopidogrel (PLAVIX) 75 MG tablet Take 75 mg by mouth daily.       . flunisolide (NASAREL) 29 MCG/ACT (0.025%) nasal spray Place 2 sprays into the nose at bedtime. Dose is for each nostril.       Marland Kitchen gabapentin (NEURONTIN) 100 MG capsule Take 100 mg by mouth Nightly. 2 tabs      .  lenalidomide (REVLIMID) 10 MG capsule Take one capsule daily for 14 days, then 7days rest.  14 capsule  0  . metoprolol (TOPROL-XL) 50 MG 24 hr tablet Take 50 mg by mouth daily.        . Multiple Vitamins-Minerals (MULTIVITAMINS THER. W/MINERALS) TABS Take 1 tablet by mouth daily. Centrum silver      . nitroGLYCERIN (NITROSTAT) 0.4 MG SL tablet Place 0.4 mg under the tongue every 5 (five) minutes as needed. For chest pain       . omeprazole (PRILOSEC) 20 MG capsule Take 20 mg by mouth daily.        . simvastatin (ZOCOR) 20 MG tablet Take 20 mg by mouth at bedtime.        . valsartan (DIOVAN) 320 MG tablet Take 320 mg by mouth daily.       . Vitamin D, Ergocalciferol, (DRISDOL) 50000 UNITS CAPS Take 50,000 Units by mouth every 14 (fourteen) days.        No current facility-administered medications for this visit.    TREATMENT PROGRAM:   Revlimid currently 10 mg daily for 2 weeks out of every 3 weeks. Revlimid was started in late January 2013.   IMMUNIZATIONS: The patient received influenza shot on 05/02/2012.   SMOKING HISTORY: Patient has never smoked cigarettes.   HISTORY:  John Carter was seen today for followup of his IgG kappa multiple myeloma  with diagnosis going back to February 2011.  The patient is here today with his wife John Carter who goes by the name of John Carter. The patient is on Revlimid 10 mg for 2 weeks out of every 3 weeks.   He is on the week of currently and will start his new cycle on 02/03/2013.  The patient's main problem seems to be alternating loose stools and constipation which he says dates back to the time he started taking Revlimid back in January 2013.  The patient does use Imodium, but he says this tends to constipate him.  He says that loose stools tend to come after he has had constipation.  He has been having some loose stools this week, but has not taken any Imodium.  Certainly, it is possible that the Revlimid may be playing a role in his bowel  problems.  The patient also was having some arthritic pain involving his right knee.   Blood pressure 153/63, pulse 74, temperature 98.2 F (36.8 C), temperature source Oral, resp. rate 20, height 5\' 8"  (1.727 m), weight 194 lb 3.2 oz (88.089 kg).  PHYSICAL EXAMINATION:  General:  The patient is elderly and frail.  He walks very stooped over.  HEENT:  There is no scleral icterus.  Mouth and pharynx are benign.  There is no peripheral adenopathy palpable.  Lungs:  Clear.  Cardiac:  Regular rhythm with systolic ejection murmur.  Abdomen:  Soft, nontender, with diastasis recti.  No organomegaly or masses.  Extremities:  1 to 2+ edema of both ankles which are somewhat tight.  He has degenerative changes in his fingers.  LABORATORY DATA:  IgG level is 1780 on 01/31/2013, it was 1640 and 1470 on 09/26/2012.    Kappa free light chain is 12.1 on 01/31/2013 and was 9.32 with a kappa-to-lambda ratio of 1.97.  On 09/26/2012 the lambda light chain was 4.53 and it is 4.40 on 01/31/2013 with a kappa-to-lambda ratio of 2.75, was 1.44.     IMAGING STUDIES:  1. Diagnostic chest x-ray, 2 view on 06/22/2011 showed no active cardiopulmonary abnormalities.  2. Diagnostic metastatic bone survey on 12/30/2011 showed no lytic lesions within the skull, cervical spine, lumbar spine, or thoracic spine to suggest myeloma recurrence/progression. Additionally no lytic lesions within the pelvis, femurs, humeri, or ribs.  3. CT of the abdomen and pelvis with contrast on 06/20/2012 showed no acute finding or finding to explain the patient's symptoms.  Diverticulosis without diverticulitis. Extensive atherosclerotic vascular disease including calcific coronary atherosclerosis. Bilateral fat containing inguinal hernias, larger on the right. Fatty infiltration of liver.   IMPRESSION AND PLAN:  John Carter seems to be more or less stable; however, his myeloma markers seem to be slowly rising as shown above.   He is on  Revlimid 10 mg for 2 week on and one week off. I suggested to John Carter to increase the dose to 15 mg and if he tolerated ok, we will try to keep him on 21ys on and one week off. For now we will increase the dose to 15 mg daily for 2 weeks on and one week off. Patient will return to clinic in 4 weeks with myeloma markers and then decide the next step.   Zachery Dakins, MD 02/07/2013 5:18 PM

## 2013-02-07 NOTE — Telephone Encounter (Signed)
gave pt appt for alb and MD August 2014

## 2013-02-08 ENCOUNTER — Ambulatory Visit: Payer: Medicare Other

## 2013-02-08 ENCOUNTER — Other Ambulatory Visit: Payer: Medicare Other | Admitting: Lab

## 2013-02-08 LAB — IGG: IgG (Immunoglobin G), Serum: 1730 mg/dL — ABNORMAL HIGH (ref 650–1600)

## 2013-02-26 ENCOUNTER — Other Ambulatory Visit: Payer: Self-pay | Admitting: *Deleted

## 2013-02-26 NOTE — Telephone Encounter (Signed)
THIS REFILL REQUEST FOR REVLIMID WAS GIVEN TO DR.SALEM'S NURSE, ROBIN BASS,RN. 

## 2013-02-28 ENCOUNTER — Telehealth: Payer: Self-pay | Admitting: Hematology and Oncology

## 2013-03-01 ENCOUNTER — Encounter: Payer: Self-pay | Admitting: Medical Oncology

## 2013-03-01 ENCOUNTER — Other Ambulatory Visit: Payer: Self-pay | Admitting: Medical Oncology

## 2013-03-01 ENCOUNTER — Other Ambulatory Visit: Payer: Self-pay

## 2013-03-01 DIAGNOSIS — C9000 Multiple myeloma not having achieved remission: Secondary | ICD-10-CM

## 2013-03-01 MED ORDER — LENALIDOMIDE 5 MG PO CAPS: ORAL_CAPSULE | ORAL | Status: DC

## 2013-03-01 MED ORDER — LENALIDOMIDE 10 MG PO CAPS: ORAL_CAPSULE | ORAL | Status: DC

## 2013-03-09 ENCOUNTER — Ambulatory Visit: Payer: Medicare Other

## 2013-03-09 ENCOUNTER — Other Ambulatory Visit: Payer: Medicare Other | Admitting: Lab

## 2013-03-13 ENCOUNTER — Other Ambulatory Visit (HOSPITAL_BASED_OUTPATIENT_CLINIC_OR_DEPARTMENT_OTHER): Payer: Medicare Other | Admitting: Lab

## 2013-03-13 ENCOUNTER — Ambulatory Visit (HOSPITAL_BASED_OUTPATIENT_CLINIC_OR_DEPARTMENT_OTHER): Payer: Medicare Other | Admitting: Hematology and Oncology

## 2013-03-13 ENCOUNTER — Telehealth: Payer: Self-pay | Admitting: Hematology and Oncology

## 2013-03-13 VITALS — BP 149/65 | HR 88 | Temp 97.9°F | Resp 18 | Ht 68.0 in | Wt 197.2 lb

## 2013-03-13 DIAGNOSIS — C9000 Multiple myeloma not having achieved remission: Secondary | ICD-10-CM

## 2013-03-13 DIAGNOSIS — D472 Monoclonal gammopathy: Secondary | ICD-10-CM

## 2013-03-13 LAB — CBC WITH DIFFERENTIAL/PLATELET
Basophils Absolute: 0 10*3/uL (ref 0.0–0.1)
Eosinophils Absolute: 0.3 10*3/uL (ref 0.0–0.5)
HGB: 12.1 g/dL — ABNORMAL LOW (ref 13.0–17.1)
LYMPH%: 16.2 % (ref 14.0–49.0)
MCV: 96.4 fL (ref 79.3–98.0)
MONO%: 18.8 % — ABNORMAL HIGH (ref 0.0–14.0)
NEUT#: 5.1 10*3/uL (ref 1.5–6.5)
NEUT%: 60.9 % (ref 39.0–75.0)
Platelets: 64 10*3/uL — ABNORMAL LOW (ref 140–400)

## 2013-03-13 LAB — COMPREHENSIVE METABOLIC PANEL (CC13)
BUN: 22 mg/dL (ref 7.0–26.0)
CO2: 24 mEq/L (ref 22–29)
Creatinine: 1.1 mg/dL (ref 0.7–1.3)
Glucose: 143 mg/dl — ABNORMAL HIGH (ref 70–140)
Total Bilirubin: 0.44 mg/dL (ref 0.20–1.20)
Total Protein: 7.4 g/dL (ref 6.4–8.3)

## 2013-03-13 NOTE — Telephone Encounter (Signed)
Gave pt appt for lab and MD on September 2014 °

## 2013-03-15 LAB — SPEP & IFE WITH QIG
Alpha-1-Globulin: 4.6 % (ref 2.9–4.9)
Alpha-2-Globulin: 10.2 % (ref 7.1–11.8)
Beta Globulin: 6.3 % (ref 4.7–7.2)
Total Protein, Serum Electrophoresis: 7.2 g/dL (ref 6.0–8.3)

## 2013-03-15 LAB — KAPPA/LAMBDA LIGHT CHAINS
Kappa free light chain: 11.6 mg/dL — ABNORMAL HIGH (ref 0.33–1.94)
Lambda Free Lght Chn: 4.7 mg/dL — ABNORMAL HIGH (ref 0.57–2.63)

## 2013-03-20 ENCOUNTER — Other Ambulatory Visit: Payer: Self-pay | Admitting: *Deleted

## 2013-03-20 ENCOUNTER — Telehealth: Payer: Self-pay

## 2013-03-20 MED ORDER — LENALIDOMIDE 15 MG PO CAPS: 15.0000 mg | ORAL_CAPSULE | Freq: Every day | ORAL | Status: DC

## 2013-03-20 NOTE — Telephone Encounter (Signed)
Pt lvm that he needs his revlimid by Friday. Called [facility] to get request faxed to [place].

## 2013-03-20 NOTE — Telephone Encounter (Signed)
THIS REFILL REQUEST FOR REVLIMID WAS PLACED ON DR.ROSTAPSHOV'S NURSE'S DESK.

## 2013-03-21 ENCOUNTER — Encounter: Payer: Self-pay | Admitting: Hematology and Oncology

## 2013-03-21 ENCOUNTER — Other Ambulatory Visit: Payer: Self-pay | Admitting: Medical Oncology

## 2013-03-21 NOTE — Progress Notes (Signed)
Called [facility], [phone], about revlimid pa; per rep the patient's authorization does not end until 05/03/13; his id # is [id number].

## 2013-03-22 ENCOUNTER — Telehealth: Payer: Self-pay | Admitting: Medical Oncology

## 2013-03-22 NOTE — Progress Notes (Signed)
ID: John Carter  DOB: [birth year]  MR#: [id number]  CSN#: [id number]  [facility]  [phone] [phone]   OFFICE PROGRESS NOTE     PROBLEM LIST:  1.IgG kappa multiple myeloma diagnosed in February 2011. Bone marrow  aspirate and biopsy on 09/19/2009 showed 48% plasma cells. IgG level at  that time was 4910. Serum kappa light chains were 3.90. Beta 2  microglobulin was 3.77. Metastatic bone survey from 09/05/2009 showed  no definite lytic lesions. The patient received treatment with  subcutaneous Velcade along with melphalan and prednisone from April 2011  through March 2012 with an excellent response to treatment. The patient  then was on Revlimid with weekly Decadron. These treatments were  apparently started in late January 2013. The patient currently is on  Revlimid 10 mg daily for 2 weeks out of every 3 weeks.  2. Myocardial infarction with cardiac catheterization and stent placement in 1979.  3. Hypertension.  4. Renal insufficiency.  5. Diverticulitis without diverticulosis.  6. GERD.  7. Chronic back pain.  8. Sleep apnea with CPAP.  9. TIA.  10. History of colonic polyps.  11. Left total knee replacement by Dr. Salvatore Marvel on 06/28/2011.  12. Bilateral inguinal hernia seen on CT scan from 06/20/2012.  13. Right leg numbness.  14. Systolic ejection murmur.  15. Diarrhea alternating with constipation, possibly related to Revlimid.    INTERVAL HISTORY: John Carter is 79 y.o. Man who presented for follow up visit. His appetite is good. He complains on fatigue, be weak, sweaty, having diarrhea, clear nasal discharge arthratis in right knee The patient denied fever, chills, night sweats, change in appetite or weight. He denied headaches, double vision, blurry vision, nasal congestion, hearing problems, odynophagia or dysphagia. No chest pain, palpitations,  cough, abdominal pain, nausea, vomiting, constipation, hematochezia. The  patient denied dysuria, nocturia, polyuria, hematuria, myalgia, numbness, tingling, psychiatric problems.  Review of Systems  Constitutional: Positive for malaise/fatigue and diaphoresis. Negative for fever, chills and weight loss.  HENT: Negative for hearing loss, ear pain, nosebleeds, congestion, sore throat, neck pain and tinnitus.   Eyes: Negative for blurred vision, double vision, photophobia and pain.  Respiratory: Positive for shortness of breath. Negative for cough, hemoptysis, sputum production and wheezing.   Cardiovascular: Negative for chest pain, palpitations, orthopnea, claudication, leg swelling and PND.  Gastrointestinal: Positive for diarrhea. Negative for heartburn, nausea, vomiting, abdominal pain, constipation, blood in stool and melena.  Genitourinary: Negative for dysuria, urgency, frequency, hematuria and flank pain.  Musculoskeletal: Positive for joint pain. Negative for myalgias, back pain and falls.  Skin: Negative for itching and rash.  Neurological: Positive for weakness. Negative for dizziness, tingling, tremors, sensory change, speech change, focal weakness, seizures, loss of consciousness and headaches.  Endo/Heme/Allergies: Does not bruise/bleed easily.  Psychiatric/Behavioral: Negative.     Allergies  Allergen Reactions  . Sulfa Drugs Cross Reactors Rash    Current Outpatient Prescriptions  Medication Sig Dispense Refill  . amLODipine (NORVASC) 5 MG tablet Take 5 mg by mouth daily.        Marland Kitchen aspirin 325 MG EC tablet Take 325 mg by mouth daily.        . cetirizine-pseudoephedrine (ZYRTEC-D) 5-120 MG per tablet Take 1 tablet by mouth 2 (two) times daily.       . clopidogrel (PLAVIX) 75 MG tablet Take 75 mg by mouth daily.       . flunisolide (NASAREL) 29 MCG/ACT (0.025%) nasal spray Place 2 sprays into  the nose at bedtime. Dose is for each nostril.       Marland Kitchen gabapentin (NEURONTIN) 300 MG capsule Take 300 mg by mouth at bedtime.      . metoprolol (TOPROL-XL)  50 MG 24 hr tablet Take 50 mg by mouth daily.        . Multiple Vitamins-Minerals (MULTIVITAMINS THER. W/MINERALS) TABS Take 1 tablet by mouth daily. Centrum silver      . nitroGLYCERIN (NITROSTAT) 0.4 MG SL tablet Place 0.4 mg under the tongue every 5 (five) minutes as needed. For chest pain       . omeprazole (PRILOSEC) 20 MG capsule Take 20 mg by mouth daily.        . simvastatin (ZOCOR) 20 MG tablet Take 20 mg by mouth at bedtime.        . valsartan (DIOVAN) 320 MG tablet Take 320 mg by mouth daily.       . Vitamin D, Ergocalciferol, (DRISDOL) 50000 UNITS CAPS Take 50,000 Units by mouth every 14 (fourteen) days.       Marland Kitchen lenalidomide (REVLIMID) 15 MG capsule Take 1 capsule (15 mg total) by mouth daily. Daily x 14 days on and 7 days off  14 capsule  0   No current facility-administered medications for this visit.    Past Medical History  Diagnosis Date  . Angina   . Stroke   . Multiple myeloma 06/2009    was on chemo  . Melanoma   . Hypertension     amlodipine and metoprolol  . Hyperlipidemia     takes Zocor daily  . Coronary artery disease     2 stents placed in 2001  . Myocardial infarction 2001  . Sleep apnea     sleep study done 40yrs ago and pt does use a CPAP;setting of 10  . TIA (transient ischemic attack) 2007  . Arthritis     left knee  . Chronic back pain     compression fracture,degenerative disc disease  . Bruises easily     pt is on Plavix and Aspirin  . GERD (gastroesophageal reflux disease)     takes Omeprazole daily  . Hiatal hernia   . Gastric ulcer   . Diverticulosis   . Hemorrhoids   . History of colonic polyps   . Urinary frequency   . Nocturia   . Leg cramps   . Cataracts, bilateral   . Hiatal hernia     Past Surgical History  Procedure Laterality Date  . Back surgery  80's  . Tonsillectomy      as a  child  . Colonoscopy    . Appendectomy      as a child  . Knee surgery  1997    left knee arthroscopy  . Knee arthroscopy  1999     right  . Coronary angioplasty  09/04/1999  . Total knee arthroplasty  06/28/2011    Procedure: TOTAL KNEE ARTHROPLASTY;  Surgeon: Nilda Simmer, MD;  Location: [facility] OR;  Service: Orthopedics;  Laterality: Left;  TOTAL KNEE ARTHROPLASTY LEFT SIDE      Objective:  Filed Vitals:   03/13/13 1405  BP: 149/65  Pulse: 88  Temp: 97.9 F (36.6 C)  Resp: 18    BMI: Body mass index is 29.99 kg/(m^2).     Physical Exam:   Sclerae unicteric  Oropharynx clear  No peripheral adenopathy  Lungs clear -- no rales or rhonchi  Heart regular rate and rhythm. 1/6 short systolic murmur  Abdomen benign  MSK no focal spinal tenderness, no peripheral edema  Neuro nonfocal  Lab Results:      Chemistry      Component Value Date/Time   NA 142 03/13/2013 1332   NA 140 03/07/2012 1043   K 3.7 03/13/2013 1332   K 4.4 03/07/2012 1043   CL 106 12/05/2012 0918   CL 106 03/07/2012 1043   CO2 24 03/13/2013 1332   CO2 26 03/07/2012 1043   BUN 22.0 03/13/2013 1332   BUN 24* 03/07/2012 1043   CREATININE 1.1 03/13/2013 1332   CREATININE 1.08 03/07/2012 1043      Component Value Date/Time   CALCIUM 8.6 03/13/2013 1332   CALCIUM 9.0 03/07/2012 1043   ALKPHOS 106 03/13/2013 1332   ALKPHOS 94 03/07/2012 1043   AST 17 03/13/2013 1332   AST 16 03/07/2012 1043   ALT 23 03/13/2013 1332   ALT 19 03/07/2012 1043   BILITOT 0.44 03/13/2013 1332   BILITOT 0.3 03/07/2012 1043       Lab Results  Component Value Date   WBC 8.4 03/13/2013   HGB 12.1* 03/13/2013   HCT 37.1* 03/13/2013   MCV 96.4 03/13/2013   PLT 64* 03/13/2013   NEUTROABS 5.1 03/13/2013    Studies/Results:  No results found.  Assessment:/Plan:    Multiple myeloma..  John Carter is stable; however, his myeloma markers are stable. We will proceed with another cycle of Revlimid 15 mg 21 days on 7 days of Patient will return to clinic in 4 weeks.    Oline Belk, Alecia Lemming 03/22/2013

## 2013-03-22 NOTE — Telephone Encounter (Signed)
I called pt this am to see if he has heard from his revlimid shipment. I spent 45 minutes last pm with [facility] regarding insurance issues. He states that he did receive a call this am and it will be shipped today.

## 2013-04-05 ENCOUNTER — Other Ambulatory Visit: Payer: Self-pay | Admitting: *Deleted

## 2013-04-05 NOTE — Telephone Encounter (Signed)
THIS REFILL REQUEST FOR REVLIMID WAS GIVEN TO DR.GORSUCH'S NURSE, CAMEO PORTER,RN. 

## 2013-04-07 ENCOUNTER — Telehealth: Payer: Self-pay | Admitting: *Deleted

## 2013-04-07 NOTE — Telephone Encounter (Signed)
sw pt informed the pt that his appt for 04/13/13 had to be rs. gv appt for 04/16/13 @ 2pm. Pt is aware and is also aware of his labs on 04/09/13 @ 10:45am...td

## 2013-04-09 ENCOUNTER — Other Ambulatory Visit (HOSPITAL_BASED_OUTPATIENT_CLINIC_OR_DEPARTMENT_OTHER): Payer: Medicare Other

## 2013-04-09 ENCOUNTER — Other Ambulatory Visit: Payer: Self-pay | Admitting: Medical Oncology

## 2013-04-09 DIAGNOSIS — C9000 Multiple myeloma not having achieved remission: Secondary | ICD-10-CM

## 2013-04-09 LAB — CBC WITH DIFFERENTIAL/PLATELET
BASO%: 0.8 % (ref 0.0–2.0)
EOS%: 2.2 % (ref 0.0–7.0)
HCT: 35.3 % — ABNORMAL LOW (ref 38.4–49.9)
MCH: 31.8 pg (ref 27.2–33.4)
MCHC: 33.2 g/dL (ref 32.0–36.0)
MCV: 95.8 fL (ref 79.3–98.0)
MONO#: 1.6 10*3/uL — ABNORMAL HIGH (ref 0.1–0.9)
MONO%: 20.6 % — ABNORMAL HIGH (ref 0.0–14.0)
RBC: 3.68 10*6/uL — ABNORMAL LOW (ref 4.20–5.82)
RDW: 15.1 % — ABNORMAL HIGH (ref 11.0–14.6)
lymph#: 1.3 10*3/uL (ref 0.9–3.3)

## 2013-04-09 LAB — COMPREHENSIVE METABOLIC PANEL (CC13)
ALT: 16 U/L (ref 0–55)
AST: 13 U/L (ref 5–34)
Albumin: 3 g/dL — ABNORMAL LOW (ref 3.5–5.0)
Alkaline Phosphatase: 102 U/L (ref 40–150)
BUN: 19.7 mg/dL (ref 7.0–26.0)
Calcium: 8.7 mg/dL (ref 8.4–10.4)
Chloride: 107 mEq/L (ref 98–109)
Creatinine: 1.1 mg/dL (ref 0.7–1.3)
Potassium: 3.9 mEq/L (ref 3.5–5.1)
Sodium: 141 mEq/L (ref 136–145)
Total Protein: 7.4 g/dL (ref 6.4–8.3)

## 2013-04-11 LAB — KAPPA/LAMBDA LIGHT CHAINS
Kappa free light chain: 7.54 mg/dL — ABNORMAL HIGH (ref 0.33–1.94)
Kappa:Lambda Ratio: 1.99 — ABNORMAL HIGH (ref 0.26–1.65)
Lambda Free Lght Chn: 3.79 mg/dL — ABNORMAL HIGH (ref 0.57–2.63)

## 2013-04-11 LAB — PROTEIN ELECTROPHORESIS, SERUM
Albumin ELP: 49.4 % — ABNORMAL LOW (ref 55.8–66.1)
Alpha-1-Globulin: 5.4 % — ABNORMAL HIGH (ref 2.9–4.9)
Total Protein, Serum Electrophoresis: 6.9 g/dL (ref 6.0–8.3)

## 2013-04-11 LAB — IGG: IgG (Immunoglobin G), Serum: 1550 mg/dL (ref 650–1600)

## 2013-04-13 ENCOUNTER — Ambulatory Visit: Payer: Medicare Other

## 2013-04-16 ENCOUNTER — Other Ambulatory Visit: Payer: Self-pay | Admitting: *Deleted

## 2013-04-16 ENCOUNTER — Ambulatory Visit (HOSPITAL_BASED_OUTPATIENT_CLINIC_OR_DEPARTMENT_OTHER): Payer: Medicare Other | Admitting: Hematology and Oncology

## 2013-04-16 ENCOUNTER — Encounter: Payer: Self-pay | Admitting: Hematology and Oncology

## 2013-04-16 ENCOUNTER — Telehealth: Payer: Self-pay | Admitting: Hematology and Oncology

## 2013-04-16 VITALS — BP 167/80 | HR 80 | Temp 97.9°F | Resp 18 | Ht 68.0 in | Wt 195.8 lb

## 2013-04-16 DIAGNOSIS — C9000 Multiple myeloma not having achieved remission: Secondary | ICD-10-CM

## 2013-04-16 DIAGNOSIS — D61818 Other pancytopenia: Secondary | ICD-10-CM

## 2013-04-16 DIAGNOSIS — Z23 Encounter for immunization: Secondary | ICD-10-CM

## 2013-04-16 HISTORY — DX: Other pancytopenia: D61.818

## 2013-04-16 MED ORDER — DEXAMETHASONE 4 MG PO TABS: 8.0000 mg | ORAL_TABLET | ORAL | Status: DC

## 2013-04-16 MED ORDER — LENALIDOMIDE 10 MG PO CAPS: 10.0000 mg | ORAL_CAPSULE | Freq: Every day | ORAL | Status: DC

## 2013-04-16 MED ORDER — INFLUENZA VAC SPLIT QUAD 0.5 ML IM SUSP
0.5000 mL | Freq: Once | INTRAMUSCULAR | Status: AC
  Administered 2013-04-16: 0.5 mL via INTRAMUSCULAR
  Filled 2013-04-16: qty 0.5

## 2013-04-16 NOTE — Telephone Encounter (Signed)
Patient rec'd dose reduction today due to decreasing blood counts. New Rx and survey faxed to [facility] . Auth # [id number] Patient notified that new patient survey required for new Rx.

## 2013-04-16 NOTE — Progress Notes (Signed)
[facility] OFFICE PROGRESS NOTE  Darnelle Bos, MD  DIAGNOSIS: IgG kappa multiple myeloma   SUMMARY OF ONCOLOGIC HISTORY: IgG kappa multiple myeloma diagnosed in February 2011. Bone marrow aspirate and biopsy on 09/19/2009 showed 48% plasma cells. IgG level at that time was 4910. Serum kappa light chains were 3.90. Beta 2 microglobulin was 3.77. Metastatic bone survey from 09/05/2009 showed no definite lytic lesions. The patient received treatment with subcutaneous Velcade along with melphalan and prednisone from April 2011 through March 2012 with an excellent response to treatment. The patient then was on Revlimid with weekly Decadron. These treatments were apparently started in late January 2013. The patient currently is on Revlimid 10 mg daily for 2 weeks out of every 3 weeks. Due to elevated light chains, the dose of Revlimid was increased again recently.  INTERVAL HISTORY: John Carter 79 y.o. male returns for follow up. He denies any bleeding or bruising. No recent fever, chills, or cough. No new areas of bone pain.2  I have reviewed the past medical history, past surgical history, social history and family history with the patient and they are unchanged from previous note.  ALLERGIES:  is allergic to sulfa drugs cross reactors.  MEDICATIONS: Current outpatient prescriptions:amLODipine (NORVASC) 5 MG tablet, Take 5 mg by mouth daily.  , Disp: , Rfl: ;  aspirin 325 MG EC tablet, Take 325 mg by mouth daily.  , Disp: , Rfl: ;  cetirizine-pseudoephedrine (ZYRTEC-D) 5-120 MG per tablet, Take 1 tablet by mouth 2 (two) times daily. , Disp: , Rfl: ;  clopidogrel (PLAVIX) 75 MG tablet, Take 75 mg by mouth daily. , Disp: , Rfl:  flunisolide (NASAREL) 29 MCG/ACT (0.025%) nasal spray, Place 2 sprays into the nose at bedtime. Dose is for each nostril. , Disp: , Rfl: ;  gabapentin (NEURONTIN) 300 MG capsule, Take 300 mg by mouth at bedtime., Disp: , Rfl: ;  metoprolol  (TOPROL-XL) 50 MG 24 hr tablet, Take 50 mg by mouth daily.  , Disp: , Rfl: ;  Multiple Vitamins-Minerals (MULTIVITAMINS THER. W/MINERALS) TABS, Take 1 tablet by mouth daily. Centrum silver, Disp: , Rfl:  nitroGLYCERIN (NITROSTAT) 0.4 MG SL tablet, Place 0.4 mg under the tongue every 5 (five) minutes as needed. For chest pain , Disp: , Rfl: ;  omeprazole (PRILOSEC) 20 MG capsule, Take 20 mg by mouth daily.  , Disp: , Rfl: ;  simvastatin (ZOCOR) 20 MG tablet, Take 20 mg by mouth at bedtime.  , Disp: , Rfl: ;  valsartan (DIOVAN) 320 MG tablet, Take 320 mg by mouth daily. , Disp: , Rfl:  Vitamin D, Ergocalciferol, (DRISDOL) 50000 UNITS CAPS, Take 50,000 Units by mouth every 14 (fourteen) days. , Disp: , Rfl: ;  [START ON 04/17/2013] dexamethasone (DECADRON) 4 MG tablet, Take 2 tablets (8 mg total) by mouth every 7 (seven) days. Take 2 tabs every Tuesday am with breakfast, Disp: 30 tablet, Rfl: 1;  [START ON 05/07/2013] lenalidomide (REVLIMID) 10 MG capsule, Take 1 capsule (10 mg total) by mouth daily., Disp: 21 capsule, Rfl: 0  REVIEW OF SYSTEMS:   Constitutional: Denies fevers, chills or abnormal weight loss Eyes: Denies blurriness of vision Ears, nose, mouth, throat, and face: Denies mucositis or sore throat Respiratory: Denies cough, dyspnea or wheezes Cardiovascular: Denies palpitation, chest discomfort or lower extremity swelling Gastrointestinal:  Denies nausea, heartburn or change in bowel habits Skin: Denies abnormal skin rashes Lymphatics: Denies new lymphadenopathy or easy bruising Neurological:Denies numbness, tingling or new weaknesses Behavioral/Psych:  Mood is stable, no new changes  All other systems were reviewed with the patient and are negative.  PHYSICAL EXAMINATION: ECOG PS of 1 Filed Vitals:   04/16/13 1336  BP: 167/80  Pulse: 80  Temp: 97.9 F (36.6 C)  Resp: 18   Filed Weights   04/16/13 1336  Weight: 195 lb 12.8 oz (88.814 kg)    GENERAL:alert, no distress and  comfortable SKIN: skin color, texture, turgor are normal, no rashes or significant lesions EYES: normal, Conjunctiva are pink and non-injected, sclera clear OROPHARYNX:no exudate, no erythema and lips, buccal mucosa, and tongue normal  NECK: supple, thyroid normal size, non-tender, without nodularity LYMPH:  no palpable lymphadenopathy in the cervical, axillary or inguinal LUNGS: clear to auscultation and percussion with normal breathing effort HEART: regular rate & rhythm and no murmurs and no lower extremity edema ABDOMEN:abdomen soft, non-tender and normal bowel sounds Musculoskeletal:no cyanosis of digits and no clubbing  NEURO: alert & oriented x 3 with fluent speech, no focal motor/sensory deficits  LABORATORY DATA:  I have reviewed the data as listed    Component Value Date/Time   NA 141 04/09/2013 1040   NA 140 03/07/2012 1043   K 3.9 04/09/2013 1040   K 4.4 03/07/2012 1043   CL 106 12/05/2012 0918   CL 106 03/07/2012 1043   CO2 24 04/09/2013 1040   CO2 26 03/07/2012 1043   GLUCOSE 146* 04/09/2013 1040   GLUCOSE 152* 12/05/2012 0918   GLUCOSE 120* 03/07/2012 1043   BUN 19.7 04/09/2013 1040   BUN 24* 03/07/2012 1043   CREATININE 1.1 04/09/2013 1040   CREATININE 1.08 03/07/2012 1043   CALCIUM 8.7 04/09/2013 1040   CALCIUM 9.0 03/07/2012 1043   PROT 7.4 04/09/2013 1040   PROT 6.7 03/07/2012 1043   ALBUMIN 3.0* 04/09/2013 1040   ALBUMIN 3.8 03/07/2012 1043   AST 13 04/09/2013 1040   AST 16 03/07/2012 1043   ALT 16 04/09/2013 1040   ALT 19 03/07/2012 1043   ALKPHOS 102 04/09/2013 1040   ALKPHOS 94 03/07/2012 1043   BILITOT 0.38 04/09/2013 1040   BILITOT 0.3 03/07/2012 1043   GFRNONAA 65* 07/01/2011 0615   GFRAA 75* 07/01/2011 0615    I No results found for this basename: SPEP, UPEP,  kappa and lambda light chains    Lab Results  Component Value Date   WBC 7.8 04/09/2013   NEUTROABS 4.6 04/09/2013   HGB 11.7* 04/09/2013   HCT 35.3* 04/09/2013   MCV 95.8 04/09/2013   PLT 56* 04/09/2013       Chemistry      Component Value Date/Time   NA 141 04/09/2013 1040   NA 140 03/07/2012 1043   K 3.9 04/09/2013 1040   K 4.4 03/07/2012 1043   CL 106 12/05/2012 0918   CL 106 03/07/2012 1043   CO2 24 04/09/2013 1040   CO2 26 03/07/2012 1043   BUN 19.7 04/09/2013 1040   BUN 24* 03/07/2012 1043   CREATININE 1.1 04/09/2013 1040   CREATININE 1.08 03/07/2012 1043      Component Value Date/Time   CALCIUM 8.7 04/09/2013 1040   CALCIUM 9.0 03/07/2012 1043   ALKPHOS 102 04/09/2013 1040   ALKPHOS 94 03/07/2012 1043   AST 13 04/09/2013 1040   AST 16 03/07/2012 1043   ALT 16 04/09/2013 1040   ALT 19 03/07/2012 1043   BILITOT 0.38 04/09/2013 1040   BILITOT 0.3 03/07/2012 1043    ASSESSMENT:IgG multiple myeloma on treatment with Revlimid  PLAN:  1) Multiple Myeloma He had better response on higher dose Revlimid at the expense of worsening pancytopenia. I have asked him to take current dose Revlimid at every other day until it is finished. I will reduce Revlimid back to 10 mg 21 days on, 7 days off and add back weekly Dexamethasone. He has not been prescribed IV bisphosphonates. I have asked him to schedule an appointment to see his dentist for clearance. I discussed with him risks, benefits and side-effects of IV zometa and he is in agreement to proceed. We will add IV zometa to start with next visit 2) Worsening pancytopenia He is asymptomatic but at severe risk of bleeding with worsening platelet count. Patient is on dual anti-platelet agents for his cardiac condition. As mentioned above, I will reduce the dose of his chemotherapy. 3) Preventive care I recommend influenza vaccination today and we will administer in the office  All questions were answered. The patient knows to call the clinic with any problems, questions or concerns. We can certainly see the patient much sooner if necessary. No barriers to learning was detected.    [facility], Clanton Emanuelson, MD 04/16/2013 9:11 PM

## 2013-04-16 NOTE — Telephone Encounter (Signed)
Gave pt appt for lab and Md on October 20th

## 2013-04-17 ENCOUNTER — Telehealth: Payer: Self-pay

## 2013-04-17 ENCOUNTER — Telehealth: Payer: Self-pay | Admitting: *Deleted

## 2013-04-17 ENCOUNTER — Telehealth: Payer: Self-pay | Admitting: Hematology and Oncology

## 2013-04-17 NOTE — Telephone Encounter (Signed)
Confirmed with John Carter John Carter appointment on 05-07-13.  Lab at 0900, visit at 0930 with John Carter, and then chemo at 1015.  Wife wrote information down and verbalized understanding.  If there are any further questions, told wife that John Carter can call back and speak with scheduling.

## 2013-04-17 NOTE — Telephone Encounter (Signed)
Per staff message and POF I have scheduled appts.  JMW  

## 2013-04-17 NOTE — Telephone Encounter (Signed)
s.w. pt and advised on 10.20.14 appt....pt ok and aware

## 2013-05-07 ENCOUNTER — Other Ambulatory Visit (HOSPITAL_BASED_OUTPATIENT_CLINIC_OR_DEPARTMENT_OTHER): Payer: Medicare Other | Admitting: Lab

## 2013-05-07 ENCOUNTER — Ambulatory Visit (HOSPITAL_BASED_OUTPATIENT_CLINIC_OR_DEPARTMENT_OTHER): Payer: Medicare Other | Admitting: Hematology and Oncology

## 2013-05-07 ENCOUNTER — Ambulatory Visit (HOSPITAL_BASED_OUTPATIENT_CLINIC_OR_DEPARTMENT_OTHER): Payer: Medicare Other

## 2013-05-07 ENCOUNTER — Encounter (INDEPENDENT_AMBULATORY_CARE_PROVIDER_SITE_OTHER): Payer: Self-pay

## 2013-05-07 ENCOUNTER — Ambulatory Visit: Payer: Medicare Other

## 2013-05-07 ENCOUNTER — Encounter: Payer: Self-pay | Admitting: Hematology and Oncology

## 2013-05-07 ENCOUNTER — Telehealth: Payer: Self-pay | Admitting: Hematology and Oncology

## 2013-05-07 ENCOUNTER — Telehealth: Payer: Self-pay | Admitting: *Deleted

## 2013-05-07 VITALS — BP 162/74 | HR 78 | Temp 97.1°F | Resp 20 | Ht 68.0 in | Wt 199.5 lb

## 2013-05-07 DIAGNOSIS — D539 Nutritional anemia, unspecified: Secondary | ICD-10-CM

## 2013-05-07 DIAGNOSIS — D61818 Other pancytopenia: Secondary | ICD-10-CM

## 2013-05-07 DIAGNOSIS — Z23 Encounter for immunization: Secondary | ICD-10-CM

## 2013-05-07 DIAGNOSIS — R197 Diarrhea, unspecified: Secondary | ICD-10-CM

## 2013-05-07 DIAGNOSIS — C9000 Multiple myeloma not having achieved remission: Secondary | ICD-10-CM

## 2013-05-07 LAB — CBC WITH DIFFERENTIAL/PLATELET
Basophils Absolute: 0.1 10*3/uL (ref 0.0–0.1)
EOS%: 3.9 % (ref 0.0–7.0)
Eosinophils Absolute: 0.2 10*3/uL (ref 0.0–0.5)
HCT: 36.7 % — ABNORMAL LOW (ref 38.4–49.9)
HGB: 12 g/dL — ABNORMAL LOW (ref 13.0–17.1)
LYMPH%: 18.6 % (ref 14.0–49.0)
MCH: 31.6 pg (ref 27.2–33.4)
MCHC: 32.7 g/dL (ref 32.0–36.0)
MCV: 96.7 fL (ref 79.3–98.0)
MONO%: 20.3 % — ABNORMAL HIGH (ref 0.0–14.0)
NEUT#: 3.1 10*3/uL (ref 1.5–6.5)
NEUT%: 55.7 % (ref 39.0–75.0)
RDW: 15 % — ABNORMAL HIGH (ref 11.0–14.6)

## 2013-05-07 LAB — COMPREHENSIVE METABOLIC PANEL (CC13)
AST: 12 U/L (ref 5–34)
Alkaline Phosphatase: 93 U/L (ref 40–150)
BUN: 20.7 mg/dL (ref 7.0–26.0)
Creatinine: 1 mg/dL (ref 0.7–1.3)
Total Bilirubin: 0.45 mg/dL (ref 0.20–1.20)

## 2013-05-07 LAB — MAGNESIUM (CC13): Magnesium: 1.4 mg/dl — CL (ref 1.5–2.5)

## 2013-05-07 MED ORDER — PNEUMOCOCCAL VAC POLYVALENT 25 MCG/0.5ML IJ INJ
0.5000 mL | INJECTION | Freq: Once | INTRAMUSCULAR | Status: AC
  Administered 2013-05-07: 0.5 mL via INTRAMUSCULAR
  Filled 2013-05-07: qty 0.5

## 2013-05-07 MED ORDER — ZOLEDRONIC ACID 4 MG/5ML IV CONC
3.5000 mg | Freq: Once | INTRAVENOUS | Status: AC
  Administered 2013-05-07: 3.5 mg via INTRAVENOUS
  Filled 2013-05-07: qty 4.38

## 2013-05-07 MED ORDER — LENALIDOMIDE 10 MG PO CAPS
10.0000 mg | ORAL_CAPSULE | Freq: Every day | ORAL | Status: DC
Start: 2013-05-12 — End: 2013-07-02

## 2013-05-07 MED ORDER — SODIUM CHLORIDE 0.9 % IV SOLN
Freq: Once | INTRAVENOUS | Status: AC
  Administered 2013-05-07: 10:00:00 via INTRAVENOUS

## 2013-05-07 NOTE — Patient Instructions (Signed)
[facility] Discharge Instructions for Patients Receiving Chemotherapy  Today you received Zometa and the Pneumococcal vaccine.    If you develop nausea and vomiting that is not controlled by your nausea medication, call the clinic.   BELOW ARE SYMPTOMS THAT SHOULD BE REPORTED IMMEDIATELY:  *FEVER GREATER THAN 100.5 F  *CHILLS WITH OR WITHOUT FEVER  NAUSEA AND VOMITING THAT IS NOT CONTROLLED WITH YOUR NAUSEA MEDICATION  *UNUSUAL SHORTNESS OF BREATH  *UNUSUAL BRUISING OR BLEEDING  TENDERNESS IN MOUTH AND THROAT WITH OR WITHOUT PRESENCE OF ULCERS  *URINARY PROBLEMS  *BOWEL PROBLEMS  UNUSUAL RASH Items with * indicate a potential emergency and should be followed up as soon as possible.  Feel free to call the clinic you have any questions or concerns. The clinic phone number is [phone].   Zoledronic Acid injection (Hypercalcemia, Oncology) What is this medicine? ZOLEDRONIC ACID (ZOE le dron ik AS id) lowers the amount of calcium loss from bone. It is used to treat too much calcium in your blood from cancer. It is also used to prevent complications of cancer that has spread to the bone. This medicine may be used for other purposes; ask your health care provider or pharmacist if you have questions. What should I tell my health care provider before I take this medicine? They need to know if you have any of these conditions: -aspirin-sensitive asthma -dental disease -kidney disease -an unusual or allergic reaction to zoledronic acid, other medicines, foods, dyes, or preservatives -pregnant or trying to get pregnant -breast-feeding How should I use this medicine? This medicine is for infusion into a vein. It is given by a health care professional in a hospital or clinic setting. Talk to your pediatrician regarding the use of this medicine in children. Special care may be needed. Overdosage: If you think you have taken too much of this medicine contact a  poison control center or emergency room at once. NOTE: This medicine is only for you. Do not share this medicine with others. What if I miss a dose? It is important not to miss your dose. Call your doctor or health care professional if you are unable to keep an appointment. What may interact with this medicine? -certain antibiotics given by injection -NSAIDs, medicines for pain and inflammation, like ibuprofen or naproxen -some diuretics like bumetanide, furosemide -teriparatide -thalidomide This list may not describe all possible interactions. Give your health care provider a list of all the medicines, herbs, non-prescription drugs, or dietary supplements you use. Also tell them if you smoke, drink alcohol, or use illegal drugs. Some items may interact with your medicine. What should I watch for while using this medicine? Visit your doctor or health care professional for regular checkups. It may be some time before you see the benefit from this medicine. Do not stop taking your medicine unless your doctor tells you to. Your doctor may order blood tests or other tests to see how you are doing. Women should inform their doctor if they wish to become pregnant or think they might be pregnant. There is a potential for serious side effects to an unborn child. Talk to your health care professional or pharmacist for more information. You should make sure that you get enough calcium and vitamin D while you are taking this medicine. Discuss the foods you eat and the vitamins you take with your health care professional. Some people who take this medicine have severe bone, joint, and/or muscle pain. This medicine may also increase  your risk for a broken thigh bone. Tell your doctor right away if you have pain in your upper leg or groin. Tell your doctor if you have any pain that does not go away or that gets worse. What side effects may I notice from receiving this medicine? Side effects that you should report  to your doctor or health care professional as soon as possible: -allergic reactions like skin rash, itching or hives, swelling of the face, lips, or tongue -anxiety, confusion, or depression -breathing problems -changes in vision -feeling faint or lightheaded, falls -jaw burning, cramping, pain -muscle cramps, stiffness, or weakness -trouble passing urine or change in the amount of urine Side effects that usually do not require medical attention (report to your doctor or health care professional if they continue or are bothersome): -bone, joint, or muscle pain -fever -hair loss -irritation at site where injected -loss of appetite -nausea, vomiting -stomach upset -tired This list may not describe all possible side effects. Call your doctor for medical advice about side effects. You may report side effects to FDA at 1-800-FDA-1088. Where should I keep my medicine? This drug is given in a hospital or clinic and will not be stored at home. NOTE: This sheet is a summary. It may not cover all possible information. If you have questions about this medicine, talk to your doctor, pharmacist, or health care provider.  2013, Elsevier/Gold Standard. (01/01/2011 9:06:58 AM)   PNEUMOCOCCAL VACCINE, POLYVALENT (NEU mo KOK al vak SEEN, pol ee VEY luhnt) is a vaccine to prevent pneumococcus bacteria infection. These bacteria are a major cause of ear infections, Strep throat infections, and serious pneumonia, meningitis, or blood infections worldwide. These vaccines help the body to produce antibodies (protective substances) that help your body defend against these bacteria. This vaccine is recommended for people 81 years of age and older with health problems. It is also recommended for all adults over 75 years old. This vaccine will not treat an infection. This medicine may be used for other purposes; ask your health care provider or pharmacist if you have questions. What should I tell my health care  provider before I take this medicine? They need to know if you have any of these conditions: -bleeding problems -bone marrow or organ transplant -cancer, Hodgkin's disease -fever -infection -immune system problems -low platelet count in the blood -seizures -an unusual or allergic reaction to pneumococcal vaccine, diphtheria toxoid, other vaccines, latex, other medicines, foods, dyes, or preservatives -pregnant or trying to get pregnant -breast-feeding How should I use this medicine? This vaccine is for injection into a muscle or under the skin. It is given by a health care professional. A copy of Vaccine Information Statements will be given before each vaccination. Read this sheet carefully each time. The sheet may change frequently. Talk to your pediatrician regarding the use of this medicine in children. While this drug may be prescribed for children as young as 21 years of age for selected conditions, precautions do apply. Overdosage: If you think you have taken too much of this medicine contact a poison control center or emergency room at once. NOTE: This medicine is only for you. Do not share this medicine with others. What if I miss a dose? It is important not to miss your dose. Call your doctor or health care professional if you are unable to keep an appointment. What may interact with this medicine? -medicines for cancer chemotherapy -medicines that suppress your immune function -medicines that treat or prevent blood clots  like warfarin, enoxaparin, and dalteparin -steroid medicines like prednisone or cortisone This list may not describe all possible interactions. Give your health care provider a list of all the medicines, herbs, non-prescription drugs, or dietary supplements you use. Also tell them if you smoke, drink alcohol, or use illegal drugs. Some items may interact with your medicine. What should I watch for while using this medicine? Mild fever and pain should go away in 3  days or less. Report any unusual symptoms to your doctor or health care professional. What side effects may I notice from receiving this medicine? Side effects that you should report to your doctor or health care professional as soon as possible: -allergic reactions like skin rash, itching or hives, swelling of the face, lips, or tongue -breathing problems -confused -fever over 102 degrees F -pain, tingling, numbness in the hands or feet -seizures -unusual bleeding or bruising -unusual muscle weakness Side effects that usually do not require medical attention (report to your doctor or health care professional if they continue or are bothersome): -aches and pains -diarrhea -fever of 102 degrees F or less -headache -irritable -loss of appetite -pain, tender at site where injected -trouble sleeping This list may not describe all possible side effects. Call your doctor for medical advice about side effects. You may report side effects to FDA at 1-800-FDA-1088. Where should I keep my medicine? This does not apply. This vaccine is given in a clinic, pharmacy, doctor's office, or other health care setting and will not be stored at home. NOTE: This sheet is a summary. It may not cover all possible information. If you have questions about this medicine, talk to your doctor, pharmacist, or health care provider.  2012, Elsevier/Gold Standard. (02/09/2008 2:32:37 PM)

## 2013-05-07 NOTE — Progress Notes (Signed)
[facility] OFFICE PROGRESS NOTE  Patient Care Team: Darnelle Bos, MD as PCP - General (Internal Medicine)  DIAGNOSIS: IgG kappa multiple myeloma  SUMMARY OF ONCOLOGIC HISTORY: IgG kappa multiple myeloma diagnosed in February 2011. Bone marrow aspirate and biopsy on 09/19/2009 showed 48% plasma cells. IgG level at that time was 4910. Serum kappa light chains were 3.90. Beta 2 microglobulin was 3.77. Metastatic bone survey from 09/05/2009 showed no definite lytic lesions. The patient received treatment with subcutaneous Velcade along with melphalan and prednisone from April 2011 through March 2012 with an excellent response to treatment. The patient then was on Revlimid with weekly Decadron. These treatments were apparently started in late January 2013. The patient currently is on Revlimid 10 mg daily for 2 weeks out of every 3 weeks. Due to elevated light chains, the dose of Revlimid was increased again recently. September 2014, she was noted to have progressive pancytopenia. 2 dose of Revlimid was reduced back and weekly dexamethasone was readmitted. On 05/07/2013 we started him on monthly Zometa  INTERVAL HISTORY: John Carter 79 y.o. male returns for further followup. Denies any bleeding such as epistaxis, hematuria or hematochezia. He does have easy bruising. Denies any new bone pain.He denies any recent fever, chills, night sweats or abnormal weight loss. He complained of intermittent loose bowel movements after meals. Yesterday had 6 bowel movement. The patient is currently off his Revlimid for week.  I have reviewed the past medical history, past surgical history, social history and family history with the patient and they are unchanged from previous note.  ALLERGIES:  is allergic to sulfa drugs cross reactors.  MEDICATIONS:  Current Outpatient Prescriptions  Medication Sig Dispense Refill  . amLODipine (NORVASC) 5 MG tablet Take 5 mg by mouth daily.         Marland Kitchen aspirin 325 MG EC tablet Take 325 mg by mouth daily.        . cetirizine-pseudoephedrine (ZYRTEC-D) 5-120 MG per tablet Take 1 tablet by mouth 2 (two) times daily.       . clopidogrel (PLAVIX) 75 MG tablet Take 75 mg by mouth daily.       Marland Kitchen dexamethasone (DECADRON) 4 MG tablet Take 2 tablets (8 mg total) by mouth every 7 (seven) days. Take 2 tabs every Tuesday am with breakfast  30 tablet  1  . flunisolide (NASAREL) 29 MCG/ACT (0.025%) nasal spray Place 2 sprays into the nose at bedtime. Dose is for each nostril.       Marland Kitchen gabapentin (NEURONTIN) 300 MG capsule Take 300 mg by mouth at bedtime.      Melene Muller ON 05/12/2013] lenalidomide (REVLIMID) 10 MG capsule Take 1 capsule (10 mg total) by mouth daily.  21 capsule  0  . metoprolol (TOPROL-XL) 50 MG 24 hr tablet Take 50 mg by mouth daily.        . Multiple Vitamins-Minerals (MULTIVITAMINS THER. W/MINERALS) TABS Take 1 tablet by mouth daily. Centrum silver      . nitroGLYCERIN (NITROSTAT) 0.4 MG SL tablet Place 0.4 mg under the tongue every 5 (five) minutes as needed. For chest pain       . omeprazole (PRILOSEC) 20 MG capsule Take 20 mg by mouth daily.        . simvastatin (ZOCOR) 20 MG tablet Take 20 mg by mouth at bedtime.        . valsartan (DIOVAN) 320 MG tablet Take 320 mg by mouth daily.       Marland Kitchen  Vitamin D, Ergocalciferol, (DRISDOL) 50000 UNITS CAPS Take 50,000 Units by mouth every 14 (fourteen) days.        Current Facility-Administered Medications  Medication Dose Route Frequency Provider Last Rate Last Dose  . pneumococcal 23 valent vaccine (PNU-IMMUNE) injection 0.5 mL  0.5 mL Intramuscular Once Artis Delay, MD        REVIEW OF SYSTEMS:   Constitutional: Denies fevers, chills or abnormal weight loss Eyes: Denies blurriness of vision Ears, nose, mouth, throat, and face: Denies mucositis or sore throat Respiratory: Denies cough, dyspnea or wheezes Cardiovascular: Denies palpitation, chest discomfort or lower extremity swelling Skin:  Denies abnormal skin rashes Lymphatics: Denies new lymphadenopathy or easy bruising Neurological:Denies numbness, tingling or new weaknesses Behavioral/Psych: Mood is stable, no new changes  All other systems were reviewed with the patient and are negative.  PHYSICAL EXAMINATION: ECOG PERFORMANCE STATUS: 1 - Symptomatic but completely ambulatory  Filed Vitals:   05/07/13 0901  BP: 162/74  Pulse: 78  Temp: 97.1 F (36.2 C)  Resp: 20   Filed Weights   05/07/13 0901  Weight: 199 lb 8 oz (90.493 kg)    GENERAL:alert, no distress and comfortable. Patient elderly in no distress SKIN: skin color, texture, turgor are normal, no rashes or significant lesions. Multiple bruises were noted. No petechiae rash EYES: normal, Conjunctiva are pink and non-injected, sclera clear OROPHARYNX:no exudate, no erythema and lips, buccal mucosa, and tongue normal  NECK: supple, thyroid normal size, non-tender, without nodularity LYMPH:  no palpable lymphadenopathy in the cervical, axillary or inguinal LUNGS: clear to auscultation and percussion with normal breathing effort HEART: regular rate & rhythm and no murmurs and no lower extremity edema ABDOMEN:abdomen soft, non-tender and normal bowel sounds Musculoskeletal:no cyanosis of digits and no clubbing  NEURO: alert & oriented x 3 with fluent speech, no focal motor/sensory deficits  LABORATORY DATA:  I have reviewed the data as listed    Component Value Date/Time   NA 139 05/07/2013 0844   NA 140 03/07/2012 1043   K 3.7 05/07/2013 0844   K 4.4 03/07/2012 1043   CL 106 12/05/2012 0918   CL 106 03/07/2012 1043   CO2 21* 05/07/2013 0844   CO2 26 03/07/2012 1043   GLUCOSE 190* 05/07/2013 0844   GLUCOSE 152* 12/05/2012 0918   GLUCOSE 120* 03/07/2012 1043   BUN 20.7 05/07/2013 0844   BUN 24* 03/07/2012 1043   CREATININE 1.0 05/07/2013 0844   CREATININE 1.08 03/07/2012 1043   CALCIUM 8.7 05/07/2013 0844   CALCIUM 9.0 03/07/2012 1043   PROT 6.9  05/07/2013 0844   PROT 6.7 03/07/2012 1043   ALBUMIN 3.1* 05/07/2013 0844   ALBUMIN 3.8 03/07/2012 1043   AST 12 05/07/2013 0844   AST 16 03/07/2012 1043   ALT 16 05/07/2013 0844   ALT 19 03/07/2012 1043   ALKPHOS 93 05/07/2013 0844   ALKPHOS 94 03/07/2012 1043   BILITOT 0.45 05/07/2013 0844   BILITOT 0.3 03/07/2012 1043   GFRNONAA 65* 07/01/2011 0615   GFRAA 75* 07/01/2011 0615    No results found for this basename: SPEP,  UPEP,   kappa and lambda light chains    Lab Results  Component Value Date   WBC 5.7 05/07/2013   NEUTROABS 3.1 05/07/2013   HGB 12.0* 05/07/2013   HCT 36.7* 05/07/2013   MCV 96.7 05/07/2013   PLT 64* 05/07/2013      Chemistry      Component Value Date/Time   NA 139 05/07/2013 0844  NA 140 03/07/2012 1043   K 3.7 05/07/2013 0844   K 4.4 03/07/2012 1043   CL 106 12/05/2012 0918   CL 106 03/07/2012 1043   CO2 21* 05/07/2013 0844   CO2 26 03/07/2012 1043   BUN 20.7 05/07/2013 0844   BUN 24* 03/07/2012 1043   CREATININE 1.0 05/07/2013 0844   CREATININE 1.08 03/07/2012 1043      Component Value Date/Time   CALCIUM 8.7 05/07/2013 0844   CALCIUM 9.0 03/07/2012 1043   ALKPHOS 93 05/07/2013 0844   ALKPHOS 94 03/07/2012 1043   AST 12 05/07/2013 0844   AST 16 03/07/2012 1043   ALT 16 05/07/2013 0844   ALT 19 03/07/2012 1043   BILITOT 0.45 05/07/2013 0844   BILITOT 0.3 03/07/2012 1043     ASSESSMENT:  Multiple myeloma  PLAN:  1) Multiple Myeloma He had better response on higher dose Revlimid at the expense of worsening pancytopenia. I have reduced Revlimid back to 10 mg 21 days on, 7 days off and add back weekly Dexamethasone. He has not been prescribed IV bisphosphonates. We have obtained dentist clearance. I discussed with him risks, benefits and side-effects of IV zometa and he is in agreement to proceed. I will add IV zometa to start today. The patient will take calcium and vitamin D supplement 2) Worsening pancytopenia He is asymptomatic but at severe risk  of bleeding with worsening platelet count. Patient is on dual anti-platelet agents for his cardiac condition. As mentioned above, I will reduce the dose of his chemotherapy. We will monitor for signs and symptoms of bleeding carefully 3) Preventive care I recommend pneumonia vaccination today and we will administer in the office #4 diarrhea This is not related to side effects of treatment. I recommend he reduced intake of daily Protonix and see if this could be due to lactose intolerance. I also recommend the patient to take his Imodium as needed   Orders Placed This Encounter  Procedures  . Beta 2 microglobuline, serum    Standing Status: Future     Number of Occurrences:      Standing Expiration Date: 05/07/2014  . IgG, IgA, IgM    Standing Status: Future     Number of Occurrences:      Standing Expiration Date: 05/07/2014  . Immunofixation electrophoresis    Standing Status: Future     Number of Occurrences:      Standing Expiration Date: 05/07/2014  . Protein electrophoresis, serum    Standing Status: Future     Number of Occurrences:      Standing Expiration Date: 05/07/2014  . Kappa/lambda light chains    Standing Status: Future     Number of Occurrences:      Standing Expiration Date: 05/07/2014  . CBC with Differential    Standing Status: Future     Number of Occurrences:      Standing Expiration Date: 01/27/2014  . Comprehensive metabolic panel    Standing Status: Future     Number of Occurrences:      Standing Expiration Date: 05/07/2014   All questions were answered. The patient knows to call the clinic with any problems, questions or concerns. No barriers to learning was detected.    Bengie Kaucher, MD 05/07/2013 9:44 AM

## 2013-05-07 NOTE — Progress Notes (Signed)
Faxed revlimid prescription to [facility] °

## 2013-05-07 NOTE — Progress Notes (Signed)
Pneumonia vaccination given in infusion room.

## 2013-05-07 NOTE — Telephone Encounter (Signed)
Per staff message and POF I have scheduled appts.  JMW  

## 2013-05-07 NOTE — Telephone Encounter (Signed)
gv and printed appt sched and avs for pt for NOV.Marland Kitchenemaield MW to add tx...added on inj for today

## 2013-05-09 LAB — SPEP & IFE WITH QIG
Albumin ELP: 54.9 % — ABNORMAL LOW (ref 55.8–66.1)
Alpha-1-Globulin: 4.3 % (ref 2.9–4.9)
Alpha-2-Globulin: 10 % (ref 7.1–11.8)
Beta 2: 5 % (ref 3.2–6.5)
Beta Globulin: 6.3 % (ref 4.7–7.2)
IgA: 236 mg/dL (ref 68–379)
IgG (Immunoglobin G), Serum: 1320 mg/dL (ref 650–1600)
M-Spike, %: 0.79 g/dL
Total Protein, Serum Electrophoresis: 6.5 g/dL (ref 6.0–8.3)

## 2013-05-09 LAB — KAPPA/LAMBDA LIGHT CHAINS
Kappa free light chain: 5.32 mg/dL — ABNORMAL HIGH (ref 0.33–1.94)
Lambda Free Lght Chn: 3.35 mg/dL — ABNORMAL HIGH (ref 0.57–2.63)

## 2013-05-09 LAB — BETA 2 MICROGLOBULIN, SERUM: Beta-2 Microglobulin: 2.44 mg/L — ABNORMAL HIGH (ref 1.01–1.73)

## 2013-05-28 ENCOUNTER — Encounter: Payer: Self-pay | Admitting: *Deleted

## 2013-05-28 NOTE — Progress Notes (Signed)
Revlimid Authorization number from [facility] #[id number], given to Axel Filler in managed care dept.Marland Kitchen

## 2013-05-29 ENCOUNTER — Other Ambulatory Visit: Payer: Self-pay | Admitting: *Deleted

## 2013-05-29 NOTE — Progress Notes (Signed)
PT. WILL BE RECEIVING HIS REVLIMID FROM [facility].

## 2013-05-29 NOTE — Telephone Encounter (Signed)
THIS REFILL REQUEST FOR REVLIMID WAS PLACED IN DR.GORSUCH'S ACTIVE WORK FOLDER. 

## 2013-06-04 ENCOUNTER — Other Ambulatory Visit (HOSPITAL_BASED_OUTPATIENT_CLINIC_OR_DEPARTMENT_OTHER): Payer: Medicare Other | Admitting: Lab

## 2013-06-04 ENCOUNTER — Telehealth: Payer: Self-pay | Admitting: Hematology and Oncology

## 2013-06-04 ENCOUNTER — Ambulatory Visit (HOSPITAL_BASED_OUTPATIENT_CLINIC_OR_DEPARTMENT_OTHER): Payer: Medicare Other | Admitting: Hematology and Oncology

## 2013-06-04 ENCOUNTER — Ambulatory Visit (HOSPITAL_BASED_OUTPATIENT_CLINIC_OR_DEPARTMENT_OTHER): Payer: Medicare Other

## 2013-06-04 VITALS — BP 157/73 | HR 81 | Temp 97.5°F | Resp 18 | Ht 68.0 in | Wt 201.8 lb

## 2013-06-04 DIAGNOSIS — D649 Anemia, unspecified: Secondary | ICD-10-CM

## 2013-06-04 DIAGNOSIS — C9000 Multiple myeloma not having achieved remission: Secondary | ICD-10-CM

## 2013-06-04 DIAGNOSIS — D539 Nutritional anemia, unspecified: Secondary | ICD-10-CM

## 2013-06-04 DIAGNOSIS — R233 Spontaneous ecchymoses: Secondary | ICD-10-CM

## 2013-06-04 DIAGNOSIS — D696 Thrombocytopenia, unspecified: Secondary | ICD-10-CM

## 2013-06-04 DIAGNOSIS — D61818 Other pancytopenia: Secondary | ICD-10-CM

## 2013-06-04 DIAGNOSIS — Z23 Encounter for immunization: Secondary | ICD-10-CM

## 2013-06-04 DIAGNOSIS — R609 Edema, unspecified: Secondary | ICD-10-CM

## 2013-06-04 LAB — COMPREHENSIVE METABOLIC PANEL (CC13)
ALT: 17 U/L (ref 0–55)
Albumin: 3 g/dL — ABNORMAL LOW (ref 3.5–5.0)
Anion Gap: 11 mEq/L (ref 3–11)
CO2: 20 mEq/L — ABNORMAL LOW (ref 22–29)
Calcium: 8.1 mg/dL — ABNORMAL LOW (ref 8.4–10.4)
Chloride: 107 mEq/L (ref 98–109)
Glucose: 226 mg/dl — ABNORMAL HIGH (ref 70–140)
Sodium: 138 mEq/L (ref 136–145)
Total Bilirubin: 0.34 mg/dL (ref 0.20–1.20)
Total Protein: 6.7 g/dL (ref 6.4–8.3)

## 2013-06-04 LAB — CBC WITH DIFFERENTIAL/PLATELET
BASO%: 0.6 % (ref 0.0–2.0)
Basophils Absolute: 0 10*3/uL (ref 0.0–0.1)
Eosinophils Absolute: 0.2 10*3/uL (ref 0.0–0.5)
HCT: 35.1 % — ABNORMAL LOW (ref 38.4–49.9)
HGB: 11.6 g/dL — ABNORMAL LOW (ref 13.0–17.1)
LYMPH%: 15.6 % (ref 14.0–49.0)
MCHC: 32.9 g/dL (ref 32.0–36.0)
MONO#: 1.7 10*3/uL — ABNORMAL HIGH (ref 0.1–0.9)
NEUT#: 4.5 10*3/uL (ref 1.5–6.5)
Platelets: 74 10*3/uL — ABNORMAL LOW (ref 140–400)
RBC: 3.64 10*6/uL — ABNORMAL LOW (ref 4.20–5.82)
WBC: 7.6 10*3/uL (ref 4.0–10.3)
lymph#: 1.2 10*3/uL (ref 0.9–3.3)

## 2013-06-04 MED ORDER — ZOLEDRONIC ACID 4 MG/5ML IV CONC
3.5000 mg | Freq: Once | INTRAVENOUS | Status: AC
  Administered 2013-06-04: 3.5 mg via INTRAVENOUS
  Filled 2013-06-04: qty 4.38

## 2013-06-04 MED ORDER — SODIUM CHLORIDE 0.9 % IV SOLN
Freq: Once | INTRAVENOUS | Status: AC
  Administered 2013-06-04: 11:00:00 via INTRAVENOUS

## 2013-06-04 NOTE — Telephone Encounter (Signed)
gv and printed appt sched and avs for pt for DEC....sed added tx. °

## 2013-06-04 NOTE — Progress Notes (Signed)
[facility] OFFICE PROGRESS NOTE  Patient Care Team: Darnelle Bos, MD as PCP - General (Internal Medicine) Artis Delay, MD as Consulting Physician (Hematology and Oncology)  DIAGNOSIS: IgG kappa multiple myeloma SUMMARY OF ONCOLOGIC HISTORY: IgG kappa multiple myeloma diagnosed in February 2011. Bone marrow aspirate and biopsy on 09/19/2009 showed 48% plasma cells. IgG level at that time was 4910. Serum kappa light chains were 3.90. Beta 2 microglobulin was 3.77. Metastatic bone survey from 09/05/2009 showed no definite lytic lesions. The patient received treatment with subcutaneous Velcade along with melphalan and prednisone from April 2011 through March 2012 with an excellent response to treatment. The patient then was on Revlimid with weekly Decadron. These treatments were apparently started in late January 2013. The patient currently is on Revlimid 10 mg daily for 2 weeks out of every 3 weeks. Due to elevated light chains, the dose of Revlimid was increased again recently. September 2014, she was noted to have progressive pancytopenia. 2 dose of Revlimid was reduced back and weekly dexamethasone was readmitted. On 05/07/2013 we started him on monthly Zometa  INTERVAL HISTORY: John Carter 79 y.o. male returns for further followup. His main complaint is leg swelling at the end of the day. He denies any recent fever, chills, night sweats or abnormal weight loss The patient denies any recent signs or symptoms of bleeding such as spontaneous epistaxis, hematuria or hematochezia. He does complain of easy bruising.  I have reviewed the past medical history, past surgical history, social history and family history with the patient and they are unchanged from previous note.  ALLERGIES:  is allergic to sulfa drugs cross reactors.  MEDICATIONS:  Current Outpatient Prescriptions  Medication Sig Dispense Refill  . amLODipine (NORVASC) 5 MG tablet Take 5 mg by mouth  daily.        Marland Kitchen aspirin 325 MG EC tablet Take 325 mg by mouth daily.        . cetirizine-pseudoephedrine (ZYRTEC-D) 5-120 MG per tablet Take 1 tablet by mouth 2 (two) times daily.       . clopidogrel (PLAVIX) 75 MG tablet Take 75 mg by mouth daily.       Marland Kitchen dexamethasone (DECADRON) 4 MG tablet Take 4 mg by mouth every 7 (seven) days. Take 2 tabs every Tuesday am with breakfast      . flunisolide (NASAREL) 29 MCG/ACT (0.025%) nasal spray Place 2 sprays into the nose at bedtime. Dose is for each nostril.       Marland Kitchen gabapentin (NEURONTIN) 300 MG capsule Take 300 mg by mouth at bedtime.      Marland Kitchen lenalidomide (REVLIMID) 10 MG capsule Take 1 capsule (10 mg total) by mouth daily.  21 capsule  0  . metoprolol (TOPROL-XL) 50 MG 24 hr tablet Take 50 mg by mouth daily.        . Multiple Vitamins-Minerals (MULTIVITAMINS THER. W/MINERALS) TABS Take 1 tablet by mouth daily. Centrum silver      . nitroGLYCERIN (NITROSTAT) 0.4 MG SL tablet Place 0.4 mg under the tongue every 5 (five) minutes as needed. For chest pain       . omeprazole (PRILOSEC) 20 MG capsule Take 20 mg by mouth daily.        . simvastatin (ZOCOR) 20 MG tablet Take 20 mg by mouth at bedtime.        . valsartan (DIOVAN) 320 MG tablet Take 320 mg by mouth daily.       . Vitamin D, Ergocalciferol, (DRISDOL)  50000 UNITS CAPS Take 50,000 Units by mouth every 14 (fourteen) days.        No current facility-administered medications for this visit.    REVIEW OF SYSTEMS:   Constitutional: Denies fevers, chills or abnormal weight loss Eyes: Denies blurriness of vision Ears, nose, mouth, throat, and face: Denies mucositis or sore throat Respiratory: Denies cough, dyspnea or wheezes Cardiovascular: Denies palpitation, chest discomfort  Gastrointestinal:  Denies nausea, heartburn or change in bowel habits Skin: Denies abnormal skin rashes Lymphatics: Denies new lymphadenopathy or easy bruising Neurological:Denies numbness, tingling or new  weaknesses Behavioral/Psych: Mood is stable, no new changes  All other systems were reviewed with the patient and are negative.  PHYSICAL EXAMINATION: ECOG PERFORMANCE STATUS: 1 - Symptomatic but completely ambulatory  Filed Vitals:   06/04/13 1006  BP: 157/73  Pulse: 81  Temp: 97.5 F (36.4 C)  Resp: 18   Filed Weights   06/04/13 1006  Weight: 201 lb 12.8 oz (91.536 kg)    GENERAL:alert, no distress and comfortable SKIN: skin color, texture, turgor are normal, no rashes or significant lesions. Noted multiple bruises but no active bleeding. EYES: normal, Conjunctiva are pink and non-injected, sclera clear OROPHARYNX:no exudate, no erythema and lips, buccal mucosa, and tongue normal  NECK: supple, thyroid normal size, non-tender, without nodularity LYMPH:  no palpable lymphadenopathy in the cervical, axillary or inguinal LUNGS: clear to auscultation and percussion with normal breathing effort HEART: regular rate & rhythm and no murmurs. Mild bilateral lower extremity edema was noted. ABDOMEN:abdomen soft, non-tender and normal bowel sounds Musculoskeletal:no cyanosis of digits and no clubbing  NEURO: alert & oriented x 3 with fluent speech, no focal motor/sensory deficits  LABORATORY DATA:  I have reviewed the data as listed    Component Value Date/Time   NA 138 06/04/2013 0857   NA 140 03/07/2012 1043   K 4.0 06/04/2013 0857   K 4.4 03/07/2012 1043   CL 106 12/05/2012 0918   CL 106 03/07/2012 1043   CO2 20* 06/04/2013 0857   CO2 26 03/07/2012 1043   GLUCOSE 226* 06/04/2013 0857   GLUCOSE 152* 12/05/2012 0918   GLUCOSE 120* 03/07/2012 1043   BUN 18.9 06/04/2013 0857   BUN 24* 03/07/2012 1043   CREATININE 1.1 06/04/2013 0857   CREATININE 1.08 03/07/2012 1043   CALCIUM 8.1* 06/04/2013 0857   CALCIUM 9.0 03/07/2012 1043   PROT 6.7 06/04/2013 0857   PROT 6.7 03/07/2012 1043   ALBUMIN 3.0* 06/04/2013 0857   ALBUMIN 3.8 03/07/2012 1043   AST 11 06/04/2013 0857   AST 16  03/07/2012 1043   ALT 17 06/04/2013 0857   ALT 19 03/07/2012 1043   ALKPHOS 100 06/04/2013 0857   ALKPHOS 94 03/07/2012 1043   BILITOT 0.34 06/04/2013 0857   BILITOT 0.3 03/07/2012 1043   GFRNONAA 65* 07/01/2011 0615   GFRAA 75* 07/01/2011 0615    No results found for this basename: SPEP,  UPEP,   kappa and lambda light chains    Lab Results  Component Value Date   WBC 7.6 06/04/2013   NEUTROABS 4.5 06/04/2013   HGB 11.6* 06/04/2013   HCT 35.1* 06/04/2013   MCV 96.4 06/04/2013   PLT 74* 06/04/2013      Chemistry      Component Value Date/Time   NA 138 06/04/2013 0857   NA 140 03/07/2012 1043   K 4.0 06/04/2013 0857   K 4.4 03/07/2012 1043   CL 106 12/05/2012 0918   CL 106 03/07/2012 1043  CO2 20* 06/04/2013 0857   CO2 26 03/07/2012 1043   BUN 18.9 06/04/2013 0857   BUN 24* 03/07/2012 1043   CREATININE 1.1 06/04/2013 0857   CREATININE 1.08 03/07/2012 1043      Component Value Date/Time   CALCIUM 8.1* 06/04/2013 0857   CALCIUM 9.0 03/07/2012 1043   ALKPHOS 100 06/04/2013 0857   ALKPHOS 94 03/07/2012 1043   AST 11 06/04/2013 0857   AST 16 03/07/2012 1043   ALT 17 06/04/2013 0857   ALT 19 03/07/2012 1043   BILITOT 0.34 06/04/2013 0857   BILITOT 0.3 03/07/2012 1043     ASSESSMENT & PLAN:  #1 IgG kappa multiple myeloma We will continue Revlimid at 10 mg, 21 days on, 7 days off. I reducing the dexamethasone to 4 mg once a week due to leg edema. We will continue on Zometa infusion once a month. His light chains and M protein is improving. #2Thrombocytopenia This is likely due to recent treatment. The patient denies recent history of bleeding such as epistaxis, hematuria or hematochezia. He is asymptomatic from the low platelet count. I will observe for now.  he does not require transfusion now. I will continue the chemotherapy at current dose without dosage adjustment.  If the thrombocytopenia gets progressive worse in the future, I might have to delay his treatment or adjust the  chemotherapy dose. #3 easy bruising This is due to low platelet count and the fact that he is on antiplatelet agents. We will monitor for signs and symptoms of bleeding. #4 anemia This is likely due to recent treatment. The patient denies recent history of bleeding such as epistaxis, hematuria or hematochezia. He is asymptomatic from the anemia. I will observe for now.  He does not require transfusion now. I will continue the chemotherapy at current dose without dosage adjustment.  If the anemia gets progressive worse in the future, I might have to delay his treatment or adjust the chemotherapy dose. #5 preventive care The patient will continue on calcium and vitamin D.  #6 leg edema We will monitored carefully. I am reducing the dexamethasone dose as mentioned above to 1 tablet once a week.  Orders Placed This Encounter  Procedures  . CBC with Differential    Standing Status: Future     Number of Occurrences:      Standing Expiration Date: 02/24/2014  . Comprehensive metabolic panel    Standing Status: Future     Number of Occurrences:      Standing Expiration Date: 06/04/2014  . Beta 2 microglobuline, serum    Standing Status: Future     Number of Occurrences:      Standing Expiration Date: 06/04/2014  . SPEP & IFE with QIG    Standing Status: Future     Number of Occurrences:      Standing Expiration Date: 06/04/2014  . Kappa/lambda light chains    Standing Status: Future     Number of Occurrences:      Standing Expiration Date: 06/04/2014   All questions were answered. The patient knows to call the clinic with any problems, questions or concerns. No barriers to learning was detected. I spent 25 minutes counseling the patient face to face. The total time spent in the appointment was 40 minutes and more than 50% was on counseling and review of test results     [facility], Suzane Vanderweide, MD 06/04/2013 10:15 AM

## 2013-06-04 NOTE — Patient Instructions (Signed)
Zoledronic Acid injection (Hypercalcemia, Oncology)--(Zometa) What is this medicine? ZOLEDRONIC ACID (ZOE le dron ik AS id) lowers the amount of calcium loss from bone. It is used to treat too much calcium in your blood from cancer. It is also used to prevent complications of cancer that has spread to the bone. This medicine may be used for other purposes; ask your health care provider or pharmacist if you have questions. COMMON BRAND NAME(S): Zometa What should I tell my health care provider before I take this medicine? They need to know if you have any of these conditions: -aspirin-sensitive asthma -cancer, especially if you are receiving medicines used to treat cancer -dental disease or wear dentures -infection -kidney disease -receiving corticosteroids like dexamethasone or prednisone -an unusual or allergic reaction to zoledronic acid, other medicines, foods, dyes, or preservatives -pregnant or trying to get pregnant -breast-feeding How should I use this medicine? This medicine is for infusion into a vein. It is given by a health care professional in a hospital or clinic setting. Talk to your pediatrician regarding the use of this medicine in children. Special care may be needed. Overdosage: If you think you have taken too much of this medicine contact a poison control center or emergency room at once. NOTE: This medicine is only for you. Do not share this medicine with others. What if I miss a dose? It is important not to miss your dose. Call your doctor or health care professional if you are unable to keep an appointment. What may interact with this medicine? -certain antibiotics given by injection -NSAIDs, medicines for pain and inflammation, like ibuprofen or naproxen -some diuretics like bumetanide, furosemide -teriparatide -thalidomide This list may not describe all possible interactions. Give your health care provider a list of all the medicines, herbs, non-prescription  drugs, or dietary supplements you use. Also tell them if you smoke, drink alcohol, or use illegal drugs. Some items may interact with your medicine. What should I watch for while using this medicine? Visit your doctor or health care professional for regular checkups. It may be some time before you see the benefit from this medicine. Do not stop taking your medicine unless your doctor tells you to. Your doctor may order blood tests or other tests to see how you are doing. Women should inform their doctor if they wish to become pregnant or think they might be pregnant. There is a potential for serious side effects to an unborn child. Talk to your health care professional or pharmacist for more information. You should make sure that you get enough calcium and vitamin D while you are taking this medicine. Discuss the foods you eat and the vitamins you take with your health care professional. Some people who take this medicine have severe bone, joint, and/or muscle pain. This medicine may also increase your risk for jaw problems or a broken thigh bone. Tell your doctor right away if you have severe pain in your jaw, bones, joints, or muscles. Tell your doctor if you have any pain that does not go away or that gets worse. Tell your dentist and dental surgeon that you are taking this medicine. You should not have major dental surgery while on this medicine. See your dentist to have a dental exam and fix any dental problems before starting this medicine. Take good care of your teeth while on this medicine. Make sure you see your dentist for regular follow-up appointments. What side effects may I notice from receiving this medicine? Side effects that   you should report to your doctor or health care professional as soon as possible: -allergic reactions like skin rash, itching or hives, swelling of the face, lips, or tongue -anxiety, confusion, or depression -breathing problems -changes in vision -eye  pain -feeling faint or lightheaded, falls -jaw pain, especially after dental work -mouth sores -muscle cramps, stiffness, or weakness -trouble passing urine or change in the amount of urine Side effects that usually do not require medical attention (report to your doctor or health care professional if they continue or are bothersome): -bone, joint, or muscle pain -constipation -diarrhea -fever -hair loss -irritation at site where injected -loss of appetite -nausea, vomiting -stomach upset -trouble sleeping -trouble swallowing -weak or tired This list may not describe all possible side effects. Call your doctor for medical advice about side effects. You may report side effects to FDA at 1-800-FDA-1088. Where should I keep my medicine? This drug is given in a hospital or clinic and will not be stored at home. NOTE: This sheet is a summary. It may not cover all possible information. If you have questions about this medicine, talk to your doctor, pharmacist, or health care provider.  2014, [employer]vier/Gold Standard. (2012-12-14 13:03:13)  

## 2013-06-06 LAB — KAPPA/LAMBDA LIGHT CHAINS
Kappa free light chain: 7.03 mg/dL — ABNORMAL HIGH (ref 0.33–1.94)
Kappa:Lambda Ratio: 2 — ABNORMAL HIGH (ref 0.26–1.65)

## 2013-06-06 LAB — SPEP & IFE WITH QIG
Albumin ELP: 51 % — ABNORMAL LOW (ref 55.8–66.1)
Alpha-1-Globulin: 7 % — ABNORMAL HIGH (ref 2.9–4.9)
Alpha-2-Globulin: 12.8 % — ABNORMAL HIGH (ref 7.1–11.8)
Beta 2: 4.5 % (ref 3.2–6.5)
Beta Globulin: 7.1 % (ref 4.7–7.2)
Gamma Globulin: 17.6 % (ref 11.1–18.8)
IgG (Immunoglobin G), Serum: 1300 mg/dL (ref 650–1600)
IgM, Serum: 41 mg/dL (ref 41–251)
M-Spike, %: 0.7 g/dL

## 2013-06-06 LAB — IGG, IGA, IGM: IgG (Immunoglobin G), Serum: 1300 mg/dL (ref 650–1600)

## 2013-06-06 LAB — BETA 2 MICROGLOBULIN, SERUM: Beta-2 Microglobulin: 2.89 mg/L — ABNORMAL HIGH (ref 1.01–1.73)

## 2013-06-21 ENCOUNTER — Other Ambulatory Visit: Payer: Self-pay | Admitting: Dermatology

## 2013-06-28 ENCOUNTER — Other Ambulatory Visit: Payer: Self-pay | Admitting: *Deleted

## 2013-06-28 NOTE — Telephone Encounter (Signed)
THIS REFILL REQUEST FOR REVLIMID WAS PLACED IN DR.GORSUCH'S ACTIVE WORK FOLDER. 

## 2013-07-02 ENCOUNTER — Other Ambulatory Visit (HOSPITAL_BASED_OUTPATIENT_CLINIC_OR_DEPARTMENT_OTHER): Payer: Medicare Other

## 2013-07-02 ENCOUNTER — Other Ambulatory Visit: Payer: Self-pay | Admitting: *Deleted

## 2013-07-02 ENCOUNTER — Encounter: Payer: Self-pay | Admitting: Hematology and Oncology

## 2013-07-02 ENCOUNTER — Ambulatory Visit (HOSPITAL_BASED_OUTPATIENT_CLINIC_OR_DEPARTMENT_OTHER): Payer: Medicare Other

## 2013-07-02 ENCOUNTER — Ambulatory Visit (HOSPITAL_BASED_OUTPATIENT_CLINIC_OR_DEPARTMENT_OTHER): Payer: Medicare Other | Admitting: Hematology and Oncology

## 2013-07-02 ENCOUNTER — Telehealth: Payer: Self-pay | Admitting: Hematology and Oncology

## 2013-07-02 VITALS — BP 172/55 | HR 85 | Temp 97.7°F | Resp 17 | Ht 68.0 in | Wt 201.3 lb

## 2013-07-02 DIAGNOSIS — Z23 Encounter for immunization: Secondary | ICD-10-CM

## 2013-07-02 DIAGNOSIS — D696 Thrombocytopenia, unspecified: Secondary | ICD-10-CM

## 2013-07-02 DIAGNOSIS — C9000 Multiple myeloma not having achieved remission: Secondary | ICD-10-CM

## 2013-07-02 DIAGNOSIS — D649 Anemia, unspecified: Secondary | ICD-10-CM

## 2013-07-02 DIAGNOSIS — D61818 Other pancytopenia: Secondary | ICD-10-CM

## 2013-07-02 DIAGNOSIS — D539 Nutritional anemia, unspecified: Secondary | ICD-10-CM

## 2013-07-02 DIAGNOSIS — R609 Edema, unspecified: Secondary | ICD-10-CM

## 2013-07-02 LAB — COMPREHENSIVE METABOLIC PANEL (CC13)
ALT: 18 U/L (ref 0–55)
AST: 14 U/L (ref 5–34)
Albumin: 3.2 g/dL — ABNORMAL LOW (ref 3.5–5.0)
Alkaline Phosphatase: 84 U/L (ref 40–150)
BUN: 25.5 mg/dL (ref 7.0–26.0)
Calcium: 8.7 mg/dL (ref 8.4–10.4)
Chloride: 107 mEq/L (ref 98–109)
Creatinine: 1.1 mg/dL (ref 0.7–1.3)
Glucose: 225 mg/dl — ABNORMAL HIGH (ref 70–140)
Potassium: 4 mEq/L (ref 3.5–5.1)

## 2013-07-02 LAB — CBC WITH DIFFERENTIAL/PLATELET
BASO%: 1 % (ref 0.0–2.0)
Basophils Absolute: 0.1 10*3/uL (ref 0.0–0.1)
EOS%: 4.9 % (ref 0.0–7.0)
Eosinophils Absolute: 0.3 10*3/uL (ref 0.0–0.5)
HGB: 12.3 g/dL — ABNORMAL LOW (ref 13.0–17.1)
MCH: 32.4 pg (ref 27.2–33.4)
MCHC: 33.1 g/dL (ref 32.0–36.0)
MCV: 97.7 fL (ref 79.3–98.0)
MONO%: 22.9 % — ABNORMAL HIGH (ref 0.0–14.0)
RBC: 3.8 10*6/uL — ABNORMAL LOW (ref 4.20–5.82)
RDW: 15.1 % — ABNORMAL HIGH (ref 11.0–14.6)
lymph#: 1.4 10*3/uL (ref 0.9–3.3)

## 2013-07-02 MED ORDER — LENALIDOMIDE 5 MG PO CAPS: 5.0000 mg | ORAL_CAPSULE | Freq: Every day | ORAL | Status: DC

## 2013-07-02 MED ORDER — SODIUM CHLORIDE 0.9 % IV SOLN
Freq: Once | INTRAVENOUS | Status: AC
  Administered 2013-07-02: 10:00:00 via INTRAVENOUS

## 2013-07-02 MED ORDER — ZOLEDRONIC ACID 4 MG/5ML IV CONC
3.5000 mg | Freq: Once | INTRAVENOUS | Status: AC
  Administered 2013-07-02: 3.5 mg via INTRAVENOUS
  Filled 2013-07-02: qty 4.38

## 2013-07-02 NOTE — Telephone Encounter (Signed)
gv and pritned appt sched and avs for pt for Jan 2015....sed  added tx.

## 2013-07-02 NOTE — Patient Instructions (Signed)

## 2013-07-02 NOTE — Progress Notes (Signed)
[facility] OFFICE PROGRESS NOTE  Patient Care Team: Darnelle Bos, MD as PCP - General (Internal Medicine) Artis Delay, MD as Consulting Physician (Hematology and Oncology)  DIAGNOSIS: Multiple myeloma IgG kappa  SUMMARY OF ONCOLOGIC HISTORY: IgG kappa multiple myeloma diagnosed in February 2011. Bone marrow aspirate and biopsy on 09/19/2009 showed 48% plasma cells. IgG level at that time was 4910. Serum kappa light chains were 3.90. Beta 2 microglobulin was 3.77. Metastatic bone survey from 09/05/2009 showed no definite lytic lesions. The patient received treatment with subcutaneous Velcade along with melphalan and prednisone from April 2011 through March 2012 with an excellent response to treatment. The patient then was on Revlimid with weekly Decadron. These treatments were apparently started in late January 2013. The patient currently is on Revlimid 10 mg daily for 2 weeks out of every 3 weeks. Due to elevated light chains, the dose of Revlimid was increased again recently. September 2014, she was noted to have progressive pancytopenia. 2 dose of Revlimid was reduced back and weekly dexamethasone was readmitted. On 05/07/2013 we started him on monthly Zometa On 07/02/2013, we held Revlimid due to thrombocytopenia. We also reduce the dose of Revlimid to 5 mg, 21 days on 7 days off. We plan to restart Revlimid on 07/26/2013 after his skin biopsy. INTERVAL HISTORY: John Carter 79 y.o. male returns for further followup. The patient has an accidental injury last week and had extensive bruises. He saw his dermatologist recently who felt that there is in suspicious skin lesion on his back requiring further surgery to exclude melanoma. The patient denies any recent signs or symptoms of bleeding such as spontaneous epistaxis, hematuria or hematochezia. He denies any recent fever, chills, night sweats or abnormal weight loss No new bone pain.  I have reviewed the past  medical history, past surgical history, social history and family history with the patient and they are unchanged from previous note.  ALLERGIES:  is allergic to sulfa drugs cross reactors.  MEDICATIONS:  Current Outpatient Prescriptions  Medication Sig Dispense Refill  . amLODipine (NORVASC) 5 MG tablet Take 5 mg by mouth daily.        Marland Kitchen aspirin 325 MG EC tablet Take 325 mg by mouth daily.        . cetirizine-pseudoephedrine (ZYRTEC-D) 5-120 MG per tablet Take 1 tablet by mouth 2 (two) times daily.       . clopidogrel (PLAVIX) 75 MG tablet Take 75 mg by mouth daily.       Marland Kitchen dexamethasone (DECADRON) 4 MG tablet Take 4 mg by mouth every 7 (seven) days. Take 2 tabs every Tuesday am with breakfast      . flunisolide (NASAREL) 29 MCG/ACT (0.025%) nasal spray Place 2 sprays into the nose at bedtime. Dose is for each nostril.       Marland Kitchen gabapentin (NEURONTIN) 300 MG capsule Take 300 mg by mouth at bedtime.      . metoprolol (TOPROL-XL) 50 MG 24 hr tablet Take 50 mg by mouth daily.        . Multiple Vitamins-Minerals (MULTIVITAMINS THER. W/MINERALS) TABS Take 1 tablet by mouth daily. Centrum silver      . nitroGLYCERIN (NITROSTAT) 0.4 MG SL tablet Place 0.4 mg under the tongue every 5 (five) minutes as needed. For chest pain       . omeprazole (PRILOSEC) 20 MG capsule Take 20 mg by mouth daily.        . simvastatin (ZOCOR) 20 MG tablet Take 20  mg by mouth at bedtime.        . valsartan (DIOVAN) 320 MG tablet Take 320 mg by mouth daily.       . Vitamin D, Ergocalciferol, (DRISDOL) 50000 UNITS CAPS Take 50,000 Units by mouth every 14 (fourteen) days.       Marland Kitchen lenalidomide (REVLIMID) 5 MG capsule Take 1 capsule (5 mg total) by mouth daily.  21 capsule  0   No current facility-administered medications for this visit.    REVIEW OF SYSTEMS:   Eyes: Denies blurriness of vision Ears, nose, mouth, throat, and face: Denies mucositis or sore throat Respiratory: Denies cough, dyspnea or  wheezes Cardiovascular: Denies palpitation, chest discomfort or lower extremity swelling Gastrointestinal:  Denies nausea, heartburn or change in bowel habits Lymphatics: Denies new lymphadenopathy or easy bruising Neurological:Denies numbness, tingling or new weaknesses Behavioral/Psych: Mood is stable, no new changes  All other systems were reviewed with the patient and are negative.  PHYSICAL EXAMINATION: ECOG PERFORMANCE STATUS: 1 - Symptomatic but completely ambulatory  Filed Vitals:   07/02/13 0845  BP: 172/55  Pulse: 85  Temp: 97.7 F (36.5 C)  Resp: 17   Filed Weights   07/02/13 0845  Weight: 201 lb 4.8 oz (91.309 kg)    GENERAL:alert, no distress and comfortable SKIN: skin color, texture, turgor are normal, no rashes EYES: normal, Conjunctiva are pink and non-injected, sclera clear OROPHARYNX:no exudate, no erythema and lips, buccal mucosa, and tongue normal  NECK: supple, thyroid normal size, non-tender, without nodularity LYMPH:  no palpable lymphadenopathy in the cervical, axillary or inguinal LUNGS: clear to auscultation and percussion with normal breathing effort HEART: regular rate & rhythm and no murmurs. Nodal bilateral lower extremity edema ABDOMEN:abdomen soft, non-tender and normal bowel sounds Musculoskeletal:no cyanosis of digits and no clubbing  NEURO: alert & oriented x 3 with fluent speech, no focal motor/sensory deficits  LABORATORY DATA:  I have reviewed the data as listed    Component Value Date/Time   NA 138 06/04/2013 0857   NA 140 03/07/2012 1043   K 4.0 06/04/2013 0857   K 4.4 03/07/2012 1043   CL 106 12/05/2012 0918   CL 106 03/07/2012 1043   CO2 20* 06/04/2013 0857   CO2 26 03/07/2012 1043   GLUCOSE 226* 06/04/2013 0857   GLUCOSE 152* 12/05/2012 0918   GLUCOSE 120* 03/07/2012 1043   BUN 18.9 06/04/2013 0857   BUN 24* 03/07/2012 1043   CREATININE 1.1 06/04/2013 0857   CREATININE 1.08 03/07/2012 1043   CALCIUM 8.1* 06/04/2013 0857    CALCIUM 9.0 03/07/2012 1043   PROT 6.7 06/04/2013 0857   PROT 6.7 03/07/2012 1043   ALBUMIN 3.0* 06/04/2013 0857   ALBUMIN 3.8 03/07/2012 1043   AST 11 06/04/2013 0857   AST 16 03/07/2012 1043   ALT 17 06/04/2013 0857   ALT 19 03/07/2012 1043   ALKPHOS 100 06/04/2013 0857   ALKPHOS 94 03/07/2012 1043   BILITOT 0.34 06/04/2013 0857   BILITOT 0.3 03/07/2012 1043   GFRNONAA 65* 07/01/2011 0615   GFRAA 75* 07/01/2011 0615    No results found for this basename: SPEP,  UPEP,   kappa and lambda light chains    Lab Results  Component Value Date   WBC 5.9 07/02/2013   NEUTROABS 2.8 07/02/2013   HGB 12.3* 07/02/2013   HCT 37.1* 07/02/2013   MCV 97.7 07/02/2013   PLT 64* 07/02/2013      Chemistry      Component Value Date/Time  NA 138 06/04/2013 0857   NA 140 03/07/2012 1043   K 4.0 06/04/2013 0857   K 4.4 03/07/2012 1043   CL 106 12/05/2012 0918   CL 106 03/07/2012 1043   CO2 20* 06/04/2013 0857   CO2 26 03/07/2012 1043   BUN 18.9 06/04/2013 0857   BUN 24* 03/07/2012 1043   CREATININE 1.1 06/04/2013 0857   CREATININE 1.08 03/07/2012 1043      Component Value Date/Time   CALCIUM 8.1* 06/04/2013 0857   CALCIUM 9.0 03/07/2012 1043   ALKPHOS 100 06/04/2013 0857   ALKPHOS 94 03/07/2012 1043   AST 11 06/04/2013 0857   AST 16 03/07/2012 1043   ALT 17 06/04/2013 0857   ALT 19 03/07/2012 1043   BILITOT 0.34 06/04/2013 0857   BILITOT 0.3 03/07/2012 1043      ASSESSMENT & PLAN:  #1 IgG kappa multiple myeloma I plan to hold Revlimid until after his skin biopsy which was scheduled for 07/25/2013 and to reduce the dose to 5 mg, 21 days on, 7 days off. I reducing the dexamethasone to 4 mg once a week due to leg edema. We will continue on Zometa infusion once a month. His light chains and M protein is improving. #2Thrombocytopenia This is likely due to recent treatment. The patient denies recent history of bleeding such as epistaxis, hematuria or hematochezia. He is asymptomatic from the low  platelet count. I will observe for now.  he does not require transfusion now. I will reduce current dose as outlined above.  If the thrombocytopenia gets progressive worse in the future, I might have to delay his treatment or adjust the chemotherapy dose. #3 easy bruising This is due to low platelet count and the fact that he is on antiplatelet agents. We will monitor for signs and symptoms of bleeding. I recommend he discuss with Dr. Earl Gala about reducing aspirin to 81 mg if possible as the patient is also taking Plavix #4 anemia This is likely due to recent treatment. The patient denies recent history of bleeding such as epistaxis, hematuria or hematochezia. He is asymptomatic from the anemia. I will observe for now.  He does not require transfusion now. I will continue the chemotherapy at current dose without dosage adjustment.  If the anemia gets progressive worse in the future, I might have to delay his treatment or adjust the chemotherapy dose. #5 preventive care The patient will continue on calcium and vitamin D. since we start him on Zometa, there is no evidence of osteonecrosis of the jaw. #6 leg edema We will monitored carefully. I am reducing the dexamethasone dose as mentioned above to 1 tablet once a week.  Orders Placed This Encounter  Procedures  . CBC with Differential    Standing Status: Future     Number of Occurrences:      Standing Expiration Date: 03/24/2014   All questions were answered. The patient knows to call the clinic with any problems, questions or concerns. No barriers to learning was detected.    Zanyla Klebba, MD 07/02/2013 9:04 AM

## 2013-07-04 LAB — SPEP & IFE WITH QIG
Albumin ELP: 51.8 % — ABNORMAL LOW (ref 55.8–66.1)
Alpha-2-Globulin: 11.2 % (ref 7.1–11.8)
IgA: 234 mg/dL (ref 68–379)
IgG (Immunoglobin G), Serum: 1340 mg/dL (ref 650–1600)
IgM, Serum: 39 mg/dL — ABNORMAL LOW (ref 41–251)
M-Spike, %: 0.77 g/dL
Total Protein, Serum Electrophoresis: 6.4 g/dL (ref 6.0–8.3)

## 2013-07-04 LAB — KAPPA/LAMBDA LIGHT CHAINS
Kappa free light chain: 9.11 mg/dL — ABNORMAL HIGH (ref 0.33–1.94)
Kappa:Lambda Ratio: 2.33 — ABNORMAL HIGH (ref 0.26–1.65)
Lambda Free Lght Chn: 3.91 mg/dL — ABNORMAL HIGH (ref 0.57–2.63)

## 2013-07-04 NOTE — Telephone Encounter (Signed)
Faxed notification from [facility] that Revlimid was shipped 07/03/13 for next day delivery.

## 2013-07-25 ENCOUNTER — Other Ambulatory Visit: Payer: Self-pay | Admitting: Dermatology

## 2013-07-27 ENCOUNTER — Other Ambulatory Visit: Payer: Self-pay | Admitting: *Deleted

## 2013-07-27 NOTE — Telephone Encounter (Signed)
THIS REFILL REQUEST FOR REVLIMID WAS PLACED IN DR.GORSUCH'S ACTIVE WORK FOLDER. 

## 2013-07-30 ENCOUNTER — Encounter (INDEPENDENT_AMBULATORY_CARE_PROVIDER_SITE_OTHER): Payer: Self-pay

## 2013-07-30 ENCOUNTER — Ambulatory Visit (HOSPITAL_BASED_OUTPATIENT_CLINIC_OR_DEPARTMENT_OTHER): Payer: Medicare Other | Admitting: Hematology and Oncology

## 2013-07-30 ENCOUNTER — Other Ambulatory Visit (HOSPITAL_BASED_OUTPATIENT_CLINIC_OR_DEPARTMENT_OTHER): Payer: Medicare Other

## 2013-07-30 ENCOUNTER — Ambulatory Visit (HOSPITAL_BASED_OUTPATIENT_CLINIC_OR_DEPARTMENT_OTHER): Payer: Medicare Other

## 2013-07-30 ENCOUNTER — Telehealth: Payer: Self-pay | Admitting: Hematology and Oncology

## 2013-07-30 VITALS — BP 166/59 | HR 87 | Temp 98.3°F | Resp 19 | Ht 68.0 in | Wt 204.3 lb

## 2013-07-30 DIAGNOSIS — D539 Nutritional anemia, unspecified: Secondary | ICD-10-CM

## 2013-07-30 DIAGNOSIS — C9000 Multiple myeloma not having achieved remission: Secondary | ICD-10-CM

## 2013-07-30 DIAGNOSIS — D696 Thrombocytopenia, unspecified: Secondary | ICD-10-CM

## 2013-07-30 DIAGNOSIS — Z23 Encounter for immunization: Secondary | ICD-10-CM

## 2013-07-30 DIAGNOSIS — D61818 Other pancytopenia: Secondary | ICD-10-CM

## 2013-07-30 LAB — CBC WITH DIFFERENTIAL/PLATELET
BASO%: 1.1 % (ref 0.0–2.0)
Basophils Absolute: 0.1 10*3/uL (ref 0.0–0.1)
EOS ABS: 0.3 10*3/uL (ref 0.0–0.5)
EOS%: 2.9 % (ref 0.0–7.0)
HCT: 36.7 % — ABNORMAL LOW (ref 38.4–49.9)
HGB: 12.1 g/dL — ABNORMAL LOW (ref 13.0–17.1)
LYMPH%: 14.1 % (ref 14.0–49.0)
MCH: 32.4 pg (ref 27.2–33.4)
MCHC: 33 g/dL (ref 32.0–36.0)
MCV: 98.3 fL — ABNORMAL HIGH (ref 79.3–98.0)
MONO#: 1.6 10*3/uL — AB (ref 0.1–0.9)
MONO%: 17.1 % — ABNORMAL HIGH (ref 0.0–14.0)
NEUT#: 6.2 10*3/uL (ref 1.5–6.5)
NEUT%: 64.8 % (ref 39.0–75.0)
Platelets: 63 10*3/uL — ABNORMAL LOW (ref 140–400)
RBC: 3.73 10*6/uL — AB (ref 4.20–5.82)
RDW: 15.2 % — ABNORMAL HIGH (ref 11.0–14.6)
WBC: 9.5 10*3/uL (ref 4.0–10.3)
lymph#: 1.3 10*3/uL (ref 0.9–3.3)

## 2013-07-30 LAB — BASIC METABOLIC PANEL (CC13)
Anion Gap: 12 mEq/L — ABNORMAL HIGH (ref 3–11)
BUN: 21.2 mg/dL (ref 7.0–26.0)
CALCIUM: 8.8 mg/dL (ref 8.4–10.4)
CO2: 21 mEq/L — ABNORMAL LOW (ref 22–29)
CREATININE: 1.1 mg/dL (ref 0.7–1.3)
Chloride: 106 mEq/L (ref 98–109)
Glucose: 257 mg/dl — ABNORMAL HIGH (ref 70–140)
Potassium: 4.1 mEq/L (ref 3.5–5.1)
Sodium: 139 mEq/L (ref 136–145)

## 2013-07-30 LAB — TECHNOLOGIST REVIEW

## 2013-07-30 MED ORDER — SODIUM CHLORIDE 0.9 % IV SOLN
Freq: Once | INTRAVENOUS | Status: AC
  Administered 2013-07-30: 10:00:00 via INTRAVENOUS

## 2013-07-30 MED ORDER — ZOLEDRONIC ACID 4 MG/5ML IV CONC
3.5000 mg | Freq: Once | INTRAVENOUS | Status: AC
  Administered 2013-07-30: 3.5 mg via INTRAVENOUS
  Filled 2013-07-30: qty 4.38

## 2013-07-30 NOTE — Progress Notes (Signed)
[facility] OFFICE PROGRESS NOTE  Patient Care Team: Horton Finer, MD as PCP - General (Internal Medicine) Heath Lark, MD as Consulting Physician (Hematology and Oncology)  DIAGNOSIS: IgG kappa multiple myeloma  SUMMARY OF ONCOLOGIC HISTORY: IgG kappa multiple myeloma diagnosed in February 2011. Bone marrow aspirate and biopsy on 09/19/2009 showed 48% plasma cells. IgG level at that time was 4910. Serum kappa light chains were 3.90. Beta 2 microglobulin was 3.77. Metastatic bone survey from 09/05/2009 showed no definite lytic lesions. The patient received treatment with subcutaneous Velcade along with melphalan and prednisone from April 2011 through March 2012 with an excellent response to treatment. The patient then was on Revlimid with weekly Decadron. These treatments were apparently started in late January 2013. The patient currently is on Revlimid 10 mg daily for 2 weeks out of every 3 weeks. Due to elevated light chains, the dose of Revlimid was increased again recently. September 2014, he was noted to have progressive pancytopenia. The dose of Revlimid was reduced back to 5 mg  On 05/07/2013 we started him on monthly Zometa On 07/02/2013, we held Revlimid due to thrombocytopenia. We also reduce the dose of Revlimid to 5 mg, 21 days on 7 days off. We plan to restart Revlimid on 07/26/2013 after his skin biopsy and weekly dexamethasone was restarted.  INTERVAL HISTORY: John Carter 80 y.o. male returns for further followup. He has dermatologist removing and not a skin lesion and according to the patient was benign. Her dosage of his aspirin was reduced to 81 mg. He still have easy bruising. The patient denies any recent signs or symptoms of bleeding such as spontaneous epistaxis, hematuria or hematochezia.  I have reviewed the past medical history, past surgical history, social history and family history with the patient and they are unchanged from previous  note.  ALLERGIES:  is allergic to sulfa drugs cross reactors.  MEDICATIONS:  Current Outpatient Prescriptions  Medication Sig Dispense Refill  . amLODipine (NORVASC) 5 MG tablet Take 5 mg by mouth daily.        Marland Kitchen aspirin 81 MG tablet Take 81 mg by mouth daily.      . Calcium-Vitamin D-Vitamin K (VIACTIV PO) Take by mouth daily.      . cetirizine-pseudoephedrine (ZYRTEC-D) 5-120 MG per tablet Take 1 tablet by mouth 2 (two) times daily.       . clopidogrel (PLAVIX) 75 MG tablet Take 75 mg by mouth daily.       Marland Kitchen dexamethasone (DECADRON) 4 MG tablet Take 4 mg by mouth every 7 (seven) days.       . flunisolide (NASAREL) 29 MCG/ACT (0.025%) nasal spray Place 2 sprays into the nose at bedtime. Dose is for each nostril.       . furosemide (LASIX) 20 MG tablet Take 20 mg by mouth daily.      Marland Kitchen gabapentin (NEURONTIN) 300 MG capsule Take 300 mg by mouth at bedtime.      Marland Kitchen lenalidomide (REVLIMID) 5 MG capsule Take 1 capsule (5 mg total) by mouth daily.  21 capsule  0  . metoprolol (TOPROL-XL) 50 MG 24 hr tablet Take 50 mg by mouth daily.        . Multiple Vitamins-Minerals (MULTIVITAMINS THER. W/MINERALS) TABS Take 1 tablet by mouth daily. Centrum silver      . nitroGLYCERIN (NITROSTAT) 0.4 MG SL tablet Place 0.4 mg under the tongue every 5 (five) minutes as needed. For chest pain       .  omeprazole (PRILOSEC) 20 MG capsule Take 20 mg by mouth daily.        . simvastatin (ZOCOR) 20 MG tablet Take 20 mg by mouth at bedtime.        . valsartan (DIOVAN) 320 MG tablet Take 320 mg by mouth daily.       . Vitamin D, Ergocalciferol, (DRISDOL) 50000 UNITS CAPS Take 50,000 Units by mouth every 14 (fourteen) days.        No current facility-administered medications for this visit.    REVIEW OF SYSTEMS:   Constitutional: Denies fevers, chills or abnormal weight loss Eyes: Denies blurriness of vision Ears, nose, mouth, throat, and face: Denies mucositis or sore throat Respiratory: Denies cough, dyspnea or  wheezes Cardiovascular: Denies palpitation, chest discomfort  Gastrointestinal:  Denies nausea, heartburn or change in bowel habits Skin: Denies abnormal skin rashes Lymphatics: Denies new lymphadenopathy  Neurological:Denies numbness, tingling or new weaknesses Behavioral/Psych: Mood is stable, no new changes  All other systems were reviewed with the patient and are negative.  PHYSICAL EXAMINATION: ECOG PERFORMANCE STATUS: 1 - Symptomatic but completely ambulatory  Filed Vitals:   07/30/13 0927  BP: 166/59  Pulse: 87  Temp: 98.3 F (36.8 C)  Resp: 19   Filed Weights   07/30/13 0927  Weight: 204 lb 4.8 oz (92.67 kg)    GENERAL:alert, no distress and comfortable SKIN: skin color, texture, turgor are normal, no rashes or significant lesions. He has very dry skin EYES: normal, Conjunctiva are pink and non-injected, sclera clear OROPHARYNX:no exudate, no erythema and lips, buccal mucosa, and tongue normal  NECK: supple, thyroid normal size, non-tender, without nodularity LYMPH:  no palpable lymphadenopathy in the cervical, axillary or inguinal LUNGS: clear to auscultation and percussion with normal breathing effort HEART: regular rate & rhythm and no murmurs with mild bilateral lower extremity edema ABDOMEN:abdomen soft, non-tender and normal bowel sounds Musculoskeletal:no cyanosis of digits and no clubbing  NEURO: alert & oriented x 3 with fluent speech, no focal motor/sensory deficits  LABORATORY DATA:  I have reviewed the data as listed    Component Value Date/Time   NA 140 07/02/2013 0826   NA 140 03/07/2012 1043   K 4.0 07/02/2013 0826   K 4.4 03/07/2012 1043   CL 106 12/05/2012 0918   CL 106 03/07/2012 1043   CO2 22 07/02/2013 0826   CO2 26 03/07/2012 1043   GLUCOSE 225* 07/02/2013 0826   GLUCOSE 152* 12/05/2012 0918   GLUCOSE 120* 03/07/2012 1043   BUN 25.5 07/02/2013 0826   BUN 24* 03/07/2012 1043   CREATININE 1.1 07/02/2013 0826   CREATININE 1.08 03/07/2012 1043    CALCIUM 8.7 07/02/2013 0826   CALCIUM 9.0 03/07/2012 1043   PROT 7.0 07/02/2013 0826   PROT 6.7 03/07/2012 1043   ALBUMIN 3.2* 07/02/2013 0826   ALBUMIN 3.8 03/07/2012 1043   AST 14 07/02/2013 0826   AST 16 03/07/2012 1043   ALT 18 07/02/2013 0826   ALT 19 03/07/2012 1043   ALKPHOS 84 07/02/2013 0826   ALKPHOS 94 03/07/2012 1043   BILITOT 0.42 07/02/2013 0826   BILITOT 0.3 03/07/2012 1043   GFRNONAA 65* 07/01/2011 0615   GFRAA 75* 07/01/2011 0615    No results found for this basename: SPEP,  UPEP,   kappa and lambda light chains    Lab Results  Component Value Date   WBC 9.5 07/30/2013   NEUTROABS 6.2 07/30/2013   HGB 12.1* 07/30/2013   HCT 36.7* 07/30/2013   MCV 98.3*  07/30/2013   PLT 63* 07/30/2013      Chemistry      Component Value Date/Time   NA 140 07/02/2013 0826   NA 140 03/07/2012 1043   K 4.0 07/02/2013 0826   K 4.4 03/07/2012 1043   CL 106 12/05/2012 0918   CL 106 03/07/2012 1043   CO2 22 07/02/2013 0826   CO2 26 03/07/2012 1043   BUN 25.5 07/02/2013 0826   BUN 24* 03/07/2012 1043   CREATININE 1.1 07/02/2013 0826   CREATININE 1.08 03/07/2012 1043      Component Value Date/Time   CALCIUM 8.7 07/02/2013 0826   CALCIUM 9.0 03/07/2012 1043   ALKPHOS 84 07/02/2013 0826   ALKPHOS 94 03/07/2012 1043   AST 14 07/02/2013 0826   AST 16 03/07/2012 1043   ALT 18 07/02/2013 0826   ALT 19 03/07/2012 1043   BILITOT 0.42 07/02/2013 0826   BILITOT 0.3 03/07/2012 1043     ASSESSMENT & PLAN:  #1 IgG kappa multiple myeloma I plan to continue Revlimid at reduced dose, 5 mg, 21 days on, 7 days off. I reducing the dexamethasone to 4 mg once a week due to leg edema. We will continue on Zometa infusion once a month. His light chains and M protein is improving. He is in VGPR #2Thrombocytopenia This is likely due to recent treatment. The patient denies recent history of bleeding such as epistaxis, hematuria or hematochezia. He is asymptomatic from the low platelet count. I will observe for  now.  he does not require transfusion now. I will reduce current dose as outlined above.  If the thrombocytopenia gets progressive worse in the future, I might have to delay his treatment or adjust the chemotherapy dose. #3 easy bruising This is due to low platelet count and the fact that he is on antiplatelet agents. We will monitor for signs and symptoms of bleeding. He will continue aspirin to 81 mg and Plavix #4 anemia This is likely due to recent treatment. The patient denies recent history of bleeding such as epistaxis, hematuria or hematochezia. He is asymptomatic from the anemia. I will observe for now.  He does not require transfusion now. I will continue the chemotherapy at current dose without dosage adjustment.  If the anemia gets progressive worse in the future, I might have to delay his treatment or adjust the chemotherapy dose. #5 preventive care The patient will continue on calcium and vitamin D. since we start him on Zometa, there is no evidence of osteonecrosis of the jaw. #6 leg edema We will monitored carefully. I am reducing the dexamethasone dose as mentioned above to 1 tablet once a week. He is on Lasix   Orders Placed This Encounter  Procedures  . CBC with Differential    Standing Status: Future     Number of Occurrences:      Standing Expiration Date: 04/21/2014  . Comprehensive metabolic panel    Standing Status: Future     Number of Occurrences:      Standing Expiration Date: 07/30/2014  . Kappa/lambda light chains    Standing Status: Future     Number of Occurrences:      Standing Expiration Date: 07/30/2014  . SPEP & IFE with QIG    Standing Status: Future     Number of Occurrences:      Standing Expiration Date: 07/30/2014   All questions were answered. The patient knows to call the clinic with any problems, questions or concerns. No barriers to learning  was detected. I spent 25 minutes counseling the patient face to face. The total time spent in the  appointment was 40 minutes and more than 50% was on counseling and review of test results     [facility], [place], MD 07/30/2013 9:50 AM

## 2013-07-30 NOTE — Patient Instructions (Signed)

## 2013-07-30 NOTE — Telephone Encounter (Signed)
gv and printed appt sched and avs for pt for Feb...sed added tx.   °

## 2013-08-01 ENCOUNTER — Telehealth: Payer: Self-pay | Admitting: *Deleted

## 2013-08-01 DIAGNOSIS — C9000 Multiple myeloma not having achieved remission: Secondary | ICD-10-CM

## 2013-08-01 DIAGNOSIS — D61818 Other pancytopenia: Secondary | ICD-10-CM

## 2013-08-01 DIAGNOSIS — Z23 Encounter for immunization: Secondary | ICD-10-CM

## 2013-08-01 NOTE — Telephone Encounter (Signed)
Spoke with pt today about Revlimid refill.  Per pt, he had started new cycle on  Jan. 10, 2015 for 21 days.  Pt had a break in December due to a mole surgical procedure.  Pt will complete this cycle of Revlimid on  Jan. 30. Instructed by to call [employer] to take survey on 08/20/13;  Pt to call office and let nurse know that he had taken patient survey.   New cycle to be started on 08/25/13 per pt.

## 2013-08-10 MED ORDER — LENALIDOMIDE 5 MG PO CAPS: 5.0000 mg | ORAL_CAPSULE | Freq: Every day | ORAL | Status: DC

## 2013-08-10 NOTE — Addendum Note (Signed)
Addended by: Wyonia Hough on: 08/10/2013 11:16 AM   Modules accepted: Orders

## 2013-08-22 NOTE — Telephone Encounter (Signed)
[facility] sent facsimile confirmation of Revlimid prescription shipment.  Revlimid was shipped on 08-21-2013 with next business day delivery.

## 2013-08-27 ENCOUNTER — Ambulatory Visit (HOSPITAL_BASED_OUTPATIENT_CLINIC_OR_DEPARTMENT_OTHER): Payer: Medicare Other

## 2013-08-27 ENCOUNTER — Ambulatory Visit (HOSPITAL_BASED_OUTPATIENT_CLINIC_OR_DEPARTMENT_OTHER): Payer: Medicare Other | Admitting: Hematology and Oncology

## 2013-08-27 ENCOUNTER — Other Ambulatory Visit (HOSPITAL_BASED_OUTPATIENT_CLINIC_OR_DEPARTMENT_OTHER): Payer: Medicare Other

## 2013-08-27 ENCOUNTER — Telehealth: Payer: Self-pay | Admitting: Hematology and Oncology

## 2013-08-27 ENCOUNTER — Encounter: Payer: Self-pay | Admitting: Hematology and Oncology

## 2013-08-27 VITALS — BP 160/59 | HR 87 | Temp 97.0°F | Resp 18 | Ht 68.0 in | Wt 201.7 lb

## 2013-08-27 DIAGNOSIS — D649 Anemia, unspecified: Secondary | ICD-10-CM

## 2013-08-27 DIAGNOSIS — C9000 Multiple myeloma not having achieved remission: Secondary | ICD-10-CM

## 2013-08-27 DIAGNOSIS — M25569 Pain in unspecified knee: Secondary | ICD-10-CM

## 2013-08-27 DIAGNOSIS — I10 Essential (primary) hypertension: Secondary | ICD-10-CM

## 2013-08-27 DIAGNOSIS — D539 Nutritional anemia, unspecified: Secondary | ICD-10-CM

## 2013-08-27 DIAGNOSIS — D696 Thrombocytopenia, unspecified: Secondary | ICD-10-CM

## 2013-08-27 DIAGNOSIS — R609 Edema, unspecified: Secondary | ICD-10-CM

## 2013-08-27 HISTORY — DX: Pain in unspecified knee: M25.569

## 2013-08-27 LAB — CBC WITH DIFFERENTIAL/PLATELET
BASO%: 0.7 % (ref 0.0–2.0)
BASOS ABS: 0.1 10*3/uL (ref 0.0–0.1)
EOS%: 1.7 % (ref 0.0–7.0)
Eosinophils Absolute: 0.1 10*3/uL (ref 0.0–0.5)
HCT: 35.5 % — ABNORMAL LOW (ref 38.4–49.9)
HEMOGLOBIN: 11.8 g/dL — AB (ref 13.0–17.1)
LYMPH#: 1.6 10*3/uL (ref 0.9–3.3)
LYMPH%: 18.3 % (ref 14.0–49.0)
MCH: 32.6 pg (ref 27.2–33.4)
MCHC: 33.3 g/dL (ref 32.0–36.0)
MCV: 97.9 fL (ref 79.3–98.0)
MONO#: 2.3 10*3/uL — AB (ref 0.1–0.9)
MONO%: 27.3 % — ABNORMAL HIGH (ref 0.0–14.0)
NEUT#: 4.4 10*3/uL (ref 1.5–6.5)
NEUT%: 52 % (ref 39.0–75.0)
Platelets: 63 10*3/uL — ABNORMAL LOW (ref 140–400)
RBC: 3.63 10*6/uL — ABNORMAL LOW (ref 4.20–5.82)
RDW: 14.8 % — ABNORMAL HIGH (ref 11.0–14.6)
WBC: 8.5 10*3/uL (ref 4.0–10.3)

## 2013-08-27 LAB — COMPREHENSIVE METABOLIC PANEL (CC13)
ALBUMIN: 3.4 g/dL — AB (ref 3.5–5.0)
ALT: 28 U/L (ref 0–55)
AST: 17 U/L (ref 5–34)
Alkaline Phosphatase: 80 U/L (ref 40–150)
Anion Gap: 10 mEq/L (ref 3–11)
BUN: 26.7 mg/dL — ABNORMAL HIGH (ref 7.0–26.0)
CALCIUM: 7.5 mg/dL — AB (ref 8.4–10.4)
CHLORIDE: 111 meq/L — AB (ref 98–109)
CO2: 20 mEq/L — ABNORMAL LOW (ref 22–29)
Creatinine: 1.2 mg/dL (ref 0.7–1.3)
Glucose: 195 mg/dl — ABNORMAL HIGH (ref 70–140)
Potassium: 3.7 mEq/L (ref 3.5–5.1)
Sodium: 141 mEq/L (ref 136–145)
Total Bilirubin: 0.33 mg/dL (ref 0.20–1.20)
Total Protein: 6.8 g/dL (ref 6.4–8.3)

## 2013-08-27 LAB — TECHNOLOGIST REVIEW: Technologist Review: 2

## 2013-08-27 MED ORDER — ZOLEDRONIC ACID 4 MG/5ML IV CONC
3.5000 mg | Freq: Once | INTRAVENOUS | Status: AC
  Administered 2013-08-27: 3.5 mg via INTRAVENOUS
  Filled 2013-08-27: qty 4.38

## 2013-08-27 MED ORDER — SODIUM CHLORIDE 0.9 % IV SOLN
Freq: Once | INTRAVENOUS | Status: AC
  Administered 2013-08-27: 10:00:00 via INTRAVENOUS

## 2013-08-27 MED ORDER — TRAMADOL HCL 50 MG PO TABS: 50.0000 mg | ORAL_TABLET | Freq: Four times a day (QID) | ORAL | Status: DC | PRN

## 2013-08-27 NOTE — Telephone Encounter (Signed)
Gave pt appt for lab and Md , emailed [place] regarding Zometa

## 2013-08-27 NOTE — Patient Instructions (Signed)
TODAY YOU RECIEVED Zoledronic Acid injection (Hypercalcemia, Oncology) What is this medicine? ZOLEDRONIC ACID (ZOE le dron ik AS id) lowers the amount of calcium loss from bone. It is used to treat too much calcium in your blood from cancer. It is also used to prevent complications of cancer that has spread to the bone. This medicine may be used for other purposes; ask your health care provider or pharmacist if you have questions. COMMON BRAND NAME(S): Zometa What should I tell my health care provider before I take this medicine? They need to know if you have any of these conditions: -aspirin-sensitive asthma -cancer, especially if you are receiving medicines used to treat cancer -dental disease or wear dentures -infection -kidney disease -receiving corticosteroids like dexamethasone or prednisone -an unusual or allergic reaction to zoledronic acid, other medicines, foods, dyes, or preservatives -pregnant or trying to get pregnant -breast-feeding How should I use this medicine? This medicine is for infusion into a vein. It is given by a health care professional in a hospital or clinic setting. Talk to your pediatrician regarding the use of this medicine in children. Special care may be needed. Overdosage: If you think you have taken too much of this medicine contact a poison control center or emergency room at once. NOTE: This medicine is only for you. Do not share this medicine with others. What if I miss a dose? It is important not to miss your dose. Call your doctor or health care professional if you are unable to keep an appointment. What may interact with this medicine? -certain antibiotics given by injection -NSAIDs, medicines for pain and inflammation, like ibuprofen or naproxen -some diuretics like bumetanide, furosemide -teriparatide -thalidomide This list may not describe all possible interactions. Give your health care provider a list of all the medicines, herbs,  non-prescription drugs, or dietary supplements you use. Also tell them if you smoke, drink alcohol, or use illegal drugs. Some items may interact with your medicine. What should I watch for while using this medicine? Visit your doctor or health care professional for regular checkups. It may be some time before you see the benefit from this medicine. Do not stop taking your medicine unless your doctor tells you to. Your doctor may order blood tests or other tests to see how you are doing. Women should inform their doctor if they wish to become pregnant or think they might be pregnant. There is a potential for serious side effects to an unborn child. Talk to your health care professional or pharmacist for more information. You should make sure that you get enough calcium and vitamin D while you are taking this medicine. Discuss the foods you eat and the vitamins you take with your health care professional. Some people who take this medicine have severe bone, joint, and/or muscle pain. This medicine may also increase your risk for jaw problems or a broken thigh bone. Tell your doctor right away if you have severe pain in your jaw, bones, joints, or muscles. Tell your doctor if you have any pain that does not go away or that gets worse. Tell your dentist and dental surgeon that you are taking this medicine. You should not have major dental surgery while on this medicine. See your dentist to have a dental exam and fix any dental problems before starting this medicine. Take good care of your teeth while on this medicine. Make sure you see your dentist for regular follow-up appointments. What side effects may I notice from receiving this medicine?  Side effects that you should report to your doctor or health care professional as soon as possible: -allergic reactions like skin rash, itching or hives, swelling of the face, lips, or tongue -anxiety, confusion, or depression -breathing problems -changes in  vision -eye pain -feeling faint or lightheaded, falls -jaw pain, especially after dental work -mouth sores -muscle cramps, stiffness, or weakness -trouble passing urine or change in the amount of urine Side effects that usually do not require medical attention (report to your doctor or health care professional if they continue or are bothersome): -bone, joint, or muscle pain -constipation -diarrhea -fever -hair loss -irritation at site where injected -loss of appetite -nausea, vomiting -stomach upset -trouble sleeping -trouble swallowing -weak or tired This list may not describe all possible side effects. Call your doctor for medical advice about side effects. You may report side effects to FDA at 1-800-FDA-1088. Where should I keep my medicine? This drug is given in a hospital or clinic and will not be stored at home. NOTE: This sheet is a summary. It may not cover all possible information. If you have questions about this medicine, talk to your doctor, pharmacist, or health care provider.  2014, Elsevier/Gold Standard. (2012-12-14 13:03:13)  WE SUGGEST ADDING 1 EXTRA TUMS TO YOUR DAILY MEDICATIONS TO MAINTAIN YOUR CALCIUM LEVELS. PLEASE SEE SIGNS AND SYMPTOMS OF LOW CALCIUM AND CALL THE OFFICE @ [phone] IF YOU ARE CONCERNED WITH ANY OF THESE!!  Hypocalcemia, Adult Hypocalcemia is low blood calcium. Calcium is important for cells to function in the body. Low blood calcium can cause a variety of symptoms and problems. CAUSES   Low levels of a body protein called albumin.  Problems with the parathyroid glands or surgical removal of the parathyroid glands. The parathyroid glands maintain the body's level of calcium.  Decreased production or improper use of parathyroid hormone.  Lack (deficiency) of vitamin D or magnesium or both.  Intestinal problems that interfere with nutrient absorption.  Alcoholism.  Kidney problems.  Inflammation of the pancreas  (pancreatitis).  Certain medicines.  Severe infections (sepsis).  Infiltrative diseases. With these diseases the parathyroid glands are filled with cells or substances that are not normally present. Examples include:  Sarcoidosis.  Hemachromatosis.  Breakdown of large amounts of muscle fiber.  High levels of phosphate in the body.  Cancer.  Massive blood transfusions which usually occur with severe trauma. SYMPTOMS   Numbness and tingling in the fingers, toes, or around the mouth.  Muscle aches or cramps, especially in the legs, feet, and back.  Muscle twitches.  Shortness of breath or wheezing.  Difficulty swallowing.  Changes in the sound of the voice.  General weakness.  Fainting.  Fast heart beats (palpitations).  Chest pain.  Irritability.  Difficulty thinking.  Memory problems or confusion.  Severe fatigue.  Changes in personality.  Depression and anxiety.  Shaking uncontrollably (seizures).  Coarse, brittle hair and nails.  Dry skin or lasting (chronic) skin diseases (psoriasis, eczema, or dermatitis).  Clouding of the eye lens (cataracts).  Abdominal cramping or pain. DIAGNOSIS  Hypocalcemia is usually diagnosed through blood tests that reveal a low level of blood calcium. Other tests, such as a recording of the electrical activity of the heart (electrocardiogram, EKG), may be performed in order to diagnose the underlying cause of the condition. TREATMENT  Treatment for hypocalcemia includes giving calcium supplements. These can be given by mouth or by intravenous (IV) access tube, depending on the severity of the symptoms and deficiency. Other minerals (electrolytes),  such as magnesium, may also be given. HOME CARE INSTRUCTIONS   Meet with a dietitian to make sure you are eating the most healthful diet possible, or follow diet instructions as directed by your caregiver.  Follow up with your caregiver as directed. SEEK IMMEDIATE MEDICAL  CARE IF:   You develop chest pain.  You develop persistent rapid or irregular heartbeats.  You have difficulty breathing.  You faint.  You develop increased fatigue.  You have new swelling in the feet, ankles, or legs.  You develop increased muscle twitching.  You start to have seizures.  You develop confusion.  You develop mood, memory, or personality changes. MAKE SURE YOU:   Understand these instructions.  Will watch your condition.  Will get help right away if you are not doing well or get worse. Document Released: 12/23/2009 Document Revised: 09/27/2011 Document Reviewed: 12/23/2009 [facility] Patient Information 2014 [employer].

## 2013-08-27 NOTE — Progress Notes (Signed)
[facility] OFFICE PROGRESS NOTE  Patient Care Team: Darnelle Bos, MD as PCP - General (Internal Medicine) Artis Delay, MD as Consulting Physician (Hematology and Oncology)  DIAGNOSIS: IgG kappa multiple myeloma  SUMMARY OF ONCOLOGIC HISTORY: IgG kappa multiple myeloma diagnosed in February 2011. Bone marrow aspirate and biopsy on 09/19/2009 showed 48% plasma cells. IgG level at that time was 4910. Serum kappa light chains were 3.90. Beta 2 microglobulin was 3.77. Metastatic bone survey from 09/05/2009 showed no definite lytic lesions. The patient received treatment with subcutaneous Velcade along with melphalan and prednisone from April 2011 through March 2012 with an excellent response to treatment. The patient then was on Revlimid with weekly Decadron. These treatments were apparently started in late January 2013. The patient currently is on Revlimid 10 mg daily for 2 weeks out of every 3 weeks. Due to elevated light chains, the dose of Revlimid was increased again recently. September 2014, he was noted to have progressive pancytopenia. The dose of Revlimid was reduced back to 5 mg  On 05/07/2013 we started him on monthly Zometa On 07/02/2013, we held Revlimid due to thrombocytopenia. We also reduce the dose of Revlimid to 5 mg, 21 days on 7 days off. We plan to restart Revlimid on 07/26/2013 after his skin biopsy and weekly dexamethasone was restarted.  INTERVAL HISTORY: John Carter 80 y.o. male returns for further followup. His only new complaints are recent diarrhea 3 days ago as well as acute right knee pain. He has severe degenerative arthritis in his knees for a while and for some unknown reason have acute exacerbation of the right knee. He stated that regular Tylenol is not helping. History of easy bruising but denies any spontaneous bleeding such as epistaxis, hematuria or hematochezia. His diarrhea is resolving with Imodium as needed. He denies any pain  in his abdomen. He denies any recent fever, chills, night sweats or abnormal weight loss  I have reviewed the past medical history, past surgical history, social history and family history with the patient and they are unchanged from previous note.  ALLERGIES:  is allergic to sulfa drugs cross reactors.  MEDICATIONS:  Current Outpatient Prescriptions  Medication Sig Dispense Refill  . amLODipine (NORVASC) 5 MG tablet Take 5 mg by mouth daily.        Marland Kitchen aspirin 81 MG tablet Take 81 mg by mouth daily.      . Calcium-Vitamin D-Vitamin K (VIACTIV PO) Take by mouth daily.      . cetirizine-pseudoephedrine (ZYRTEC-D) 5-120 MG per tablet Take 1 tablet by mouth 2 (two) times daily.       . clopidogrel (PLAVIX) 75 MG tablet Take 75 mg by mouth daily.       Marland Kitchen dexamethasone (DECADRON) 4 MG tablet Take 4 mg by mouth every 7 (seven) days.       . flunisolide (NASAREL) 29 MCG/ACT (0.025%) nasal spray Place 2 sprays into the nose at bedtime. Dose is for each nostril.       . furosemide (LASIX) 20 MG tablet Take 20 mg by mouth daily.      Marland Kitchen gabapentin (NEURONTIN) 300 MG capsule Take 300 mg by mouth at bedtime.      Marland Kitchen lenalidomide (REVLIMID) 5 MG capsule Take 1 capsule (5 mg total) by mouth daily.  21 capsule  0  . metoprolol (TOPROL-XL) 50 MG 24 hr tablet Take 50 mg by mouth daily.        . Multiple Vitamins-Minerals (MULTIVITAMINS THER.  W/MINERALS) TABS Take 1 tablet by mouth daily. Centrum silver      . nitroGLYCERIN (NITROSTAT) 0.4 MG SL tablet Place 0.4 mg under the tongue every 5 (five) minutes as needed. For chest pain       . omeprazole (PRILOSEC) 20 MG capsule Take 20 mg by mouth daily.        . simvastatin (ZOCOR) 20 MG tablet Take 20 mg by mouth at bedtime.        . valsartan (DIOVAN) 320 MG tablet Take 320 mg by mouth daily.       . Vitamin D, Ergocalciferol, (DRISDOL) 50000 UNITS CAPS Take 50,000 Units by mouth every 14 (fourteen) days.       . traMADol (ULTRAM) 50 MG tablet Take 1 tablet (50  mg total) by mouth every 6 (six) hours as needed.  60 tablet  0   No current facility-administered medications for this visit.    REVIEW OF SYSTEMS:   Constitutional: Denies fevers, chills or abnormal weight loss Eyes: Denies blurriness of vision Ears, nose, mouth, throat, and face: Denies mucositis or sore throat Respiratory: Denies cough, dyspnea or wheezes Cardiovascular: Denies palpitation, chest discomfort or lower extremity swelling Gastrointestinal:  Denies nausea, heartburn or change in bowel habits Skin: Denies abnormal skin rashes Lymphatics: Denies new lymphadenopathy  Neurological:Denies numbness, tingling or new weaknesses Behavioral/Psych: Mood is stable, no new changes  All other systems were reviewed with the patient and are negative.  PHYSICAL EXAMINATION: ECOG PERFORMANCE STATUS: 1 - Symptomatic but completely ambulatory  Filed Vitals:   08/27/13 0835  BP: 160/59  Pulse: 87  Temp: 97 F (36.1 C)  Resp: 18   Filed Weights   08/27/13 0835  Weight: 201 lb 11.2 oz (91.491 kg)    GENERAL:alert, no distress and comfortable. He looked elderly but no distress SKIN: skin color, texture, turgor are normal, no rashes or significant lesions EYES: normal, Conjunctiva are pink and non-injected, sclera clear OROPHARYNX:no exudate, no erythema and lips, buccal mucosa, and tongue normal  NECK: supple, thyroid normal size, non-tender, without nodularity LYMPH:  no palpable lymphadenopathy in the cervical, axillary or inguinal LUNGS: clear to auscultation and percussion with normal breathing effort HEART: regular rate & rhythm and no murmurs and no lower extremity edema ABDOMEN:abdomen soft, non-tender and normal bowel sounds Musculoskeletal:no cyanosis of digits and no clubbing . No acute swelling of the right knee NEURO: alert & oriented x 3 with fluent speech, no focal motor/sensory deficits  LABORATORY DATA:  I have reviewed the data as listed    Component Value  Date/Time   NA 139 07/30/2013 0911   NA 140 03/07/2012 1043   K 4.1 07/30/2013 0911   K 4.4 03/07/2012 1043   CL 106 12/05/2012 0918   CL 106 03/07/2012 1043   CO2 21* 07/30/2013 0911   CO2 26 03/07/2012 1043   GLUCOSE 257* 07/30/2013 0911   GLUCOSE 152* 12/05/2012 0918   GLUCOSE 120* 03/07/2012 1043   BUN 21.2 07/30/2013 0911   BUN 24* 03/07/2012 1043   CREATININE 1.1 07/30/2013 0911   CREATININE 1.08 03/07/2012 1043   CALCIUM 8.8 07/30/2013 0911   CALCIUM 9.0 03/07/2012 1043   PROT 7.0 07/02/2013 0826   PROT 6.7 03/07/2012 1043   ALBUMIN 3.2* 07/02/2013 0826   ALBUMIN 3.8 03/07/2012 1043   AST 14 07/02/2013 0826   AST 16 03/07/2012 1043   ALT 18 07/02/2013 0826   ALT 19 03/07/2012 1043   ALKPHOS 84 07/02/2013 0826  ALKPHOS 94 03/07/2012 1043   BILITOT 0.42 07/02/2013 0826   BILITOT 0.3 03/07/2012 1043   GFRNONAA 65* 07/01/2011 0615   GFRAA 75* 07/01/2011 0615    No results found for this basename: SPEP, UPEP,  kappa and lambda light chains    Lab Results  Component Value Date   WBC 8.5 08/27/2013   NEUTROABS 4.4 08/27/2013   HGB 11.8* 08/27/2013   HCT 35.5* 08/27/2013   MCV 97.9 08/27/2013   PLT 63* 08/27/2013      Chemistry      Component Value Date/Time   NA 139 07/30/2013 0911   NA 140 03/07/2012 1043   K 4.1 07/30/2013 0911   K 4.4 03/07/2012 1043   CL 106 12/05/2012 0918   CL 106 03/07/2012 1043   CO2 21* 07/30/2013 0911   CO2 26 03/07/2012 1043   BUN 21.2 07/30/2013 0911   BUN 24* 03/07/2012 1043   CREATININE 1.1 07/30/2013 0911   CREATININE 1.08 03/07/2012 1043      Component Value Date/Time   CALCIUM 8.8 07/30/2013 0911   CALCIUM 9.0 03/07/2012 1043   ALKPHOS 84 07/02/2013 0826   ALKPHOS 94 03/07/2012 1043   AST 14 07/02/2013 0826   AST 16 03/07/2012 1043   ALT 18 07/02/2013 0826   ALT 19 03/07/2012 1043   BILITOT 0.42 07/02/2013 0826   BILITOT 0.3 03/07/2012 1043     ASSESSMENT & PLAN:  #1 IgG kappa multiple myeloma I plan to continue Revlimid at reduced dose, 5 mg, 21 days on, 7  days off. I am reducing the dexamethasone to 2 mg once a week due to leg edema. We will continue on Zometa infusion once a month. His light chains and M protein is improving. He is in VGPR #2Thrombocytopenia This is likely due to recent treatment. The patient denies recent history of bleeding such as epistaxis, hematuria or hematochezia. He is asymptomatic from the low platelet count. I will observe for now.  he does not require transfusion now. I will reduce current dose as outlined above.  If the thrombocytopenia gets progressive worse in the future, I might have to delay his treatment or adjust the chemotherapy dose. #3 easy bruising This is due to low platelet count and the fact that he is on antiplatelet agents. We will monitor for signs and symptoms of bleeding. He will continue aspirin to 81 mg and Plavix #4 anemia This is likely due to recent treatment. The patient denies recent history of bleeding such as epistaxis, hematuria or hematochezia. He is asymptomatic from the anemia. I will observe for now.  He does not require transfusion now. I will continue the chemotherapy at current dose without dosage adjustment.  If the anemia gets progressive worse in the future, I might have to delay his treatment or adjust the chemotherapy dose. #5 preventive care The patient will continue on calcium and vitamin D. since we start him on Zometa, there is no evidence of osteonecrosis of the jaw. #6 leg edema, resolving We will monitored carefully. I am reducing the dexamethasone dose as mentioned above to 1 tablet once a week. He is on Lasix #7 severe hypertension I recommend he takes valsartan at nighttime without changing the dosage of his blood pressure medications and see if this would help. He is not symptomatic #8 severe right knee pain I give him a small prescription of tramadol to take as needed and warned him about expected side effects from it.  Orders Placed This Encounter  Procedures  .  CBC with Differential    Standing Status: Future     Number of Occurrences:      Standing Expiration Date: 08/27/2014  . Comprehensive metabolic panel    Standing Status: Future     Number of Occurrences:      Standing Expiration Date: 08/27/2014  . SPEP & IFE with QIG    Standing Status: Future     Number of Occurrences:      Standing Expiration Date: 08/27/2014  . Kappa/lambda light chains    Standing Status: Future     Number of Occurrences:      Standing Expiration Date: 08/27/2014   All questions were answered. The patient knows to call the clinic with any problems, questions or concerns. No barriers to learning was detected.    Papaikou, Deloit, MD 08/27/2013 8:58 AM

## 2013-08-28 ENCOUNTER — Telehealth: Payer: Self-pay | Admitting: *Deleted

## 2013-08-28 NOTE — Telephone Encounter (Signed)
Per staff message and POF I have scheduled appts.  JMW  

## 2013-08-29 LAB — SPEP & IFE WITH QIG
ALBUMIN ELP: 52.2 % — AB (ref 55.8–66.1)
ALPHA-1-GLOBULIN: 5.7 % — AB (ref 2.9–4.9)
Alpha-2-Globulin: 10.9 % (ref 7.1–11.8)
Beta 2: 3.4 % (ref 3.2–6.5)
Beta Globulin: 6.6 % (ref 4.7–7.2)
GAMMA GLOBULIN: 21.2 % — AB (ref 11.1–18.8)
IGM, SERUM: 31 mg/dL — AB (ref 41–251)
IgA: 191 mg/dL (ref 68–379)
IgG (Immunoglobin G), Serum: 1590 mg/dL (ref 650–1600)
M-Spike, %: 1.04 g/dL
Total Protein, Serum Electrophoresis: 6.6 g/dL (ref 6.0–8.3)

## 2013-08-29 LAB — KAPPA/LAMBDA LIGHT CHAINS
Kappa free light chain: 10.4 mg/dL — ABNORMAL HIGH (ref 0.33–1.94)
Kappa:Lambda Ratio: 3.1 — ABNORMAL HIGH (ref 0.26–1.65)
Lambda Free Lght Chn: 3.36 mg/dL — ABNORMAL HIGH (ref 0.57–2.63)

## 2013-08-30 ENCOUNTER — Inpatient Hospital Stay (HOSPITAL_COMMUNITY): Payer: Medicare Other

## 2013-08-30 ENCOUNTER — Emergency Department (HOSPITAL_COMMUNITY): Payer: Medicare Other

## 2013-08-30 ENCOUNTER — Telehealth: Payer: Self-pay | Admitting: Hematology and Oncology

## 2013-08-30 ENCOUNTER — Encounter (HOSPITAL_COMMUNITY): Payer: Self-pay | Admitting: Emergency Medicine

## 2013-08-30 ENCOUNTER — Other Ambulatory Visit: Payer: Self-pay

## 2013-08-30 ENCOUNTER — Inpatient Hospital Stay (HOSPITAL_COMMUNITY)
Admission: EM | Admit: 2013-08-30 | Discharge: 2013-09-05 | DRG: 052 | Disposition: A | Discharge status: Skilled Nursing Facility | Payer: Medicare Other | Attending: Surgery | Admitting: Surgery

## 2013-08-30 DIAGNOSIS — K219 Gastro-esophageal reflux disease without esophagitis: Secondary | ICD-10-CM | POA: Diagnosis present

## 2013-08-30 DIAGNOSIS — M459 Ankylosing spondylitis of unspecified sites in spine: Secondary | ICD-10-CM | POA: Diagnosis present

## 2013-08-30 DIAGNOSIS — W010XXA Fall on same level from slipping, tripping and stumbling without subsequent striking against object, initial encounter: Secondary | ICD-10-CM | POA: Diagnosis present

## 2013-08-30 DIAGNOSIS — C9000 Multiple myeloma not having achieved remission: Secondary | ICD-10-CM | POA: Diagnosis present

## 2013-08-30 DIAGNOSIS — I2510 Atherosclerotic heart disease of native coronary artery without angina pectoris: Secondary | ICD-10-CM | POA: Diagnosis present

## 2013-08-30 DIAGNOSIS — E785 Hyperlipidemia, unspecified: Secondary | ICD-10-CM | POA: Diagnosis present

## 2013-08-30 DIAGNOSIS — Z882 Allergy status to sulfonamides status: Secondary | ICD-10-CM

## 2013-08-30 DIAGNOSIS — G8382 Anterior cord syndrome: Secondary | ICD-10-CM

## 2013-08-30 DIAGNOSIS — S14121A Central cord syndrome at C1 level of cervical spinal cord, initial encounter: Principal | ICD-10-CM | POA: Diagnosis present

## 2013-08-30 DIAGNOSIS — Z8673 Personal history of transient ischemic attack (TIA), and cerebral infarction without residual deficits: Secondary | ICD-10-CM

## 2013-08-30 DIAGNOSIS — G8929 Other chronic pain: Secondary | ICD-10-CM

## 2013-08-30 DIAGNOSIS — M549 Dorsalgia, unspecified: Secondary | ICD-10-CM

## 2013-08-30 DIAGNOSIS — D649 Anemia, unspecified: Secondary | ICD-10-CM | POA: Insufficient documentation

## 2013-08-30 DIAGNOSIS — IMO0002 Reserved for concepts with insufficient information to code with codable children: Secondary | ICD-10-CM

## 2013-08-30 DIAGNOSIS — G8389 Other specified paralytic syndromes: Secondary | ICD-10-CM

## 2013-08-30 DIAGNOSIS — F068 Other specified mental disorders due to known physiological condition: Secondary | ICD-10-CM

## 2013-08-30 DIAGNOSIS — Z9861 Coronary angioplasty status: Secondary | ICD-10-CM

## 2013-08-30 DIAGNOSIS — W19XXXA Unspecified fall, initial encounter: Secondary | ICD-10-CM | POA: Diagnosis present

## 2013-08-30 DIAGNOSIS — Z794 Long term (current) use of insulin: Secondary | ICD-10-CM

## 2013-08-30 DIAGNOSIS — S0003XA Contusion of scalp, initial encounter: Secondary | ICD-10-CM | POA: Diagnosis present

## 2013-08-30 DIAGNOSIS — D61818 Other pancytopenia: Secondary | ICD-10-CM | POA: Diagnosis present

## 2013-08-30 DIAGNOSIS — Z833 Family history of diabetes mellitus: Secondary | ICD-10-CM

## 2013-08-30 DIAGNOSIS — D696 Thrombocytopenia, unspecified: Secondary | ICD-10-CM | POA: Diagnosis present

## 2013-08-30 DIAGNOSIS — Z96659 Presence of unspecified artificial knee joint: Secondary | ICD-10-CM

## 2013-08-30 DIAGNOSIS — S0083XA Contusion of other part of head, initial encounter: Secondary | ICD-10-CM

## 2013-08-30 DIAGNOSIS — S14129A Central cord syndrome at unspecified level of cervical spinal cord, initial encounter: Secondary | ICD-10-CM | POA: Diagnosis present

## 2013-08-30 DIAGNOSIS — Z85820 Personal history of malignant melanoma of skin: Secondary | ICD-10-CM

## 2013-08-30 DIAGNOSIS — S022XXA Fracture of nasal bones, initial encounter for closed fracture: Secondary | ICD-10-CM | POA: Diagnosis present

## 2013-08-30 DIAGNOSIS — Z8249 Family history of ischemic heart disease and other diseases of the circulatory system: Secondary | ICD-10-CM

## 2013-08-30 DIAGNOSIS — I252 Old myocardial infarction: Secondary | ICD-10-CM

## 2013-08-30 DIAGNOSIS — E1169 Type 2 diabetes mellitus with other specified complication: Secondary | ICD-10-CM | POA: Diagnosis present

## 2013-08-30 DIAGNOSIS — F04 Amnestic disorder due to known physiological condition: Secondary | ICD-10-CM | POA: Diagnosis present

## 2013-08-30 DIAGNOSIS — G4730 Sleep apnea, unspecified: Secondary | ICD-10-CM | POA: Diagnosis present

## 2013-08-30 DIAGNOSIS — I4891 Unspecified atrial fibrillation: Secondary | ICD-10-CM | POA: Diagnosis present

## 2013-08-30 DIAGNOSIS — I10 Essential (primary) hypertension: Secondary | ICD-10-CM | POA: Diagnosis present

## 2013-08-30 DIAGNOSIS — N319 Neuromuscular dysfunction of bladder, unspecified: Secondary | ICD-10-CM | POA: Diagnosis present

## 2013-08-30 DIAGNOSIS — M4802 Spinal stenosis, cervical region: Secondary | ICD-10-CM | POA: Diagnosis present

## 2013-08-30 DIAGNOSIS — M4712 Other spondylosis with myelopathy, cervical region: Secondary | ICD-10-CM | POA: Diagnosis present

## 2013-08-30 DIAGNOSIS — S1093XA Contusion of unspecified part of neck, initial encounter: Secondary | ICD-10-CM

## 2013-08-30 LAB — CBC WITH DIFFERENTIAL/PLATELET
BASOS PCT: 0 % (ref 0–1)
Basophils Absolute: 0 10*3/uL (ref 0.0–0.1)
Eosinophils Absolute: 0.2 10*3/uL (ref 0.0–0.7)
Eosinophils Relative: 2 % (ref 0–5)
HCT: 31.8 % — ABNORMAL LOW (ref 39.0–52.0)
Hemoglobin: 10.7 g/dL — ABNORMAL LOW (ref 13.0–17.0)
LYMPHS ABS: 1.5 10*3/uL (ref 0.7–4.0)
Lymphocytes Relative: 16 % (ref 12–46)
MCH: 32.3 pg (ref 26.0–34.0)
MCHC: 33.6 g/dL (ref 30.0–36.0)
MCV: 96.1 fL (ref 78.0–100.0)
Monocytes Absolute: 2.7 10*3/uL — ABNORMAL HIGH (ref 0.1–1.0)
Monocytes Relative: 29 % — ABNORMAL HIGH (ref 3–12)
Neutro Abs: 4.9 10*3/uL (ref 1.7–7.7)
Neutrophils Relative %: 52 % (ref 43–77)
Platelets: 57 10*3/uL — ABNORMAL LOW (ref 150–400)
RBC: 3.31 MIL/uL — AB (ref 4.22–5.81)
RDW: 14.4 % (ref 11.5–15.5)
WBC: 9.4 10*3/uL (ref 4.0–10.5)

## 2013-08-30 LAB — COMPREHENSIVE METABOLIC PANEL
ALBUMIN: 3.2 g/dL — AB (ref 3.5–5.2)
ALT: 23 U/L (ref 0–53)
AST: 21 U/L (ref 0–37)
Alkaline Phosphatase: 85 U/L (ref 39–117)
BUN: 17 mg/dL (ref 6–23)
CO2: 23 mEq/L (ref 19–32)
Calcium: 7.4 mg/dL — ABNORMAL LOW (ref 8.4–10.5)
Chloride: 104 mEq/L (ref 96–112)
Creatinine, Ser: 0.98 mg/dL (ref 0.50–1.35)
GFR calc Af Amer: 87 mL/min — ABNORMAL LOW (ref >90)
GFR calc non Af Amer: 75 mL/min — ABNORMAL LOW (ref >90)
GLUCOSE: 218 mg/dL — AB (ref 70–99)
POTASSIUM: 3.5 meq/L — AB (ref 3.7–5.3)
SODIUM: 141 meq/L (ref 137–147)
TOTAL PROTEIN: 6.8 g/dL (ref 6.0–8.3)
Total Bilirubin: 0.4 mg/dL (ref 0.3–1.2)

## 2013-08-30 LAB — URINALYSIS, ROUTINE W REFLEX MICROSCOPIC
Bilirubin Urine: NEGATIVE
GLUCOSE, UA: 250 mg/dL — AB
HGB URINE DIPSTICK: NEGATIVE
Ketones, ur: NEGATIVE mg/dL
LEUKOCYTES UA: NEGATIVE
Nitrite: NEGATIVE
Protein, ur: 100 mg/dL — AB
SPECIFIC GRAVITY, URINE: 1.016 (ref 1.005–1.030)
Urobilinogen, UA: 0.2 mg/dL (ref 0.0–1.0)
pH: 6.5 (ref 5.0–8.0)

## 2013-08-30 LAB — OCCULT BLOOD, POC DEVICE: FECAL OCCULT BLD: NEGATIVE

## 2013-08-30 LAB — POCT I-STAT TROPONIN I: TROPONIN I, POC: 0 ng/mL (ref 0.00–0.08)

## 2013-08-30 LAB — URINE MICROSCOPIC-ADD ON

## 2013-08-30 LAB — PROTIME-INR
INR: 0.99 (ref 0.00–1.49)
Prothrombin Time: 12.9 seconds (ref 11.6–15.2)

## 2013-08-30 MED ORDER — GABAPENTIN 300 MG PO CAPS
300.0000 mg | ORAL_CAPSULE | Freq: Every day | ORAL | Status: DC
  Administered 2013-08-30 – 2013-09-04 (×6): 300 mg via ORAL
  Filled 2013-08-30 (×7): qty 1

## 2013-08-30 MED ORDER — SODIUM CHLORIDE 0.9 % IV SOLN
INTRAVENOUS | Status: DC
  Administered 2013-08-30 – 2013-09-01 (×4): via INTRAVENOUS

## 2013-08-30 MED ORDER — AMLODIPINE BESYLATE 5 MG PO TABS
5.0000 mg | ORAL_TABLET | Freq: Every day | ORAL | Status: DC
  Administered 2013-08-30 – 2013-09-05 (×7): 5 mg via ORAL
  Filled 2013-08-30 (×7): qty 1

## 2013-08-30 MED ORDER — PANTOPRAZOLE SODIUM 40 MG IV SOLR
40.0000 mg | Freq: Every day | INTRAVENOUS | Status: DC
  Filled 2013-08-30 (×5): qty 40

## 2013-08-30 MED ORDER — DOCUSATE SODIUM 100 MG PO CAPS
100.0000 mg | ORAL_CAPSULE | Freq: Two times a day (BID) | ORAL | Status: DC
  Administered 2013-08-30 – 2013-09-05 (×9): 100 mg via ORAL
  Filled 2013-08-30 (×11): qty 1

## 2013-08-30 MED ORDER — DEXAMETHASONE 2 MG PO TABS
2.0000 mg | ORAL_TABLET | ORAL | Status: DC
  Filled 2013-08-30: qty 1

## 2013-08-30 MED ORDER — TRAMADOL HCL 50 MG PO TABS
50.0000 mg | ORAL_TABLET | Freq: Four times a day (QID) | ORAL | Status: DC | PRN
  Administered 2013-08-30 – 2013-08-31 (×2): 50 mg via ORAL
  Filled 2013-08-30 (×2): qty 1

## 2013-08-30 MED ORDER — PANTOPRAZOLE SODIUM 40 MG PO TBEC: 40.0000 mg | DELAYED_RELEASE_TABLET | Freq: Every day | ORAL | Status: DC

## 2013-08-30 MED ORDER — ONDANSETRON HCL 4 MG PO TABS: 4.0000 mg | ORAL_TABLET | Freq: Four times a day (QID) | ORAL | Status: DC | PRN

## 2013-08-30 MED ORDER — OXYCODONE HCL 5 MG PO TABS
5.0000 mg | ORAL_TABLET | ORAL | Status: DC | PRN
  Administered 2013-08-30 – 2013-09-01 (×6): 5 mg via ORAL
  Filled 2013-08-30 (×6): qty 1

## 2013-08-30 MED ORDER — DEXAMETHASONE 4 MG PO TABS: 4.0000 mg | ORAL_TABLET | ORAL | Status: DC

## 2013-08-30 MED ORDER — LENALIDOMIDE 5 MG PO CAPS
5.0000 mg | ORAL_CAPSULE | Freq: Every day | ORAL | Status: DC
  Administered 2013-08-31 – 2013-09-05 (×6): 5 mg via ORAL
  Filled 2013-08-30 (×7): qty 1

## 2013-08-30 MED ORDER — ONDANSETRON HCL 4 MG/2ML IJ SOLN: 4.0000 mg | Freq: Four times a day (QID) | INTRAMUSCULAR | Status: DC | PRN

## 2013-08-30 MED ORDER — FUROSEMIDE 20 MG PO TABS
20.0000 mg | ORAL_TABLET | Freq: Every day | ORAL | Status: DC
  Administered 2013-08-30 – 2013-09-05 (×7): 20 mg via ORAL
  Filled 2013-08-30 (×7): qty 1

## 2013-08-30 MED ORDER — IRBESARTAN 300 MG PO TABS
300.0000 mg | ORAL_TABLET | Freq: Every day | ORAL | Status: DC
  Administered 2013-08-30 – 2013-09-05 (×7): 300 mg via ORAL
  Filled 2013-08-30 (×7): qty 1

## 2013-08-30 MED ORDER — SIMVASTATIN 20 MG PO TABS
20.0000 mg | ORAL_TABLET | Freq: Every day | ORAL | Status: DC
  Administered 2013-08-30 – 2013-09-04 (×6): 20 mg via ORAL
  Filled 2013-08-30 (×7): qty 1

## 2013-08-30 MED ORDER — PANTOPRAZOLE SODIUM 40 MG PO TBEC
40.0000 mg | DELAYED_RELEASE_TABLET | Freq: Every day | ORAL | Status: DC
  Administered 2013-08-30 – 2013-09-05 (×7): 40 mg via ORAL
  Filled 2013-08-30 (×7): qty 1

## 2013-08-30 MED ORDER — NITROGLYCERIN 0.4 MG SL SUBL: 0.4000 mg | SUBLINGUAL_TABLET | SUBLINGUAL | Status: DC | PRN

## 2013-08-30 MED ORDER — BISACODYL 10 MG RE SUPP
10.0000 mg | Freq: Every day | RECTAL | Status: DC | PRN
  Administered 2013-09-02: 10 mg via RECTAL
  Filled 2013-08-30: qty 1

## 2013-08-30 MED ORDER — METOPROLOL SUCCINATE ER 50 MG PO TB24
50.0000 mg | ORAL_TABLET | Freq: Every day | ORAL | Status: DC
  Administered 2013-08-30 – 2013-09-05 (×7): 50 mg via ORAL
  Filled 2013-08-30 (×7): qty 1

## 2013-08-30 MED ORDER — HYDROMORPHONE HCL PF 1 MG/ML IJ SOLN
0.5000 mg | INTRAMUSCULAR | Status: DC | PRN
  Administered 2013-08-30 – 2013-08-31 (×4): 0.5 mg via INTRAVENOUS
  Administered 2013-08-31: 1 mg via INTRAVENOUS
  Filled 2013-08-30 (×6): qty 1

## 2013-08-30 NOTE — ED Notes (Signed)
Patient to CT.

## 2013-08-30 NOTE — ED Notes (Signed)
Patient was found face down on both arms on sidewalk out side his home. Patient's wife states he went out to walk the dog and a neighbor found him. EMS placed PIV, collar and LSB...  Patient unable to move arms but has good sensation. Patient alert and oriented.

## 2013-08-30 NOTE — ED Notes (Signed)
Pt to MRI then [facility]

## 2013-08-30 NOTE — ED Notes (Signed)
MRI stated at this time that it will approx 1 hour before they can do his MRI.

## 2013-08-30 NOTE — H&P (Signed)
History   John Carter is an 80 y.o. male.   Chief Complaint:  Chief Complaint  Patient presents with  . Fall    Fall This is a new problem. The current episode started today. The problem occurs intermittently. The problem has been unchanged. Associated symptoms include neck pain and weakness. Pertinent negatives include no abdominal pain, chest pain, coughing, headaches, myalgias, nausea or vomiting. Nothing aggravates the symptoms. He has tried nothing for the symptoms. The treatment provided no relief.    Past Medical History  Diagnosis Date  . Angina   . Stroke   . Multiple myeloma 06/2009    was on chemo  . Melanoma   . Hypertension     amlodipine and metoprolol  . Hyperlipidemia     takes Zocor daily  . Coronary artery disease     2 stents placed in 2001  . Myocardial infarction 2001  . Sleep apnea     sleep study done 35yrs ago and pt does use a CPAP;setting of 10  . TIA (transient ischemic attack) 2007  . Arthritis     left knee  . Chronic back pain     compression fracture,degenerative disc disease  . Bruises easily     pt is on Plavix and Aspirin  . GERD (gastroesophageal reflux disease)     takes Omeprazole daily  . Hiatal hernia   . Gastric ulcer   . Diverticulosis   . Hemorrhoids   . History of colonic polyps   . Urinary frequency   . Nocturia   . Leg cramps   . Cataracts, bilateral   . Hiatal hernia   . Other pancytopenia 04/16/2013  . Knee pain, acute 08/27/2013    Past Surgical History  Procedure Laterality Date  . Back surgery  80's  . Tonsillectomy      as a  child  . Colonoscopy    . Appendectomy      as a child  . Knee surgery  1997    left knee arthroscopy  . Knee arthroscopy  1999    right  . Coronary angioplasty  09/04/1999  . Total knee arthroplasty  06/28/2011    Procedure: TOTAL KNEE ARTHROPLASTY;  Surgeon: Lorn Junes, MD;  Location: [facility];  Service: Orthopedics;  Laterality: Left;  TOTAL KNEE ARTHROPLASTY LEFT SIDE      Family History  Problem Relation Age of Onset  . Anesthesia problems Neg Hx   . Hypotension Neg Hx   . Malignant hyperthermia Neg Hx   . Pseudochol deficiency Neg Hx   . Heart attack Mother   . Heart attack Father   . Diabetes Father   . Cancer Sister   . Heart attack Brother   . Diabetes Brother    Social History:  reports that he has never smoked. He has never used smokeless tobacco. He reports that he does not drink alcohol or use illicit drugs.  Allergies   Allergies  Allergen Reactions  . Sulfa Drugs Cross Reactors Rash    Home Medications   (Not in a hospital admission)  Trauma Course   Results for orders placed during the hospital encounter of 08/30/13 (from the past 48 hour(s))  OCCULT BLOOD, POC DEVICE     Status: None   Collection Time    08/30/13 10:04 AM      Result Value Ref Range   Fecal Occult Bld NEGATIVE  NEGATIVE  CBC WITH DIFFERENTIAL     Status: Abnormal  Collection Time    08/30/13 10:25 AM      Result Value Ref Range   WBC 9.4  4.0 - 10.5 K/uL   RBC 3.31 (*) 4.22 - 5.81 MIL/uL   Hemoglobin 10.7 (*) 13.0 - 17.0 g/dL   HCT 31.8 (*) 39.0 - 52.0 %   MCV 96.1  78.0 - 100.0 fL   MCH 32.3  26.0 - 34.0 pg   MCHC 33.6  30.0 - 36.0 g/dL   RDW 14.4  11.5 - 15.5 %   Platelets 57 (*) 150 - 400 K/uL   Comment: PLATELET COUNT CONFIRMED BY SMEAR   Neutrophils Relative % 52  43 - 77 %   Neutro Abs 4.9  1.7 - 7.7 K/uL   Lymphocytes Relative 16  12 - 46 %   Lymphs Abs 1.5  0.7 - 4.0 K/uL   Monocytes Relative 29 (*) 3 - 12 %   Monocytes Absolute 2.7 (*) 0.1 - 1.0 K/uL   Eosinophils Relative 2  0 - 5 %   Eosinophils Absolute 0.2  0.0 - 0.7 K/uL   Basophils Relative 0  0 - 1 %   Basophils Absolute 0.0  0.0 - 0.1 K/uL  PROTIME-INR     Status: None   Collection Time    08/30/13 10:25 AM      Result Value Ref Range   Prothrombin Time 12.9  11.6 - 15.2 seconds   INR 0.99  0.00 - 1.49  POCT I-STAT TROPONIN I     Status: None   Collection Time     08/30/13 10:32 AM      Result Value Ref Range   Troponin i, poc 0.00  0.00 - 0.08 ng/mL   Comment 3            Comment: Due to the release kinetics of cTnI,     a negative result within the first hours     of the onset of symptoms does not rule out     myocardial infarction with certainty.     If myocardial infarction is still suspected,     repeat the test at appropriate intervals.   Ct Head Wo Contrast  08/30/2013   CLINICAL DATA:  Fall onto face.  Unable to move arms.  EXAM: CT HEAD WITHOUT CONTRAST  CT CERVICAL SPINE WITHOUT CONTRAST  TECHNIQUE: Multidetector CT imaging of the head and cervical spine was performed following the standard protocol without intravenous contrast. Multiplanar CT image reconstructions of the cervical spine were also generated.  COMPARISON:  DG BONE SURVEY MET dated 12/30/2011; MR HEAD W/O CM dated 09/03/2005; CT HEAD W/O CM dated 08/26/2005  FINDINGS: CT HEAD FINDINGS  The brainstem, cerebellum, cerebral peduncles, thalamus, basal ganglia, basilar cisterns, and ventricular system appear within normal limits. Periventricular white matter and corona radiata hypodensities favor chronic ischemic microvascular white matter disease. No intracranial hemorrhage, mass lesion, or acute CVA.  Right frontal forehead scalp soft tissue swelling. Mild chronic left maxillary sinusitis. Diffuse chronic ethmoid sinusitis with mucosal thickening in the nasal cavity.  Irregularity of the head bilateral nasal bones, increased from 08/26/2005, and suspicious for nasal bone fractures. There is dense calcification along the transverse ligament at C1-2. There is atherosclerotic calcification of the cavernous carotid arteries bilaterally.  CT CERVICAL SPINE FINDINGS  The cervical spondylosis with interbody bridging spurring extending at all levels between C4 and T2. Uncinate and facet spurring causes osseous foraminal stenosis at a variety of levels but most strikingly at C2-3 and  C3-4 bilaterally. Bony  fusion across the facet joints is present on the left at C4-C5-C6 and on the right at C5-6. There is posterior osseous ridging at C3-4, C5-6, and C6-7. No fracture or acute subluxation observed. There is atherosclerotic calcification of the carotid bulbs.  Left thyroid lesion, 1.5 x 0.7 cm, hypodense, without internal calcifications.  IMPRESSION: 1. Chronic ethmoid and left maxillary sinusitis with mucosal thickening in the nasal cavity. 2. Suspected bilateral nasal fractures, relatively nondisplaced. 3. Atherosclerosis. 4. No acute intracranial findings. 5. Periventricular white matter and corona radiata hypodensities favor chronic ischemic microvascular white matter disease. 6. Extensive cervical spondylosis with multilevel bridging intervertebral and facet spurring, and spurring causing multilevel foraminal impingement. No acute cervical spine fracture or acute subluxation observed.   Electronically Signed   By: Sherryl Barters M.D.   On: 08/30/2013 10:59   Ct Cervical Spine Wo Contrast  08/30/2013   CLINICAL DATA:  Fall onto face.  Unable to move arms.  EXAM: CT HEAD WITHOUT CONTRAST  CT CERVICAL SPINE WITHOUT CONTRAST  TECHNIQUE: Multidetector CT imaging of the head and cervical spine was performed following the standard protocol without intravenous contrast. Multiplanar CT image reconstructions of the cervical spine were also generated.  COMPARISON:  DG BONE SURVEY MET dated 12/30/2011; MR HEAD W/O CM dated 09/03/2005; CT HEAD W/O CM dated 08/26/2005  FINDINGS: CT HEAD FINDINGS  The brainstem, cerebellum, cerebral peduncles, thalamus, basal ganglia, basilar cisterns, and ventricular system appear within normal limits. Periventricular white matter and corona radiata hypodensities favor chronic ischemic microvascular white matter disease. No intracranial hemorrhage, mass lesion, or acute CVA.  Right frontal forehead scalp soft tissue swelling. Mild chronic left maxillary sinusitis. Diffuse chronic ethmoid  sinusitis with mucosal thickening in the nasal cavity.  Irregularity of the head bilateral nasal bones, increased from 08/26/2005, and suspicious for nasal bone fractures. There is dense calcification along the transverse ligament at C1-2. There is atherosclerotic calcification of the cavernous carotid arteries bilaterally.  CT CERVICAL SPINE FINDINGS  The cervical spondylosis with interbody bridging spurring extending at all levels between C4 and T2. Uncinate and facet spurring causes osseous foraminal stenosis at a variety of levels but most strikingly at C2-3 and C3-4 bilaterally. Bony fusion across the facet joints is present on the left at C4-C5-C6 and on the right at C5-6. There is posterior osseous ridging at C3-4, C5-6, and C6-7. No fracture or acute subluxation observed. There is atherosclerotic calcification of the carotid bulbs.  Left thyroid lesion, 1.5 x 0.7 cm, hypodense, without internal calcifications.  IMPRESSION: 1. Chronic ethmoid and left maxillary sinusitis with mucosal thickening in the nasal cavity. 2. Suspected bilateral nasal fractures, relatively nondisplaced. 3. Atherosclerosis. 4. No acute intracranial findings. 5. Periventricular white matter and corona radiata hypodensities favor chronic ischemic microvascular white matter disease. 6. Extensive cervical spondylosis with multilevel bridging intervertebral and facet spurring, and spurring causing multilevel foraminal impingement. No acute cervical spine fracture or acute subluxation observed.   Electronically Signed   By: Sherryl Barters M.D.   On: 08/30/2013 10:59   Dg Chest Port 1 View  08/30/2013   CLINICAL DATA:  Status post fall.  Facial lacerations.  EXAM: PORTABLE CHEST - 1 VIEW  COMPARISON:  PA and lateral chest 06/22/2011.  FINDINGS: Lungs are clear. Heart size is normal. No pneumothorax or pleural effusion. No focal bony abnormality. Degenerative change about the shoulders noted.  IMPRESSION: No acute disease.    Electronically Signed   By: Inge Rise M.D.  On: 08/30/2013 10:49   Dg Knee Right Port  08/30/2013   CLINICAL DATA:  Fall.  Abrasions along the knee.  EXAM: PORTABLE RIGHT KNEE - 1-2 VIEW  COMPARISON:  None.  FINDINGS: Severe osteoarthritis. Distal SFA and popliteal artery atherosclerosis. Trace knee effusion. The severe loss of medial compartmental articular space.  No discrete fracture observed.  IMPRESSION: 1. No discrete fracture is observed on this two view series. 2. Severe osteoarthritis. The severity of spurring causes cortical irregularity which can reduce sensitivity for subtle fractures. 3. Trace knee effusion. 4. Atherosclerosis.   Electronically Signed   By: Sherryl Barters M.D.   On: 08/30/2013 10:38    Review of Systems  Constitutional: Negative for weight loss.  HENT: Negative for ear discharge, ear pain, hearing loss and tinnitus.   Eyes: Negative for blurred vision, double vision, photophobia and pain.  Respiratory: Negative for cough, sputum production and shortness of breath.   Cardiovascular: Negative for chest pain.  Gastrointestinal: Negative for nausea, vomiting and abdominal pain.  Genitourinary: Negative for dysuria, urgency, frequency and flank pain.  Musculoskeletal: Positive for neck pain. Negative for back pain, falls, joint pain and myalgias.  Neurological: Positive for weakness. Negative for dizziness, tingling, sensory change, focal weakness, loss of consciousness and headaches.  Endo/Heme/Allergies: Does not bruise/bleed easily.  Psychiatric/Behavioral: Negative for depression, memory loss and substance abuse. The patient is not nervous/anxious.     Blood pressure 148/68, pulse 82, temperature 98.5 F (36.9 C), temperature source Oral, resp. rate 17, height $RemoveBe'5\' 8"'[id number]$  (1.727 m), weight 87.091 kg (192 lb), SpO2 95.00%. Physical Exam  Constitutional: He is oriented to person, place, and time. He appears well-developed and well-nourished.  HENT:  Head: Head  is with abrasion and with contusion.    Eyes: Conjunctivae and EOM are normal. Pupils are equal, round, and reactive to light.  Neck: Normal range of motion. Neck supple.  Cardiovascular: Normal rate, regular rhythm and normal heart sounds.   Respiratory: Effort normal and breath sounds normal.  GI: Soft.  Musculoskeletal: Normal range of motion.  Neurological: He is alert and oriented to person, place, and time. He exhibits abnormal muscle tone. GCS eye subscore is 4. GCS verbal subscore is 5. GCS motor subscore is 6.  Reflex Scores:      Tricep reflexes are 0 on the right side and 0 on the left side.      Bicep reflexes are 0 on the right side and 0 on the left side.    Assessment/Plan Central cord syndrome with ground level fall Ankylosing spondylitis Facial abrasion  Admit to trauma service to 11M ICU Assessment by neurosurgery Patient on steroids for multiple myeloma   The MRI demonstrates a cord injury at the level of I believe to be C3 to C4 with some ligamentous damage.  The official reading is not back from radiology yet.  He is admitted to trauma with medical consultation and neurosurgery consultation.  Gwenyth Ober 08/30/2013, 11:32 AM   Procedures

## 2013-08-30 NOTE — Consult Note (Signed)
History   John Carter is an 80 y.o. male.  Chief Complaint:  Chief Complaint   Patient presents with   .  Fall    Fall  This is a new problem. The current episode started today. The problem occurs intermittently. The problem has been unchanged. Associated symptoms include neck pain and weakness. Pertinent negatives include no abdominal pain, chest pain, coughing, headaches, myalgias, nausea or vomiting. Nothing aggravates the symptoms. He has tried nothing for the symptoms. The treatment provided no relief. The patient fell while walking his dog.  He hit his face and forehead.  Past Medical History   Diagnosis  Date   .  Angina    .  Stroke    .  Multiple myeloma  06/2009     was on chemo   .  Melanoma    .  Hypertension      amlodipine and metoprolol   .  Hyperlipidemia      takes Zocor daily   .  Coronary artery disease      2 stents placed in 2001   .  Myocardial infarction  2001   .  Sleep apnea      sleep study done 10yr ago and pt does use a CPAP;setting of 10   .  TIA (transient ischemic attack)  2007   .  Arthritis      left knee   .  Chronic back pain      compression fracture,degenerative disc disease   .  Bruises easily      pt is on Plavix and Aspirin   .  GERD (gastroesophageal reflux disease)      takes Omeprazole daily   .  Hiatal hernia    .  Gastric ulcer    .  Diverticulosis    .  Hemorrhoids    .  History of colonic polyps    .  Urinary frequency    .  Nocturia    .  Leg cramps    .  Cataracts, bilateral    .  Hiatal hernia    .  Other pancytopenia  04/16/2013   .  Knee pain, acute  08/27/2013    Past Surgical History   Procedure  Laterality  Date   .  Back surgery   80's   .  Tonsillectomy       as a child   .  Colonoscopy     .  Appendectomy       as a child   .  Knee surgery   1997     left knee arthroscopy   .  Knee arthroscopy   1999     right   .  Coronary angioplasty   09/04/1999   .  Total knee arthroplasty   06/28/2011    Procedure: TOTAL KNEE ARTHROPLASTY; Surgeon: RLorn Junes MD; [facility] Service: Orthopedics; Laterality: Left; TOTAL KNEE ARTHROPLASTY LEFT SIDE    Family History   Problem  Relation  Age of Onset   .  Anesthesia problems  Neg Hx    .  Hypotension  Neg Hx    .  Malignant hyperthermia  Neg Hx    .  Pseudochol deficiency  Neg Hx    .  Heart attack  Mother    .  Heart attack  Father    .  Diabetes  Father    .  Cancer  Sister    .  Heart attack  Brother    .  Diabetes  Brother     Social History: reports that he has never smoked. He has never used smokeless tobacco. He reports that he does not drink alcohol or use illicit drugs.  Allergies    Allergies   Allergen  Reactions   .  Sulfa Drugs Cross Reactors  Rash    Home Medications    (Not in a hospital admission)  Trauma Course    Results for orders placed during the hospital encounter of 08/30/13 (from the past 48 hour(s))   OCCULT BLOOD, POC DEVICE Status: None    Collection Time    08/30/13 10:04 AM   Result  Value  Ref Range    Fecal Occult Bld  NEGATIVE  NEGATIVE   CBC WITH DIFFERENTIAL Status: Abnormal    Collection Time    08/30/13 10:25 AM   Result  Value  Ref Range    WBC  9.4  4.0 - 10.5 K/uL    RBC  3.31 (*)  4.22 - 5.81 MIL/uL    Hemoglobin  10.7 (*)  13.0 - 17.0 g/dL    HCT  31.8 (*)  39.0 - 52.0 %    MCV  96.1  78.0 - 100.0 fL    MCH  32.3  26.0 - 34.0 pg    MCHC  33.6  30.0 - 36.0 g/dL    RDW  14.4  11.5 - 15.5 %    Platelets  57 (*)  150 - 400 K/uL    Comment:  PLATELET COUNT CONFIRMED BY SMEAR    Neutrophils Relative %  52  43 - 77 %    Neutro Abs  4.9  1.7 - 7.7 K/uL    Lymphocytes Relative  16  12 - 46 %    Lymphs Abs  1.5  0.7 - 4.0 K/uL    Monocytes Relative  29 (*)  3 - 12 %    Monocytes Absolute  2.7 (*)  0.1 - 1.0 K/uL    Eosinophils Relative  2  0 - 5 %    Eosinophils Absolute  0.2  0.0 - 0.7 K/uL    Basophils Relative  0  0 - 1 %    Basophils Absolute  0.0  0.0 - 0.1 K/uL     PROTIME-INR Status: None    Collection Time    08/30/13 10:25 AM   Result  Value  Ref Range    Prothrombin Time  12.9  11.6 - 15.2 seconds    INR  0.99  0.00 - 1.49   POCT I-STAT TROPONIN I Status: None    Collection Time    08/30/13 10:32 AM   Result  Value  Ref Range    Troponin i, poc  0.00  0.00 - 0.08 ng/mL    Comment 3      Comment:  Due to the release kinetics of cTnI,     a negative result within the first hours     of the onset of symptoms does not rule out     myocardial infarction with certainty.     If myocardial infarction is still suspected,     repeat the test at appropriate intervals.    Ct Head Wo Contrast  08/30/2013 CLINICAL DATA: Fall onto face. Unable to move arms. EXAM: CT HEAD WITHOUT CONTRAST CT CERVICAL SPINE WITHOUT CONTRAST TECHNIQUE: Multidetector CT imaging of the head and cervical spine was performed following the standard protocol without intravenous contrast. Multiplanar  CT image reconstructions of the cervical spine were also generated. COMPARISON: DG BONE SURVEY MET dated 12/30/2011; MR HEAD W/O CM dated 09/03/2005; CT HEAD W/O CM dated 08/26/2005 FINDINGS: CT HEAD FINDINGS The brainstem, cerebellum, cerebral peduncles, thalamus, basal ganglia, basilar cisterns, and ventricular system appear within normal limits. Periventricular white matter and corona radiata hypodensities favor chronic ischemic microvascular white matter disease. No intracranial hemorrhage, mass lesion, or acute CVA. Right frontal forehead scalp soft tissue swelling. Mild chronic left maxillary sinusitis. Diffuse chronic ethmoid sinusitis with mucosal thickening in the nasal cavity. Irregularity of the head bilateral nasal bones, increased from 08/26/2005, and suspicious for nasal bone fractures. There is dense calcification along the transverse ligament at C1-2. There is atherosclerotic calcification of the cavernous carotid arteries bilaterally. CT CERVICAL SPINE FINDINGS The cervical  spondylosis with interbody bridging spurring extending at all levels between C4 and T2. Uncinate and facet spurring causes osseous foraminal stenosis at a variety of levels but most strikingly at C2-3 and C3-4 bilaterally. Bony fusion across the facet joints is present on the left at C4-C5-C6 and on the right at C5-6. There is posterior osseous ridging at C3-4, C5-6, and C6-7. No fracture or acute subluxation observed. There is atherosclerotic calcification of the carotid bulbs. Left thyroid lesion, 1.5 x 0.7 cm, hypodense, without internal calcifications. IMPRESSION: 1. Chronic ethmoid and left maxillary sinusitis with mucosal thickening in the nasal cavity. 2. Suspected bilateral nasal fractures, relatively nondisplaced. 3. Atherosclerosis. 4. No acute intracranial findings. 5. Periventricular white matter and corona radiata hypodensities favor chronic ischemic microvascular white matter disease. 6. Extensive cervical spondylosis with multilevel bridging intervertebral and facet spurring, and spurring causing multilevel foraminal impingement. No acute cervical spine fracture or acute subluxation observed. Electronically Signed By: Sherryl Barters M.D. On: 08/30/2013 10:59  Ct Cervical Spine Wo Contrast  08/30/2013 CLINICAL DATA: Fall onto face. Unable to move arms. EXAM: CT HEAD WITHOUT CONTRAST CT CERVICAL SPINE WITHOUT CONTRAST TECHNIQUE: Multidetector CT imaging of the head and cervical spine was performed following the standard protocol without intravenous contrast. Multiplanar CT image reconstructions of the cervical spine were also generated. COMPARISON: DG BONE SURVEY MET dated 12/30/2011; MR HEAD W/O CM dated 09/03/2005; CT HEAD W/O CM dated 08/26/2005 FINDINGS: CT HEAD FINDINGS The brainstem, cerebellum, cerebral peduncles, thalamus, basal ganglia, basilar cisterns, and ventricular system appear within normal limits. Periventricular white matter and corona radiata hypodensities favor chronic ischemic  microvascular white matter disease. No intracranial hemorrhage, mass lesion, or acute CVA. Right frontal forehead scalp soft tissue swelling. Mild chronic left maxillary sinusitis. Diffuse chronic ethmoid sinusitis with mucosal thickening in the nasal cavity. Irregularity of the head bilateral nasal bones, increased from 08/26/2005, and suspicious for nasal bone fractures. There is dense calcification along the transverse ligament at C1-2. There is atherosclerotic calcification of the cavernous carotid arteries bilaterally. CT CERVICAL SPINE FINDINGS The cervical spondylosis with interbody bridging spurring extending at all levels between C4 and T2. Uncinate and facet spurring causes osseous foraminal stenosis at a variety of levels but most strikingly at C2-3 and C3-4 bilaterally. Bony fusion across the facet joints is present on the left at C4-C5-C6 and on the right at C5-6. There is posterior osseous ridging at C3-4, C5-6, and C6-7. No fracture or acute subluxation observed. There is atherosclerotic calcification of the carotid bulbs. Left thyroid lesion, 1.5 x 0.7 cm, hypodense, without internal calcifications. IMPRESSION: 1. Chronic ethmoid and left maxillary sinusitis with mucosal thickening in the nasal cavity. 2. Suspected bilateral nasal fractures, relatively nondisplaced.  3. Atherosclerosis. 4. No acute intracranial findings. 5. Periventricular white matter and corona radiata hypodensities favor chronic ischemic microvascular white matter disease. 6. Extensive cervical spondylosis with multilevel bridging intervertebral and facet spurring, and spurring causing multilevel foraminal impingement. No acute cervical spine fracture or acute subluxation observed. Electronically Signed By: Sherryl Barters M.D. On: 08/30/2013 10:59  Dg Chest Port 1 View  08/30/2013 CLINICAL DATA: Status post fall. Facial lacerations. EXAM: PORTABLE CHEST - 1 VIEW COMPARISON: PA and lateral chest 06/22/2011. FINDINGS: Lungs are  clear. Heart size is normal. No pneumothorax or pleural effusion. No focal bony abnormality. Degenerative change about the shoulders noted. IMPRESSION: No acute disease. Electronically Signed By: Inge Rise M.D. On: 08/30/2013 10:49  Dg Knee Right Port  08/30/2013 CLINICAL DATA: Fall. Abrasions along the knee. EXAM: PORTABLE RIGHT KNEE - 1-2 VIEW COMPARISON: None. FINDINGS: Severe osteoarthritis. Distal SFA and popliteal artery atherosclerosis. Trace knee effusion. The severe loss of medial compartmental articular space. No discrete fracture observed. IMPRESSION: 1. No discrete fracture is observed on this two view series. 2. Severe osteoarthritis. The severity of spurring causes cortical irregularity which can reduce sensitivity for subtle fractures. 3. Trace knee effusion. 4. Atherosclerosis. Electronically Signed By: Sherryl Barters M.D. On: 08/30/2013 10:38   Review of Systems  Constitutional: Negative for weight loss.  HENT: Negative for ear discharge, ear pain, hearing loss and tinnitus.  Eyes: Negative for blurred vision, double vision, photophobia and pain.  Respiratory: Negative for cough, sputum production and shortness of breath.  Cardiovascular: Negative for chest pain.  Gastrointestinal: Negative for nausea, vomiting and abdominal pain.  Genitourinary: Negative for dysuria, urgency, frequency and flank pain.  Musculoskeletal: Positive for neck pain. Negative for back pain, falls, joint pain and myalgias.  Neurological: Positive for weakness. Negative for dizziness, tingling, sensory change, focal weakness, loss of consciousness and headaches.  Endo/Heme/Allergies: Does not bruise/bleed easily.  Psychiatric/Behavioral: Negative for depression, memory loss and substance abuse. The patient is not nervous/anxious.   Blood pressure 148/68, pulse 82, temperature 98.5 F (36.9 C), temperature source Oral, resp. rate 17, height _0  (1.727 m), weight 87.091 kg (192 lb), SpO2 95.00%.   Physical Exam  Constitutional: He is oriented to person, place, and time. He appears well-developed and well-nourished.  HENT:  Head: Head is with abrasion and with contusion.    Eyes: Conjunctivae and EOM are normal. Pupils are equal, round, and reactive to light.  Neck: Normal range of motion. Neck supple.  Cardiovascular: Normal rate, regular rhythm and normal heart sounds.  Respiratory: Effort normal and breath sounds normal.  GI: Soft.  Musculoskeletal: Normal range of motion.  Neurological: He is alert and oriented to person, place, and time. He exhibits abnormal muscle tone. GCS eye subscore is 4. GCS verbal subscore is 5. GCS motor subscore is 6. The patient is able to open his eyes, pupils are reactive, EOMI.  Face has bruising, but symmetric movement.  He is unable to shrug his shoulders.  He has no movement of either upper extremity.  He is able to move his legs, but weakly.  He does not have antigravity strength in either leg, although he is able to kick his feet. About the bed.  He complains of numbness in both arms and legs, although he is able to identify light touch on all extremities. Reflex Scores:  Tricep reflexes are 0 on the right side and 0 on the left side.  Bicep reflexes are 0 on the right side and 0 on the left side.  Areflexic in the knees and ankles.  Assessment/Plan  Central cord syndrome with ground level fall  Ankylosing spondylitis  Facial abrasion  Admit to trauma service to [facility] ICU  Per my assessment, patient has a significant incomplete spinal cord injury centered at C 34 due to a fall with ankylosing spondylitis and cervical arthritis with stenosis at the C 34 level.  He essentially has a bamboo spine with the only motion segments at C 23 and C 34 levels.  He has cervical stenosis to a severe degree at C 34 and not a severe degree at C 23.  He will require decompression and stabilization posteriorly after he is medically stabilized and we will give him an  opportunity to improve neurologically. Patient on steroids for multiple myeloma  The MRI demonstrates a cord injury at the level of I believe to be C3 to C4 with some ligamentous damage. The official reading is not back from radiology yet. He is admitted to trauma with medical consultation and neurosurgery consultation.

## 2013-08-30 NOTE — ED Provider Notes (Signed)
CSN: [id number]     Arrival date & time 08/30/13  8539 History   First MD Initiated Contact with Patient 08/30/13 [phone]     Chief Complaint  Patient presents with  . Fall     (Consider location/radiation/quality/duration/timing/severity/associated sxs/prior Treatment) HPI Comments: 80 yo wm with MMP presents to ER via EMS for fall.  Pt was found down outside his home by driver by.  EMS was called. Pt has noted contusion to forehead and is c/o bilat UE weakness/paralysis.    Pt is amnestic to event.    Patient is a 79 y.o. male presenting with fall. The history is provided by the patient and the EMS personnel.  Fall This is a new problem. The current episode started 1 to 2 hours ago. The problem occurs constantly. The problem has not changed since onset.Associated symptoms include headaches. Associated symptoms comments: bilat UE weakness. Nothing aggravates the symptoms. Nothing relieves the symptoms. He has tried nothing for the symptoms.    Past Medical History  Diagnosis Date  . Angina   . Stroke   . Multiple myeloma 06/2009    was on chemo  . Melanoma   . Hypertension     amlodipine and metoprolol  . Hyperlipidemia     takes Zocor daily  . Coronary artery disease     2 stents placed in 2001  . Myocardial infarction 2001  . Sleep apnea     sleep study done 48yrs ago and pt does use a CPAP;setting of 10  . TIA (transient ischemic attack) 2007  . Arthritis     left knee  . Chronic back pain     compression fracture,degenerative disc disease  . Bruises easily     pt is on Plavix and Aspirin  . GERD (gastroesophageal reflux disease)     takes Omeprazole daily  . Hiatal hernia   . Gastric ulcer   . Diverticulosis   . Hemorrhoids   . History of colonic polyps   . Urinary frequency   . Nocturia   . Leg cramps   . Cataracts, bilateral   . Hiatal hernia   . Other pancytopenia 04/16/2013  . Knee pain, acute 08/27/2013   Past Surgical History  Procedure Laterality Date   . Back surgery  80's  . Tonsillectomy      as a  child  . Colonoscopy    . Appendectomy      as a child  . Knee surgery  1997    left knee arthroscopy  . Knee arthroscopy  1999    right  . Coronary angioplasty  09/04/1999  . Total knee arthroplasty  06/28/2011    Procedure: TOTAL KNEE ARTHROPLASTY;  Surgeon: Nilda Simmer, MD;  Location: [facility] OR;  Service: Orthopedics;  Laterality: Left;  TOTAL KNEE ARTHROPLASTY LEFT SIDE   Family History  Problem Relation Age of Onset  . Anesthesia problems Neg Hx   . Hypotension Neg Hx   . Malignant hyperthermia Neg Hx   . Pseudochol deficiency Neg Hx   . Heart attack Mother   . Heart attack Father   . Diabetes Father   . Cancer Sister   . Heart attack Brother   . Diabetes Brother    History  Substance Use Topics  . Smoking status: Never Smoker   . Smokeless tobacco: Never Used  . Alcohol Use: No    Review of Systems  HENT: Positive for facial swelling.        Trauma to  face and forehead  Eyes: Negative.   Respiratory: Negative.   Cardiovascular: Negative.   Gastrointestinal: Negative.   Genitourinary: Negative.   Musculoskeletal: Positive for arthralgias.       UE weakness  Skin: Negative.   Neurological: Positive for weakness, numbness and headaches.       Bilat UE weakness      Allergies  Sulfa drugs cross reactors  Home Medications   Current Outpatient Rx  Name  Route  Sig  Dispense  Refill  . lenalidomide (REVLIMID) 5 MG capsule   Oral   Take 1 capsule (5 mg total) by mouth daily.   21 capsule   0     ADULT MALE/ AUTHORIZATION [phone] PT.WILL TAKE H ...   . amLODipine (NORVASC) 5 MG tablet   Oral   Take 5 mg by mouth daily.           Marland Kitchen aspirin 81 MG tablet   Oral   Take 81 mg by mouth daily.         . Calcium-Vitamin D-Vitamin K (VIACTIV PO)   Oral   Take by mouth daily.         . cetirizine-pseudoephedrine (ZYRTEC-D) 5-120 MG per tablet   Oral   Take 1 tablet by mouth 2 (two) times  daily.          . clopidogrel (PLAVIX) 75 MG tablet   Oral   Take 75 mg by mouth daily.          Marland Kitchen dexamethasone (DECADRON) 4 MG tablet   Oral   Take 4 mg by mouth every 7 (seven) days.          . flunisolide (NASAREL) 29 MCG/ACT (0.025%) nasal spray   Nasal   Place 2 sprays into the nose at bedtime. Dose is for each nostril.          . furosemide (LASIX) 20 MG tablet   Oral   Take 20 mg by mouth daily.         Marland Kitchen gabapentin (NEURONTIN) 300 MG capsule   Oral   Take 300 mg by mouth at bedtime.         . metoprolol (TOPROL-XL) 50 MG 24 hr tablet   Oral   Take 50 mg by mouth daily.           . Multiple Vitamins-Minerals (MULTIVITAMINS THER. W/MINERALS) TABS   Oral   Take 1 tablet by mouth daily. Centrum silver         . nitroGLYCERIN (NITROSTAT) 0.4 MG SL tablet   Sublingual   Place 0.4 mg under the tongue every 5 (five) minutes as needed. For chest pain          . omeprazole (PRILOSEC) 20 MG capsule   Oral   Take 20 mg by mouth daily.           . simvastatin (ZOCOR) 20 MG tablet   Oral   Take 20 mg by mouth at bedtime.           . traMADol (ULTRAM) 50 MG tablet   Oral   Take 1 tablet (50 mg total) by mouth every 6 (six) hours as needed.   60 tablet   0   . valsartan (DIOVAN) 320 MG tablet   Oral   Take 320 mg by mouth daily.          . Vitamin D, Ergocalciferol, (DRISDOL) 50000 UNITS CAPS   Oral   Take 50,000 Units by mouth  every 14 (fourteen) days.           BP 148/68  Pulse 82  Temp(Src) 98.5 F (36.9 C) (Oral)  Resp 17  Ht $R'5\' 8"'My$  (1.727 m)  Wt 192 lb (87.091 kg)  BMI 29.20 kg/m2  SpO2 95% Physical Exam  Nursing note and vitals reviewed. Constitutional: He is oriented to person, place, and time. He appears well-developed and well-nourished.  HENT:  Right Ear: External ear normal.  Left Ear: External ear normal.  Mouth/Throat: Oropharynx is clear and moist.  Contusion to forehead, small lacs to face, no active bleeding   Eyes: Conjunctivae and EOM are normal. Pupils are equal, round, and reactive to light.  Neck: No tracheal deviation present. No thyromegaly present.  In c-collar  Cardiovascular: Normal rate and regular rhythm.   Pulmonary/Chest: Effort normal and breath sounds normal. He has no wheezes. He has no rales.  Abdominal: Soft. Bowel sounds are normal. He exhibits no distension and no mass. There is no tenderness. There is no rebound and no guarding.  Musculoskeletal: He exhibits no edema and no tenderness.  Unable to move bilat UE  Neurological: He is alert and oriented to person, place, and time. A sensory deficit is present. No cranial nerve deficit. He exhibits abnormal muscle tone. GCS eye subscore is 4. GCS verbal subscore is 5. GCS motor subscore is 6.  Follows commands.  Pt is unable to move bilat UE.    Skin: Skin is warm and dry.    ED Course  Procedures (including critical care time) Labs Review Labs Reviewed  CBC WITH DIFFERENTIAL - Abnormal; Notable for the following:    RBC 3.31 (*)    Hemoglobin 10.7 (*)    HCT 31.8 (*)    Platelets 57 (*)    Monocytes Relative 29 (*)    Monocytes Absolute 2.7 (*)    All other components within normal limits  PROTIME-INR  COMPREHENSIVE METABOLIC PANEL  URINALYSIS, ROUTINE W REFLEX MICROSCOPIC  OCCULT BLOOD, POC DEVICE  POCT I-STAT TROPONIN I   Imaging Review Ct Cervical Spine Wo Contrast  08/30/2013   CLINICAL DATA:  Fall onto face.  Unable to move arms.  EXAM: CT HEAD WITHOUT CONTRAST  CT CERVICAL SPINE WITHOUT CONTRAST  TECHNIQUE: Multidetector CT imaging of the head and cervical spine was performed following the standard protocol without intravenous contrast. Multiplanar CT image reconstructions of the cervical spine were also generated.  COMPARISON:  DG BONE SURVEY MET dated 12/30/2011; MR HEAD W/O CM dated 09/03/2005; CT HEAD W/O CM dated 08/26/2005  FINDINGS: CT HEAD FINDINGS  The brainstem, cerebellum, cerebral peduncles, thalamus,  basal ganglia, basilar cisterns, and ventricular system appear within normal limits. Periventricular white matter and corona radiata hypodensities favor chronic ischemic microvascular white matter disease. No intracranial hemorrhage, mass lesion, or acute CVA.  Right frontal forehead scalp soft tissue swelling. Mild chronic left maxillary sinusitis. Diffuse chronic ethmoid sinusitis with mucosal thickening in the nasal cavity.  Irregularity of the head bilateral nasal bones, increased from 08/26/2005, and suspicious for nasal bone fractures. There is dense calcification along the transverse ligament at C1-2. There is atherosclerotic calcification of the cavernous carotid arteries bilaterally.  CT CERVICAL SPINE FINDINGS  The cervical spondylosis with interbody bridging spurring extending at all levels between C4 and T2. Uncinate and facet spurring causes osseous foraminal stenosis at a variety of levels but most strikingly at C2-3 and C3-4 bilaterally. Bony fusion across the facet joints is present on the left at C4-C5-C6 and  on the right at C5-6. There is posterior osseous ridging at C3-4, C5-6, and C6-7. No fracture or acute subluxation observed. There is atherosclerotic calcification of the carotid bulbs.  Left thyroid lesion, 1.5 x 0.7 cm, hypodense, without internal calcifications.  IMPRESSION: 1. Chronic ethmoid and left maxillary sinusitis with mucosal thickening in the nasal cavity. 2. Suspected bilateral nasal fractures, relatively nondisplaced. 3. Atherosclerosis. 4. No acute intracranial findings. 5. Periventricular white matter and corona radiata hypodensities favor chronic ischemic microvascular white matter disease. 6. Extensive cervical spondylosis with multilevel bridging intervertebral and facet spurring, and spurring causing multilevel foraminal impingement. No acute cervical spine fracture or acute subluxation observed.   Electronically Signed   By: Sherryl Barters M.D.   On: 08/30/2013 10:59    Dg Chest Port 1 View  08/30/2013   CLINICAL DATA:  Status post fall.  Facial lacerations.  EXAM: PORTABLE CHEST - 1 VIEW  COMPARISON:  PA and lateral chest 06/22/2011.  FINDINGS: Lungs are clear. Heart size is normal. No pneumothorax or pleural effusion. No focal bony abnormality. Degenerative change about the shoulders noted.  IMPRESSION: No acute disease.   Electronically Signed   By: Inge Rise M.D.   On: 08/30/2013 10:49   Dg Knee Right Port  08/30/2013   CLINICAL DATA:  Fall.  Abrasions along the knee.  EXAM: PORTABLE RIGHT KNEE - 1-2 VIEW  COMPARISON:  None.  FINDINGS: Severe osteoarthritis. Distal SFA and popliteal artery atherosclerosis. Trace knee effusion. The severe loss of medial compartmental articular space.  No discrete fracture observed.  IMPRESSION: 1. No discrete fracture is observed on this two view series. 2. Severe osteoarthritis. The severity of spurring causes cortical irregularity which can reduce sensitivity for subtle fractures. 3. Trace knee effusion. 4. Atherosclerosis.   Electronically Signed   By: Sherryl Barters M.D.   On: 08/30/2013 10:38    EKG Interpretation   None       Date: 08/30/2013  Rate: 85  Rhythm: normal sinus rhythm  QRS Axis: normal  Intervals: QT prolonged  ST/T Wave abnormalities: normal  Conduction Disutrbances:none  Narrative Interpretation:   Old EKG Reviewed: none available  CRITICAL CARE Performed by: Curly Rim, DAVID  Total critical care time: 45  Critical care time was exclusive of separately billable procedures and treating other patients.  Critical care was necessary to treat or prevent imminent or life-threatening deterioration.  Critical care was time spent personally by me on the following activities: development of treatment plan with patient and/or surrogate as well as nursing, discussions with consultants, evaluation of patient's response to treatment, examination of patient, obtaining history from patient or  surrogate, ordering and performing treatments and interventions, ordering and review of laboratory studies, ordering and review of radiographic studies, pulse oximetry and re-evaluation of patient's condition.  MDM   Final diagnoses:  Anterior cord syndrome  Fall   80 year old white male with multiple medical problems presents to the emergency department by EMS. Patient was found down on the sidewalk outside his home after he presumably fell down. He has bilateral upper extremity paralysis which is concerning for anterior cord syndrome.  Stat imaging ordered as well as basic labwork.    Spoke to NS - concern for anterior cord syndrome.  Per NS concern for AS throughout spine and possible C3-4 fracture.  He recommended to c/w MRI and c/s Trauma Surgery for admission.    C/S trauma surgery, Dr. Hulen Skains, who recommended admit to Trauma with Medicine consultation.     11:13 AM  VSS.  Pt in nad.  Pt and wife updated.  MRI pending.  C/s Medicine.  Dr. Hulen Skains at bedside.    11:47 AM AFVSS.  Pt evaluated by NS, Trauma, medicine.  Plan for admission.  MRI pending.  No issues during ER stay.  Admit trauma service.    Elmer Sow, MD 08/30/13 [phone]

## 2013-08-30 NOTE — Consult Note (Signed)
Medical Consultation   John Carter  KXF:[id number]  DOB: 09/01/1929  DOA: 08/30/2013  PCP: Horton Finer, MD  Requesting physician:  Trauma service  Reason for consultation: Medical co-management  History of Present Illness: The patient is 80 year old male with history of multiple myeloma, coronary artery disease, hyperlipidemia, hypertension, prior CVA/TIA, chronic pancytopenia with thrombocytopenia and anemia, chronic back pain presented to the ER after mechanical fall. History was obtained from the patient's wife and patient himself. According to the patient's wife, he was in his front porch/driveway, trying to put insulation for the winter and cover some vents under the house, when he had a mechanical fall. Patient denied any chest pain, shortness of breath, dizziness or a syncopal episode. He feels that he may have tripped over something and fell on his face down. He was not able to get up. Patient was noticed to have fallen down by family who was driving by, who called 911 and alerted the patient's wife. EMS found the patient face down on both arms and unable to move his arms . Otherwise he is alert and oriented with a cervical collar, dried blood on the face and from the nose.   ER workup showed CT head and C-spine, chronic ethmoid and left maxillary sinusitis, suspected bilateral nasal fractures, nondisplaced, no acute intracranial findings, no acute cervical spine fracture or acute subluxation.  Allergies:   Allergies  Allergen Reactions  . Sulfa Drugs Cross Reactors Rash      Past Medical History  Diagnosis Date  . Angina   . Stroke   . Multiple myeloma 06/2009    was on chemo  . Melanoma   . Hypertension     amlodipine and metoprolol  . Hyperlipidemia     takes Zocor daily  . Coronary artery disease     2 stents placed in 2001  . Myocardial infarction 2001  . Sleep apnea     sleep study done 71yr ago and pt does use a CPAP;setting of 10  . TIA  (transient ischemic attack) 2007  . Arthritis     left knee  . Chronic back pain     compression fracture,degenerative disc disease  . Bruises easily     pt is on Plavix and Aspirin  . GERD (gastroesophageal reflux disease)     takes Omeprazole daily  . Hiatal hernia   . Gastric ulcer   . Diverticulosis   . Hemorrhoids   . History of colonic polyps   . Urinary frequency   . Nocturia   . Leg cramps   . Cataracts, bilateral   . Hiatal hernia   . Other pancytopenia 04/16/2013  . Knee pain, acute 08/27/2013    Past Surgical History  Procedure Laterality Date  . Back surgery  80's  . Tonsillectomy      as a  child  . Colonoscopy    . Appendectomy      as a child  . Knee surgery  1997    left knee arthroscopy  . Knee arthroscopy  1999    right  . Coronary angioplasty  09/04/1999  . Total knee arthroplasty  06/28/2011    Procedure: TOTAL KNEE ARTHROPLASTY;  Surgeon: RLorn Junes MD;  Location: [facility]  Service: Orthopedics;  Laterality: Left;  TOTAL KNEE ARTHROPLASTY LEFT SIDE    Social History:  reports that he has never smoked. He has never used smokeless tobacco. He reports that he does not drink alcohol or use illicit drugs.  Family  History  Problem Relation Age of Onset  . Anesthesia problems Neg Hx   . Hypotension Neg Hx   . Malignant hyperthermia Neg Hx   . Pseudochol deficiency Neg Hx   . Heart attack Mother   . Heart attack Father   . Diabetes Father   . Cancer Sister   . Heart attack Brother   . Diabetes Brother     Review of Systems:  Review of Systems:  Constitutional: Denies fever, chills, diaphoresis, appetite change and fatigue.  HEENT: Denies photophobia, eye pain, redness, hearing loss, ear pain, congestion, sore throat, rhinorrhea, sneezing, mouth sores.  Respiratory: Denies SOB, DOE, cough, chest tightness,  and wheezing.   Cardiovascular: Denies chest pain, palpitations and leg swelling.  Gastrointestinal: Denies nausea, vomiting, abdominal  pain, diarrhea, constipation, blood in stool and abdominal distention.  Genitourinary: Denies dysuria, urgency, frequency, hematuria, flank pain and difficulty urinating.  Musculoskeletal:  Patient has chronic back pain issues but has been fairly active  Skin: Denies pallor, rash and wound.  Neurological:  please see history of present illness  Hematological: Denies adenopathy. Easy bruising + due to chronic thrombocytopenia from pancytopenia  Psychiatric/Behavioral: Denies suicidal ideation, mood changes, confusion, nervousness, sleep disturbance and agitation   Physical Exam: Blood pressure 163/67, pulse 89, temperature 98.5 F (36.9 C), temperature source Oral, resp. rate 22, height _0  (1.727 m), weight 87.091 kg (192 lb), SpO2 96.00%.  General: Alert and awake, oriented x3, not in any acute distress. HEENT: C-collar on, contusions to the forehead and small abrasions and lacerations to the face, dried blood in the nails but no active bleeding  CVS: S1-S2 clear, no murmur rubs or gallops Chest: clear to auscultation bilaterally, no wheezing, rales or rhonchi Abdomen: soft nontender, nondistended, normal bowel sounds, no organomegaly Extremities: no cyanosis, clubbing or edema noted bilaterally Neuro: Alert and oriented x3, follows commands but unable to move bilateral upper extremity , lower extremities 5/5 Psych: alert and oriented, Skin: Multiple abrasions on the face  Labs on Admission:  Basic Metabolic Panel:  Recent Labs Lab 08/27/13 0824  NA 141  K 3.7  CO2 20*  GLUCOSE 195*  BUN 26.7*  CREATININE 1.2  CALCIUM 7.5*   Liver Function Tests:  Recent Labs Lab 08/27/13 0824  AST 17  ALT 28  ALKPHOS 80  BILITOT 0.33  PROT 6.8  ALBUMIN 3.4*   No results found for this basename: LIPASE, AMYLASE,  in the last 168 hours No results found for this basename: AMMONIA,  in the last 168 hours CBC:  Recent Labs Lab 08/27/13 0824 08/30/13 1025  WBC 8.5 9.4   NEUTROABS 4.4 4.9  HGB 11.8* 10.7*  HCT 35.5* 31.8*  MCV 97.9 96.1  PLT 63* 57*   Cardiac Enzymes: No results found for this basename: CKTOTAL, CKMB, CKMBINDEX, TROPONINI,  in the last 168 hours BNP: No components found with this basename: POCBNP,  CBG: No results found for this basename: GLUCAP,  in the last 168 hours  Inpatient Medications:   Scheduled Meds: Continuous Infusions:   Radiological Exams on Admission: Ct Head Wo Contrast  08/30/2013   CLINICAL DATA:  Fall onto face.  Unable to move arms.  EXAM: CT HEAD WITHOUT CONTRAST  CT CERVICAL SPINE WITHOUT CONTRAST  TECHNIQUE: Multidetector CT imaging of the head and cervical spine was performed following the standard protocol without intravenous contrast. Multiplanar CT image reconstructions of the cervical spine were also generated.  COMPARISON:  DG BONE SURVEY MET dated 12/30/2011; MR HEAD  W/O CM dated 09/03/2005; CT HEAD W/O CM dated 08/26/2005  FINDINGS: CT HEAD FINDINGS  The brainstem, cerebellum, cerebral peduncles, thalamus, basal ganglia, basilar cisterns, and ventricular system appear within normal limits. Periventricular white matter and corona radiata hypodensities favor chronic ischemic microvascular white matter disease. No intracranial hemorrhage, mass lesion, or acute CVA.  Right frontal forehead scalp soft tissue swelling. Mild chronic left maxillary sinusitis. Diffuse chronic ethmoid sinusitis with mucosal thickening in the nasal cavity.  Irregularity of the head bilateral nasal bones, increased from 08/26/2005, and suspicious for nasal bone fractures. There is dense calcification along the transverse ligament at C1-2. There is atherosclerotic calcification of the cavernous carotid arteries bilaterally.  CT CERVICAL SPINE FINDINGS  The cervical spondylosis with interbody bridging spurring extending at all levels between C4 and T2. Uncinate and facet spurring causes osseous foraminal stenosis at a variety of levels but most  strikingly at C2-3 and C3-4 bilaterally. Bony fusion across the facet joints is present on the left at C4-C5-C6 and on the right at C5-6. There is posterior osseous ridging at C3-4, C5-6, and C6-7. No fracture or acute subluxation observed. There is atherosclerotic calcification of the carotid bulbs.  Left thyroid lesion, 1.5 x 0.7 cm, hypodense, without internal calcifications.  IMPRESSION: 1. Chronic ethmoid and left maxillary sinusitis with mucosal thickening in the nasal cavity. 2. Suspected bilateral nasal fractures, relatively nondisplaced. 3. Atherosclerosis. 4. No acute intracranial findings. 5. Periventricular white matter and corona radiata hypodensities favor chronic ischemic microvascular white matter disease. 6. Extensive cervical spondylosis with multilevel bridging intervertebral and facet spurring, and spurring causing multilevel foraminal impingement. No acute cervical spine fracture or acute subluxation observed.   Electronically Signed   By: Sherryl Barters M.D.   On: 08/30/2013 10:59   Ct Cervical Spine Wo Contrast  08/30/2013   CLINICAL DATA:  Fall onto face.  Unable to move arms.  EXAM: CT HEAD WITHOUT CONTRAST  CT CERVICAL SPINE WITHOUT CONTRAST  TECHNIQUE: Multidetector CT imaging of the head and cervical spine was performed following the standard protocol without intravenous contrast. Multiplanar CT image reconstructions of the cervical spine were also generated.  COMPARISON:  DG BONE SURVEY MET dated 12/30/2011; MR HEAD W/O CM dated 09/03/2005; CT HEAD W/O CM dated 08/26/2005  FINDINGS: CT HEAD FINDINGS  The brainstem, cerebellum, cerebral peduncles, thalamus, basal ganglia, basilar cisterns, and ventricular system appear within normal limits. Periventricular white matter and corona radiata hypodensities favor chronic ischemic microvascular white matter disease. No intracranial hemorrhage, mass lesion, or acute CVA.  Right frontal forehead scalp soft tissue swelling. Mild chronic left  maxillary sinusitis. Diffuse chronic ethmoid sinusitis with mucosal thickening in the nasal cavity.  Irregularity of the head bilateral nasal bones, increased from 08/26/2005, and suspicious for nasal bone fractures. There is dense calcification along the transverse ligament at C1-2. There is atherosclerotic calcification of the cavernous carotid arteries bilaterally.  CT CERVICAL SPINE FINDINGS  The cervical spondylosis with interbody bridging spurring extending at all levels between C4 and T2. Uncinate and facet spurring causes osseous foraminal stenosis at a variety of levels but most strikingly at C2-3 and C3-4 bilaterally. Bony fusion across the facet joints is present on the left at C4-C5-C6 and on the right at C5-6. There is posterior osseous ridging at C3-4, C5-6, and C6-7. No fracture or acute subluxation observed. There is atherosclerotic calcification of the carotid bulbs.  Left thyroid lesion, 1.5 x 0.7 cm, hypodense, without internal calcifications.  IMPRESSION: 1. Chronic ethmoid and left maxillary sinusitis  with mucosal thickening in the nasal cavity. 2. Suspected bilateral nasal fractures, relatively nondisplaced. 3. Atherosclerosis. 4. No acute intracranial findings. 5. Periventricular white matter and corona radiata hypodensities favor chronic ischemic microvascular white matter disease. 6. Extensive cervical spondylosis with multilevel bridging intervertebral and facet spurring, and spurring causing multilevel foraminal impingement. No acute cervical spine fracture or acute subluxation observed.   Electronically Signed   By: Sherryl Barters M.D.   On: 08/30/2013 10:59   Dg Chest Port 1 View  08/30/2013   CLINICAL DATA:  Status post fall.  Facial lacerations.  EXAM: PORTABLE CHEST - 1 VIEW  COMPARISON:  PA and lateral chest 06/22/2011.  FINDINGS: Lungs are clear. Heart size is normal. No pneumothorax or pleural effusion. No focal bony abnormality. Degenerative change about the shoulders noted.   IMPRESSION: No acute disease.   Electronically Signed   By: Inge Rise M.D.   On: 08/30/2013 10:49   Dg Knee Right Port  08/30/2013   CLINICAL DATA:  Fall.  Abrasions along the knee.  EXAM: PORTABLE RIGHT KNEE - 1-2 VIEW  COMPARISON:  None.  FINDINGS: Severe osteoarthritis. Distal SFA and popliteal artery atherosclerosis. Trace knee effusion. The severe loss of medial compartmental articular space.  No discrete fracture observed.  IMPRESSION: 1. No discrete fracture is observed on this two view series. 2. Severe osteoarthritis. The severity of spurring causes cortical irregularity which can reduce sensitivity for subtle fractures. 3. Trace knee effusion. 4. Atherosclerosis.   Electronically Signed   By: Sherryl Barters M.D.   On: 08/30/2013 10:38    Impression/Recommendations Principal Problem:   Central cord syndrome with mechanical fall - Management per trauma team and neurosurgery. MRI of the cervical spine and thoracic spine is still pending. - Patient will likely need to be in the ICU with serial neurochecks, IV Decadron, IV fluids, n.p.o. for now if neurosurgery planned - Patient has a known history of pancytopenia due to multiple myeloma, platelets 57K, would recommend 1 unit packed RBC with one pheresis platelets today especially if surgery is planned.   Active Problems:   Multiple myeloma with chronic pancytopenia,thrombocytopenia and anemia - platelets 57K, baseline in 60s, would recommend 1 unit packed RBC with one pheresis platelets today especially if surgery is planned.  - He's currently on a chemotherapy under the care of Dr. Alvy Bimler at [facility]. - Please continue IV Decadron.  - Hold antiplatelet agents, aspirin and Plavix for now, for likely surgery and bleeding    Hypertension - If patient is able to take his oral medications, can continue metoprolol, amlodipine - BMET is till pending    Hyperlipidemia - On statins    Coronary artery disease patient denied  any chest pain, shortness of breath or any syncopal episode - Currently aspirin, Plavix on hold for possible surgery - Will continue beta blocker, Diovan, statins if patient is able to take oral meds     GERD (gastroesophageal reflux disease) - Continue PPI    Fall -PTOT evaluation when medically stable     Dr. Maxwell Caul will followup  in the morning 08/31/13 and assumed care for the medical consultation. Please contact me if I can be of assistance in the meanwhile. Thank you for this consultation.  Time Spent on Admission: 1 hour  RAI,RIPUDEEP M.D. [facility] 08/30/2013, 11:54 AM

## 2013-08-30 NOTE — ED Notes (Signed)
Dr. Stern at bedside

## 2013-08-30 NOTE — Progress Notes (Signed)
Chaplain's Note:  Responded to Trauma page for pt.  I met patient and his wife, John Carter who was at his bedside. He was alert and talking to her at the time. During time radiology was doing x-rays I talked with pt's wife.She is anxious and concerned about what caused his apparent fall. She said he had a history of a couple of heart attacks and a stroke, but had seemed to be doing well. He had gone outside to cover some vents under the house in anticipation of the cold weather and didn't want his pipes to freeze.   They have no children. She indicated they have good friends and neighbors.  She did not feel the need to have anyone called at this time.  I will continue to follow up and provide support.   Wells Guiles Gomer - [phone]

## 2013-08-30 NOTE — ED Notes (Signed)
Dr Wyatt at bedside.  

## 2013-08-30 NOTE — Telephone Encounter (Signed)
Mailed appt for [facility] to pt

## 2013-08-31 LAB — BASIC METABOLIC PANEL
BUN: 12 mg/dL (ref 6–23)
CO2: 23 meq/L (ref 19–32)
CREATININE: 0.98 mg/dL (ref 0.50–1.35)
Calcium: 7.3 mg/dL — ABNORMAL LOW (ref 8.4–10.5)
Chloride: 98 mEq/L (ref 96–112)
GFR calc Af Amer: 87 mL/min — ABNORMAL LOW (ref >90)
GFR calc non Af Amer: 75 mL/min — ABNORMAL LOW (ref >90)
Glucose, Bld: 174 mg/dL — ABNORMAL HIGH (ref 70–99)
Potassium: 3.4 mEq/L — ABNORMAL LOW (ref 3.7–5.3)
Sodium: 137 mEq/L (ref 137–147)

## 2013-08-31 LAB — CBC
HEMATOCRIT: 29.8 % — AB (ref 39.0–52.0)
HEMOGLOBIN: 10.1 g/dL — AB (ref 13.0–17.0)
MCH: 32.3 pg (ref 26.0–34.0)
MCHC: 33.9 g/dL (ref 30.0–36.0)
MCV: 95.2 fL (ref 78.0–100.0)
Platelets: 58 10*3/uL — ABNORMAL LOW (ref 150–400)
RBC: 3.13 MIL/uL — AB (ref 4.22–5.81)
RDW: 14.3 % (ref 11.5–15.5)
WBC: 11 10*3/uL — ABNORMAL HIGH (ref 4.0–10.5)

## 2013-08-31 MED ORDER — DEXAMETHASONE SODIUM PHOSPHATE 4 MG/ML IJ SOLN
4.0000 mg | Freq: Four times a day (QID) | INTRAMUSCULAR | Status: DC
  Administered 2013-08-31 – 2013-09-05 (×22): 4 mg via INTRAVENOUS
  Filled 2013-08-31 (×29): qty 1

## 2013-08-31 NOTE — Progress Notes (Signed)
Assessment/Plan: Principal Problem:   Central cord syndrome - per trauma service. Active Problems:   Multiple myeloma   Hypertension - BP is adequately controlled.    Hyperlipidemia   Coronary artery disease - stable. He would be able to tolerate surgery from a CV point of view.    GERD (gastroesophageal reflux disease)   Thrombocytopenia   Anemia   Fall   Fever - has temp to 101 and WBC is up to 11k. No localizing signs or symptoms. CXR on admit was normal. UA negative as well. Could consider repeating CXR but will not order at this point.    Subjective: His neck is hurting a bit. He is getting pain meds intermittently as needed.   Objective:  Vital Signs: Filed Vitals:   08/31/13 0300 08/31/13 0321 08/31/13 0400 08/31/13 0500  BP: 129/53  140/55 133/52  Pulse: 72  76 71  Temp: 100.8 F (38.2 C) 99.4 F (37.4 C) 100.9 F (38.3 C) 101.1 F (38.4 C)  TempSrc:  Oral    Resp: $Remo'12  15 11  'FEeUW$ Height:      Weight:      SpO2: 99%  98% 98%     EXAM: LUNGS: clear  CV: RRR.   NEURO: can't move extremities; Ox3.    Intake/Output Summary (Last 24 hours) at 08/31/13 0635 Last data filed at 08/31/13 0500  Gross per 24 hour  Intake   1840 ml  Output   4655 ml  Net  -2815 ml    Lab Results:  Recent Labs  08/30/13 1025 08/31/13 0325  NA 141 137  K 3.5* 3.4*  CL 104 98  CO2 23 23  GLUCOSE 218* 174*  BUN 17 12  CREATININE 0.98 0.98  CALCIUM 7.4* 7.3*    Recent Labs  08/30/13 1025  AST 21  ALT 23  ALKPHOS 85  BILITOT 0.4  PROT 6.8  ALBUMIN 3.2*   No results found for this basename: LIPASE, AMYLASE,  in the last 72 hours  Recent Labs  08/30/13 1025 08/31/13 0325  WBC 9.4 11.0*  NEUTROABS 4.9  --   HGB 10.7* 10.1*  HCT 31.8* 29.8*  MCV 96.1 95.2  PLT 57* 58*   No results found for this basename: CKTOTAL, CKMB, CKMBINDEX, TROPONINI,  in the last 72 hours BNP No results found for this basename: probnp   No results found for this basename: DDIMER,  in  the last 72 hours No results found for this basename: HGBA1C,  in the last 72 hours No results found for this basename: CHOL, HDL, LDLCALC, TRIG, CHOLHDL, LDLDIRECT,  in the last 72 hours No results found for this basename: TSH, T4TOTAL, FREET3, T3FREE, THYROIDAB,  in the last 72 hours No results found for this basename: VITAMINB12, FOLATE, FERRITIN, TIBC, IRON, RETICCTPCT,  in the last 72 hours  Studies/Results: Ct Head Wo Contrast  08/30/2013   CLINICAL DATA:  Fall onto face.  Unable to move arms.  EXAM: CT HEAD WITHOUT CONTRAST  CT CERVICAL SPINE WITHOUT CONTRAST  TECHNIQUE: Multidetector CT imaging of the head and cervical spine was performed following the standard protocol without intravenous contrast. Multiplanar CT image reconstructions of the cervical spine were also generated.  COMPARISON:  DG BONE SURVEY MET dated 12/30/2011; MR HEAD W/O CM dated 09/03/2005; CT HEAD W/O CM dated 08/26/2005  FINDINGS: CT HEAD FINDINGS  The brainstem, cerebellum, cerebral peduncles, thalamus, basal ganglia, basilar cisterns, and ventricular system appear within normal limits. Periventricular white matter and corona radiata  hypodensities favor chronic ischemic microvascular white matter disease. No intracranial hemorrhage, mass lesion, or acute CVA.  Right frontal forehead scalp soft tissue swelling. Mild chronic left maxillary sinusitis. Diffuse chronic ethmoid sinusitis with mucosal thickening in the nasal cavity.  Irregularity of the head bilateral nasal bones, increased from 08/26/2005, and suspicious for nasal bone fractures. There is dense calcification along the transverse ligament at C1-2. There is atherosclerotic calcification of the cavernous carotid arteries bilaterally.  CT CERVICAL SPINE FINDINGS  The cervical spondylosis with interbody bridging spurring extending at all levels between C4 and T2. Uncinate and facet spurring causes osseous foraminal stenosis at a variety of levels but most strikingly at C2-3  and C3-4 bilaterally. Bony fusion across the facet joints is present on the left at C4-C5-C6 and on the right at C5-6. There is posterior osseous ridging at C3-4, C5-6, and C6-7. No fracture or acute subluxation observed. There is atherosclerotic calcification of the carotid bulbs.  Left thyroid lesion, 1.5 x 0.7 cm, hypodense, without internal calcifications.  IMPRESSION: 1. Chronic ethmoid and left maxillary sinusitis with mucosal thickening in the nasal cavity. 2. Suspected bilateral nasal fractures, relatively nondisplaced. 3. Atherosclerosis. 4. No acute intracranial findings. 5. Periventricular white matter and corona radiata hypodensities favor chronic ischemic microvascular white matter disease. 6. Extensive cervical spondylosis with multilevel bridging intervertebral and facet spurring, and spurring causing multilevel foraminal impingement. No acute cervical spine fracture or acute subluxation observed.   Electronically Signed   By: Sherryl Barters M.D.   On: 08/30/2013 10:59   Ct Cervical Spine Wo Contrast  08/30/2013   CLINICAL DATA:  Fall onto face.  Unable to move arms.  EXAM: CT HEAD WITHOUT CONTRAST  CT CERVICAL SPINE WITHOUT CONTRAST  TECHNIQUE: Multidetector CT imaging of the head and cervical spine was performed following the standard protocol without intravenous contrast. Multiplanar CT image reconstructions of the cervical spine were also generated.  COMPARISON:  DG BONE SURVEY MET dated 12/30/2011; MR HEAD W/O CM dated 09/03/2005; CT HEAD W/O CM dated 08/26/2005  FINDINGS: CT HEAD FINDINGS  The brainstem, cerebellum, cerebral peduncles, thalamus, basal ganglia, basilar cisterns, and ventricular system appear within normal limits. Periventricular white matter and corona radiata hypodensities favor chronic ischemic microvascular white matter disease. No intracranial hemorrhage, mass lesion, or acute CVA.  Right frontal forehead scalp soft tissue swelling. Mild chronic left maxillary sinusitis.  Diffuse chronic ethmoid sinusitis with mucosal thickening in the nasal cavity.  Irregularity of the head bilateral nasal bones, increased from 08/26/2005, and suspicious for nasal bone fractures. There is dense calcification along the transverse ligament at C1-2. There is atherosclerotic calcification of the cavernous carotid arteries bilaterally.  CT CERVICAL SPINE FINDINGS  The cervical spondylosis with interbody bridging spurring extending at all levels between C4 and T2. Uncinate and facet spurring causes osseous foraminal stenosis at a variety of levels but most strikingly at C2-3 and C3-4 bilaterally. Bony fusion across the facet joints is present on the left at C4-C5-C6 and on the right at C5-6. There is posterior osseous ridging at C3-4, C5-6, and C6-7. No fracture or acute subluxation observed. There is atherosclerotic calcification of the carotid bulbs.  Left thyroid lesion, 1.5 x 0.7 cm, hypodense, without internal calcifications.  IMPRESSION: 1. Chronic ethmoid and left maxillary sinusitis with mucosal thickening in the nasal cavity. 2. Suspected bilateral nasal fractures, relatively nondisplaced. 3. Atherosclerosis. 4. No acute intracranial findings. 5. Periventricular white matter and corona radiata hypodensities favor chronic ischemic microvascular white matter disease. 6. Extensive cervical  spondylosis with multilevel bridging intervertebral and facet spurring, and spurring causing multilevel foraminal impingement. No acute cervical spine fracture or acute subluxation observed.   Electronically Signed   By: Sherryl Barters M.D.   On: 08/30/2013 10:59   Mr Cervical Spine Wo Contrast  08/30/2013   CLINICAL DATA:  Bilateral upper extremity numbness after fall.  EXAM: MRI CERVICAL SPINE WITHOUT CONTRAST; MRI THORACIC SPINE WITHOUT CONTRAST  TECHNIQUE: Multiplanar, multisequence MR imaging of the cervical and thoracic spine was performed. No intravenous contrast was administered.  COMPARISON:   Cervical spine CT 08/30/2013  FINDINGS: Cervical spine: There is no marrow signal abnormality suggestive of occult fracture. Effusions present in the C3-4 facet joints bilaterally, likely chronic based on advanced degenerative changes and lack of disc edema. No definitive perivertebral edema. There is advanced spinal canal stenosis at C3-4, secondary to broad disc bulge and posterior ligamentous buckling and thickening. There is circumferential compression of the spinal cord, which has subtle T2 hyper intensity localized at the segment. No evidence of cord hemorrhage.  Incidental thyroid nodule in the posterior left lobe, no specific further evaluation suggested based on patient age.  C1-2: Degenerative ligamentous thickening around the dens.  C2-3: Advanced facet osteoarthritis with spurring causing bilateral foraminal stenosis. No significant canal stenosis.  C3-4: Advanced spinal canal stenosis as above, secondary to both disc bulging and posterior ligamentous buckling. Low T1 signal within the posterior elements bilaterally is likely from the degenerative sclerosis noted on the preceding CT. There is advanced bilateral foraminal stenosis.  C4-5: Interbody fusion by thin osteophytes or syndesmophytes. Facet osteoarthritis with bony overgrowth crowding the foramina.  C5-6: Advanced facet osteoarthritis with facet ankylosis. No advanced canal or foraminal stenosis.  C6-7: Interbody fusion. Advanced facet osteoarthritis with mild foraminal crowding.  Thoracic spine: No marrow signal abnormality suggestive of fracture. The marrow is heterogeneous diffusely, without focal mass like lesion. Remote compression fracture of L1, with minimal height loss. Normal cord signal and morphology. There is diffuse degenerative disc and facet disease. Spinal canal stenosis is most notable at T11-12, where there is disc bulging and dorsal ligamentous thickening. No cord compression. Foraminal stenosis is also most notable at this  level, related to endplate and facet spurs predominantly.  Critical Value/emergent results were called by telephone at the time of interpretation on 08/30/2013 at 3:14 PM to Dr. Elmer Sow , who verbally acknowledged these results.  IMPRESSION: 1. Nonhemorrhagic spinal cord contusion at C3-4, where there is advanced degenerative canal stenosis. 2. No evidence of occult cervical or thoracic fracture. 3. 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 4. Advanced cervical and thoracic degenerative disc and facet disease, as above.   Electronically Signed   By: Jorje Guild M.D.   On: 08/30/2013 15:19   Mr Thoracic Spine Wo Contrast  08/30/2013   CLINICAL DATA:  Bilateral upper extremity numbness after fall.  EXAM: MRI CERVICAL SPINE WITHOUT CONTRAST; MRI THORACIC SPINE WITHOUT CONTRAST  TECHNIQUE: Multiplanar, multisequence MR imaging of the cervical and thoracic spine was performed. No intravenous contrast was administered.  COMPARISON:  Cervical spine CT 08/30/2013  FINDINGS: Cervical spine: There is no marrow signal abnormality suggestive of occult fracture. Effusions present in the C3-4 facet joints bilaterally, likely chronic based on advanced degenerative changes and lack of disc edema. No definitive perivertebral edema. There is advanced spinal canal stenosis at C3-4, secondary to broad disc bulge and posterior ligamentous buckling and thickening. There is circumferential compression of the spinal cord, which  has subtle T2 hyper intensity localized at the segment. No evidence of cord hemorrhage.  Incidental thyroid nodule in the posterior left lobe, no specific further evaluation suggested based on patient age.  C1-2: Degenerative ligamentous thickening around the dens.  C2-3: Advanced facet osteoarthritis with spurring causing bilateral foraminal stenosis. No significant canal stenosis.  C3-4: Advanced spinal canal stenosis as above, secondary to both disc  bulging and posterior ligamentous buckling. Low T1 signal within the posterior elements bilaterally is likely from the degenerative sclerosis noted on the preceding CT. There is advanced bilateral foraminal stenosis.  C4-5: Interbody fusion by thin osteophytes or syndesmophytes. Facet osteoarthritis with bony overgrowth crowding the foramina.  C5-6: Advanced facet osteoarthritis with facet ankylosis. No advanced canal or foraminal stenosis.  C6-7: Interbody fusion. Advanced facet osteoarthritis with mild foraminal crowding.  Thoracic spine: No marrow signal abnormality suggestive of fracture. The marrow is heterogeneous diffusely, without focal mass like lesion. Remote compression fracture of L1, with minimal height loss. Normal cord signal and morphology. There is diffuse degenerative disc and facet disease. Spinal canal stenosis is most notable at T11-12, where there is disc bulging and dorsal ligamentous thickening. No cord compression. Foraminal stenosis is also most notable at this level, related to endplate and facet spurs predominantly.  Critical Value/emergent results were called by telephone at the time of interpretation on 08/30/2013 at 3:14 PM to Dr. Elmer Sow , who verbally acknowledged these results.  IMPRESSION: 1. Nonhemorrhagic spinal cord contusion at C3-4, where there is advanced degenerative canal stenosis. 2. No evidence of occult cervical or thoracic fracture. 3. 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 4. Advanced cervical and thoracic degenerative disc and facet disease, as above.   Electronically Signed   By: Jorje Guild M.D.   On: 08/30/2013 15:19   Dg Chest Port 1 View  08/30/2013   CLINICAL DATA:  Status post fall.  Facial lacerations.  EXAM: PORTABLE CHEST - 1 VIEW  COMPARISON:  PA and lateral chest 06/22/2011.  FINDINGS: Lungs are clear. Heart size is normal. No pneumothorax or pleural effusion. No focal bony abnormality.  Degenerative change about the shoulders noted.  IMPRESSION: No acute disease.   Electronically Signed   By: Inge Rise M.D.   On: 08/30/2013 10:49   Dg Knee Right Port  08/30/2013   CLINICAL DATA:  Fall.  Abrasions along the knee.  EXAM: PORTABLE RIGHT KNEE - 1-2 VIEW  COMPARISON:  None.  FINDINGS: Severe osteoarthritis. Distal SFA and popliteal artery atherosclerosis. Trace knee effusion. The severe loss of medial compartmental articular space.  No discrete fracture observed.  IMPRESSION: 1. No discrete fracture is observed on this two view series. 2. Severe osteoarthritis. The severity of spurring causes cortical irregularity which can reduce sensitivity for subtle fractures. 3. Trace knee effusion. 4. Atherosclerosis.   Electronically Signed   By: Sherryl Barters M.D.   On: 08/30/2013 10:38   Medications: Medications administered in the last 24 hours reviewed.  Current Medication List reviewed.    LOS: 1 day   [facility] Internal Medicine @ [facility] [phone]) 08/31/2013, 6:35 AM

## 2013-08-31 NOTE — Progress Notes (Signed)
UR completed.  Ravenna Legore, RN BSN MHA CCM Trauma/Neuro ICU Case Manager [phone]  

## 2013-08-31 NOTE — Progress Notes (Signed)
Inpatient Diabetes Program Recommendations  AACE/ADA: New Consensus Statement on Inpatient Glycemic Control (2013)  Target Ranges:  Prepandial:   less than 140 mg/dL      Peak postprandial:   less than 180 mg/dL (1-2 hours)      Critically ill patients:  140 - 180 mg/dL     Results for DEMON, VOLANTE (MRN [id number]) as of 08/31/2013 09:31  Ref. Range 08/30/2013 10:25 08/31/2013 03:25  Glucose Latest Range: 70-99 mg/dl 218 (H) 174 (H)    **Patient getting IV Decadron 4 mg Q6 hours.   **MD- Please consider starting Novolog Sensitive SSI tid ac + HS while patient on steroids if glucose levels elevated   Will follow. Wyn Quaker RN, MSN, CDE Diabetes Coordinator Inpatient Diabetes Program Team Pager: [phone] (8a-10p)

## 2013-08-31 NOTE — Progress Notes (Signed)
Report given to Nurse Almyra Free, patient was made aware of the change.

## 2013-08-31 NOTE — Progress Notes (Signed)
Subjective: Patient reports still unable to move arms.  Legs spasming  Objective: Vital signs in last 24 hours: Temp:  [98.5 F (36.9 C)-101.1 F (38.4 C)] 100.9 F (38.3 C) (02/13 0700) Pulse Rate:  [68-103] 83 (02/13 0700) Resp:  [10-23] 16 (02/13 0700) BP: (124-165)/(48-88) 145/49 mmHg (02/13 0700) SpO2:  [94 %-99 %] 98 % (02/13 0700) Weight:  [87.091 kg (192 lb)-87.68 kg (193 lb 4.8 oz)] 87.68 kg (193 lb 4.8 oz) (02/12 1406)  Intake/Output from previous day: 02/12 0701 - 02/13 0700 In: 1990 [P.O.:750; I.V.:1240] Out: 4775 [Urine:4775] Intake/Output this shift: Total I/O In: 75 [I.V.:75] Out: 100 [Urine:100]  Physical Exam: Shrugs shoulders now, but no movement upper extremities any motor group.  Does have diminished sensation.  Legs with increased tone, withdraws both.  Moves feet.  Can draw legs up with Hip Flexors.  Lab Results:  Recent Labs  08/30/13 1025 08/31/13 0325  WBC 9.4 11.0*  HGB 10.7* 10.1*  HCT 31.8* 29.8*  PLT 57* 58*   BMET  Recent Labs  08/30/13 1025 08/31/13 0325  NA 141 137  K 3.5* 3.4*  CL 104 98  CO2 23 23  GLUCOSE 218* 174*  BUN 17 12  CREATININE 0.98 0.98  CALCIUM 7.4* 7.3*    Studies/Results: Ct Head Wo Contrast  08/30/2013   CLINICAL DATA:  Fall onto face.  Unable to move arms.  EXAM: CT HEAD WITHOUT CONTRAST  CT CERVICAL SPINE WITHOUT CONTRAST  TECHNIQUE: Multidetector CT imaging of the head and cervical spine was performed following the standard protocol without intravenous contrast. Multiplanar CT image reconstructions of the cervical spine were also generated.  COMPARISON:  DG BONE SURVEY MET dated 12/30/2011; MR HEAD W/O CM dated 09/03/2005; CT HEAD W/O CM dated 08/26/2005  FINDINGS: CT HEAD FINDINGS  The brainstem, cerebellum, cerebral peduncles, thalamus, basal ganglia, basilar cisterns, and ventricular system appear within normal limits. Periventricular white matter and corona radiata hypodensities favor chronic ischemic  microvascular white matter disease. No intracranial hemorrhage, mass lesion, or acute CVA.  Right frontal forehead scalp soft tissue swelling. Mild chronic left maxillary sinusitis. Diffuse chronic ethmoid sinusitis with mucosal thickening in the nasal cavity.  Irregularity of the head bilateral nasal bones, increased from 08/26/2005, and suspicious for nasal bone fractures. There is dense calcification along the transverse ligament at C1-2. There is atherosclerotic calcification of the cavernous carotid arteries bilaterally.  CT CERVICAL SPINE FINDINGS  The cervical spondylosis with interbody bridging spurring extending at all levels between C4 and T2. Uncinate and facet spurring causes osseous foraminal stenosis at a variety of levels but most strikingly at C2-3 and C3-4 bilaterally. Bony fusion across the facet joints is present on the left at C4-C5-C6 and on the right at C5-6. There is posterior osseous ridging at C3-4, C5-6, and C6-7. No fracture or acute subluxation observed. There is atherosclerotic calcification of the carotid bulbs.  Left thyroid lesion, 1.5 x 0.7 cm, hypodense, without internal calcifications.  IMPRESSION: 1. Chronic ethmoid and left maxillary sinusitis with mucosal thickening in the nasal cavity. 2. Suspected bilateral nasal fractures, relatively nondisplaced. 3. Atherosclerosis. 4. No acute intracranial findings. 5. Periventricular white matter and corona radiata hypodensities favor chronic ischemic microvascular white matter disease. 6. Extensive cervical spondylosis with multilevel bridging intervertebral and facet spurring, and spurring causing multilevel foraminal impingement. No acute cervical spine fracture or acute subluxation observed.   Electronically Signed   By: Sherryl Barters M.D.   On: 08/30/2013 10:59   Ct Cervical Spine Wo  Contrast  08/30/2013   CLINICAL DATA:  Fall onto face.  Unable to move arms.  EXAM: CT HEAD WITHOUT CONTRAST  CT CERVICAL SPINE WITHOUT CONTRAST   TECHNIQUE: Multidetector CT imaging of the head and cervical spine was performed following the standard protocol without intravenous contrast. Multiplanar CT image reconstructions of the cervical spine were also generated.  COMPARISON:  DG BONE SURVEY MET dated 12/30/2011; MR HEAD W/O CM dated 09/03/2005; CT HEAD W/O CM dated 08/26/2005  FINDINGS: CT HEAD FINDINGS  The brainstem, cerebellum, cerebral peduncles, thalamus, basal ganglia, basilar cisterns, and ventricular system appear within normal limits. Periventricular white matter and corona radiata hypodensities favor chronic ischemic microvascular white matter disease. No intracranial hemorrhage, mass lesion, or acute CVA.  Right frontal forehead scalp soft tissue swelling. Mild chronic left maxillary sinusitis. Diffuse chronic ethmoid sinusitis with mucosal thickening in the nasal cavity.  Irregularity of the head bilateral nasal bones, increased from 08/26/2005, and suspicious for nasal bone fractures. There is dense calcification along the transverse ligament at C1-2. There is atherosclerotic calcification of the cavernous carotid arteries bilaterally.  CT CERVICAL SPINE FINDINGS  The cervical spondylosis with interbody bridging spurring extending at all levels between C4 and T2. Uncinate and facet spurring causes osseous foraminal stenosis at a variety of levels but most strikingly at C2-3 and C3-4 bilaterally. Bony fusion across the facet joints is present on the left at C4-C5-C6 and on the right at C5-6. There is posterior osseous ridging at C3-4, C5-6, and C6-7. No fracture or acute subluxation observed. There is atherosclerotic calcification of the carotid bulbs.  Left thyroid lesion, 1.5 x 0.7 cm, hypodense, without internal calcifications.  IMPRESSION: 1. Chronic ethmoid and left maxillary sinusitis with mucosal thickening in the nasal cavity. 2. Suspected bilateral nasal fractures, relatively nondisplaced. 3. Atherosclerosis. 4. No acute intracranial  findings. 5. Periventricular white matter and corona radiata hypodensities favor chronic ischemic microvascular white matter disease. 6. Extensive cervical spondylosis with multilevel bridging intervertebral and facet spurring, and spurring causing multilevel foraminal impingement. No acute cervical spine fracture or acute subluxation observed.   Electronically Signed   By: Sherryl Barters M.D.   On: 08/30/2013 10:59   Mr Cervical Spine Wo Contrast  08/30/2013   CLINICAL DATA:  Bilateral upper extremity numbness after fall.  EXAM: MRI CERVICAL SPINE WITHOUT CONTRAST; MRI THORACIC SPINE WITHOUT CONTRAST  TECHNIQUE: Multiplanar, multisequence MR imaging of the cervical and thoracic spine was performed. No intravenous contrast was administered.  COMPARISON:  Cervical spine CT 08/30/2013  FINDINGS: Cervical spine: There is no marrow signal abnormality suggestive of occult fracture. Effusions present in the C3-4 facet joints bilaterally, likely chronic based on advanced degenerative changes and lack of disc edema. No definitive perivertebral edema. There is advanced spinal canal stenosis at C3-4, secondary to broad disc bulge and posterior ligamentous buckling and thickening. There is circumferential compression of the spinal cord, which has subtle T2 hyper intensity localized at the segment. No evidence of cord hemorrhage.  Incidental thyroid nodule in the posterior left lobe, no specific further evaluation suggested based on patient age.  C1-2: Degenerative ligamentous thickening around the dens.  C2-3: Advanced facet osteoarthritis with spurring causing bilateral foraminal stenosis. No significant canal stenosis.  C3-4: Advanced spinal canal stenosis as above, secondary to both disc bulging and posterior ligamentous buckling. Low T1 signal within the posterior elements bilaterally is likely from the degenerative sclerosis noted on the preceding CT. There is advanced bilateral foraminal stenosis.  C4-5: Interbody  fusion by thin  osteophytes or syndesmophytes. Facet osteoarthritis with bony overgrowth crowding the foramina.  C5-6: Advanced facet osteoarthritis with facet ankylosis. No advanced canal or foraminal stenosis.  C6-7: Interbody fusion. Advanced facet osteoarthritis with mild foraminal crowding.  Thoracic spine: No marrow signal abnormality suggestive of fracture. The marrow is heterogeneous diffusely, without focal mass like lesion. Remote compression fracture of L1, with minimal height loss. Normal cord signal and morphology. There is diffuse degenerative disc and facet disease. Spinal canal stenosis is most notable at T11-12, where there is disc bulging and dorsal ligamentous thickening. No cord compression. Foraminal stenosis is also most notable at this level, related to endplate and facet spurs predominantly.  Critical Value/emergent results were called by telephone at the time of interpretation on 08/30/2013 at 3:14 PM to Dr. Elmer Sow , who verbally acknowledged these results.  IMPRESSION: 1. Nonhemorrhagic spinal cord contusion at C3-4, where there is advanced degenerative canal stenosis. 2. No evidence of occult cervical or thoracic fracture. 3. 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 4. Advanced cervical and thoracic degenerative disc and facet disease, as above.   Electronically Signed   By: Jorje Guild M.D.   On: 08/30/2013 15:19   Mr Thoracic Spine Wo Contrast  08/30/2013   CLINICAL DATA:  Bilateral upper extremity numbness after fall.  EXAM: MRI CERVICAL SPINE WITHOUT CONTRAST; MRI THORACIC SPINE WITHOUT CONTRAST  TECHNIQUE: Multiplanar, multisequence MR imaging of the cervical and thoracic spine was performed. No intravenous contrast was administered.  COMPARISON:  Cervical spine CT 08/30/2013  FINDINGS: Cervical spine: There is no marrow signal abnormality suggestive of occult fracture. Effusions present in the C3-4 facet joints bilaterally,  likely chronic based on advanced degenerative changes and lack of disc edema. No definitive perivertebral edema. There is advanced spinal canal stenosis at C3-4, secondary to broad disc bulge and posterior ligamentous buckling and thickening. There is circumferential compression of the spinal cord, which has subtle T2 hyper intensity localized at the segment. No evidence of cord hemorrhage.  Incidental thyroid nodule in the posterior left lobe, no specific further evaluation suggested based on patient age.  C1-2: Degenerative ligamentous thickening around the dens.  C2-3: Advanced facet osteoarthritis with spurring causing bilateral foraminal stenosis. No significant canal stenosis.  C3-4: Advanced spinal canal stenosis as above, secondary to both disc bulging and posterior ligamentous buckling. Low T1 signal within the posterior elements bilaterally is likely from the degenerative sclerosis noted on the preceding CT. There is advanced bilateral foraminal stenosis.  C4-5: Interbody fusion by thin osteophytes or syndesmophytes. Facet osteoarthritis with bony overgrowth crowding the foramina.  C5-6: Advanced facet osteoarthritis with facet ankylosis. No advanced canal or foraminal stenosis.  C6-7: Interbody fusion. Advanced facet osteoarthritis with mild foraminal crowding.  Thoracic spine: No marrow signal abnormality suggestive of fracture. The marrow is heterogeneous diffusely, without focal mass like lesion. Remote compression fracture of L1, with minimal height loss. Normal cord signal and morphology. There is diffuse degenerative disc and facet disease. Spinal canal stenosis is most notable at T11-12, where there is disc bulging and dorsal ligamentous thickening. No cord compression. Foraminal stenosis is also most notable at this level, related to endplate and facet spurs predominantly.  Critical Value/emergent results were called by telephone at the time of interpretation on 08/30/2013 at 3:14 PM to Dr. Elmer Sow , who verbally acknowledged these results.  IMPRESSION: 1. Nonhemorrhagic spinal cord contusion at C3-4, where there is advanced degenerative canal stenosis. 2. No evidence of occult  cervical or thoracic fracture. 3. 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 4. Advanced cervical and thoracic degenerative disc and facet disease, as above.   Electronically Signed   By: Jorje Guild M.D.   On: 08/30/2013 15:19   Dg Chest Port 1 View  08/30/2013   CLINICAL DATA:  Status post fall.  Facial lacerations.  EXAM: PORTABLE CHEST - 1 VIEW  COMPARISON:  PA and lateral chest 06/22/2011.  FINDINGS: Lungs are clear. Heart size is normal. No pneumothorax or pleural effusion. No focal bony abnormality. Degenerative change about the shoulders noted.  IMPRESSION: No acute disease.   Electronically Signed   By: Inge Rise M.D.   On: 08/30/2013 10:49   Dg Knee Right Port  08/30/2013   CLINICAL DATA:  Fall.  Abrasions along the knee.  EXAM: PORTABLE RIGHT KNEE - 1-2 VIEW  COMPARISON:  None.  FINDINGS: Severe osteoarthritis. Distal SFA and popliteal artery atherosclerosis. Trace knee effusion. The severe loss of medial compartmental articular space.  No discrete fracture observed.  IMPRESSION: 1. No discrete fracture is observed on this two view series. 2. Severe osteoarthritis. The severity of spurring causes cortical irregularity which can reduce sensitivity for subtle fractures. 3. Trace knee effusion. 4. Atherosclerosis.   Electronically Signed   By: Sherryl Barters M.D.   On: 08/30/2013 10:38    Assessment/Plan: Severe central cord injury.  Mobilize with PT.  Will need Rehab.  Will need later posterior cervical decompression when plavix has worn off (10 days).  I discussed situation with patient and his wife and the difficult prognosis and uncertainty as to degree of recovery.    LOS: 1 day    Peggyann Shoals, MD 08/31/2013, 8:13 AM

## 2013-08-31 NOTE — Progress Notes (Signed)
Subjective: Pt with no acute changes.   Objective: Vital signs in last 24 hours: Temp:  [98.5 F (36.9 C)-101.3 F (38.5 C)] 101.3 F (38.5 C) (02/13 0900) Pulse Rate:  [68-103] 82 (02/13 0900) Resp:  [10-23] 16 (02/13 0900) BP: (124-165)/(48-88) 142/55 mmHg (02/13 0900) SpO2:  [94 %-99 %] 96 % (02/13 0900) Weight:  [192 lb (87.091 kg)-193 lb 4.8 oz (87.68 kg)] 193 lb 4.8 oz (87.68 kg) (02/12 1406) Last BM Date: 08/29/13  Intake/Output from previous day: 02/12 0701 - 02/13 0700 In: 1990 [P.O.:750; I.V.:1240] Out: 4775 [Urine:4775] Intake/Output this shift: Total I/O In: 150 [I.V.:150] Out: 100 [Urine:100]  Gen: Awake alert Neuro: LLE min movement, no BUE movement  Face: abrasions  Lab Results:   Recent Labs  08/30/13 1025 08/31/13 0325  WBC 9.4 11.0*  HGB 10.7* 10.1*  HCT 31.8* 29.8*  PLT 57* 58*   BMET  Recent Labs  08/30/13 1025 08/31/13 0325  NA 141 137  K 3.5* 3.4*  CL 104 98  CO2 23 23  GLUCOSE 218* 174*  BUN 17 12  CREATININE 0.98 0.98  CALCIUM 7.4* 7.3*   PT/INR  Recent Labs  08/30/13 1025  LABPROT 12.9  INR 0.99   ABG No results found for this basename: PHART, PCO2, PO2, HCO3,  in the last 72 hours  Studies/Results: Ct Head Wo Contrast  08/30/2013   CLINICAL DATA:  Fall onto face.  Unable to move arms.  EXAM: CT HEAD WITHOUT CONTRAST  CT CERVICAL SPINE WITHOUT CONTRAST  TECHNIQUE: Multidetector CT imaging of the head and cervical spine was performed following the standard protocol without intravenous contrast. Multiplanar CT image reconstructions of the cervical spine were also generated.  COMPARISON:  DG BONE SURVEY MET dated 12/30/2011; MR HEAD W/O CM dated 09/03/2005; CT HEAD W/O CM dated 08/26/2005  FINDINGS: CT HEAD FINDINGS  The brainstem, cerebellum, cerebral peduncles, thalamus, basal ganglia, basilar cisterns, and ventricular system appear within normal limits. Periventricular white matter and corona radiata hypodensities favor  chronic ischemic microvascular white matter disease. No intracranial hemorrhage, mass lesion, or acute CVA.  Right frontal forehead scalp soft tissue swelling. Mild chronic left maxillary sinusitis. Diffuse chronic ethmoid sinusitis with mucosal thickening in the nasal cavity.  Irregularity of the head bilateral nasal bones, increased from 08/26/2005, and suspicious for nasal bone fractures. There is dense calcification along the transverse ligament at C1-2. There is atherosclerotic calcification of the cavernous carotid arteries bilaterally.  CT CERVICAL SPINE FINDINGS  The cervical spondylosis with interbody bridging spurring extending at all levels between C4 and T2. Uncinate and facet spurring causes osseous foraminal stenosis at a variety of levels but most strikingly at C2-3 and C3-4 bilaterally. Bony fusion across the facet joints is present on the left at C4-C5-C6 and on the right at C5-6. There is posterior osseous ridging at C3-4, C5-6, and C6-7. No fracture or acute subluxation observed. There is atherosclerotic calcification of the carotid bulbs.  Left thyroid lesion, 1.5 x 0.7 cm, hypodense, without internal calcifications.  IMPRESSION: 1. Chronic ethmoid and left maxillary sinusitis with mucosal thickening in the nasal cavity. 2. Suspected bilateral nasal fractures, relatively nondisplaced. 3. Atherosclerosis. 4. No acute intracranial findings. 5. Periventricular white matter and corona radiata hypodensities favor chronic ischemic microvascular white matter disease. 6. Extensive cervical spondylosis with multilevel bridging intervertebral and facet spurring, and spurring causing multilevel foraminal impingement. No acute cervical spine fracture or acute subluxation observed.   Electronically Signed   By: Sherryl Barters M.D.  On: 08/30/2013 10:59   Ct Cervical Spine Wo Contrast  08/30/2013   CLINICAL DATA:  Fall onto face.  Unable to move arms.  EXAM: CT HEAD WITHOUT CONTRAST  CT CERVICAL SPINE  WITHOUT CONTRAST  TECHNIQUE: Multidetector CT imaging of the head and cervical spine was performed following the standard protocol without intravenous contrast. Multiplanar CT image reconstructions of the cervical spine were also generated.  COMPARISON:  DG BONE SURVEY MET dated 12/30/2011; MR HEAD W/O CM dated 09/03/2005; CT HEAD W/O CM dated 08/26/2005  FINDINGS: CT HEAD FINDINGS  The brainstem, cerebellum, cerebral peduncles, thalamus, basal ganglia, basilar cisterns, and ventricular system appear within normal limits. Periventricular white matter and corona radiata hypodensities favor chronic ischemic microvascular white matter disease. No intracranial hemorrhage, mass lesion, or acute CVA.  Right frontal forehead scalp soft tissue swelling. Mild chronic left maxillary sinusitis. Diffuse chronic ethmoid sinusitis with mucosal thickening in the nasal cavity.  Irregularity of the head bilateral nasal bones, increased from 08/26/2005, and suspicious for nasal bone fractures. There is dense calcification along the transverse ligament at C1-2. There is atherosclerotic calcification of the cavernous carotid arteries bilaterally.  CT CERVICAL SPINE FINDINGS  The cervical spondylosis with interbody bridging spurring extending at all levels between C4 and T2. Uncinate and facet spurring causes osseous foraminal stenosis at a variety of levels but most strikingly at C2-3 and C3-4 bilaterally. Bony fusion across the facet joints is present on the left at C4-C5-C6 and on the right at C5-6. There is posterior osseous ridging at C3-4, C5-6, and C6-7. No fracture or acute subluxation observed. There is atherosclerotic calcification of the carotid bulbs.  Left thyroid lesion, 1.5 x 0.7 cm, hypodense, without internal calcifications.  IMPRESSION: 1. Chronic ethmoid and left maxillary sinusitis with mucosal thickening in the nasal cavity. 2. Suspected bilateral nasal fractures, relatively nondisplaced. 3. Atherosclerosis. 4. No acute  intracranial findings. 5. Periventricular white matter and corona radiata hypodensities favor chronic ischemic microvascular white matter disease. 6. Extensive cervical spondylosis with multilevel bridging intervertebral and facet spurring, and spurring causing multilevel foraminal impingement. No acute cervical spine fracture or acute subluxation observed.   Electronically Signed   By: Sherryl Barters M.D.   On: 08/30/2013 10:59   Mr Cervical Spine Wo Contrast  08/30/2013   CLINICAL DATA:  Bilateral upper extremity numbness after fall.  EXAM: MRI CERVICAL SPINE WITHOUT CONTRAST; MRI THORACIC SPINE WITHOUT CONTRAST  TECHNIQUE: Multiplanar, multisequence MR imaging of the cervical and thoracic spine was performed. No intravenous contrast was administered.  COMPARISON:  Cervical spine CT 08/30/2013  FINDINGS: Cervical spine: There is no marrow signal abnormality suggestive of occult fracture. Effusions present in the C3-4 facet joints bilaterally, likely chronic based on advanced degenerative changes and lack of disc edema. No definitive perivertebral edema. There is advanced spinal canal stenosis at C3-4, secondary to broad disc bulge and posterior ligamentous buckling and thickening. There is circumferential compression of the spinal cord, which has subtle T2 hyper intensity localized at the segment. No evidence of cord hemorrhage.  Incidental thyroid nodule in the posterior left lobe, no specific further evaluation suggested based on patient age.  C1-2: Degenerative ligamentous thickening around the dens.  C2-3: Advanced facet osteoarthritis with spurring causing bilateral foraminal stenosis. No significant canal stenosis.  C3-4: Advanced spinal canal stenosis as above, secondary to both disc bulging and posterior ligamentous buckling. Low T1 signal within the posterior elements bilaterally is likely from the degenerative sclerosis noted on the preceding CT. There is advanced bilateral  foraminal stenosis.   C4-5: Interbody fusion by thin osteophytes or syndesmophytes. Facet osteoarthritis with bony overgrowth crowding the foramina.  C5-6: Advanced facet osteoarthritis with facet ankylosis. No advanced canal or foraminal stenosis.  C6-7: Interbody fusion. Advanced facet osteoarthritis with mild foraminal crowding.  Thoracic spine: No marrow signal abnormality suggestive of fracture. The marrow is heterogeneous diffusely, without focal mass like lesion. Remote compression fracture of L1, with minimal height loss. Normal cord signal and morphology. There is diffuse degenerative disc and facet disease. Spinal canal stenosis is most notable at T11-12, where there is disc bulging and dorsal ligamentous thickening. No cord compression. Foraminal stenosis is also most notable at this level, related to endplate and facet spurs predominantly.  Critical Value/emergent results were called by telephone at the time of interpretation on 08/30/2013 at 3:14 PM to Dr. Elmer Sow , who verbally acknowledged these results.  IMPRESSION: 1. Nonhemorrhagic spinal cord contusion at C3-4, where there is advanced degenerative canal stenosis. 2. No evidence of occult cervical or thoracic fracture. 3. 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 4. Advanced cervical and thoracic degenerative disc and facet disease, as above.   Electronically Signed   By: Jorje Guild M.D.   On: 08/30/2013 15:19   Mr Thoracic Spine Wo Contrast  08/30/2013   CLINICAL DATA:  Bilateral upper extremity numbness after fall.  EXAM: MRI CERVICAL SPINE WITHOUT CONTRAST; MRI THORACIC SPINE WITHOUT CONTRAST  TECHNIQUE: Multiplanar, multisequence MR imaging of the cervical and thoracic spine was performed. No intravenous contrast was administered.  COMPARISON:  Cervical spine CT 08/30/2013  FINDINGS: Cervical spine: There is no marrow signal abnormality suggestive of occult fracture. Effusions present in the C3-4 facet  joints bilaterally, likely chronic based on advanced degenerative changes and lack of disc edema. No definitive perivertebral edema. There is advanced spinal canal stenosis at C3-4, secondary to broad disc bulge and posterior ligamentous buckling and thickening. There is circumferential compression of the spinal cord, which has subtle T2 hyper intensity localized at the segment. No evidence of cord hemorrhage.  Incidental thyroid nodule in the posterior left lobe, no specific further evaluation suggested based on patient age.  C1-2: Degenerative ligamentous thickening around the dens.  C2-3: Advanced facet osteoarthritis with spurring causing bilateral foraminal stenosis. No significant canal stenosis.  C3-4: Advanced spinal canal stenosis as above, secondary to both disc bulging and posterior ligamentous buckling. Low T1 signal within the posterior elements bilaterally is likely from the degenerative sclerosis noted on the preceding CT. There is advanced bilateral foraminal stenosis.  C4-5: Interbody fusion by thin osteophytes or syndesmophytes. Facet osteoarthritis with bony overgrowth crowding the foramina.  C5-6: Advanced facet osteoarthritis with facet ankylosis. No advanced canal or foraminal stenosis.  C6-7: Interbody fusion. Advanced facet osteoarthritis with mild foraminal crowding.  Thoracic spine: No marrow signal abnormality suggestive of fracture. The marrow is heterogeneous diffusely, without focal mass like lesion. Remote compression fracture of L1, with minimal height loss. Normal cord signal and morphology. There is diffuse degenerative disc and facet disease. Spinal canal stenosis is most notable at T11-12, where there is disc bulging and dorsal ligamentous thickening. No cord compression. Foraminal stenosis is also most notable at this level, related to endplate and facet spurs predominantly.  Critical Value/emergent results were called by telephone at the time of interpretation on 08/30/2013 at  3:14 PM to Dr. Elmer Sow , who verbally acknowledged these results.  IMPRESSION: 1. Nonhemorrhagic spinal cord contusion at C3-4, where there is  advanced degenerative canal stenosis. 2. No evidence of occult cervical or thoracic fracture. 3. 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 4. Advanced cervical and thoracic degenerative disc and facet disease, as above.   Electronically Signed   By: Jorje Guild M.D.   On: 08/30/2013 15:19   Dg Chest Port 1 View  08/30/2013   CLINICAL DATA:  Status post fall.  Facial lacerations.  EXAM: PORTABLE CHEST - 1 VIEW  COMPARISON:  PA and lateral chest 06/22/2011.  FINDINGS: Lungs are clear. Heart size is normal. No pneumothorax or pleural effusion. No focal bony abnormality. Degenerative change about the shoulders noted.  IMPRESSION: No acute disease.   Electronically Signed   By: Inge Rise M.D.   On: 08/30/2013 10:49   Dg Knee Right Port  08/30/2013   CLINICAL DATA:  Fall.  Abrasions along the knee.  EXAM: PORTABLE RIGHT KNEE - 1-2 VIEW  COMPARISON:  None.  FINDINGS: Severe osteoarthritis. Distal SFA and popliteal artery atherosclerosis. Trace knee effusion. The severe loss of medial compartmental articular space.  No discrete fracture observed.  IMPRESSION: 1. No discrete fracture is observed on this two view series. 2. Severe osteoarthritis. The severity of spurring causes cortical irregularity which can reduce sensitivity for subtle fractures. 3. Trace knee effusion. 4. Atherosclerosis.   Electronically Signed   By: Sherryl Barters M.D.   On: 08/30/2013 10:38    Anti-infectives: Anti-infectives   None      Assessment/Plan: SLF Central cord syndrome Facial abrasions  Per Dr. Vertell Limber - PT/OT surgery next week Local care to fascial abrasions   LOS: 1 day    Rosario Jacks., [facility] 08/31/2013

## 2013-09-01 LAB — GLUCOSE, CAPILLARY
Glucose-Capillary: 281 mg/dL — ABNORMAL HIGH (ref 70–99)
Glucose-Capillary: 280 mg/dL — ABNORMAL HIGH (ref 70–99)
Glucose-Capillary: 274 mg/dL — ABNORMAL HIGH (ref 70–99)

## 2013-09-01 MED ORDER — INSULIN ASPART 100 UNIT/ML ~~LOC~~ SOLN: 0.0000 [IU] | SUBCUTANEOUS | Status: DC

## 2013-09-01 MED ORDER — INSULIN ASPART 100 UNIT/ML ~~LOC~~ SOLN
0.0000 [IU] | Freq: Three times a day (TID) | SUBCUTANEOUS | Status: DC
  Administered 2013-09-01 – 2013-09-02 (×3): 8 [IU] via SUBCUTANEOUS

## 2013-09-01 NOTE — Progress Notes (Signed)
Patient ID: John Carter, male   DOB: 1930/02/09, 79 y.o.   MRN: [id number] Afeb, vss No new neuro issues Still with severe quadraparesis. Spoke again about possible game plan regarding surgery once plavix is worn off That will be between them and Dr Vertell Limber in the near future.

## 2013-09-01 NOTE — Progress Notes (Signed)
Subjective: No c/o. No issues overnight. No improvement in motor function  Objective: Vital signs in last 24 hours: Temp:  [99 F (37.2 C)-100.9 F (38.3 C)] 99 F (37.2 C) (02/14 0800) Pulse Rate:  [64-101] 88 (02/14 0800) Resp:  [11-21] 15 (02/14 0800) BP: (122-155)/(44-68) 141/65 mmHg (02/14 0800) SpO2:  [95 %-99 %] 95 % (02/14 0800) Last BM Date: 08/29/12  Intake/Output from previous day: 02/13 0701 - 02/14 0700 In: 2140 [P.O.:340; I.V.:1800] Out: 4050 [Urine:4050] Intake/Output this shift: Total I/O In: 435 [P.O.:360; I.V.:75] Out: -   Alert, nad, ox3 c collar in place. Bruising on face cta b/l Reg Soft, nt, nd No edema No movement b/l UE Some spont mvmt in b/l LE  Lab Results:   Recent Labs  08/30/13 1025 08/31/13 0325  WBC 9.4 11.0*  HGB 10.7* 10.1*  HCT 31.8* 29.8*  PLT 57* 58*   BMET  Recent Labs  08/30/13 1025 08/31/13 0325  NA 141 137  K 3.5* 3.4*  CL 104 98  CO2 23 23  GLUCOSE 218* 174*  BUN 17 12  CREATININE 0.98 0.98  CALCIUM 7.4* 7.3*   PT/INR  Recent Labs  08/30/13 1025  LABPROT 12.9  INR 0.99   ABG No results found for this basename: PHART, PCO2, PO2, HCO3,  in the last 72 hours  Studies/Results: Ct Head Wo Contrast  08/30/2013   CLINICAL DATA:  Fall onto face.  Unable to move arms.  EXAM: CT HEAD WITHOUT CONTRAST  CT CERVICAL SPINE WITHOUT CONTRAST  TECHNIQUE: Multidetector CT imaging of the head and cervical spine was performed following the standard protocol without intravenous contrast. Multiplanar CT image reconstructions of the cervical spine were also generated.  COMPARISON:  DG BONE SURVEY MET dated 12/30/2011; MR HEAD W/O CM dated 09/03/2005; CT HEAD W/O CM dated 08/26/2005  FINDINGS: CT HEAD FINDINGS  The brainstem, cerebellum, cerebral peduncles, thalamus, basal ganglia, basilar cisterns, and ventricular system appear within normal limits. Periventricular white matter and corona radiata hypodensities favor chronic  ischemic microvascular white matter disease. No intracranial hemorrhage, mass lesion, or acute CVA.  Right frontal forehead scalp soft tissue swelling. Mild chronic left maxillary sinusitis. Diffuse chronic ethmoid sinusitis with mucosal thickening in the nasal cavity.  Irregularity of the head bilateral nasal bones, increased from 08/26/2005, and suspicious for nasal bone fractures. There is dense calcification along the transverse ligament at C1-2. There is atherosclerotic calcification of the cavernous carotid arteries bilaterally.  CT CERVICAL SPINE FINDINGS  The cervical spondylosis with interbody bridging spurring extending at all levels between C4 and T2. Uncinate and facet spurring causes osseous foraminal stenosis at a variety of levels but most strikingly at C2-3 and C3-4 bilaterally. Bony fusion across the facet joints is present on the left at C4-C5-C6 and on the right at C5-6. There is posterior osseous ridging at C3-4, C5-6, and C6-7. No fracture or acute subluxation observed. There is atherosclerotic calcification of the carotid bulbs.  Left thyroid lesion, 1.5 x 0.7 cm, hypodense, without internal calcifications.  IMPRESSION: 1. Chronic ethmoid and left maxillary sinusitis with mucosal thickening in the nasal cavity. 2. Suspected bilateral nasal fractures, relatively nondisplaced. 3. Atherosclerosis. 4. No acute intracranial findings. 5. Periventricular white matter and corona radiata hypodensities favor chronic ischemic microvascular white matter disease. 6. Extensive cervical spondylosis with multilevel bridging intervertebral and facet spurring, and spurring causing multilevel foraminal impingement. No acute cervical spine fracture or acute subluxation observed.   Electronically Signed   By: Sherryl Barters M.D.  On: 08/30/2013 10:59   Ct Cervical Spine Wo Contrast  08/30/2013   CLINICAL DATA:  Fall onto face.  Unable to move arms.  EXAM: CT HEAD WITHOUT CONTRAST  CT CERVICAL SPINE WITHOUT  CONTRAST  TECHNIQUE: Multidetector CT imaging of the head and cervical spine was performed following the standard protocol without intravenous contrast. Multiplanar CT image reconstructions of the cervical spine were also generated.  COMPARISON:  DG BONE SURVEY MET dated 12/30/2011; MR HEAD W/O CM dated 09/03/2005; CT HEAD W/O CM dated 08/26/2005  FINDINGS: CT HEAD FINDINGS  The brainstem, cerebellum, cerebral peduncles, thalamus, basal ganglia, basilar cisterns, and ventricular system appear within normal limits. Periventricular white matter and corona radiata hypodensities favor chronic ischemic microvascular white matter disease. No intracranial hemorrhage, mass lesion, or acute CVA.  Right frontal forehead scalp soft tissue swelling. Mild chronic left maxillary sinusitis. Diffuse chronic ethmoid sinusitis with mucosal thickening in the nasal cavity.  Irregularity of the head bilateral nasal bones, increased from 08/26/2005, and suspicious for nasal bone fractures. There is dense calcification along the transverse ligament at C1-2. There is atherosclerotic calcification of the cavernous carotid arteries bilaterally.  CT CERVICAL SPINE FINDINGS  The cervical spondylosis with interbody bridging spurring extending at all levels between C4 and T2. Uncinate and facet spurring causes osseous foraminal stenosis at a variety of levels but most strikingly at C2-3 and C3-4 bilaterally. Bony fusion across the facet joints is present on the left at C4-C5-C6 and on the right at C5-6. There is posterior osseous ridging at C3-4, C5-6, and C6-7. No fracture or acute subluxation observed. There is atherosclerotic calcification of the carotid bulbs.  Left thyroid lesion, 1.5 x 0.7 cm, hypodense, without internal calcifications.  IMPRESSION: 1. Chronic ethmoid and left maxillary sinusitis with mucosal thickening in the nasal cavity. 2. Suspected bilateral nasal fractures, relatively nondisplaced. 3. Atherosclerosis. 4. No acute  intracranial findings. 5. Periventricular white matter and corona radiata hypodensities favor chronic ischemic microvascular white matter disease. 6. Extensive cervical spondylosis with multilevel bridging intervertebral and facet spurring, and spurring causing multilevel foraminal impingement. No acute cervical spine fracture or acute subluxation observed.   Electronically Signed   By: Sherryl Barters M.D.   On: 08/30/2013 10:59   Mr Cervical Spine Wo Contrast  08/30/2013   CLINICAL DATA:  Bilateral upper extremity numbness after fall.  EXAM: MRI CERVICAL SPINE WITHOUT CONTRAST; MRI THORACIC SPINE WITHOUT CONTRAST  TECHNIQUE: Multiplanar, multisequence MR imaging of the cervical and thoracic spine was performed. No intravenous contrast was administered.  COMPARISON:  Cervical spine CT 08/30/2013  FINDINGS: Cervical spine: There is no marrow signal abnormality suggestive of occult fracture. Effusions present in the C3-4 facet joints bilaterally, likely chronic based on advanced degenerative changes and lack of disc edema. No definitive perivertebral edema. There is advanced spinal canal stenosis at C3-4, secondary to broad disc bulge and posterior ligamentous buckling and thickening. There is circumferential compression of the spinal cord, which has subtle T2 hyper intensity localized at the segment. No evidence of cord hemorrhage.  Incidental thyroid nodule in the posterior left lobe, no specific further evaluation suggested based on patient age.  C1-2: Degenerative ligamentous thickening around the dens.  C2-3: Advanced facet osteoarthritis with spurring causing bilateral foraminal stenosis. No significant canal stenosis.  C3-4: Advanced spinal canal stenosis as above, secondary to both disc bulging and posterior ligamentous buckling. Low T1 signal within the posterior elements bilaterally is likely from the degenerative sclerosis noted on the preceding CT. There is advanced bilateral  foraminal stenosis.   C4-5: Interbody fusion by thin osteophytes or syndesmophytes. Facet osteoarthritis with bony overgrowth crowding the foramina.  C5-6: Advanced facet osteoarthritis with facet ankylosis. No advanced canal or foraminal stenosis.  C6-7: Interbody fusion. Advanced facet osteoarthritis with mild foraminal crowding.  Thoracic spine: No marrow signal abnormality suggestive of fracture. The marrow is heterogeneous diffusely, without focal mass like lesion. Remote compression fracture of L1, with minimal height loss. Normal cord signal and morphology. There is diffuse degenerative disc and facet disease. Spinal canal stenosis is most notable at T11-12, where there is disc bulging and dorsal ligamentous thickening. No cord compression. Foraminal stenosis is also most notable at this level, related to endplate and facet spurs predominantly.  Critical Value/emergent results were called by telephone at the time of interpretation on 08/30/2013 at 3:14 PM to Dr. Elmer Sow , who verbally acknowledged these results.  IMPRESSION: 1. Nonhemorrhagic spinal cord contusion at C3-4, where there is advanced degenerative canal stenosis. 2. No evidence of occult cervical or thoracic fracture. 3. 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 4. Advanced cervical and thoracic degenerative disc and facet disease, as above.   Electronically Signed   By: Jorje Guild M.D.   On: 08/30/2013 15:19   Mr Thoracic Spine Wo Contrast  08/30/2013   CLINICAL DATA:  Bilateral upper extremity numbness after fall.  EXAM: MRI CERVICAL SPINE WITHOUT CONTRAST; MRI THORACIC SPINE WITHOUT CONTRAST  TECHNIQUE: Multiplanar, multisequence MR imaging of the cervical and thoracic spine was performed. No intravenous contrast was administered.  COMPARISON:  Cervical spine CT 08/30/2013  FINDINGS: Cervical spine: There is no marrow signal abnormality suggestive of occult fracture. Effusions present in the C3-4 facet  joints bilaterally, likely chronic based on advanced degenerative changes and lack of disc edema. No definitive perivertebral edema. There is advanced spinal canal stenosis at C3-4, secondary to broad disc bulge and posterior ligamentous buckling and thickening. There is circumferential compression of the spinal cord, which has subtle T2 hyper intensity localized at the segment. No evidence of cord hemorrhage.  Incidental thyroid nodule in the posterior left lobe, no specific further evaluation suggested based on patient age.  C1-2: Degenerative ligamentous thickening around the dens.  C2-3: Advanced facet osteoarthritis with spurring causing bilateral foraminal stenosis. No significant canal stenosis.  C3-4: Advanced spinal canal stenosis as above, secondary to both disc bulging and posterior ligamentous buckling. Low T1 signal within the posterior elements bilaterally is likely from the degenerative sclerosis noted on the preceding CT. There is advanced bilateral foraminal stenosis.  C4-5: Interbody fusion by thin osteophytes or syndesmophytes. Facet osteoarthritis with bony overgrowth crowding the foramina.  C5-6: Advanced facet osteoarthritis with facet ankylosis. No advanced canal or foraminal stenosis.  C6-7: Interbody fusion. Advanced facet osteoarthritis with mild foraminal crowding.  Thoracic spine: No marrow signal abnormality suggestive of fracture. The marrow is heterogeneous diffusely, without focal mass like lesion. Remote compression fracture of L1, with minimal height loss. Normal cord signal and morphology. There is diffuse degenerative disc and facet disease. Spinal canal stenosis is most notable at T11-12, where there is disc bulging and dorsal ligamentous thickening. No cord compression. Foraminal stenosis is also most notable at this level, related to endplate and facet spurs predominantly.  Critical Value/emergent results were called by telephone at the time of interpretation on 08/30/2013 at  3:14 PM to Dr. Elmer Sow , who verbally acknowledged these results.  IMPRESSION: 1. Nonhemorrhagic spinal cord contusion at C3-4, where there is  advanced degenerative canal stenosis. 2. No evidence of occult cervical or thoracic fracture. 3. 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 4. Advanced cervical and thoracic degenerative disc and facet disease, as above.   Electronically Signed   By: Jorje Guild M.D.   On: 08/30/2013 15:19   Dg Chest Port 1 View  08/30/2013   CLINICAL DATA:  Status post fall.  Facial lacerations.  EXAM: PORTABLE CHEST - 1 VIEW  COMPARISON:  PA and lateral chest 06/22/2011.  FINDINGS: Lungs are clear. Heart size is normal. No pneumothorax or pleural effusion. No focal bony abnormality. Degenerative change about the shoulders noted.  IMPRESSION: No acute disease.   Electronically Signed   By: Inge Rise M.D.   On: 08/30/2013 10:49   Dg Knee Right Port  08/30/2013   CLINICAL DATA:  Fall.  Abrasions along the knee.  EXAM: PORTABLE RIGHT KNEE - 1-2 VIEW  COMPARISON:  None.  FINDINGS: Severe osteoarthritis. Distal SFA and popliteal artery atherosclerosis. Trace knee effusion. The severe loss of medial compartmental articular space.  No discrete fracture observed.  IMPRESSION: 1. No discrete fracture is observed on this two view series. 2. Severe osteoarthritis. The severity of spurring causes cortical irregularity which can reduce sensitivity for subtle fractures. 3. Trace knee effusion. 4. Atherosclerosis.   Electronically Signed   By: Sherryl Barters M.D.   On: 08/30/2013 10:38    Anti-infectives: Anti-infectives   None      Assessment/Plan: Principal Problem:  Central cord syndrome - stable. Await plavix to wear off, NSG considering surgery .  Active Problems:  Multiple myeloma  Hypertension - BP is adequately controlled.  Hyperlipidemia  Coronary artery disease - stable. He would be able to tolerate surgery  from a CV point of view.  GERD (gastroesophageal reflux disease)  Thrombocytopenia  Anemia  Fall  Fever - temp 101 yesterday am, now AF. Will monitor  F/E/N - tolerating diet, KVO ivf Endo - on steriods. Will check blood sugars and cover with [facility] PT/OT consult - activity as tolerated   Leighton Ruff. Redmond Pulling, MD, FACS General, Bariatric, & Minimally Invasive Surgery [facility], [place]    LOS: 2 days    Gayland Curry 09/01/2013

## 2013-09-01 NOTE — Progress Notes (Signed)
PT Cancellation Note  Patient Details Name: John Carter MRN: [id number] DOB: [birth year]   Cancelled Treatment:    Reason Eval/Treat Not Completed: Medical issues which prohibited therapy;Patient not medically ready. Noted orders from trauma for OOB activities. Will await for clarification for clearance from neuro.     Elie Confer [place], [place] 09/01/2013, 1:24 PM

## 2013-09-01 NOTE — Progress Notes (Signed)
Nothing to add from medical point of view.   Fever seems to have abated some. No clear etiology noted.   BP, pulse fine. No change in BP meds.   He is in the midst of a 21 day course of his Revlimid for his myeloma. Guess we will continue it. Don't see a reason right now to stop it.

## 2013-09-01 NOTE — Progress Notes (Signed)
PT Cancellation Note  Patient Details Name: John Carter MRN: [id number] DOB: 1930-06-19   Cancelled Treatment:    Reason Eval/Treat Not Completed: Medical issues which prohibited therapy;Patient not medically ready. Pt on bedrest at this time. Will re-attempt to see pt when medically ready. Pt planned for surgery next week. Need to check with MD for activity orders.    Elie Confer [place], [place] 09/01/2013, 8:46 AM

## 2013-09-01 NOTE — Progress Notes (Signed)
OT Cancellation Note  Patient Details Name: John Carter MRN: [id number] DOB: May 14, 1930   Cancelled Treatment:    Reason Eval/Treat Not Completed: Medical issues which prohibited therapy. Pt on bedrest.  Almon Register [phone] 09/01/2013, 9:23 AM

## 2013-09-02 DIAGNOSIS — E785 Hyperlipidemia, unspecified: Secondary | ICD-10-CM

## 2013-09-02 DIAGNOSIS — I10 Essential (primary) hypertension: Secondary | ICD-10-CM

## 2013-09-02 LAB — GLUCOSE, CAPILLARY
GLUCOSE-CAPILLARY: 221 mg/dL — AB (ref 70–99)
Glucose-Capillary: 267 mg/dL — ABNORMAL HIGH (ref 70–99)
Glucose-Capillary: 253 mg/dL — ABNORMAL HIGH (ref 70–99)
Glucose-Capillary: 241 mg/dL — ABNORMAL HIGH (ref 70–99)

## 2013-09-02 MED ORDER — INSULIN ASPART 100 UNIT/ML ~~LOC~~ SOLN
0.0000 [IU] | Freq: Three times a day (TID) | SUBCUTANEOUS | Status: DC
  Administered 2013-09-02: 8 [IU] via SUBCUTANEOUS
  Administered 2013-09-02 – 2013-09-03 (×4): 5 [IU] via SUBCUTANEOUS
  Administered 2013-09-03: 8 [IU] via SUBCUTANEOUS
  Administered 2013-09-03 – 2013-09-04 (×2): 5 [IU] via SUBCUTANEOUS

## 2013-09-02 MED ORDER — INSULIN GLARGINE 100 UNIT/ML ~~LOC~~ SOLN
10.0000 [IU] | Freq: Every day | SUBCUTANEOUS | Status: DC
  Administered 2013-09-02: 10 [IU] via SUBCUTANEOUS
  Filled 2013-09-02 (×2): qty 0.1

## 2013-09-02 NOTE — Progress Notes (Signed)
Patient ID: John Carter, male   DOB: 12-19-1929, 80 y.o.   MRN: [id number] Afeb, vss No new neuro issues. Still with the central cord syndrome. Plan per Dr Vertell Limber re surgery.

## 2013-09-02 NOTE — Progress Notes (Signed)
  Subjective: Pt with no acute changes.  Objective: Vital signs in last 24 hours: Temp:  [98.8 F (37.1 C)-100 F (37.8 C)] 100 F (37.8 C) (02/15 1200) Pulse Rate:  [63-95] 75 (02/15 1200) Resp:  [11-20] 15 (02/15 1200) BP: (126-161)/(48-71) 132/49 mmHg (02/15 1200) SpO2:  [93 %-97 %] 94 % (02/15 1200) Last BM Date: 08/29/13  Intake/Output from previous day: 02/14 0701 - 02/15 0700 In: 2121.5 [P.O.:1680; I.V.:441.5] Out: 2575 [Urine:2575] Intake/Output this shift: Total I/O In: 390 [P.O.:360; I.V.:30] Out: 560 [Urine:560]  General appearance: alert and cooperative Cardio: regular rate and rhythm, S1, S2 normal, no murmur, click, rub or gallop Neurologic: Motor: Decreased UE and LE motor  Lab Results:   Recent Labs  08/31/13 0325  WBC 11.0*  HGB 10.1*  HCT 29.8*  PLT 58*   BMET  Recent Labs  08/31/13 0325  NA 137  K 3.4*  CL 98  CO2 23  GLUCOSE 174*  BUN 12  CREATININE 0.98  CALCIUM 7.3*   PT/INR No results found for this basename: LABPROT, INR,  in the last 72 hours ABG No results found for this basename: PHART, PCO2, PO2, HCO3,  in the last 72 hours  Studies/Results: No results found.  Anti-infectives: Anti-infectives   None      Assessment/Plan: Central cord syndrome - stable. Await plavix to wear off, NSG considering surgery   Active Problems:  Multiple myeloma  Hypertension - BP is adequately controlled.  Hyperlipidemia  Coronary artery disease - stable. He would be able to tolerate surgery from a CV point of view.  GERD (gastroesophageal reflux disease)  Thrombocytopenia  Anemia  Fall    F/E/N - tolerating diet, KVO ivf Endo - on steriods. Will check blood sugars and cover with [facility] PT/OT consult - activity as tolerated    LOS: 3 days    Rosario Jacks., [facility] 09/02/2013

## 2013-09-02 NOTE — Progress Notes (Signed)
Moving his left leg some on his on volition.   Sugars are quite high. He has a history of impaired fasting glucose (not DM) but is now on high doses of Decadron. Will add Lantus and add hs sliding scale.   BP is fine.   Nothing else to add from my point of view.

## 2013-09-03 ENCOUNTER — Other Ambulatory Visit: Payer: Self-pay | Admitting: Neurosurgery

## 2013-09-03 DIAGNOSIS — I2510 Atherosclerotic heart disease of native coronary artery without angina pectoris: Secondary | ICD-10-CM

## 2013-09-03 LAB — GLUCOSE, CAPILLARY
GLUCOSE-CAPILLARY: 215 mg/dL — AB (ref 70–99)
Glucose-Capillary: 268 mg/dL — ABNORMAL HIGH (ref 70–99)
Glucose-Capillary: 248 mg/dL — ABNORMAL HIGH (ref 70–99)
Glucose-Capillary: 222 mg/dL — ABNORMAL HIGH (ref 70–99)

## 2013-09-03 MED ORDER — POLYETHYLENE GLYCOL 3350 17 G PO PACK
17.0000 g | PACK | Freq: Every day | ORAL | Status: DC
  Administered 2013-09-03 – 2013-09-05 (×2): 17 g via ORAL
  Filled 2013-09-03 (×3): qty 1

## 2013-09-03 MED ORDER — INSULIN GLARGINE 100 UNIT/ML ~~LOC~~ SOLN
15.0000 [IU] | Freq: Every day | SUBCUTANEOUS | Status: DC
  Administered 2013-09-03: 15 [IU] via SUBCUTANEOUS
  Filled 2013-09-03 (×2): qty 0.15

## 2013-09-03 MED ORDER — BISACODYL 10 MG RE SUPP
10.0000 mg | Freq: Every day | RECTAL | Status: DC
  Filled 2013-09-03: qty 1

## 2013-09-03 MED ORDER — TIZANIDINE HCL 4 MG PO TABS
4.0000 mg | ORAL_TABLET | Freq: Three times a day (TID) | ORAL | Status: DC | PRN
  Filled 2013-09-03: qty 1

## 2013-09-03 NOTE — Progress Notes (Signed)
Sugars are still quite high. I will to ahead and increase Lantus this morning.   BP has been fine.   Temp still up slightly at times. No real evidence of infection.   Nothing else to add from medical standpoint.

## 2013-09-03 NOTE — Clinical Social Work Note (Signed)
Clinical Social Work Department BRIEF PSYCHOSOCIAL ASSESSMENT 09/03/2013  Patient:  John Carter, John Carter     Account Number:  [id number]     Admit date:  08/30/2013  Clinical Social Worker:  Myles Lipps  Date/Time:  09/03/2013 11:00 AM  Referred by:  Physician  Date Referred:  09/03/2013 Referred for  Crisis Intervention   Other Referral:   Interview type:  Patient Other interview type:   Patient wife at bedside    PSYCHOSOCIAL DATA Living Status:  WIFE Admitted from facility:   Level of care:   Primary support name:  Cassius, [place]  [phone] Primary support relationship to patient:  SPOUSE Degree of support available:   Strong    CURRENT CONCERNS Current Concerns  Adjustment to Illness  Post-Acute Placement   Other Concerns:   Patient awaiting surgery and possible CIR post surgery    SOCIAL WORK ASSESSMENT / PLAN Clinical Social Worker met with patient and patient wife at bedside to offer support and discuss patient needs at discharge.  Patient states that he was outside walking the dog when he tripped and fell face first to the cement driveway.  Patient neighbor found patient down and called 911 and then went inside to notify patient wife.  Patient currently lives at home with his wife in a one story home and has 3 and 6 inch thresholds at the entrance ways of the house.  Patient wife states that she is available to provide supervision 24 hours at discharge but is limited physcially.  Patient wife does feel that friends will be able to assist her once patient returns home.  Patient is hopeful for inpatient rehab placement following surgery - will await consult after surgery.    Clinical Social Worker inquired about current substance use.  Patient states that he was not drinking alcohol or using drugs at the time of his fall and has had a bottle of liquor unopened in his cabinet for the last 30 years. Patient does not have concerns regarding any current use. SBIRT  complete and no resources needed at this time.  CSW remains available for support and to assist with facilitating patient discharge needs as deemed appropriate.   Assessment/plan status:  Psychosocial Support/Ongoing Assessment of Needs Other assessment/ plan:   Information/referral to community resources:   Patient and patient wife open to all available resources following patient hospitalization.  CSW will provide necessary resources as deemed appropriate.    PATIENT'S/FAMILY'S RESPONSE TO PLAN OF CARE: Patient alert and oriented x3 laying flat in the bed. Patient wife at bedside and offers patient a great deal of support.  Patient and patient wife have no children but do have several friends available to support.  Patient and patient wife willing to make accommodations to the home as needed for patient to be able to return.  Patient and patient wife understanding of social work role and appreciative of support and concern.

## 2013-09-03 NOTE — Progress Notes (Signed)
Patient ID: John Carter, male   DOB: January 16, 1930, 80 y.o.   MRN: [id number]    Subjective: Feels better after 2BM yesterday following supp  Objective: Vital signs in last 24 hours: Temp:  [99 F (37.2 C)-100.4 F (38 C)] 99 F (37.2 C) (02/16 0600) Pulse Rate:  [60-94] 60 (02/16 0600) Resp:  [12-22] 14 (02/16 0600) BP: (123-139)/(43-75) 139/75 mmHg (02/16 0600) SpO2:  [93 %-97 %] 97 % (02/16 0600) Last BM Date: 08/29/13  Intake/Output from previous day: 02/15 0701 - 02/16 0700 In: 1080 [P.O.:840; I.V.:240] Out: 2410 [Urine:2410] Intake/Output this shift:    General appearance: alert and cooperative Head: facial abrasions and contusions Resp: clear to auscultation bilaterally Cardio: regular rate and rhythm GI: soft, NT, ND, +BS Neuro: RUE shrug bi tri and grip 1/5, LUE shrug 1/5 bi and tri 2/5 grip 1/5, BLE 4/5, chronic numbness RLE  Anti-infectives: Anti-infectives   None      Assessment/Plan: Fall  Central cord - complicated by AS, Dr. Vertell Limber following, possible surgery TBD, PT/OT, decadron IDDM - appreciate Dr. Maxwell Caul F/U, increased Lantus today, decadron complicates this FEN - tol PO, check labs AM, schedule Miralax and dulcolax supp HTN/CAD - Plavix held Thrombocytopenia - check CBC AM VTE - PAS, check with NS Dispo - ICU P NS F/U    LOS: 4 days    Georganna Skeans, MD, MPH, FACS Pager: [phone]  09/03/2013

## 2013-09-03 NOTE — Progress Notes (Signed)
Occupational Therapy Treatment Patient Details Name: TEDDRICK MALLARI MRN: [id number] DOB: 09-22-1929 Today's Date: 09/03/2013 Time: 5732-2025 OT Time Calculation (min): 15 min  OT Assessment / Plan / Recommendation  History of present illness Pt s/p fall on sidewalk now presenting with central cord syndrome. Pt with minimal UE function.   OT comments  Adapted to call bell for use with LLE. Nsg educated on proper placement.   Follow Up Recommendations  CIR;Supervision/Assistance - 24 hour    Barriers to Discharge  Other (comment)    Equipment Recommendations  3 in 1 bedside comode;Tub/shower bench;Wheelchair (measurements OT);Wheelchair cushion (measurements OT);Hospital bed    Recommendations for Other Services Rehab consult Pastoral services for support  Frequency Min 3X/week   Progress towards OT Goals Progress towards OT goals: Progressing toward goals  Plan Discharge plan remains appropriate    Precautions / Restrictions Precautions Precautions: Fall;Cervical Required Braces or Orthoses: Cervical Brace Cervical Brace: Hard collar Restrictions Weight Bearing Restrictions: No   Pertinent Vitals/Pain no apparent distress     ADL  Transfers/Ambulation Related to ADLs: +2 total A squat pivot ADL Comments: Call bell adapted to chair. Placed lateral to LLE. attaced to footrest. Pt able to hit with L LE. Educated nsg staff on proper placement  Educated wife on proper positioning of BUE to decrease dependent edema   OT Diagnosis: Generalized weakness;Paresis;Ataxia  OT Problem List: Decreased strength;Decreased range of motion;Decreased activity tolerance;Impaired balance (sitting and/or standing);Decreased coordination;Decreased safety awareness;Decreased knowledge of use of DME or AE;Decreased knowledge of precautions;Impaired sensation;Impaired tone;Impaired UE functional use;Pain;Increased edema OT Treatment Interventions: Self-care/ADL training;Therapeutic  exercise;Neuromuscular education;Energy conservation;DME and/or AE instruction;Therapeutic activities;Patient/family education;Balance training   OT Goals(current goals can now be found in the care plan section) Acute Rehab OT Goals Patient Stated Goal: to use my hands OT Goal Formulation: With patient Time For Goal Achievement: 09/17/13 Potential to Achieve Goals: Good ADL Goals Pt Will Perform Eating: sitting;with adaptive utensils;with set-up (drinking with AE) Additional ADL Goal #1: use adapted call bell with LE with 90% accuracy Additional ADL Goal #2: maintain sitting balance EOB with BUE support with min A for ADL Additional ADL Goal #3: Complete slide board transfer with mod A for ADL  Visit Information  Last OT Received On: 08/27/13 Assistance Needed: +2 History of Present Illness: Pt s/p fall on sidewalk now presenting with central cord syndrome. Pt with minimal UE function.    Subjective Data      Prior Functioning  Home Living Family/patient expects to be discharged to:: Inpatient rehab Living Arrangements: Spouse/significant other Additional Comments: pt was living at home with spouse. 1 story home, 1 STE. pt was managing farm equip/tractors/house Prior Function Level of Independence: Independent Communication Communication: No difficulties Dominant Hand: Right    Cognition  Cognition Arousal/Alertness: Awake/alert Behavior During Therapy: WFL for tasks assessed/performed Overall Cognitive Status: Within Functional Limits for tasks assessed    Mobility  Bed Mobility Overal bed mobility: Needs Assistance;+2 for physical assistance Bed Mobility: Rolling;Sidelying to Sit;Sit to Sidelying Rolling: +2 for physical assistance;Max assist Sidelying to sit: Max assist;+2 for physical assistance Sit to sidelying: Max assist;+2 for physical assistance General bed mobility comments: pt able to initiate LE mvmt, unable to use bilat UE functionally, dependent for trunk  management as well Transfers Overall transfer level: Needs assistance Equipment used: 2 person hand held assist (2 person lift with gait belt and bed pad) Transfers: Sit to/from [employer] Transfers Sit to Stand: +2 physical assistance;Total assist Squat pivot transfers: +2 physical  assistance;Total assist General transfer comment: B knees blocked    Exercises      Balance Balance Overall balance assessment: Needs assistance Sitting-balance support: Bilateral upper extremity supported;Feet supported Sitting balance-Leahy Scale: Zero Sitting balance - Comments: poor sense on midline. trunkal ataxia Standing balance-Leahy Scale: Zero General Comments General comments (skin integrity, edema, etc.): multiple bruises  - face  End of Session OT - End of Session Equipment Utilized During Treatment: Gait belt Activity Tolerance: Patient tolerated treatment well Patient left: in chair;with call bell/phone within reach;with family/visitor present Nurse Communication: Mobility status;Need for lift equipment  GO     Adi Doro,HILLARY 09/03/2013, 4:48 PM [facility], OTR/L  [phone] 09/03/2013

## 2013-09-03 NOTE — Progress Notes (Signed)
Occupational Therapy Evaluation Patient Details Name: John Carter MRN: [id number] DOB: 18-Nov-1929 Today's Date: 09/03/2013 Time: 9030-0923 OT Time Calculation (min): 42 min  OT Assessment / Plan / Recommendation History of present illness Pt s/p fall on sidewalk now presenting with central cord syndrome. Pt with minimal UE function.   Clinical Impression   PTA, pt independent with all ADL and mobility. Pt with significant functional decline with ADL and mobilty, requiring total A for ADL and total A +2 with mobility. Pt scheduled for surgery this Friday. Feel pt will be a solid CIR candidate after cervical fusion. Wife very supportive and pt very motivated. Will call facilities for soft touch call bell and adapt. Will begin to adapt cup with knuckle straw to increase independence with self feeding. Will follow    OT Assessment  Patient needs continued OT Services    Follow Up Recommendations  CIR;Supervision/Assistance - 24 hour    Barriers to Discharge Other (comment)    Equipment Recommendations  3 in 1 bedside comode;Tub/shower bench;Wheelchair (measurements OT);Wheelchair cushion (measurements OT);Hospital bed    Recommendations for Other Services Rehab consult  Frequency  Min 3X/week    Precautions / Restrictions Precautions Precautions: Fall;Cervical Required Braces or Orthoses: Cervical Brace Cervical Brace: Hard collar Restrictions Weight Bearing Restrictions: No   Pertinent Vitals/Pain stable    ADL  Transfers/Ambulation Related to ADLs: +2 total A squat pivot ADL Comments: total a with all ADL; nonfunctional  with BUE. will need AE    OT Diagnosis: Generalized weakness;Paresis;Ataxia  OT Problem List: Decreased strength;Decreased range of motion;Decreased activity tolerance;Impaired balance (sitting and/or standing);Decreased coordination;Decreased safety awareness;Decreased knowledge of use of DME or AE;Decreased knowledge of precautions;Impaired  sensation;Impaired tone;Impaired UE functional use;Pain;Increased edema OT Treatment Interventions: Self-care/ADL training;Therapeutic exercise;Neuromuscular education;Energy conservation;DME and/or AE instruction;Therapeutic activities;Patient/family education;Balance training   OT Goals(Current goals can be found in the care plan section) Acute Rehab OT Goals Patient Stated Goal: to use my hands OT Goal Formulation: With patient Time For Goal Achievement: 09/17/13 Potential to Achieve Goals: Good  Visit Information  Last OT Received On: 09/03/13 Assistance Needed: +2 History of Present Illness: Pt s/p fall on sidewalk now presenting with central cord syndrome. Pt with minimal UE function.       Prior [place] expects to be discharged to:: Inpatient rehab Living Arrangements: Spouse/significant other Additional Comments: pt was living at home with spouse. 1 story home, 1 STE. pt was managing farm equip/tractors/house Prior Function Level of Independence: Independent Communication Communication: No difficulties Dominant Hand: Right         Vision/Perception Vision - History Baseline Vision: No visual deficits   Cognition  Cognition Arousal/Alertness: Awake/alert Behavior During Therapy: WFL for tasks assessed/performed Overall Cognitive Status: Within Functional Limits for tasks assessed    Extremity/Trunk Assessment Upper Extremity Assessment Upper Extremity Assessment: RUE deficits/detail;LUE deficits/detail RUE Deficits / Details: shoulder shrug only. weak shoulder depression. no active elbow flex. trace ext RUE Sensation: decreased light touch (pt reports some numbness) RUE Coordination: decreased fine motor;decreased gross motor (nonfunctional hand) LUE Deficits / Details: shoulder shrug only. weak elbow extension with G eleinated. increased extensor tone LUE Sensation: decreased light touch (pt reports some numbness) LUE  Coordination: decreased fine motor;decreased gross motor (nonfunctional hand) Lower Extremity Assessment Lower Extremity Assessment: RLE deficits/detail;LLE deficits/detail RLE Deficits / Details: pt with weakness/numbness from previous injury but now further sensation impairment. pt now grossly 2+/5 with ataxic like mvmt RLE Sensation: decreased light touch;decreased proprioception.  BLE spasticity RLE Coordination: decreased fine motor;decreased gross motor LLE Deficits / Details: pt with some numbness and grossly 3-/5 with ataxic like mvmt LLE Sensation: decreased light touch;decreased proprioception (BLE ataxia) LLE Coordination: decreased fine motor;decreased gross motor Cervical / Trunk Assessment Cervical / Trunk Assessment: Other exceptions (pt in c-collar) Cervical / Trunk Exceptions: poor midline posture. posterior lean. unable to achieve midline upright posture independently     Mobility Bed Mobility Overal bed mobility: Needs Assistance;+2 for physical assistance Bed Mobility: Rolling;Sidelying to Sit;Sit to Sidelying Rolling: +2 for physical assistance;Max assist Sidelying to sit: Max assist;+2 for physical assistance Sit to sidelying: Max assist;+2 for physical assistance General bed mobility comments: pt able to initiate LE mvmt, unable to use bilat UE functionally, dependent for trunk management as well Transfers Overall transfer level: Needs assistance Equipment used: 2 person hand held assist (2 person lift with gait belt and bed pad) Transfers: Sit to/from [employer] Transfers Sit to Stand: +2 physical assistance;Total assist Squat pivot transfers: +2 physical assistance;Total assist General transfer comment: B knees blocked     Exercise     Balance Balance Overall balance assessment: Needs assistance Sitting-balance support: Bilateral upper extremity supported;Feet supported Sitting balance-Leahy Scale: Zero Sitting balance - Comments: poor sense on  midline. trunkal ataxia Standing balance-Leahy Scale: Zero General Comments General comments (skin integrity, edema, etc.): multiple bruises  - face   End of Session OT - End of Session Equipment Utilized During Treatment: Gait belt Activity Tolerance: Patient tolerated treatment well Patient left: in chair;with call bell/phone within reach;with family/visitor present Nurse Communication: Mobility status;Need for lift equipment ([place])  GO     Arta Stump,HILLARY 09/03/2013, 4:37 PM [facility], OTR/L  [phone] 09/03/2013

## 2013-09-03 NOTE — Progress Notes (Signed)
Subjective: Patient reports "I can move my legs. 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  I just cant do much with my hands"  Objective: Vital signs in last 24 hours: Temp:  [99 F (37.2 C)-100.4 F (38 C)] 99.1 F (37.3 C) (02/16 0800) Pulse Rate:  [60-94] 69 (02/16 0800) Resp:  [12-22] 20 (02/16 0800) BP: (123-147)/(43-75) 147/64 mmHg (02/16 0800) SpO2:  [93 %-97 %] 97 % (02/16 0800)  Intake/Output from previous day: 02/15 0701 - 02/16 0700 In: 1080 [P.O.:840; I.V.:240] Out: 2410 [Urine:2410] Intake/Output this shift: Total I/O In: 10 [I.V.:10] Out: -   Alert, conversant. Wife at bedside. Pt denies pain. Lifts legs off bed, right weaker than left with plantar/dorsiflexion. BUE without movement - shoulders shrug with attemps.   Lab Results: No results found for this basename: WBC, HGB, HCT, PLT,  in the last 72 hours BMET No results found for this basename: NA, K, CL, CO2, GLUCOSE, BUN, CREATININE, CALCIUM,  in the last 72 hours  Studies/Results: No results found.  Assessment/Plan:   LOS: 4 days  Discussed with pt possibility of surgery for tx of cervical stenosis (posterior cervical decompression & fusion), possibly Friday after Plavix held sufficiently. Dr. Vertell Limber will discuss further.   [name] 09/03/2013, 8:55 AM

## 2013-09-03 NOTE — Evaluation (Signed)
Physical Therapy Evaluation Patient Details Name: John Carter MRN: [id number] DOB: [birth year] Today's Date: 09/03/2013 Time: 6720-9470 PT Time Calculation (min): 30 min  PT Assessment / Plan / Recommendation History of Present Illness  Pt s/p fall on sidewalk now presenting with central cord syndrome. Pt with minimal UE function.  Clinical Impression  Pt presenting with central cord syndrome with bilat UE more involved than bilat LEs. Per neurosurgery pt possibly going to OR this Friday to address the cervical stenosis however once medically stable pt an excellent candidate for CIR. Pt independent PTA with good home set up and support. Acute PT to con't to follow pt to progress mobility as able.    PT Assessment  Patient needs continued PT services    Follow Up Recommendations  CIR    Does the patient have the potential to tolerate intense rehabilitation      Barriers to Discharge        Equipment Recommendations   (TBD)    Recommendations for Other Services Rehab consult   Frequency Min 4X/week    Precautions / Restrictions Precautions Precautions: Fall;Cervical Required Braces or Orthoses: Cervical Brace Cervical Brace: Hard collar Restrictions Weight Bearing Restrictions: No   Pertinent Vitals/Pain Denies pain      Mobility  Bed Mobility Overal bed mobility: Needs Assistance;+2 for physical assistance Bed Mobility: Rolling;Sidelying to Sit;Sit to Sidelying Rolling: +2 for physical assistance;Max assist Sidelying to sit: Max assist;+2 for physical assistance Sit to sidelying: Max assist;+2 for physical assistance General bed mobility comments: pt able to initiate LE mvmt, unable to use bilat UE functionally, dependent for trunk management as well Transfers Overall transfer level: Needs assistance Equipment used:  (2 person lift with gait belt and bed pad) Transfers: Sit to/from Stand Sit to Stand: Max assist;+2 physical assistance General transfer  comment: attempted x 3, max support at bilat knees to prevent buckling and achieve terminal extension. pt shaky and unable to maintain back extension. Pt with + BM limiting ability to transfer to chair at this time    Exercises     PT Diagnosis: Difficulty walking;Acute pain (central cord)  PT Problem List: Decreased strength;Decreased activity tolerance;Decreased balance;Decreased mobility;Decreased coordination;Impaired sensation;Impaired tone PT Treatment Interventions: DME instruction;Gait training;Functional mobility training;Therapeutic activities;Therapeutic exercise;Balance training;Neuromuscular re-education     PT Goals(Current goals can be found in the care plan section) Acute Rehab PT Goals Patient Stated Goal: to walk PT Goal Formulation: With patient Time For Goal Achievement: 09/10/13 Potential to Achieve Goals: Good  Visit Information  Last PT Received On: 09/03/13 Assistance Needed: +2 History of Present Illness: Pt s/p fall on sidewalk now presenting with central cord syndrome. Pt with minimal UE function.       Prior [place] expects to be discharged to:: Inpatient rehab Living Arrangements: Spouse/significant other Additional Comments: pt was living at home with spouse. 1 story home, 1 STE. pt was managing farm equip/tractors/house Prior Function Level of Independence: Independent Communication Communication: No difficulties Dominant Hand: Right    Cognition  Cognition Arousal/Alertness: Awake/alert Behavior During Therapy: WFL for tasks assessed/performed Overall Cognitive Status: Within Functional Limits for tasks assessed    Extremity/Trunk Assessment Upper Extremity Assessment Upper Extremity Assessment: RUE deficits/detail;LUE deficits/detail RUE Deficits / Details: grossly 1/5 t/o UE, full PROM except shld flex/abd to 90 RUE Sensation:  (pt reports some numbness) LUE Deficits / Details: grossly 1/5 t/o UE, full PROM  except shld flex/abd to 90 LUE Sensation:  (pt reports some numbness) Lower Extremity  Assessment Lower Extremity Assessment: RLE deficits/detail;LLE deficits/detail RLE Deficits / Details: pt with weakness/numbness from previous injury but now further sensation impairment. pt now grossly 2+/5 with ataxic like mvmt LLE Deficits / Details: pt with some numbness and grossly 3-/5 with ataxic like mvmt Cervical / Trunk Assessment Cervical / Trunk Assessment:  (pt in c-collar)   Balance Balance Overall balance assessment: Needs assistance Sitting-balance support: Bilateral upper extremity supported;Feet supported Sitting balance-Leahy Scale: Zero Sitting balance - Comments: maxA to maintain upright posture, pt able to sense posterior lean but unable to self correct Postural control: Posterior lean Standing balance support: No upper extremity supported Standing balance-Leahy Scale: Zero Standing balance comment: tolerated 3 stands <5 sec each General Comments General comments (skin integrity, edema, etc.): pt with + BM upon PT arrival, dependent for hygiene. pt with another BM during PT tx but reports "I think i'm still going." RN alerted pt having BM  End of Session PT - End of Session Equipment Utilized During Treatment: Gait belt;Cervical collar Activity Tolerance: Patient tolerated treatment well Patient left: in bed;with call bell/phone within reach;with family/visitor present (soft call light placed behind head) Nurse Communication: Mobility status (pt having BM)  GP     John Carter John Carter 09/03/2013, 11:38 AM  Kittie Plater, PT, DPT Pager #: [phone] Office #: [phone]

## 2013-09-03 NOTE — Progress Notes (Signed)
Significant improvement in both legs and modest improvement in both arms.  Plan surgery this coming Friday.  OK to tx to floor today.

## 2013-09-03 NOTE — Progress Notes (Signed)
Patient was screened by Gerlean Ren for appropriateness for an Inpatient Acute Rehab consult.  At this time, we are recommending IP Rehab consult following his surgery, tentatively planned for this Friday.   Please order following surgery and when you feel appropriate.     [place] Admissions Coordinator Cell [phone] Office [phone]

## 2013-09-04 DIAGNOSIS — E119 Type 2 diabetes mellitus without complications: Secondary | ICD-10-CM

## 2013-09-04 LAB — BASIC METABOLIC PANEL
BUN: 31 mg/dL — ABNORMAL HIGH (ref 6–23)
CHLORIDE: 103 meq/L (ref 96–112)
CO2: 20 mEq/L (ref 19–32)
Calcium: 7 mg/dL — ABNORMAL LOW (ref 8.4–10.5)
Creatinine, Ser: 1.07 mg/dL (ref 0.50–1.35)
GFR calc non Af Amer: 63 mL/min — ABNORMAL LOW (ref >90)
GFR, EST AFRICAN AMERICAN: 73 mL/min — AB (ref >90)
Glucose, Bld: 261 mg/dL — ABNORMAL HIGH (ref 70–99)
Potassium: 3.5 mEq/L — ABNORMAL LOW (ref 3.7–5.3)
Sodium: 140 mEq/L (ref 137–147)

## 2013-09-04 LAB — GLUCOSE, CAPILLARY
GLUCOSE-CAPILLARY: 222 mg/dL — AB (ref 70–99)
GLUCOSE-CAPILLARY: 254 mg/dL — AB (ref 70–99)
Glucose-Capillary: 232 mg/dL — ABNORMAL HIGH (ref 70–99)
Glucose-Capillary: 329 mg/dL — ABNORMAL HIGH (ref 70–99)

## 2013-09-04 LAB — CBC
HEMATOCRIT: 32.6 % — AB (ref 39.0–52.0)
Hemoglobin: 11.4 g/dL — ABNORMAL LOW (ref 13.0–17.0)
MCH: 32.6 pg (ref 26.0–34.0)
MCHC: 35 g/dL (ref 30.0–36.0)
MCV: 93.1 fL (ref 78.0–100.0)
PLATELETS: 80 10*3/uL — AB (ref 150–400)
RBC: 3.5 MIL/uL — ABNORMAL LOW (ref 4.22–5.81)
RDW: 13.6 % (ref 11.5–15.5)
WBC: 12.6 10*3/uL — AB (ref 4.0–10.5)

## 2013-09-04 MED ORDER — INSULIN GLARGINE 100 UNIT/ML ~~LOC~~ SOLN
20.0000 [IU] | Freq: Two times a day (BID) | SUBCUTANEOUS | Status: DC
  Administered 2013-09-04: 20 [IU] via SUBCUTANEOUS
  Filled 2013-09-04 (×4): qty 0.2

## 2013-09-04 MED ORDER — INSULIN ASPART 100 UNIT/ML ~~LOC~~ SOLN
0.0000 [IU] | Freq: Three times a day (TID) | SUBCUTANEOUS | Status: DC
  Administered 2013-09-04: 7 [IU] via SUBCUTANEOUS
  Administered 2013-09-04 – 2013-09-05 (×3): 11 [IU] via SUBCUTANEOUS

## 2013-09-04 MED ORDER — INSULIN GLARGINE 100 UNIT/ML ~~LOC~~ SOLN
20.0000 [IU] | Freq: Every day | SUBCUTANEOUS | Status: DC
  Administered 2013-09-04: 20 [IU] via SUBCUTANEOUS
  Filled 2013-09-04: qty 0.2

## 2013-09-04 MED ORDER — INSULIN GLARGINE 100 UNIT/ML ~~LOC~~ SOLN: 25.0000 [IU] | Freq: Every day | SUBCUTANEOUS | Status: DC

## 2013-09-04 MED ORDER — POTASSIUM CHLORIDE CRYS ER 20 MEQ PO TBCR
20.0000 meq | EXTENDED_RELEASE_TABLET | Freq: Two times a day (BID) | ORAL | Status: DC
  Administered 2013-09-04 – 2013-09-05 (×3): 20 meq via ORAL
  Filled 2013-09-04 (×4): qty 1

## 2013-09-04 MED ORDER — TRAMADOL HCL 50 MG PO TABS: 50.0000 mg | ORAL_TABLET | Freq: Four times a day (QID) | ORAL | Status: DC | PRN

## 2013-09-04 MED ORDER — INSULIN GLARGINE 100 UNIT/ML ~~LOC~~ SOLN: 20.0000 [IU] | Freq: Every day | SUBCUTANEOUS | Status: DC

## 2013-09-04 NOTE — Clinical Social Work Placement (Addendum)
Clinical Social Work Department CLINICAL SOCIAL WORK PLACEMENT NOTE 09/04/2013  Patient:  John Carter, John Carter  Account Number:  [id number] Admit date:  08/30/2013  Clinical Social Worker:  Barbette Or, LCSW  Date/time:  09/04/2013 03:30 PM  Clinical Social Work is seeking post-discharge placement for this patient at the following level of care:   SKILLED NURSING   (*CSW will update this form in Epic as items are completed)   09/04/2013  Patient/family provided with [place] Department of Clinical Social Work's list of facilities offering this level of care within the geographic area requested by the patient (or if unable, by the patient's family).  09/04/2013  Patient/family informed of their freedom to choose among providers that offer the needed level of care, that participate in Medicare, Medicaid or managed care program needed by the patient, have an available bed and are willing to accept the patient.  09/04/2013  Patient/family informed of [facility]' ownership interest in [facility], as well as of the fact that they are under no obligation to receive care at this facility.  PASARR submitted to EDS on  Pre-existing PASARR number received from EDS on   FL2 transmitted to all facilities in geographic area requested by pt/family on  09/04/2013 FL2 transmitted to all facilities within larger geographic area on   Patient informed that his/her managed care company has contracts with or will negotiate with  certain facilities, including the following:     Patient/family informed of bed offers received:  09/05/2013 Patient chooses bed at  [facility] Physician recommends and patient chooses bed at    Patient to be transferred to [facility] on  09/05/2013   Patient to be transferred to facility by  [facility]  The following physician request were entered in Epic:   Additional Comments: 02/17 Patient would like [facility] or [facility]

## 2013-09-04 NOTE — Clinical Social Work Note (Signed)
Clinical Social Worker continuing to follow patient and family for support and discharge planning needs.  CSW spoke with patient at bedside and patient wife over the phone to further discuss patient discharge plans.  Per Dr. Vertell Limber, patient is not eligible for surgery until Tuesday 09/11/2013.  Patient will likely need SNF placement prior to surgery and potential inpatient rehab admission.  CSW has initiated search to [facility] and [facility] per patient request.  CSW to follow up with patient and patient wife regarding bed offers once available.  CSW remains available for support and to facilitate patient discharge needs once medically ready.  Barbette Or, [place]

## 2013-09-04 NOTE — Progress Notes (Signed)
Patient ID: John Carter, male   DOB: [birth year], 80 y.o.   MRN: [id number]   LOS: 5 days   Subjective: No c/o.    Objective: Vital signs in last 24 hours: Temp:  [97.5 F (36.4 C)-99.5 F (37.5 C)] 98.6 F (37 C) (02/17 0605) Pulse Rate:  [61-86] 61 (02/17 0605) Resp:  [18-22] 18 (02/17 0605) BP: (126-151)/(65-105) 149/73 mmHg (02/17 0605) SpO2:  [94 %-97 %] 94 % (02/17 0605) Last BM Date: 09/03/13   Laboratory  CBC  Recent Labs  09/04/13 0518  WBC 12.6*  HGB 11.4*  HCT 32.6*  PLT 80*   BMET  Recent Labs  09/04/13 0518  NA 140  K 3.5*  CL 103  CO2 20  GLUCOSE 261*  BUN 31*  CREATININE 1.07  CALCIUM 7.0*   CBG (last 3)   Recent Labs  09/03/13 1141 09/03/13 1647 09/03/13 2113  GLUCAP 248* 222* 215*    Physical Exam General appearance: alert and no distress Resp: clear to auscultation bilaterally Cardio: regular rate and rhythm GI: normal findings: bowel sounds normal and soft, non-tender Pulses: 2+ and symmetric   Assessment/Plan: Fall  Central cord - complicated by AS, Dr. Vertell Limber following, plans posterior fixation next Tuesday. PT/OT, will check with Dr. Vertell Limber on LoT of decadron  IDDM - appreciate Dr. Maxwell Caul F/U, increased SSI, Lantus today, decadron complicates this  HTN/CAD - Home meds except Plavix  Thrombocytopenia - Improved FEN - Supplement K+ VTE - SCD's Dispo - Plan on SNF placement while awaiting surgery    Lisette Abu, PA-C Pager: [phone] General Trauma PA Pager: [phone]   09/04/2013

## 2013-09-04 NOTE — Progress Notes (Signed)
Physical Therapy Treatment Patient Details Name: John Carter MRN: [id number] DOB: 20-Apr-1930 Today's Date: 09/04/2013 Time: 4098-1191 PT Time Calculation (min): 25 min  PT Assessment / Plan / Recommendation  History of Present Illness Pt s/p fall on sidewalk now presenting with central cord syndrome. Pt with minimal UE function.   PT Comments   Patient continues to be limited by weakness of LEs and inability to move UE and use functionally. Patients wife present throughout and asking questions regarding future surgery and placement for husband. Left a message for Barbette Or, CSW to speak with patient as she was able. Notified nursing on transfer back to bed with use of maximove. Continue with current POC  Follow Up Recommendations  CIR     Does the patient have the potential to tolerate intense rehabilitation     Barriers to Discharge        Equipment Recommendations       Recommendations for Other Services    Frequency Min 4X/week   Progress towards PT Goals Progress towards PT goals: Progressing toward goals  Plan Current plan remains appropriate    Precautions / Restrictions Precautions Precautions: Fall;Cervical Required Braces or Orthoses: Cervical Brace Cervical Brace: Hard collar   Pertinent Vitals/Pain Denied pain    Mobility  Bed Mobility Rolling: +2 for physical assistance;Max assist Sidelying to sit: Max assist;+2 for physical assistance General bed mobility comments: pt able to initiate LE mvmt but unable to control positioning of LEs, unable to use bilat UE functionally, dependent for trunk management as well Transfers Overall transfer level: Needs assistance Equipment used: 2 person hand held assist Sit to Stand: +2 physical assistance;Total assist Squat pivot transfers: +2 physical assistance;Total assist General transfer comment: A for all aspects. B LEs blocked with transfer. Patient very kyphotic    Exercises     PT Diagnosis:    PT  Problem List:   PT Treatment Interventions:     PT Goals (current goals can now be found in the care plan section)    Visit Information  Last PT Received On: 09/04/13 Assistance Needed: +2 History of Present Illness: Pt s/p fall on sidewalk now presenting with central cord syndrome. Pt with minimal UE function.    Subjective Data      Cognition  Cognition Arousal/Alertness: Awake/alert Behavior During Therapy: WFL for tasks assessed/performed Overall Cognitive Status: Within Functional Limits for tasks assessed    Balance  Balance Sitting balance-Leahy Scale: Zero Standing balance-Leahy Scale: Zero  End of Session PT - End of Session Equipment Utilized During Treatment: Gait belt;Cervical collar Activity Tolerance: Patient tolerated treatment well Patient left: in chair;with call bell/phone within reach Nurse Communication: Mobility status;Need for lift equipment   GP     Jacqualyn Posey 09/04/2013, 12:03 PM 09/04/2013 Jacqualyn Posey PTA [phone] pager [phone] office

## 2013-09-04 NOTE — Progress Notes (Signed)
Subjective: Patient reports "I'm doing alright"  Objective: Vital signs in last 24 hours: Temp:  [97.5 F (36.4 C)-99.5 F (37.5 C)] 98.6 F (37 C) (02/17 0605) Pulse Rate:  [61-86] 61 (02/17 0605) Resp:  [18-22] 18 (02/17 0605) BP: (126-151)/(64-105) 149/73 mmHg (02/17 0605) SpO2:  [94 %-97 %] 94 % (02/17 0605)  Intake/Output from previous day: 02/16 0701 - 02/17 0700 In: 570 [P.O.:540; I.V.:30] Out: 875 [Urine:875] Intake/Output this shift:    Awakens to voice. No c/o pain or discomfort. Exam without change. Pt aware & agreeable to plan for surgery Friday or Tuesday with CIR to follow.  Lab Results:  Recent Labs  09/04/13 0518  WBC 12.6*  HGB 11.4*  HCT 32.6*  PLT 80*   BMET  Recent Labs  09/04/13 0518  NA 140  K 3.5*  CL 103  CO2 20  GLUCOSE 261*  BUN 31*  CREATININE 1.07  CALCIUM 7.0*    Studies/Results: No results found.  Assessment/Plan:   LOS: 5 days  Continue PT/OT with plan for surgery Fri or Tues with CIR to follow.   [name] 09/04/2013, 7:15 AM   We will proceed with surgery on Tuesday as this will be sufficient time for plavix to be out of his system before decompressive surgery around cervical spinal cord.

## 2013-09-04 NOTE — Progress Notes (Signed)
Neuro exam similar to yesterday. Plan SNF this week. Dr. Vertell Limber to do surgery next Tuesday. Patient examined and I agree with the assessment and plan  Georganna Skeans, MD, MPH, FACS Pager: [phone]  09/04/2013 11:59 AM

## 2013-09-04 NOTE — Progress Notes (Addendum)
Sugars are still high. Will increase Lantus a bit more.   He stated that someone said he may have to go to SNF-rehab to await surgery. Needless to say, he is anxious about that given weather conditions, etc.   I note plan for surgery Friday or Tuesday.   Addendum: RN pointed out to me that surgery had increase Lantus to 20 Units, so I rescinded my order and will see what happens with 20 Units today.

## 2013-09-05 DIAGNOSIS — S022XXA Fracture of nasal bones, initial encounter for closed fracture: Secondary | ICD-10-CM | POA: Diagnosis present

## 2013-09-05 DIAGNOSIS — M4712 Other spondylosis with myelopathy, cervical region: Secondary | ICD-10-CM | POA: Diagnosis present

## 2013-09-05 LAB — CBC
HCT: 34.3 % — ABNORMAL LOW (ref 39.0–52.0)
Hemoglobin: 11.9 g/dL — ABNORMAL LOW (ref 13.0–17.0)
MCH: 32.2 pg (ref 26.0–34.0)
MCHC: 34.7 g/dL (ref 30.0–36.0)
MCV: 92.7 fL (ref 78.0–100.0)
Platelets: 79 K/uL — ABNORMAL LOW (ref 150–400)
RBC: 3.7 MIL/uL — ABNORMAL LOW (ref 4.22–5.81)
RDW: 13.6 % (ref 11.5–15.5)
WBC: 13.5 K/uL — ABNORMAL HIGH (ref 4.0–10.5)

## 2013-09-05 LAB — BASIC METABOLIC PANEL
BUN: 26 mg/dL — ABNORMAL HIGH (ref 6–23)
CALCIUM: 7 mg/dL — AB (ref 8.4–10.5)
CHLORIDE: 102 meq/L (ref 96–112)
CO2: 16 mEq/L — ABNORMAL LOW (ref 19–32)
CREATININE: 0.88 mg/dL (ref 0.50–1.35)
GFR calc Af Amer: >90 mL/min (ref >90)
GFR calc non Af Amer: 78 mL/min — ABNORMAL LOW (ref >90)
Glucose, Bld: 288 mg/dL — ABNORMAL HIGH (ref 70–99)
Potassium: 4.6 mEq/L (ref 3.7–5.3)
SODIUM: 133 meq/L — AB (ref 137–147)

## 2013-09-05 LAB — GLUCOSE, CAPILLARY
Glucose-Capillary: 268 mg/dL — ABNORMAL HIGH (ref 70–99)
Glucose-Capillary: 243 mg/dL — ABNORMAL HIGH (ref 70–99)

## 2013-09-05 MED ORDER — DEXAMETHASONE 4 MG PO TABS: ORAL_TABLET | ORAL | Status: DC

## 2013-09-05 MED ORDER — INSULIN GLARGINE 100 UNIT/ML ~~LOC~~ SOLN: 23.0000 [IU] | Freq: Two times a day (BID) | SUBCUTANEOUS | Status: DC

## 2013-09-05 MED ORDER — DSS 100 MG PO CAPS: 100.0000 mg | ORAL_CAPSULE | Freq: Two times a day (BID) | ORAL | Status: DC

## 2013-09-05 MED ORDER — INSULIN ASPART 100 UNIT/ML ~~LOC~~ SOLN: 0.0000 [IU] | Freq: Three times a day (TID) | SUBCUTANEOUS | Status: DC

## 2013-09-05 MED ORDER — TRAMADOL HCL 50 MG PO TABS: 50.0000 mg | ORAL_TABLET | Freq: Four times a day (QID) | ORAL | Status: DC | PRN

## 2013-09-05 MED ORDER — INSULIN GLARGINE 100 UNIT/ML ~~LOC~~ SOLN
23.0000 [IU] | Freq: Two times a day (BID) | SUBCUTANEOUS | Status: DC
  Administered 2013-09-05: 23 [IU] via SUBCUTANEOUS
  Filled 2013-09-05 (×2): qty 0.23

## 2013-09-05 MED ORDER — POTASSIUM CHLORIDE CRYS ER 20 MEQ PO TBCR: 20.0000 meq | EXTENDED_RELEASE_TABLET | Freq: Two times a day (BID) | ORAL | Status: DC

## 2013-09-05 MED ORDER — POLYETHYLENE GLYCOL 3350 17 G PO PACK: 17.0000 g | PACK | Freq: Every day | ORAL | Status: DC

## 2013-09-05 NOTE — Clinical Social Work Note (Signed)
Clinical Social Worker facilitated patient discharge including contacting patient family and facility to confirm patient discharge plans.  Clinical information faxed to facility and family agreeable with plan.  CSW arranged ambulance transport via [facility] to [facility].  RN to call report prior to discharge.  Patient to return to hospital for surgery on Tuesday 09/11/2013.  Facility aware and will make arrangements for patient return.  Clinical Social Worker will sign off for now as social work intervention is no longer needed. Please consult [place] again if new need arises.  Barbette Or, [place]

## 2013-09-05 NOTE — Progress Notes (Signed)
Patient ID: John Carter, male   DOB: March 10, 1930, 80 y.o.   MRN: [id number]   LOS: 6 days   Subjective: No c/o but lots of questions.   Objective: Vital signs in last 24 hours: Temp:  [97.4 F (36.3 C)-98.6 F (37 C)] 98.2 F (36.8 C) (02/18 1004) Pulse Rate:  [63-65] 65 (02/18 1004) Resp:  [16-20] 18 (02/18 1004) BP: (136-157)/(55-76) 155/55 mmHg (02/18 1004) SpO2:  [96 %-98 %] 96 % (02/18 1004) Last BM Date: 09/03/13   Laboratory  CBC  Recent Labs  09/04/13 0518 09/05/13 0650  WBC 12.6* 13.5*  HGB 11.4* 11.9*  HCT 32.6* 34.3*  PLT 80* 79*   BMET  Recent Labs  09/04/13 0518 09/05/13 0650  NA 140 133*  K 3.5* 4.6  CL 103 102  CO2 20 16*  GLUCOSE 261* 288*  BUN 31* 26*  CREATININE 1.07 0.88  CALCIUM 7.0* 7.0*   CBG (last 3)   Recent Labs  09/04/13 1718 09/04/13 2158 09/05/13 0648  GLUCAP 254* 329* 268*    Physical Exam General appearance: alert and no distress Resp: clear to auscultation bilaterally Cardio: regular rate and rhythm GI: normal findings: bowel sounds normal and soft, non-tender Neuro: 1+ bil tri's, 0 bil bi's/grip   Assessment/Plan: Fall  Central cord - complicated by AS, Dr. Vertell Limber following, plans posterior fixation next Tuesday. PT/OT. Will start steroid taper IDDM - appreciate Dr. Maxwell Caul F/U, increased SSI, Lantus today, decadron complicates this  HTN/CAD - Home meds except Plavix  Thrombocytopenia - Improved  FEN - Supplement K+  VTE - SCD's  Dispo - SNF today    Lisette Abu, PA-C Pager: [phone] General Trauma PA Pager: [phone]   09/05/2013
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
## 2013-09-05 NOTE — Discharge Summary (Signed)
Ready for transfer to SNF   Kathryne Eriksson. Dahlia Bailiff, MD, [place] [phone] Trauma Surgeon

## 2013-09-05 NOTE — Progress Notes (Signed)
UR completed.  Araseli Sherry, RN BSN [facility] CCM Trauma/Neuro ICU Case Manager [phone]  

## 2013-09-05 NOTE — Progress Notes (Signed)
Pt discharge education and instructions completed. Pt picked up by [facility] to his disposition. Pt transported out via stretcher with spouse and belongings at side.

## 2013-09-05 NOTE — Progress Notes (Signed)
Pt A&O x4, report called off to nurse Gae Bon at [facility]. Pt spouse remains at side with is belongings, pt up in chair awaiting on [facility] to pick him up to his disposition. Pt IV removed, foley remains clean and intact, pt denies any pain and remains comfortable in chair. Will continue to monitor till he's picked up.

## 2013-09-05 NOTE — Progress Notes (Signed)
Sugars still high despite 20 Units bid of Lantus. Will continue to escalate. Of course, as steroids are lowered, then sugar will improve. Not sure of what plan is in terms of steroid lowering.   No other recommendations.

## 2013-09-05 NOTE — Progress Notes (Signed)
Subjective: Patient reports "They say I need to go to another place til Tuesday. I wished he could do the surgery earlier"  Objective: Vital signs in last 24 hours: Temp:  [97.4 F (36.3 C)-98.6 F (37 C)] 97.4 F (36.3 C) (02/18 0519) Pulse Rate:  [63-71] 65 (02/18 0519) Resp:  [16-20] 16 (02/18 0519) BP: (136-157)/(55-76) 157/64 mmHg (02/18 0519) SpO2:  [92 %-98 %] 98 % (02/18 0519)  Intake/Output from previous day: 02/17 0701 - 02/18 0700 In: -  Out: 1850 [Urine:1850] Intake/Output this shift: Total I/O In: 240 [P.O.:240] Out: -   Alert, conversant. Exam without change: BUE movement by shoulders, BLE FROM, right slightly weaker than left.   Lab Results:  Recent Labs  09/04/13 0518 09/05/13 0650  WBC 12.6* 13.5*  HGB 11.4* 11.9*  HCT 32.6* 34.3*  PLT 80* 79*   BMET  Recent Labs  09/04/13 0518 09/05/13 0650  NA 140 133*  K 3.5* 4.6  CL 103 102  CO2 20 16*  GLUCOSE 261* 288*  BUN 31* 26*  CREATININE 1.07 0.88  CALCIUM 7.0* 7.0*    Studies/Results: No results found.  Assessment/Plan:   LOS: 6 days  Planning for SNF with readmit for surgery & CIR to follow. Explained need for delay to pt  - need to allow plavix out of his system -he verbalizes understanding, but remains anxious about travel.   [name] 09/05/2013, 8:33 AM

## 2013-09-05 NOTE — Progress Notes (Signed)
Physical Therapy Treatment Patient Details Name: John Carter MRN: [id number] DOB: 1929/10/04 Today's Date: 09/05/2013 Time: 6644-0347 PT Time Calculation (min): 18 min  PT Assessment / Plan / Recommendation  History of Present Illness Pt s/p fall on sidewalk now presenting with central cord syndrome. Pt with minimal UE function.   PT Comments   Patient showing some improvement with sitting balance this session. Patient is now planning to DC to SNF and back on Tuesday for surgery.   Follow Up Recommendations  CIR     Does the patient have the potential to tolerate intense rehabilitation     Barriers to Discharge        Equipment Recommendations       Recommendations for Other Services    Frequency Min 4X/week   Progress towards PT Goals Progress towards PT goals: Progressing toward goals  Plan Current plan remains appropriate    Precautions / Restrictions Precautions Precautions: Fall;Cervical Required Braces or Orthoses: Cervical Brace Cervical Brace: Hard collar   Pertinent Vitals/Pain no apparent distress     Mobility  Bed Mobility Sidelying to sit: Max assist;+2 for physical assistance General bed mobility comments: pt able to initiate LE mvmt and able to position LEs, unable to use bilat UE functionally, dependent for trunk management as well Transfers Overall transfer level: Needs assistance Equipment used: 2 person hand held assist Sit to Stand: +2 physical assistance;Total assist Squat pivot transfers: +2 physical assistance;Total assist General transfer comment: A for all aspects. B LEs blocked with transfer. Patient appeared a little more steady this session with transfer    Exercises     PT Diagnosis:    PT Problem List:   PT Treatment Interventions:     PT Goals (current goals can now be found in the care plan section)    Visit Information  Last PT Received On: 09/05/13 Assistance Needed: +2 History of Present Illness: Pt s/p fall on  sidewalk now presenting with central cord syndrome. Pt with minimal UE function.    Subjective Data      Cognition  Cognition Arousal/Alertness: Awake/alert Behavior During Therapy: WFL for tasks assessed/performed Overall Cognitive Status: Within Functional Limits for tasks assessed    Balance  Balance Sitting balance-Leahy Scale: Poor Standing balance-Leahy Scale: Zero  End of Session PT - End of Session Equipment Utilized During Treatment: Gait belt;Cervical collar Activity Tolerance: Patient tolerated treatment well Patient left: in chair;with call bell/phone within reach Nurse Communication: Mobility status;Need for lift equipment   GP     Robinette, Tonia Brooms 09/05/2013, 2:31 PM 09/05/2013 Jacqualyn Posey PTA [phone] pager [phone] office

## 2013-09-05 NOTE — Progress Notes (Signed)
Ready for SNF  Kathryne Eriksson. Dahlia Bailiff, MD, [place] [phone] Trauma Surgeon

## 2013-09-10 ENCOUNTER — Encounter (HOSPITAL_COMMUNITY): Payer: Self-pay

## 2013-09-10 ENCOUNTER — Encounter (HOSPITAL_COMMUNITY): Payer: Self-pay | Admitting: *Deleted

## 2013-09-10 MED ORDER — CEFAZOLIN SODIUM-DEXTROSE 2-3 GM-% IV SOLR
2.0000 g | INTRAVENOUS | Status: AC
  Administered 2013-09-11: 2 g via INTRAVENOUS
  Filled 2013-09-10: qty 50

## 2013-09-10 NOTE — Progress Notes (Signed)
Spoke with Sharyn Lull, nurse at [facility]. She verified allergies, meds and medical hx. I also spoke with pt's wife, she was there visiting pt. She will be coming tomorrow and will meet pt here at 5:30 AM. He is arriving via ambulance. He has no use of his arms and hands.

## 2013-09-11 ENCOUNTER — Encounter (HOSPITAL_COMMUNITY): Payer: Self-pay | Admitting: Anesthesiology

## 2013-09-11 ENCOUNTER — Encounter (HOSPITAL_COMMUNITY)
Admission: RE | Disposition: A | Discharge status: Rehabilitation Facility | Payer: Self-pay | Source: Ambulatory Visit | Attending: Neurosurgery

## 2013-09-11 ENCOUNTER — Inpatient Hospital Stay (HOSPITAL_COMMUNITY)
Admission: RE | Admit: 2013-09-11 | Discharge: 2013-09-13 | DRG: 030 | Disposition: A | Discharge status: Rehabilitation Facility | Payer: Medicare Other | Source: Ambulatory Visit | Attending: Neurosurgery | Admitting: Neurosurgery

## 2013-09-11 ENCOUNTER — Inpatient Hospital Stay (HOSPITAL_COMMUNITY): Payer: Medicare Other | Admitting: Anesthesiology

## 2013-09-11 ENCOUNTER — Inpatient Hospital Stay (HOSPITAL_COMMUNITY): Payer: Medicare Other

## 2013-09-11 ENCOUNTER — Encounter (HOSPITAL_COMMUNITY): Payer: Medicare Other | Admitting: Anesthesiology

## 2013-09-11 DIAGNOSIS — W010XXA Fall on same level from slipping, tripping and stumbling without subsequent striking against object, initial encounter: Secondary | ICD-10-CM | POA: Diagnosis present

## 2013-09-11 DIAGNOSIS — S14103A Unspecified injury at C3 level of cervical spinal cord, initial encounter: Secondary | ICD-10-CM | POA: Diagnosis present

## 2013-09-11 DIAGNOSIS — S0083XA Contusion of other part of head, initial encounter: Secondary | ICD-10-CM

## 2013-09-11 DIAGNOSIS — S14121A Central cord syndrome at C1 level of cervical spinal cord, initial encounter: Principal | ICD-10-CM | POA: Diagnosis present

## 2013-09-11 DIAGNOSIS — S1093XA Contusion of unspecified part of neck, initial encounter: Secondary | ICD-10-CM

## 2013-09-11 DIAGNOSIS — Y93K1 Activity, walking an animal: Secondary | ICD-10-CM

## 2013-09-11 DIAGNOSIS — M459 Ankylosing spondylitis of unspecified sites in spine: Secondary | ICD-10-CM | POA: Diagnosis present

## 2013-09-11 DIAGNOSIS — S0003XA Contusion of scalp, initial encounter: Secondary | ICD-10-CM | POA: Diagnosis present

## 2013-09-11 HISTORY — PX: POSTERIOR CERVICAL FUSION/FORAMINOTOMY: SHX5038

## 2013-09-11 HISTORY — DX: Central cord syndrome at unspecified level of cervical spinal cord, initial encounter: S14.129A

## 2013-09-11 HISTORY — DX: Type 2 diabetes mellitus without complications: E11.9

## 2013-09-11 LAB — BASIC METABOLIC PANEL
BUN: 54 mg/dL — AB (ref 6–23)
CO2: 14 meq/L — AB (ref 19–32)
CREATININE: 1.18 mg/dL (ref 0.50–1.35)
Calcium: 9 mg/dL (ref 8.4–10.5)
Chloride: 102 mEq/L (ref 96–112)
GFR calc Af Amer: 65 mL/min — ABNORMAL LOW (ref >90)
GFR calc non Af Amer: 56 mL/min — ABNORMAL LOW (ref >90)
GLUCOSE: 135 mg/dL — AB (ref 70–99)
POTASSIUM: 5.1 meq/L (ref 3.7–5.3)
Sodium: 134 mEq/L — ABNORMAL LOW (ref 137–147)

## 2013-09-11 LAB — CBC
HEMATOCRIT: 36.3 % — AB (ref 39.0–52.0)
HEMOGLOBIN: 12.6 g/dL — AB (ref 13.0–17.0)
MCH: 32.9 pg (ref 26.0–34.0)
MCHC: 34.7 g/dL (ref 30.0–36.0)
MCV: 94.8 fL (ref 78.0–100.0)
Platelets: 53 10*3/uL — ABNORMAL LOW (ref 150–400)
RBC: 3.83 MIL/uL — ABNORMAL LOW (ref 4.22–5.81)
RDW: 14.3 % (ref 11.5–15.5)
WBC: 21.7 10*3/uL — ABNORMAL HIGH (ref 4.0–10.5)

## 2013-09-11 LAB — GLUCOSE, CAPILLARY
Glucose-Capillary: 189 mg/dL — ABNORMAL HIGH (ref 70–99)
Glucose-Capillary: 205 mg/dL — ABNORMAL HIGH (ref 70–99)
Glucose-Capillary: 208 mg/dL — ABNORMAL HIGH (ref 70–99)
Glucose-Capillary: 245 mg/dL — ABNORMAL HIGH (ref 70–99)

## 2013-09-11 LAB — SURGICAL PCR SCREEN
MRSA, PCR: NEGATIVE
Staphylococcus aureus: NEGATIVE

## 2013-09-11 SURGERY — POSTERIOR CERVICAL FUSION/FORAMINOTOMY LEVEL 1
Anesthesia: General

## 2013-09-11 MED ORDER — MENTHOL 3 MG MT LOZG: 1.0000 | LOZENGE | OROMUCOSAL | Status: DC | PRN

## 2013-09-11 MED ORDER — MIDAZOLAM HCL 2 MG/2ML IJ SOLN
INTRAMUSCULAR | Status: AC
  Filled 2013-09-11: qty 2

## 2013-09-11 MED ORDER — PROPOFOL 10 MG/ML IV BOLUS
INTRAVENOUS | Status: AC
  Filled 2013-09-11: qty 20

## 2013-09-11 MED ORDER — ROCURONIUM BROMIDE 100 MG/10ML IV SOLN
INTRAVENOUS | Status: DC | PRN
  Administered 2013-09-11: 10 mg via INTRAVENOUS
  Administered 2013-09-11: 20 mg via INTRAVENOUS
  Administered 2013-09-11: 30 mg via INTRAVENOUS
  Administered 2013-09-11 (×2): 10 mg via INTRAVENOUS

## 2013-09-11 MED ORDER — ROCURONIUM BROMIDE 50 MG/5ML IV SOLN
INTRAVENOUS | Status: AC
  Filled 2013-09-11: qty 1

## 2013-09-11 MED ORDER — ONDANSETRON HCL 4 MG/2ML IJ SOLN
INTRAMUSCULAR | Status: AC
  Filled 2013-09-11: qty 2

## 2013-09-11 MED ORDER — IRBESARTAN 75 MG PO TABS
75.0000 mg | ORAL_TABLET | Freq: Every day | ORAL | Status: DC
  Administered 2013-09-12 – 2013-09-13 (×2): 75 mg via ORAL
  Filled 2013-09-11 (×3): qty 1

## 2013-09-11 MED ORDER — OXYCODONE HCL 5 MG PO TABS: 5.0000 mg | ORAL_TABLET | Freq: Once | ORAL | Status: DC | PRN

## 2013-09-11 MED ORDER — HEMOSTATIC AGENTS (NO CHARGE) OPTIME
TOPICAL | Status: DC | PRN
  Administered 2013-09-11: 1 via TOPICAL

## 2013-09-11 MED ORDER — ALUM & MAG HYDROXIDE-SIMETH 200-200-20 MG/5ML PO SUSP: 30.0000 mL | Freq: Four times a day (QID) | ORAL | Status: DC | PRN

## 2013-09-11 MED ORDER — PROPOFOL 10 MG/ML IV BOLUS
INTRAVENOUS | Status: DC | PRN
  Administered 2013-09-11: 100 mg via INTRAVENOUS

## 2013-09-11 MED ORDER — EPHEDRINE SULFATE 50 MG/ML IJ SOLN
INTRAMUSCULAR | Status: DC | PRN
  Administered 2013-09-11: 10 mg via INTRAVENOUS
  Administered 2013-09-11: 5 mg via INTRAVENOUS

## 2013-09-11 MED ORDER — LIDOCAINE-EPINEPHRINE 1 %-1:100000 IJ SOLN
INTRAMUSCULAR | Status: DC | PRN
  Administered 2013-09-11: 5 mL

## 2013-09-11 MED ORDER — DEXAMETHASONE SODIUM PHOSPHATE 4 MG/ML IJ SOLN
4.0000 mg | Freq: Four times a day (QID) | INTRAMUSCULAR | Status: DC
  Administered 2013-09-11 – 2013-09-12 (×4): 4 mg via INTRAVENOUS
  Filled 2013-09-11 (×12): qty 1

## 2013-09-11 MED ORDER — SODIUM CHLORIDE 0.9 % IJ SOLN
INTRAMUSCULAR | Status: AC
  Filled 2013-09-11: qty 10

## 2013-09-11 MED ORDER — LIDOCAINE HCL (CARDIAC) 20 MG/ML IV SOLN
INTRAVENOUS | Status: AC
  Filled 2013-09-11: qty 5

## 2013-09-11 MED ORDER — METOPROLOL SUCCINATE ER 50 MG PO TB24
50.0000 mg | ORAL_TABLET | Freq: Every morning | ORAL | Status: DC
  Administered 2013-09-12 – 2013-09-13 (×2): 50 mg via ORAL
  Filled 2013-09-11 (×2): qty 1

## 2013-09-11 MED ORDER — MEPERIDINE HCL 25 MG/ML IJ SOLN: 6.2500 mg | INTRAMUSCULAR | Status: DC | PRN

## 2013-09-11 MED ORDER — THROMBIN 5000 UNITS EX SOLR
CUTANEOUS | Status: DC | PRN
  Administered 2013-09-11 (×2): 5000 [IU] via TOPICAL

## 2013-09-11 MED ORDER — ACETAMINOPHEN 325 MG PO TABS: 650.0000 mg | ORAL_TABLET | ORAL | Status: DC | PRN

## 2013-09-11 MED ORDER — EPHEDRINE SULFATE 50 MG/ML IJ SOLN
INTRAMUSCULAR | Status: AC
  Filled 2013-09-11: qty 1

## 2013-09-11 MED ORDER — VITAMIN D (ERGOCALCIFEROL) 1.25 MG (50000 UNIT) PO CAPS: 50000.0000 [IU] | ORAL_CAPSULE | ORAL | Status: DC

## 2013-09-11 MED ORDER — LACTATED RINGERS IV SOLN
INTRAVENOUS | Status: DC | PRN
  Administered 2013-09-11 (×2): via INTRAVENOUS

## 2013-09-11 MED ORDER — BUPIVACAINE HCL (PF) 0.5 % IJ SOLN
INTRAMUSCULAR | Status: DC | PRN
  Administered 2013-09-11: 5 mL

## 2013-09-11 MED ORDER — DEXAMETHASONE 4 MG PO TABS: 4.0000 mg | ORAL_TABLET | Freq: Four times a day (QID) | ORAL | Status: DC

## 2013-09-11 MED ORDER — SUCCINYLCHOLINE CHLORIDE 20 MG/ML IJ SOLN
INTRAMUSCULAR | Status: DC | PRN
  Administered 2013-09-11: 140 mg via INTRAVENOUS

## 2013-09-11 MED ORDER — DIAZEPAM 5 MG PO TABS: 5.0000 mg | ORAL_TABLET | Freq: Four times a day (QID) | ORAL | Status: DC | PRN

## 2013-09-11 MED ORDER — THERA M PLUS PO TABS: 1.0000 | ORAL_TABLET | Freq: Every day | ORAL | Status: DC

## 2013-09-11 MED ORDER — TRAMADOL HCL 50 MG PO TABS
50.0000 mg | ORAL_TABLET | Freq: Four times a day (QID) | ORAL | Status: DC | PRN
  Administered 2013-09-11 – 2013-09-13 (×3): 100 mg via ORAL
  Filled 2013-09-11 (×3): qty 2

## 2013-09-11 MED ORDER — ARTIFICIAL TEARS OP OINT
TOPICAL_OINTMENT | OPHTHALMIC | Status: AC
  Filled 2013-09-11: qty 3.5

## 2013-09-11 MED ORDER — FUROSEMIDE 20 MG PO TABS
20.0000 mg | ORAL_TABLET | Freq: Every day | ORAL | Status: DC
  Administered 2013-09-12 – 2013-09-13 (×2): 20 mg via ORAL
  Filled 2013-09-11 (×3): qty 1

## 2013-09-11 MED ORDER — AMLODIPINE BESYLATE 5 MG PO TABS
5.0000 mg | ORAL_TABLET | Freq: Every day | ORAL | Status: DC
  Administered 2013-09-12 – 2013-09-13 (×2): 5 mg via ORAL
  Filled 2013-09-11 (×2): qty 1

## 2013-09-11 MED ORDER — HYDROCODONE-ACETAMINOPHEN 5-325 MG PO TABS: 1.0000 | ORAL_TABLET | ORAL | Status: DC | PRN

## 2013-09-11 MED ORDER — DEXAMETHASONE SODIUM PHOSPHATE 10 MG/ML IJ SOLN
INTRAMUSCULAR | Status: AC
  Filled 2013-09-11: qty 1

## 2013-09-11 MED ORDER — DOCUSATE SODIUM 100 MG PO CAPS
100.0000 mg | ORAL_CAPSULE | Freq: Two times a day (BID) | ORAL | Status: DC
  Administered 2013-09-11 – 2013-09-12 (×2): 100 mg via ORAL
  Filled 2013-09-11 (×6): qty 1

## 2013-09-11 MED ORDER — OXYCODONE-ACETAMINOPHEN 5-325 MG PO TABS: 1.0000 | ORAL_TABLET | ORAL | Status: DC | PRN

## 2013-09-11 MED ORDER — MIDAZOLAM HCL 5 MG/5ML IJ SOLN
INTRAMUSCULAR | Status: DC | PRN
  Administered 2013-09-11: 2 mg via INTRAVENOUS

## 2013-09-11 MED ORDER — GLYCOPYRROLATE 0.2 MG/ML IJ SOLN
INTRAMUSCULAR | Status: DC | PRN
  Administered 2013-09-11: .8 mg via INTRAVENOUS

## 2013-09-11 MED ORDER — GLYCOPYRROLATE 0.2 MG/ML IJ SOLN
INTRAMUSCULAR | Status: AC
  Filled 2013-09-11: qty 2

## 2013-09-11 MED ORDER — SIMVASTATIN 20 MG PO TABS
20.0000 mg | ORAL_TABLET | Freq: Every day | ORAL | Status: DC
  Administered 2013-09-11 – 2013-09-12 (×2): 20 mg via ORAL
  Filled 2013-09-11 (×3): qty 1

## 2013-09-11 MED ORDER — ONDANSETRON HCL 4 MG/2ML IJ SOLN: 4.0000 mg | Freq: Once | INTRAMUSCULAR | Status: DC | PRN

## 2013-09-11 MED ORDER — SENNOSIDES-DOCUSATE SODIUM 8.6-50 MG PO TABS
1.0000 | ORAL_TABLET | Freq: Every evening | ORAL | Status: DC | PRN
  Filled 2013-09-11: qty 1

## 2013-09-11 MED ORDER — HYDROMORPHONE HCL PF 1 MG/ML IJ SOLN: 0.2500 mg | INTRAMUSCULAR | Status: DC | PRN

## 2013-09-11 MED ORDER — PHENYLEPHRINE HCL 10 MG/ML IJ SOLN
10.0000 mg | INTRAVENOUS | Status: DC | PRN
  Administered 2013-09-11: 15 ug/min via INTRAVENOUS

## 2013-09-11 MED ORDER — LIDOCAINE HCL (CARDIAC) 20 MG/ML IV SOLN
INTRAVENOUS | Status: DC | PRN
  Administered 2013-09-11: 100 mg via INTRAVENOUS

## 2013-09-11 MED ORDER — ACETAMINOPHEN 650 MG RE SUPP: 650.0000 mg | RECTAL | Status: DC | PRN

## 2013-09-11 MED ORDER — 0.9 % SODIUM CHLORIDE (POUR BTL) OPTIME
TOPICAL | Status: DC | PRN
  Administered 2013-09-11: 1000 mL

## 2013-09-11 MED ORDER — BISACODYL 10 MG RE SUPP: 10.0000 mg | Freq: Every day | RECTAL | Status: DC | PRN

## 2013-09-11 MED ORDER — SUCCINYLCHOLINE CHLORIDE 20 MG/ML IJ SOLN
INTRAMUSCULAR | Status: AC
  Filled 2013-09-11: qty 1

## 2013-09-11 MED ORDER — POLYETHYLENE GLYCOL 3350 17 G PO PACK
17.0000 g | PACK | Freq: Every day | ORAL | Status: DC
  Filled 2013-09-11 (×3): qty 1

## 2013-09-11 MED ORDER — ONDANSETRON HCL 4 MG/2ML IJ SOLN
INTRAMUSCULAR | Status: DC | PRN
  Administered 2013-09-11: 4 mg via INTRAVENOUS

## 2013-09-11 MED ORDER — PANTOPRAZOLE SODIUM 40 MG PO TBEC: 40.0000 mg | DELAYED_RELEASE_TABLET | Freq: Every day | ORAL | Status: DC

## 2013-09-11 MED ORDER — LIDOCAINE HCL 4 % MT SOLN
OROMUCOSAL | Status: DC | PRN
  Administered 2013-09-11: 4 mL via TOPICAL

## 2013-09-11 MED ORDER — FLUTICASONE PROPIONATE 50 MCG/ACT NA SUSP
2.0000 | Freq: Every day | NASAL | Status: DC
  Administered 2013-09-12 – 2013-09-13 (×2): 2 via NASAL
  Filled 2013-09-11: qty 16

## 2013-09-11 MED ORDER — BACITRACIN ZINC 500 UNIT/GM EX OINT
TOPICAL_OINTMENT | CUTANEOUS | Status: DC | PRN
  Administered 2013-09-11: 1 via TOPICAL

## 2013-09-11 MED ORDER — ARTIFICIAL TEARS OP OINT
TOPICAL_OINTMENT | OPHTHALMIC | Status: DC | PRN
  Administered 2013-09-11: 1 via OPHTHALMIC

## 2013-09-11 MED ORDER — SODIUM CHLORIDE 0.9 % IJ SOLN
3.0000 mL | INTRAMUSCULAR | Status: DC | PRN
Start: 2013-09-11 — End: 2013-09-13

## 2013-09-11 MED ORDER — MORPHINE SULFATE 2 MG/ML IJ SOLN: 1.0000 mg | INTRAMUSCULAR | Status: DC | PRN

## 2013-09-11 MED ORDER — PHENOL 1.4 % MT LIQD: 1.0000 | OROMUCOSAL | Status: DC | PRN

## 2013-09-11 MED ORDER — PANTOPRAZOLE SODIUM 40 MG IV SOLR
40.0000 mg | Freq: Every day | INTRAVENOUS | Status: DC
  Administered 2013-09-11: 40 mg via INTRAVENOUS
  Filled 2013-09-11 (×2): qty 40

## 2013-09-11 MED ORDER — NITROGLYCERIN 0.4 MG SL SUBL: 0.4000 mg | SUBLINGUAL_TABLET | SUBLINGUAL | Status: DC | PRN

## 2013-09-11 MED ORDER — SENNA 8.6 MG PO TABS
1.0000 | ORAL_TABLET | Freq: Two times a day (BID) | ORAL | Status: DC
  Administered 2013-09-11 – 2013-09-12 (×2): 8.6 mg via ORAL
  Filled 2013-09-11 (×6): qty 1

## 2013-09-11 MED ORDER — DSS 100 MG PO CAPS: 100.0000 mg | ORAL_CAPSULE | Freq: Two times a day (BID) | ORAL | Status: DC

## 2013-09-11 MED ORDER — MUPIROCIN 2 % EX OINT
TOPICAL_OINTMENT | Freq: Two times a day (BID) | CUTANEOUS | Status: DC
  Administered 2013-09-11: 22:00:00 via NASAL
  Administered 2013-09-11 – 2013-09-12 (×2): 1 via NASAL
  Administered 2013-09-12 – 2013-09-13 (×2): via NASAL
  Filled 2013-09-11: qty 22

## 2013-09-11 MED ORDER — NEOSTIGMINE METHYLSULFATE 1 MG/ML IJ SOLN
INTRAMUSCULAR | Status: AC
  Filled 2013-09-11: qty 10

## 2013-09-11 MED ORDER — CEFAZOLIN SODIUM 1-5 GM-% IV SOLN
1.0000 g | Freq: Three times a day (TID) | INTRAVENOUS | Status: AC
  Administered 2013-09-11 – 2013-09-12 (×2): 1 g via INTRAVENOUS
  Filled 2013-09-11 (×2): qty 50

## 2013-09-11 MED ORDER — WHITE PETROLATUM GEL
Status: AC
  Administered 2013-09-11: 1
  Filled 2013-09-11: qty 5

## 2013-09-11 MED ORDER — OXYCODONE HCL 5 MG/5ML PO SOLN: 5.0000 mg | Freq: Once | ORAL | Status: DC | PRN

## 2013-09-11 MED ORDER — MUPIROCIN 2 % EX OINT
TOPICAL_OINTMENT | CUTANEOUS | Status: AC
  Filled 2013-09-11: qty 22

## 2013-09-11 MED ORDER — INSULIN GLARGINE 100 UNIT/ML ~~LOC~~ SOLN
23.0000 [IU] | Freq: Two times a day (BID) | SUBCUTANEOUS | Status: DC
  Administered 2013-09-11 – 2013-09-13 (×4): 23 [IU] via SUBCUTANEOUS
  Filled 2013-09-11 (×5): qty 0.23

## 2013-09-11 MED ORDER — KCL IN DEXTROSE-NACL 20-5-0.45 MEQ/L-%-% IV SOLN
INTRAVENOUS | Status: DC
  Administered 2013-09-11 – 2013-09-12 (×2): via INTRAVENOUS
  Filled 2013-09-11 (×6): qty 1000

## 2013-09-11 MED ORDER — INSULIN ASPART 100 UNIT/ML ~~LOC~~ SOLN: 0.0000 [IU] | SUBCUTANEOUS | Status: DC

## 2013-09-11 MED ORDER — FLEET ENEMA 7-19 GM/118ML RE ENEM
1.0000 | ENEMA | Freq: Once | RECTAL | Status: AC | PRN
  Filled 2013-09-11: qty 1

## 2013-09-11 MED ORDER — FENTANYL CITRATE 0.05 MG/ML IJ SOLN
INTRAMUSCULAR | Status: AC
Start: 2013-09-11 — End: 2013-09-11
  Filled 2013-09-11: qty 5

## 2013-09-11 MED ORDER — DEXAMETHASONE SODIUM PHOSPHATE 10 MG/ML IJ SOLN
INTRAMUSCULAR | Status: DC | PRN
  Administered 2013-09-11: 10 mg via INTRAVENOUS

## 2013-09-11 MED ORDER — DEXAMETHASONE 4 MG PO TABS
4.0000 mg | ORAL_TABLET | Freq: Four times a day (QID) | ORAL | Status: DC
  Administered 2013-09-12 – 2013-09-13 (×5): 4 mg via ORAL
  Filled 2013-09-11 (×8): qty 1

## 2013-09-11 MED ORDER — NEOSTIGMINE METHYLSULFATE 1 MG/ML IJ SOLN
INTRAMUSCULAR | Status: DC | PRN
  Administered 2013-09-11: 5 mg via INTRAVENOUS

## 2013-09-11 MED ORDER — FENTANYL CITRATE 0.05 MG/ML IJ SOLN
INTRAMUSCULAR | Status: DC | PRN
  Administered 2013-09-11: 50 ug via INTRAVENOUS
  Administered 2013-09-11: 150 ug via INTRAVENOUS
  Administered 2013-09-11: 50 ug via INTRAVENOUS

## 2013-09-11 MED ORDER — SODIUM CHLORIDE 0.9 % IJ SOLN
3.0000 mL | Freq: Two times a day (BID) | INTRAMUSCULAR | Status: DC
  Administered 2013-09-12 – 2013-09-13 (×3): 3 mL via INTRAVENOUS

## 2013-09-11 MED ORDER — ONDANSETRON HCL 4 MG/2ML IJ SOLN: 4.0000 mg | INTRAMUSCULAR | Status: DC | PRN

## 2013-09-11 MED ORDER — PHENYLEPHRINE 40 MCG/ML (10ML) SYRINGE FOR IV PUSH (FOR BLOOD PRESSURE SUPPORT)
PREFILLED_SYRINGE | INTRAVENOUS | Status: AC
  Filled 2013-09-11: qty 20

## 2013-09-11 MED ORDER — POTASSIUM CHLORIDE CRYS ER 20 MEQ PO TBCR
20.0000 meq | EXTENDED_RELEASE_TABLET | Freq: Two times a day (BID) | ORAL | Status: DC
  Administered 2013-09-11 – 2013-09-13 (×4): 20 meq via ORAL
  Filled 2013-09-11 (×5): qty 1

## 2013-09-11 MED ORDER — GABAPENTIN 300 MG PO CAPS
300.0000 mg | ORAL_CAPSULE | Freq: Every day | ORAL | Status: DC
  Administered 2013-09-11 – 2013-09-12 (×2): 300 mg via ORAL
  Filled 2013-09-11 (×3): qty 1

## 2013-09-11 MED ORDER — ADULT MULTIVITAMIN W/MINERALS CH
1.0000 | ORAL_TABLET | Freq: Every day | ORAL | Status: DC
  Administered 2013-09-12 – 2013-09-13 (×2): 1 via ORAL
  Filled 2013-09-11 (×3): qty 1

## 2013-09-11 MED ORDER — SODIUM CHLORIDE 0.9 % IV SOLN: 250.0000 mL | INTRAVENOUS | Status: DC

## 2013-09-11 SURGICAL SUPPLY — 85 items
APL SKNCLS STERI-STRIP NONHPOA (GAUZE/BANDAGES/DRESSINGS)
BAG DECANTER FOR FLEXI CONT (MISCELLANEOUS) ×3 IMPLANT
BENZOIN TINCTURE PRP APPL 2/3 (GAUZE/BANDAGES/DRESSINGS) IMPLANT
BIT DRILL NEURO 2X3.1 SFT TUCH (MISCELLANEOUS) IMPLANT
BIT DRILL VUEPOINT II (BIT) IMPLANT
BLADE SURG 11 STRL SS (BLADE) ×3 IMPLANT
BLADE SURG ROTATE 9660 (MISCELLANEOUS) IMPLANT
BLADE ULTRA TIP 2M (BLADE) IMPLANT
BUR PRECISION FLUTE 5.0 (BURR) IMPLANT
CANISTER SUCT 3000ML (MISCELLANEOUS) ×3 IMPLANT
CLOSURE WOUND 1/2 X4 (GAUZE/BANDAGES/DRESSINGS)
CONT SPEC 4OZ CLIKSEAL STRL BL (MISCELLANEOUS) ×3 IMPLANT
DRAPE C-ARM 42X72 X-RAY (DRAPES) ×6 IMPLANT
DRAPE LAPAROTOMY 100X72 PEDS (DRAPES) ×3 IMPLANT
DRAPE MICROSCOPE LEICA (MISCELLANEOUS) IMPLANT
DRAPE POUCH INSTRU U-SHP 10X18 (DRAPES) ×3 IMPLANT
DRESSING TELFA 8X3 (GAUZE/BANDAGES/DRESSINGS) ×3 IMPLANT
DRILL BIT VUEPOINT II (BIT) ×3
DRILL NEURO 2X3.1 SOFT TOUCH (MISCELLANEOUS)
DRSG OPSITE POSTOP 4X6 (GAUZE/BANDAGES/DRESSINGS) ×2 IMPLANT
DURAPREP 6ML APPLICATOR 50/CS (WOUND CARE) ×3 IMPLANT
ELECT BLADE 4.0 EZ CLEAN MEGAD (MISCELLANEOUS) ×3
ELECT REM PT RETURN 9FT ADLT (ELECTROSURGICAL) ×3
ELECTRODE BLDE 4.0 EZ CLN MEGD (MISCELLANEOUS) IMPLANT
ELECTRODE REM PT RTRN 9FT ADLT (ELECTROSURGICAL) ×1 IMPLANT
EVACUATOR 1/8 PVC DRAIN (DRAIN) ×2 IMPLANT
GAUZE SPONGE 4X4 16PLY XRAY LF (GAUZE/BANDAGES/DRESSINGS) IMPLANT
GLOVE BIO SURGEON STRL SZ8 (GLOVE) ×5 IMPLANT
GLOVE BIOGEL PI IND STRL 8 (GLOVE) ×1 IMPLANT
GLOVE BIOGEL PI IND STRL 8.5 (GLOVE) ×1 IMPLANT
GLOVE BIOGEL PI INDICATOR 8 (GLOVE) ×2
GLOVE BIOGEL PI INDICATOR 8.5 (GLOVE) ×2
GLOVE ECLIPSE 8.0 STRL XLNG CF (GLOVE) ×3 IMPLANT
GLOVE EXAM NITRILE LRG STRL (GLOVE) IMPLANT
GLOVE EXAM NITRILE MD LF STRL (GLOVE) IMPLANT
GLOVE EXAM NITRILE XL STR (GLOVE) IMPLANT
GLOVE EXAM NITRILE XS STR PU (GLOVE) IMPLANT
GLOVE INDICATOR 7.0 STRL GRN (GLOVE) ×2 IMPLANT
GLOVE SURG SS PI 7.0 STRL IVOR (GLOVE) ×6 IMPLANT
GOWN BRE IMP SLV AUR LG STRL (GOWN DISPOSABLE) IMPLANT
GOWN BRE IMP SLV AUR XL STRL (GOWN DISPOSABLE) IMPLANT
GOWN STRL REIN 2XL LVL4 (GOWN DISPOSABLE) ×1 IMPLANT
GOWN STRL REUS W/ TWL LRG LVL3 (GOWN DISPOSABLE) IMPLANT
GOWN STRL REUS W/ TWL XL LVL3 (GOWN DISPOSABLE) IMPLANT
GOWN STRL REUS W/TWL 2XL LVL3 (GOWN DISPOSABLE) ×2 IMPLANT
GOWN STRL REUS W/TWL LRG LVL3 (GOWN DISPOSABLE) ×3
GOWN STRL REUS W/TWL XL LVL3 (GOWN DISPOSABLE) ×6
HEMOSTAT SURGICEL 2X14 (HEMOSTASIS) IMPLANT
KIT BASIN OR (CUSTOM PROCEDURE TRAY) ×3 IMPLANT
KIT INFUSE X SMALL 1.4CC (Orthopedic Implant) ×2 IMPLANT
KIT ROOM TURNOVER OR (KITS) ×3 IMPLANT
MARKER SKIN DUAL TIP RULER LAB (MISCELLANEOUS) ×3 IMPLANT
MILL MEDIUM DISP (BLADE) ×2 IMPLANT
NDL HYPO 18GX1.5 BLUNT FILL (NEEDLE) IMPLANT
NDL HYPO 25X1 1.5 SAFETY (NEEDLE) ×1 IMPLANT
NDL SPNL 22GX3.5 QUINCKE BK (NEEDLE) ×1 IMPLANT
NEEDLE HYPO 18GX1.5 BLUNT FILL (NEEDLE) IMPLANT
NEEDLE HYPO 25X1 1.5 SAFETY (NEEDLE) ×3 IMPLANT
NEEDLE SPNL 22GX3.5 QUINCKE BK (NEEDLE) ×3 IMPLANT
NS IRRIG 1000ML POUR BTL (IV SOLUTION) ×3 IMPLANT
PACK LAMINECTOMY NEURO (CUSTOM PROCEDURE TRAY) ×3 IMPLANT
PAD ABD 8X10 STRL (GAUZE/BANDAGES/DRESSINGS) IMPLANT
PIN MAYFIELD SKULL DISP (PIN) ×3 IMPLANT
ROD VUEPOINT 80MM (Rod) ×2 IMPLANT
RUBBERBAND STERILE (MISCELLANEOUS) IMPLANT
SCREW MA MM 3.5X14 (Screw) ×12 IMPLANT
SCREW SET THREADED (Screw) ×12 IMPLANT
SPONGE GAUZE 4X4 12PLY (GAUZE/BANDAGES/DRESSINGS) ×3 IMPLANT
SPONGE INTESTINAL PEANUT (DISPOSABLE) IMPLANT
SPONGE SURGIFOAM ABS GEL SZ50 (HEMOSTASIS) ×3 IMPLANT
STAPLER SKIN PROX WIDE 3.9 (STAPLE) ×3 IMPLANT
STRIP CLOSURE SKIN 1/2X4 (GAUZE/BANDAGES/DRESSINGS) IMPLANT
SUT ETHILON 3 0 FSL (SUTURE) ×3 IMPLANT
SUT VIC AB 0 CT1 18XCR BRD8 (SUTURE) ×1 IMPLANT
SUT VIC AB 0 CT1 8-18 (SUTURE) ×3
SUT VIC AB 2-0 CP2 18 (SUTURE) ×3 IMPLANT
SUT VIC AB 3-0 SH 8-18 (SUTURE) IMPLANT
SYR 20ML ECCENTRIC (SYRINGE) ×3 IMPLANT
SYR 3ML LL SCALE MARK (SYRINGE) IMPLANT
TOWEL OR 17X24 6PK STRL BLUE (TOWEL DISPOSABLE) ×3 IMPLANT
TOWEL OR 17X26 10 PK STRL BLUE (TOWEL DISPOSABLE) ×3 IMPLANT
TRAP SPECIMEN MUCOUS 40CC (MISCELLANEOUS) ×2 IMPLANT
TRAY FOLEY CATH 14FRSI W/METER (CATHETERS) IMPLANT
UNDERPAD 30X30 INCONTINENT (UNDERPADS AND DIAPERS) ×3 IMPLANT
WATER STERILE IRR 1000ML POUR (IV SOLUTION) ×3 IMPLANT

## 2013-09-11 NOTE — Transfer of Care (Signed)
Immediate Anesthesia Transfer of Care Note  Patient: John Carter  Procedure(s) Performed: Procedure(s) with comments: CERVICAL THREE TO CERVICAL FOUR POSTERIOR CERVICAL FUSION/FORAMINOTOMY/DECOMPRESSION  LEVEL 1 (N/A) - C34 posterior fusion with arthrodesis and instrumentation  Patient Location: PACU  Anesthesia Type:General  Level of Consciousness: awake, alert  and oriented  Airway & Oxygen Therapy: Patient Spontanous Breathing and Patient connected to face mask oxygen  Post-op Assessment: Report given to PACU RN  Post vital signs: Reviewed and stable  Complications: No apparent anesthesia complications

## 2013-09-11 NOTE — Progress Notes (Signed)
On repeat examination, patient's lower extremities are equally strong to his preoperative state with no movement of either upper extremity.  Patient alert, conversant.  Otherwise doing well.

## 2013-09-11 NOTE — Progress Notes (Signed)
Physical medicine rehabilitation consult requested. Surgery completed today 09/11/2013. Will await physical occupational therapy evaluation followup  and recommendations and followup with formal rehabilitation consult at that time

## 2013-09-11 NOTE — Progress Notes (Signed)
Dr Vertell Limber here again to check pt. He is still unable to move hands or arms, but tells MD he can feel him touching upper extremities. He is now able to minimally move his feet, L>R. Will cont to monitor.

## 2013-09-11 NOTE — Progress Notes (Signed)
Awake, alert, conversant.  No upper extremity function.  Moves both legs weakly to command.  States numbness is similar to preop.

## 2013-09-11 NOTE — H&P (View-Only) (Signed)
Subjective: Patient reports "They say I need to go to another place til Tuesday. I wished he could do the surgery earlier"  Objective: Vital signs in last 24 hours: Temp:  [97.4 F (36.3 C)-98.6 F (37 C)] 97.4 F (36.3 C) (02/18 0519) Pulse Rate:  [63-71] 65 (02/18 0519) Resp:  [16-20] 16 (02/18 0519) BP: (136-157)/(55-76) 157/64 mmHg (02/18 0519) SpO2:  [92 %-98 %] 98 % (02/18 0519)  Intake/Output from previous day: 02/17 0701 - 02/18 0700 In: -  Out: 1850 [Urine:1850] Intake/Output this shift: Total I/O In: 240 [P.O.:240] Out: -   Alert, conversant. Exam without change: BUE movement by shoulders, BLE FROM, right slightly weaker than left.   Lab Results:  Recent Labs  09/04/13 0518 09/05/13 0650  WBC 12.6* 13.5*  HGB 11.4* 11.9*  HCT 32.6* 34.3*  PLT 80* 79*   BMET  Recent Labs  09/04/13 0518 09/05/13 0650  NA 140 133*  K 3.5* 4.6  CL 103 102  CO2 20 16*  GLUCOSE 261* 288*  BUN 31* 26*  CREATININE 1.07 0.88  CALCIUM 7.0* 7.0*    Studies/Results: No results found.  Assessment/Plan:   LOS: 6 days  Planning for SNF with readmit for surgery & CIR to follow. Explained need for delay to pt  - need to allow plavix out of his system -he verbalizes understanding, but remains anxious about travel.   [place], Brian 09/05/2013, 8:33 AM     

## 2013-09-11 NOTE — Op Note (Signed)
09/11/2013  10:16 AM  PATIENT:  John Carter  80 y.o. male  PRE-OPERATIVE DIAGNOSIS:  central cord syndrome with cervical spinal cord injury s/p fall with Ankylosing Sponylitis  POST-OPERATIVE DIAGNOSIS: central cord syndrome with cervical spinal cord injury s/p fall with Ankylosing Sponylitis  PROCEDURE:  Procedure(s) with comments: CERVICAL TWO TO CERVICAL FOUR POSTERIOR CERVICAL FUSION/FORAMINOTOMY/DECOMPRESSION  LEVEL 1 (N/A) - C2-4 posterior fusion with arthrodesis and instrumentation  SURGEON:  Surgeon(s) and Role:    * Erline Levine, MD - Primary    * Eustace Moore, MD - Assisting  PHYSICIAN ASSISTANT:   ASSISTANTS: Poteat, RN   ANESTHESIA:   general  EBL:  Total I/O In: 1000 [I.V.:1000] Out: 600 [Urine:500; Blood:100]  BLOOD ADMINISTERED:none  DRAINS: (Medium) Hemovact drain(s) in the epidural space with  Suction Open   LOCAL MEDICATIONS USED:  LIDOCAINE   SPECIMEN:  No Specimen  DISPOSITION OF SPECIMEN:  N/A  COUNTS:  YES  TOURNIQUET:  * No tourniquets in log *  DICTATION: DICTATION: Patient is 80 year old man with Ankylosing Spondylitis who fell while walking his dog and developed a severe spinal cord injury.  He has no use of his arms and has antigravity strength in his legs.  He has thrombocytopenia and is on plavix.  Surgical decompression and stabilization was delayed for the effects of plavix to be reversed.  It was elected to take the patient to surgery for posterior cervical decompression and fusion.   PROCEDURE: Patient was brought to the OR and following smooth and uncomplicated induction of GETA using Glide Scope, patient was placed in 3 pin fixation and rolled into a prone position on the OR table in the cervical collar.  Shoulders were taped to facilitate Xray visualization. Posterior neck was shaved with clippers, prepped and draped in the usual fashion with betadine scrub followed by Duraprep.  Area of planned incision was infiltrated with local  lidocaine.  Incision was made from C2-C6 and carried through the avascular midline plane to expose these lavels and their lateral masses.  There was significant arthritis at each of these levels with facet arthropathy and bony excrescences along with autofusion of the facets from C4 down.  There was significant mobility of the facet joints at C 23 and C 34 levels and it was therefore elected to include C 2 in the fusion and decompression procedure.  Lateral mass screws were placed from C2 to C4 bilaterally according to standard landmarks and their positioning was confirmed with fluoroscopy.  The facet joints and laminae were decorticated.  Screws and rods were locked down in situ.  The spinous processes of C 2 through to the top of C 5 were then removed and the laminae were thinned with a high speed drill. A total laminectomy was performed with undercutting of the C2 lamina all the way to the top of C 5 and the foraminae were also decompressed at each level. There was a constrictive band of hypertrophied ligament which was removed at the C 34 level.The spinal cord dura appeared to be pulsatile and well decompressed.  Hemostasis was assured and a medium Hemovac drain was placed in the epidural space. Wound was closed with 0, 2-0, and 3-0 vicryl sutures and staples were used to re approximate the skin edges. Sterile occlusive dressing was placed.  Patient was taken out of pins returned to the collar and turned back onto the OR table.  Patient was extubated in the operating room and taken to recovery in stable  and satisfactory condition.  Counts were correct at the end of the case.  PLAN OF CARE: Admit to inpatient   PATIENT DISPOSITION:  PACU - hemodynamically stable.   Delay start of Pharmacological VTE agent (>24hrs) due to surgical blood loss or risk of bleeding: yes

## 2013-09-11 NOTE — Progress Notes (Signed)
Pt not moving any extremities upon adm to PACU. Dr Vertell Limber here & aware. Will cont to monitor. Pt was inc lg amt liq brown stool--inc care given.

## 2013-09-11 NOTE — Anesthesia Preprocedure Evaluation (Addendum)
Anesthesia Evaluation  Patient identified by MRN, date of birth, ID band Patient awake    Reviewed: Allergy & Precautions, H&P , NPO status , Patient's Chart, lab work & pertinent test results, reviewed documented beta blocker date and time   Airway Mallampati: III TM Distance: >3 FB Neck ROM: Limited    Dental   Pulmonary sleep apnea ,          Cardiovascular hypertension, Pt. on medications and Pt. on home beta blockers + angina + CAD, + Past MI and + Cardiac Stents     Neuro/Psych CVA    GI/Hepatic hiatal hernia, GERD-  Medicated,  Endo/Other  diabetes, Type 2, Insulin Dependent  Renal/GU      Musculoskeletal   Abdominal   Peds  Hematology   Anesthesia Other Findings   Reproductive/Obstetrics                          Anesthesia Physical Anesthesia Plan  ASA: III  Anesthesia Plan: General   Post-op Pain Management:    Induction: Intravenous  Airway Management Planned: Oral ETT and Video Laryngoscope Planned  Additional Equipment:   Intra-op Plan:   Post-operative Plan: Extubation in OR  Informed Consent: I have reviewed the patients History and Physical, chart, labs and discussed the procedure including the risks, benefits and alternatives for the proposed anesthesia with the patient or authorized representative who has indicated his/her understanding and acceptance.   Dental advisory given  Plan Discussed with: CRNA and Surgeon  Anesthesia Plan Comments:        Anesthesia Quick Evaluation

## 2013-09-11 NOTE — Preoperative (Signed)
Beta Blockers   Reason not to administer Beta Blockers:Not Applicable 

## 2013-09-11 NOTE — Anesthesia Postprocedure Evaluation (Signed)
Anesthesia Post Note  Patient: John Carter  Procedure(s) Performed: Procedure(s) (LRB): CERVICAL THREE TO CERVICAL FOUR POSTERIOR CERVICAL FUSION/FORAMINOTOMY/DECOMPRESSION  LEVEL 1 (N/A)  Anesthesia type: general  Patient location: PACU  Post pain: Pain level controlled  Post assessment: Patient's Cardiovascular Status Stable  Last Vitals:  Filed Vitals:   09/11/13 1110  BP: 115/53  Pulse: 70  Temp: 36.8 C  Resp: 15    Post vital signs: Reviewed and stable  Level of consciousness: sedated  Complications: No apparent anesthesia complications

## 2013-09-11 NOTE — Interval H&P Note (Signed)
History and Physical Interval Note:  09/11/2013 11:23 AM  John Carter  has presented today for surgery, with the diagnosis of central cord syndrome  The various methods of treatment have been discussed with the patient and family. After consideration of risks, benefits and other options for treatment, the patient has consented to  Procedure(s) with comments: CERVICAL THREE TO CERVICAL FOUR POSTERIOR CERVICAL FUSION/FORAMINOTOMY/DECOMPRESSION  LEVEL 1 (N/A) - C34 posterior fusion with arthrodesis and instrumentation as a surgical intervention .  The patient's history has been reviewed, patient examined, no change in status, stable for surgery.  I have reviewed the patient's chart and labs.  Questions were answered to the patient's satisfaction.     [name]   Patient continues to have no upper extremity function and antigravity strength in lower extremities with impaired sensation in upper and lower extremities.  He still has facial bruising from his fall.  He is alert and conversant.

## 2013-09-11 NOTE — Anesthesia Procedure Notes (Signed)
Procedure Name: Intubation Date/Time: 09/11/2013 7:45 AM Performed by: Mayah Urquidi, Virgel Gess Pre-anesthesia Checklist: Patient identified, Timeout performed, Emergency Drugs available, Suction available and Patient being monitored Patient Re-evaluated:Patient Re-evaluated prior to inductionOxygen Delivery Method: Circle system utilized Preoxygenation: Pre-oxygenation with 100% oxygen Intubation Type: IV induction Ventilation: Mask ventilation without difficulty Grade View: Grade I Tube type: Oral Tube size: 7.5 mm Number of attempts: 1 Airway Equipment and Method: Video-laryngoscopy Placement Confirmation: ETT inserted through vocal cords under direct vision,  breath sounds checked- equal and bilateral,  positive ETCO2 and CO2 detector Secured at: 23 cm Tube secured with: Tape Dental Injury: Teeth and Oropharynx as per pre-operative assessment  Comments: C Collar on as pt s/p fall.  Easy glide scope intubation with MILS.  C collar on for intubation.

## 2013-09-11 NOTE — Brief Op Note (Signed)
09/11/2013  10:16 AM  PATIENT:  John Carter  80 y.o. male  PRE-OPERATIVE DIAGNOSIS:  central cord syndrome with cervical spinal cord injury s/p fall with Ankylosing Sponylitis  POST-OPERATIVE DIAGNOSIS: central cord syndrome with cervical spinal cord injury s/p fall with Ankylosing Sponylitis  PROCEDURE:  Procedure(s) with comments: CERVICAL TWO TO CERVICAL FOUR POSTERIOR CERVICAL FUSION/FORAMINOTOMY/DECOMPRESSION  LEVEL 1 (N/A) - C2-4 posterior fusion with arthrodesis and instrumentation  SURGEON:  Surgeon(s) and Role:    * Renesmay Nesbitt, MD - Primary    * David S Jones, MD - Assisting  PHYSICIAN ASSISTANT:   ASSISTANTS: Poteat, RN   ANESTHESIA:   general  EBL:  Total I/O In: 1000 [I.V.:1000] Out: 600 [Urine:500; Blood:100]  BLOOD ADMINISTERED:none  DRAINS: (Medium) Hemovact drain(s) in the epidural space with  Suction Open   LOCAL MEDICATIONS USED:  LIDOCAINE   SPECIMEN:  No Specimen  DISPOSITION OF SPECIMEN:  N/A  COUNTS:  YES  TOURNIQUET:  * No tourniquets in log *  DICTATION: DICTATION: Patient is 80 year old man with Ankylosing Spondylitis who fell while walking his dog and developed a severe spinal cord injury.  He has no use of his arms and has antigravity strength in his legs.  He has thrombocytopenia and is on plavix.  Surgical decompression and stabilization was delayed for the effects of plavix to be reversed.  It was elected to take the patient to surgery for posterior cervical decompression and fusion.   PROCEDURE: Patient was brought to the OR and following smooth and uncomplicated induction of GETA using Glide Scope, patient was placed in 3 pin fixation and rolled into a prone position on the OR table in the cervical collar.  Shoulders were taped to facilitate Xray visualization. Posterior neck was shaved with clippers, prepped and draped in the usual fashion with betadine scrub followed by Duraprep.  Area of planned incision was infiltrated with local  lidocaine.  Incision was made from C2-C6 and carried through the avascular midline plane to expose these lavels and their lateral masses.  There was significant arthritis at each of these levels with facet arthropathy and bony excrescences along with autofusion of the facets from C4 down.  There was significant mobility of the facet joints at C 23 and C 34 levels and it was therefore elected to include C 2 in the fusion and decompression procedure.  Lateral mass screws were placed from C2 to C4 bilaterally according to standard landmarks and their positioning was confirmed with fluoroscopy.  The facet joints and laminae were decorticated.  Screws and rods were locked down in situ.  The spinous processes of C 2 through to the top of C 5 were then removed and the laminae were thinned with a high speed drill. A total laminectomy was performed with undercutting of the C2 lamina all the way to the top of C 5 and the foraminae were also decompressed at each level. There was a constrictive band of hypertrophied ligament which was removed at the C 34 level.The spinal cord dura appeared to be pulsatile and well decompressed.  Hemostasis was assured and a medium Hemovac drain was placed in the epidural space. Wound was closed with 0, 2-0, and 3-0 vicryl sutures and staples were used to re approximate the skin edges. Sterile occlusive dressing was placed.  Patient was taken out of pins returned to the collar and turned back onto the OR table.  Patient was extubated in the operating room and taken to recovery in stable   and satisfactory condition.  Counts were correct at the end of the case.  PLAN OF CARE: Admit to inpatient   PATIENT DISPOSITION:  PACU - hemodynamically stable.   Delay start of Pharmacological VTE agent (>24hrs) due to surgical blood loss or risk of bleeding: yes

## 2013-09-11 NOTE — Progress Notes (Signed)
UR completed.  Brandice Busser, RN BSN MHA CCM Trauma/Neuro ICU Case Manager [phone]  

## 2013-09-12 ENCOUNTER — Encounter (HOSPITAL_COMMUNITY): Payer: Self-pay | Admitting: Neurosurgery

## 2013-09-12 DIAGNOSIS — IMO0002 Reserved for concepts with insufficient information to code with codable children: Secondary | ICD-10-CM

## 2013-09-12 LAB — GLUCOSE, CAPILLARY
GLUCOSE-CAPILLARY: 193 mg/dL — AB (ref 70–99)
GLUCOSE-CAPILLARY: 251 mg/dL — AB (ref 70–99)
Glucose-Capillary: 251 mg/dL — ABNORMAL HIGH (ref 70–99)
Glucose-Capillary: 176 mg/dL — ABNORMAL HIGH (ref 70–99)
Glucose-Capillary: 260 mg/dL — ABNORMAL HIGH (ref 70–99)

## 2013-09-12 MED ORDER — PANTOPRAZOLE SODIUM 40 MG PO TBEC
40.0000 mg | DELAYED_RELEASE_TABLET | Freq: Every day | ORAL | Status: DC
  Administered 2013-09-12: 40 mg via ORAL
  Filled 2013-09-12: qty 1

## 2013-09-12 MED ORDER — INSULIN ASPART 100 UNIT/ML ~~LOC~~ SOLN
0.0000 [IU] | Freq: Every day | SUBCUTANEOUS | Status: DC
  Administered 2013-09-12: 3 [IU] via SUBCUTANEOUS

## 2013-09-12 MED ORDER — INSULIN ASPART 100 UNIT/ML ~~LOC~~ SOLN
0.0000 [IU] | Freq: Three times a day (TID) | SUBCUTANEOUS | Status: DC
  Administered 2013-09-12 – 2013-09-13 (×2): 3 [IU] via SUBCUTANEOUS
  Administered 2013-09-13: 8 [IU] via SUBCUTANEOUS

## 2013-09-12 NOTE — Progress Notes (Signed)
Subjective: Patient reports "I don't hurt anywhere right now" "my legs will move, but my arms still don't"  Objective: Vital signs in last 24 hours: Temp:  [97.3 F (36.3 C)-98.4 F (36.9 C)] 98.4 F (36.9 C) (02/25 0336) Pulse Rate:  [58-94] 80 (02/25 0700) Resp:  [10-18] 18 (02/25 0700) BP: (105-131)/(37-98) 105/47 mmHg (02/25 0700) SpO2:  [96 %-100 %] 100 % (02/25 0700) Weight:  [83.6 kg (184 lb 4.9 oz)] 83.6 kg (184 lb 4.9 oz) (02/24 1137)  Intake/Output from previous day: 02/24 0701 - 02/25 0700 In: 3007.5 [I.V.:2907.5; IV Piggyback:100] Out: 1610 [Urine:3070; Drains:125; Blood:100] Intake/Output this shift: Total I/O In: 360 [P.O.:360] Out: -   Alert, conversant, without c/o pain. Moves BLE to command with good strength. No movement BUE. [place] collar in use. [place] drsg changed. Incision with staples, well-approximated, without erythema, swelling, or drainage. Hemovac patent ~160ml overnight.   Lab Results:  Recent Labs  09/11/13 0657  WBC 21.7*  HGB 12.6*  HCT 36.3*  PLT 53*   BMET  Recent Labs  09/11/13 0657  NA 134*  K 5.1  CL 102  CO2 14*  GLUCOSE 135*  BUN 54*  CREATININE 1.18  CALCIUM 9.0    Studies/Results: Dg Cervical Spine 1 View  09/11/2013   CLINICAL DATA:  Cervical fusion  EXAM: DG CERVICAL SPINE - 1 VIEW; DG C-ARM 1-60 MIN  COMPARISON:  None.  FINDINGS: A single intraoperative fluoroscopic lateral spot image documents placement of screws overlying C2, C3, and C4 posterior elements. The cervicothoracic junction is not visualized.  IMPRESSION: 1. Posterior cervical fusion C2-C4.   Electronically Signed   By: Arne Cleveland M.D.   On: 09/11/2013 13:31   Dg C-arm 1-60 Min  09/11/2013   CLINICAL DATA:  Cervical fusion  EXAM: DG CERVICAL SPINE - 1 VIEW; DG C-ARM 1-60 MIN  COMPARISON:  None.  FINDINGS: A single intraoperative fluoroscopic lateral spot image documents placement of screws overlying C2, C3, and C4 posterior elements. The  cervicothoracic junction is not visualized.  IMPRESSION: 1. Posterior cervical fusion C2-C4.   Electronically Signed   By: Arne Cleveland M.D.   On: 09/11/2013 13:31    Assessment/Plan: Doing well post-op day 1.     LOS: 1 day  PT/OT and CIR as planned for central cord injury.    Poteat, Aaron Edelman 09/12/2013, 8:30 AM   Legs and arms at preop baseline.

## 2013-09-12 NOTE — Clinical Social Work Note (Signed)
Clinical Social Work Department BRIEF PSYCHOSOCIAL ASSESSMENT 09/11/2013  Patient:  John Carter, John Carter     Account Number:  [id number]     Admit date:  09/11/2013  Clinical Social Worker:  Myles Lipps  Date/Time:  09/11/2013 05:00 PM  Referred by:  Physician  Date Referred:  09/11/2013 Referred for  SNF Placement   Other Referral:   Patient from [place] type:  Family Other interview type:   Spoke to patient wife on unit hallway    PSYCHOSOCIAL DATA Living Status:  [place] Admitted from facility:  [facility] Level of care:  [place] Primary support name:  John Carter, John Carter  [phone] Primary support relationship to patient:  SPOUSE Degree of support available:   Emotionally supportive but physically limited    CURRENT CONCERNS Current Concerns  Post-Acute Placement   Other Concerns:    SOCIAL WORK ASSESSMENT / PLAN Clinical Social Worker met with patient wife on the unit hallway to offer continued support.  Patient and family are known to me from previous admission in which patient was advised to short term stay at [facility] prior to surgery.  Patient was discharged to [facility] and has now returned for surgical procedure.  Patient wife states that she is hopeful for inpatient rehab because she was disappointed with the level of care/rehab [facility] was able to provide.     CSW contacted inpatient rehab admission coordinator [facility]) to provide update.  Patient inpatient rehab admission would likely be to decrease burden of care due to patient wife inability to provide full physical assist.  Patient and patient wife have no children and limited support from the community.  CSW remains available for support and to assist with discharge planning needs. Patient and patient wife do not want to go back to SNF if inpatient rehab is able to admit.  CSW to follow up.   Assessment/plan status:  Psychosocial Support/Ongoing  Assessment of Needs Other assessment/ plan:   Information/referral to community resources:   Hol[facility] representative offered new facility list, however patient wife states that she would like to speak with inpatient rehab admissions coordinator first.  CSW to contact [facility] once permission from patient and patient wife is granted.    PATIENT'S/FAMILY'S RESPONSE TO PLAN OF CARE: Patient alert and oriented x3 up in the chair.  Patient wife shows deep concern for patient return to [facility] due to their request for a sitter if patient had a continued stay.  Patient and patient wife are actively attempting to prepare the home for patient return once able.  Patient wife is able to provide patient with good emotional support but is physically limited.  Patient wife verbalized her appreciation for CSW support and involvement.

## 2013-09-12 NOTE — Progress Notes (Signed)
Inpatient Diabetes Program Recommendations  AACE/ADA: New Consensus Statement on Inpatient Glycemic Control (2013)  Target Ranges:  Prepandial:   less than 140 mg/dL      Peak postprandial:   less than 180 mg/dL (1-2 hours)      Critically ill patients:  140 - 180 mg/dL   Reason for Visit: Results for MIGUEL, CHRISTIANA (MRN [id number]) as of 09/12/2013 12:42  Ref. Range 09/11/2013 19:38 09/11/2013 23:30 09/12/2013 03:31 09/12/2013 08:18 09/12/2013 12:00  Glucose-Capillary Latest Range: 70-99 mg/dL 245 (H) 208 (H) 193 (H) 251 (H) 251 (H)   Diabetes history: Type 2 diabetes Outpatient Diabetes medications: Lantus 23 units bid, Novolog correction Current orders for Inpatient glycemic control: Lantus 23 units bid  No current orders for correction.  Called Dr. Melven Sartorius office and received order to start Moderate Novolog correction tid with meals and HS.   Thanks, Adah Perl, RN, BC-ADM Inpatient Diabetes Coordinator Pager [phone]
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
## 2013-09-12 NOTE — Progress Notes (Signed)
I met with pt and then contacted his wife by phone. They both are in agreement to an inpt rehab admission when medically ready. Both are aware that even after a 3 to 4 week rehab stay, he still would likely need prolonged rehab at a SNF before returning home with his wife. They do not want to return to [facility]. I await Dr Vertell Limber to determine when medically ready to admit to inpt rehab. [phone]

## 2013-09-12 NOTE — Evaluation (Signed)
Physical Therapy Evaluation Patient Details Name: John Carter MRN: [id number] DOB: 12/28/1929 Today's Date: 09/12/2013 Time: 5009-3818 PT Time Calculation (min): 44 min  PT Assessment / Plan / Recommendation History of Present Illness  pt s/p fall on sidewalk with resulting central cord syndrome.  Pt went to [facility] for 5 days of rehab and returned for C2-C4 fusion. Pt with little to no UE functiona at this time.  Clinical Impression  Pt adm due to the above, has been at [facility] since initial admission returned for fusion. Pt to benefit from skilled acute PT to address deficits listed below and increase mobility and independence with transfers. Pt does demo some strength in LEs but has some tone present that limited ability to fully WB through LEs with transfers. Pt to benefit from CIR prior to returning home to increase mobility, increase knowledge of adaptive equipment and educate family.     PT Assessment  Patient needs continued PT services    Follow Up Recommendations  CIR    Does the patient have the potential to tolerate intense rehabilitation      Barriers to Discharge        Equipment Recommendations  Other (comment) (TBD)    Recommendations for Other Services Rehab consult   Frequency Min 4X/week    Precautions / Restrictions Precautions Precautions: Fall;Cervical Required Braces or Orthoses: Cervical Brace Cervical Brace: Hard collar Restrictions Weight Bearing Restrictions: No   Pertinent Vitals/Pain BP 139/54; no c/o pain.       Mobility  Bed Mobility Overal bed mobility: Needs Assistance;+2 for physical assistance Bed Mobility: Rolling;Sidelying to Sit;Sit to Sidelying Rolling: +2 for physical assistance;Max assist Sidelying to sit: Max assist;+2 for physical assistance Sit to sidelying: Max assist;+2 for physical assistance General bed mobility comments: pt able to (A) minimally with bed mobility; able to advance LEs as instructed with  incr time; demo ataxic like movement in LEs; had some abdominal strength with mobility but continues to require (A) to maintain sitting balance  Transfers Overall transfer level: Needs assistance Equipment used: 2 person hand held assist Transfers: Squat Pivot Transfers Sit to Stand: +2 physical assistance;Total assist Squat pivot transfers: +2 physical assistance;Total assist General transfer comment: use of draw pad and gt belt to perform squat pivot to chair with bil knees of pt blocked; pt very shaky and unable to acheive full upright posture; pt fearful of falling     Exercises Low Level/ICU Exercises Ankle Circles/Pumps: AROM;Both;10 reps;Seated Shoulder Flexion: PROM;5 reps;Both Elbow Flexion: PROM;Both;5 reps   PT Diagnosis: Difficulty walking;Acute pain  PT Problem List: Decreased strength;Decreased activity tolerance;Decreased balance;Decreased mobility;Decreased coordination;Impaired sensation;Impaired tone PT Treatment Interventions: DME instruction;Gait training;Functional mobility training;Therapeutic activities;Therapeutic exercise;Balance training;Neuromuscular re-education;Patient/family education     PT Goals(Current goals can be found in the care plan section) Acute Rehab PT Goals Patient Stated Goal: to use my hands PT Goal Formulation: With patient Time For Goal Achievement: 09/26/13 Potential to Achieve Goals: Good  Visit Information  Last PT Received On: 09/12/13 Assistance Needed: +2 PT/OT/SLP Co-Evaluation/Treatment: Yes Reason for Co-Treatment: Complexity of the patient's impairments (multi-system involvement);For patient/therapist safety PT goals addressed during session: Mobility/safety with mobility OT goals addressed during session: ADL's and self-care;Strengthening/ROM History of Present Illness: pt s/p fall on sidewalk with resulting central cord syndrome.  Pt went to [facility] for 5 days of rehab and returned for C2-C4 fusion. Pt with little to no  UE functiona at this time.       Prior Functioning  Home Living Family/patient expects to be discharged to:: Inpatient rehab Living Arrangements: Spouse/significant other Additional Comments: pt was living at home with spouse. 1 story home, 1 STE. pt was managing farm equip/tractors/house Prior Function Level of Independence: Independent Communication Communication: No difficulties Dominant Hand: Right    Cognition  Cognition Arousal/Alertness: Awake/alert Behavior During Therapy: WFL for tasks assessed/performed Overall Cognitive Status: Within Functional Limits for tasks assessed    Extremity/Trunk Assessment Upper Extremity Assessment Upper Extremity Assessment: Defer to OT evaluation RUE Deficits / Details: shoulder shrug only. weak shoulder depression. no active elbow flex. trace ext RUE Coordination: decreased fine motor;decreased gross motor LUE Deficits / Details: shoulder shrug only. weak elbow extension with G eleinated. increased extensor tone LUE Coordination: decreased fine motor;decreased gross motor Lower Extremity Assessment Lower Extremity Assessment: LLE deficits/detail;RLE deficits/detail RLE Deficits / Details: pt with incr tone in bil LEs; ataxic movements when instructed to perform LE exercises Rt LE grossly 3/5 RLE Sensation:  (WFL to light touch vs Lt LE ) RLE Coordination: decreased fine motor;decreased gross motor LLE Deficits / Details: more controlled movement in Lt LE vs. Rt LE; grossly 3+/5 in LEs; incr tone in bil LEs  LLE Sensation:  (WFL to light touch) LLE Coordination: decreased fine motor;decreased gross motor (ataxic like movements in LEs  ) Cervical / Trunk Assessment Cervical / Trunk Assessment: Other exceptions Cervical / Trunk Exceptions: poor midline posture. posterior lean. unable to achieve midline upright posture independently   Balance Balance Overall balance assessment: Needs assistance Sitting-balance support: Feet supported;No  upper extremity supported Sitting balance-Leahy Scale: Zero Sitting balance - Comments: poor sense on midline. trunkal ataxia Postural control: Posterior lean;Right lateral lean Standing balance support:  (unable to stand) Standing balance-Leahy Scale: Zero Standing balance comment: unable to fully achieve standing; total +2 (A) for transfers  General Comments General comments (skin integrity, edema, etc.): +BM on arrival; pt cleaned with total (A) of 2 prior to transfer to chair; pt unaware of bowel movement but reported +sensation when being cleaned   End of Session PT - End of Session Equipment Utilized During Treatment: Gait belt;Cervical collar Activity Tolerance: Patient tolerated treatment well Patient left: in chair;with call bell/phone within reach;Other (comment);with nursing/sitter in room;with family/visitor present (touch call bell in position ) Nurse Communication: Mobility status;Need for lift equipment  GP     Gustavus Bryant, Curran 09/12/2013, 12:54 PM

## 2013-09-13 ENCOUNTER — Other Ambulatory Visit: Payer: Self-pay | Admitting: Hematology and Oncology

## 2013-09-13 ENCOUNTER — Inpatient Hospital Stay (HOSPITAL_COMMUNITY)
Admission: RE | Admit: 2013-09-13 | Discharge: 2013-10-10 | DRG: 945 | Disposition: A | Discharge status: Skilled Nursing Facility | Payer: Medicare Other | Source: Intra-hospital | Attending: Physical Medicine & Rehabilitation | Admitting: Physical Medicine & Rehabilitation

## 2013-09-13 DIAGNOSIS — K5730 Diverticulosis of large intestine without perforation or abscess without bleeding: Secondary | ICD-10-CM | POA: Diagnosis present

## 2013-09-13 DIAGNOSIS — W19XXXA Unspecified fall, initial encounter: Secondary | ICD-10-CM

## 2013-09-13 DIAGNOSIS — K592 Neurogenic bowel, not elsewhere classified: Secondary | ICD-10-CM | POA: Diagnosis present

## 2013-09-13 DIAGNOSIS — E1142 Type 2 diabetes mellitus with diabetic polyneuropathy: Secondary | ICD-10-CM | POA: Diagnosis present

## 2013-09-13 DIAGNOSIS — N189 Chronic kidney disease, unspecified: Secondary | ICD-10-CM | POA: Diagnosis present

## 2013-09-13 DIAGNOSIS — G4730 Sleep apnea, unspecified: Secondary | ICD-10-CM

## 2013-09-13 DIAGNOSIS — I252 Old myocardial infarction: Secondary | ICD-10-CM

## 2013-09-13 DIAGNOSIS — Z79899 Other long term (current) drug therapy: Secondary | ICD-10-CM

## 2013-09-13 DIAGNOSIS — S14121A Central cord syndrome at C1 level of cervical spinal cord, initial encounter: Secondary | ICD-10-CM | POA: Diagnosis present

## 2013-09-13 DIAGNOSIS — S022XXA Fracture of nasal bones, initial encounter for closed fracture: Secondary | ICD-10-CM

## 2013-09-13 DIAGNOSIS — N319 Neuromuscular dysfunction of bladder, unspecified: Secondary | ICD-10-CM

## 2013-09-13 DIAGNOSIS — R413 Other amnesia: Secondary | ICD-10-CM

## 2013-09-13 DIAGNOSIS — M1710 Unilateral primary osteoarthritis, unspecified knee: Secondary | ICD-10-CM | POA: Diagnosis present

## 2013-09-13 DIAGNOSIS — Z7902 Long term (current) use of antithrombotics/antiplatelets: Secondary | ICD-10-CM

## 2013-09-13 DIAGNOSIS — I10 Essential (primary) hypertension: Secondary | ICD-10-CM | POA: Diagnosis present

## 2013-09-13 DIAGNOSIS — I129 Hypertensive chronic kidney disease with stage 1 through stage 4 chronic kidney disease, or unspecified chronic kidney disease: Secondary | ICD-10-CM | POA: Diagnosis present

## 2013-09-13 DIAGNOSIS — G4700 Insomnia, unspecified: Secondary | ICD-10-CM | POA: Diagnosis present

## 2013-09-13 DIAGNOSIS — I209 Angina pectoris, unspecified: Secondary | ICD-10-CM | POA: Diagnosis present

## 2013-09-13 DIAGNOSIS — Z5189 Encounter for other specified aftercare: Principal | ICD-10-CM

## 2013-09-13 DIAGNOSIS — G8929 Other chronic pain: Secondary | ICD-10-CM | POA: Diagnosis present

## 2013-09-13 DIAGNOSIS — I2510 Atherosclerotic heart disease of native coronary artery without angina pectoris: Secondary | ICD-10-CM | POA: Diagnosis present

## 2013-09-13 DIAGNOSIS — K219 Gastro-esophageal reflux disease without esophagitis: Secondary | ICD-10-CM | POA: Diagnosis present

## 2013-09-13 DIAGNOSIS — E1149 Type 2 diabetes mellitus with other diabetic neurological complication: Secondary | ICD-10-CM | POA: Diagnosis present

## 2013-09-13 DIAGNOSIS — Z96659 Presence of unspecified artificial knee joint: Secondary | ICD-10-CM

## 2013-09-13 DIAGNOSIS — B9689 Other specified bacterial agents as the cause of diseases classified elsewhere: Secondary | ICD-10-CM | POA: Diagnosis not present

## 2013-09-13 DIAGNOSIS — S14129A Central cord syndrome at unspecified level of cervical spinal cord, initial encounter: Secondary | ICD-10-CM | POA: Diagnosis present

## 2013-09-13 DIAGNOSIS — R252 Cramp and spasm: Secondary | ICD-10-CM

## 2013-09-13 DIAGNOSIS — N390 Urinary tract infection, site not specified: Secondary | ICD-10-CM | POA: Diagnosis not present

## 2013-09-13 DIAGNOSIS — Z87898 Personal history of other specified conditions: Secondary | ICD-10-CM

## 2013-09-13 DIAGNOSIS — T380X5A Adverse effect of glucocorticoids and synthetic analogues, initial encounter: Secondary | ICD-10-CM | POA: Diagnosis not present

## 2013-09-13 DIAGNOSIS — Z8673 Personal history of transient ischemic attack (TIA), and cerebral infarction without residual deficits: Secondary | ICD-10-CM

## 2013-09-13 DIAGNOSIS — E785 Hyperlipidemia, unspecified: Secondary | ICD-10-CM | POA: Diagnosis present

## 2013-09-13 DIAGNOSIS — M216X9 Other acquired deformities of unspecified foot: Secondary | ICD-10-CM | POA: Diagnosis present

## 2013-09-13 DIAGNOSIS — G8250 Quadriplegia, unspecified: Secondary | ICD-10-CM | POA: Diagnosis present

## 2013-09-13 LAB — GLUCOSE, CAPILLARY
GLUCOSE-CAPILLARY: 273 mg/dL — AB (ref 70–99)
GLUCOSE-CAPILLARY: 199 mg/dL — AB (ref 70–99)
GLUCOSE-CAPILLARY: 268 mg/dL — AB (ref 70–99)
Glucose-Capillary: 193 mg/dL — ABNORMAL HIGH (ref 70–99)

## 2013-09-13 MED ORDER — DEXAMETHASONE SODIUM PHOSPHATE 4 MG/ML IJ SOLN
4.0000 mg | Freq: Four times a day (QID) | INTRAMUSCULAR | Status: DC
  Administered 2013-09-19 (×2): 4 mg via INTRAVENOUS
  Filled 2013-09-13 (×32): qty 1

## 2013-09-13 MED ORDER — INSULIN ASPART 100 UNIT/ML ~~LOC~~ SOLN
0.0000 [IU] | Freq: Three times a day (TID) | SUBCUTANEOUS | Status: DC
Start: 2013-09-13 — End: 2013-09-15
  Administered 2013-09-13: 3 [IU] via SUBCUTANEOUS
  Administered 2013-09-14: 5 [IU] via SUBCUTANEOUS
  Administered 2013-09-14: 8 [IU] via SUBCUTANEOUS
  Administered 2013-09-14: 2 [IU] via SUBCUTANEOUS
  Administered 2013-09-15: 3 [IU] via SUBCUTANEOUS

## 2013-09-13 MED ORDER — PANTOPRAZOLE SODIUM 40 MG PO TBEC
40.0000 mg | DELAYED_RELEASE_TABLET | Freq: Every day | ORAL | Status: DC
  Administered 2013-09-13 – 2013-10-09 (×27): 40 mg via ORAL
  Filled 2013-09-13 (×29): qty 1

## 2013-09-13 MED ORDER — HYDROCODONE-ACETAMINOPHEN 5-325 MG PO TABS
1.0000 | ORAL_TABLET | ORAL | Status: DC | PRN
  Administered 2013-09-13: 1 via ORAL
  Administered 2013-09-13 – 2013-09-15 (×7): 2 via ORAL
  Administered 2013-09-16 – 2013-09-17 (×2): 1 via ORAL
  Administered 2013-09-18: 2 via ORAL
  Administered 2013-09-18: 1 via ORAL
  Administered 2013-09-18: 2 via ORAL
  Administered 2013-09-18 – 2013-09-19 (×3): 1 via ORAL
  Administered 2013-09-19 (×2): 2 via ORAL
  Administered 2013-09-19: 1 via ORAL
  Administered 2013-09-20 – 2013-09-21 (×5): 2 via ORAL
  Administered 2013-09-22: 1 via ORAL
  Administered 2013-09-23 – 2013-10-03 (×8): 2 via ORAL
  Administered 2013-10-04: 1 via ORAL
  Administered 2013-10-04 – 2013-10-09 (×5): 2 via ORAL
  Filled 2013-09-13: qty 2
  Filled 2013-09-13 (×2): qty 1
  Filled 2013-09-13: qty 2
  Filled 2013-09-13: qty 1
  Filled 2013-09-13 (×4): qty 2
  Filled 2013-09-13: qty 1
  Filled 2013-09-13: qty 2
  Filled 2013-09-13: qty 1
  Filled 2013-09-13 (×2): qty 2
  Filled 2013-09-13: qty 1
  Filled 2013-09-13 (×2): qty 2
  Filled 2013-09-13: qty 1
  Filled 2013-09-13 (×3): qty 2
  Filled 2013-09-13 (×2): qty 1
  Filled 2013-09-13 (×5): qty 2
  Filled 2013-09-13: qty 1
  Filled 2013-09-13 (×3): qty 2
  Filled 2013-09-13: qty 1
  Filled 2013-09-13 (×3): qty 2
  Filled 2013-09-13: qty 1
  Filled 2013-09-13 (×5): qty 2

## 2013-09-13 MED ORDER — ACETAMINOPHEN 325 MG PO TABS
650.0000 mg | ORAL_TABLET | ORAL | Status: DC | PRN
Start: 2013-09-13 — End: 2013-10-10
  Administered 2013-10-06 – 2013-10-08 (×4): 650 mg via ORAL
  Filled 2013-09-13 (×4): qty 2

## 2013-09-13 MED ORDER — SENNA 8.6 MG PO TABS
1.0000 | ORAL_TABLET | Freq: Two times a day (BID) | ORAL | Status: DC
  Administered 2013-09-13 – 2013-09-17 (×7): 8.6 mg via ORAL
  Filled 2013-09-13 (×12): qty 1

## 2013-09-13 MED ORDER — BISACODYL 10 MG RE SUPP
10.0000 mg | Freq: Every day | RECTAL | Status: DC | PRN
  Administered 2013-09-17: 10 mg via RECTAL
  Filled 2013-09-13: qty 1

## 2013-09-13 MED ORDER — SIMVASTATIN 20 MG PO TABS
20.0000 mg | ORAL_TABLET | Freq: Every day | ORAL | Status: DC
  Administered 2013-09-13 – 2013-10-09 (×27): 20 mg via ORAL
  Filled 2013-09-13 (×28): qty 1

## 2013-09-13 MED ORDER — ONDANSETRON HCL 4 MG/2ML IJ SOLN: 4.0000 mg | Freq: Four times a day (QID) | INTRAMUSCULAR | Status: DC | PRN

## 2013-09-13 MED ORDER — AMLODIPINE BESYLATE 5 MG PO TABS
5.0000 mg | ORAL_TABLET | Freq: Every day | ORAL | Status: DC
  Administered 2013-09-14 – 2013-10-10 (×27): 5 mg via ORAL
  Filled 2013-09-13 (×29): qty 1

## 2013-09-13 MED ORDER — MUPIROCIN 2 % EX OINT
TOPICAL_OINTMENT | Freq: Two times a day (BID) | CUTANEOUS | Status: DC
  Administered 2013-09-14 – 2013-09-21 (×16): via NASAL
  Administered 2013-09-22: 1 via NASAL
  Administered 2013-09-22 – 2013-10-03 (×19): via NASAL
  Administered 2013-10-03: 1 via NASAL
  Administered 2013-10-04 – 2013-10-10 (×10): via NASAL
  Filled 2013-09-13 (×3): qty 22

## 2013-09-13 MED ORDER — METHOCARBAMOL 500 MG PO TABS
500.0000 mg | ORAL_TABLET | Freq: Four times a day (QID) | ORAL | Status: DC | PRN
  Administered 2013-09-13 – 2013-09-25 (×12): 500 mg via ORAL
  Filled 2013-09-13 (×14): qty 1

## 2013-09-13 MED ORDER — POTASSIUM CHLORIDE CRYS ER 20 MEQ PO TBCR
20.0000 meq | EXTENDED_RELEASE_TABLET | Freq: Two times a day (BID) | ORAL | Status: DC
  Administered 2013-09-13: 20 meq via ORAL
  Filled 2013-09-13 (×4): qty 1

## 2013-09-13 MED ORDER — VITAMIN D (ERGOCALCIFEROL) 1.25 MG (50000 UNIT) PO CAPS
50000.0000 [IU] | ORAL_CAPSULE | ORAL | Status: DC
  Administered 2013-09-20 – 2013-10-04 (×2): 50000 [IU] via ORAL
  Filled 2013-09-13 (×5): qty 1

## 2013-09-13 MED ORDER — DOCUSATE SODIUM 100 MG PO CAPS
100.0000 mg | ORAL_CAPSULE | Freq: Two times a day (BID) | ORAL | Status: DC
Start: 2013-09-13 — End: 2013-09-18
  Administered 2013-09-13 – 2013-09-17 (×7): 100 mg via ORAL
  Filled 2013-09-13 (×12): qty 1

## 2013-09-13 MED ORDER — MAGIC MOUTHWASH
10.0000 mL | Freq: Three times a day (TID) | ORAL | Status: DC
  Administered 2013-09-13 – 2013-10-10 (×75): 10 mL via ORAL
  Filled 2013-09-13 (×83): qty 10

## 2013-09-13 MED ORDER — LENALIDOMIDE 15 MG PO CAPS: 5.0000 mg | ORAL_CAPSULE | Freq: Every day | ORAL | Status: DC

## 2013-09-13 MED ORDER — IRBESARTAN 75 MG PO TABS
75.0000 mg | ORAL_TABLET | Freq: Every day | ORAL | Status: DC
  Administered 2013-09-14 – 2013-10-10 (×27): 75 mg via ORAL
  Filled 2013-09-13 (×29): qty 1

## 2013-09-13 MED ORDER — METOPROLOL SUCCINATE ER 50 MG PO TB24
50.0000 mg | ORAL_TABLET | Freq: Every morning | ORAL | Status: DC
  Administered 2013-09-14 – 2013-10-09 (×26): 50 mg via ORAL
  Filled 2013-09-13 (×29): qty 1

## 2013-09-13 MED ORDER — SORBITOL 70 % SOLN: 30.0000 mL | Freq: Every day | Status: DC | PRN

## 2013-09-13 MED ORDER — ONDANSETRON HCL 4 MG PO TABS: 4.0000 mg | ORAL_TABLET | Freq: Four times a day (QID) | ORAL | Status: DC | PRN

## 2013-09-13 MED ORDER — FLUTICASONE PROPIONATE 50 MCG/ACT NA SUSP
2.0000 | Freq: Every day | NASAL | Status: DC
  Administered 2013-09-14 – 2013-10-10 (×22): 2 via NASAL
  Filled 2013-09-13: qty 16

## 2013-09-13 MED ORDER — FUROSEMIDE 20 MG PO TABS
20.0000 mg | ORAL_TABLET | Freq: Every day | ORAL | Status: DC
  Administered 2013-09-14 – 2013-09-17 (×4): 20 mg via ORAL
  Filled 2013-09-13 (×5): qty 1

## 2013-09-13 MED ORDER — ACETAMINOPHEN 650 MG RE SUPP: 650.0000 mg | RECTAL | Status: DC | PRN

## 2013-09-13 MED ORDER — NITROGLYCERIN 0.4 MG SL SUBL: 0.4000 mg | SUBLINGUAL_TABLET | SUBLINGUAL | Status: DC | PRN

## 2013-09-13 MED ORDER — SENNOSIDES-DOCUSATE SODIUM 8.6-50 MG PO TABS
1.0000 | ORAL_TABLET | Freq: Every evening | ORAL | Status: DC | PRN
  Filled 2013-09-13: qty 1

## 2013-09-13 MED ORDER — ADULT MULTIVITAMIN W/MINERALS CH
1.0000 | ORAL_TABLET | Freq: Every day | ORAL | Status: DC
  Administered 2013-09-14 – 2013-10-10 (×27): 1 via ORAL
  Filled 2013-09-13 (×31): qty 1

## 2013-09-13 MED ORDER — INSULIN GLARGINE 100 UNIT/ML ~~LOC~~ SOLN
23.0000 [IU] | Freq: Two times a day (BID) | SUBCUTANEOUS | Status: DC
  Administered 2013-09-13 – 2013-09-16 (×6): 23 [IU] via SUBCUTANEOUS
  Filled 2013-09-13 (×8): qty 0.23

## 2013-09-13 MED ORDER — DEXAMETHASONE 4 MG PO TABS
4.0000 mg | ORAL_TABLET | Freq: Four times a day (QID) | ORAL | Status: DC
  Administered 2013-09-13 – 2013-09-19 (×24): 4 mg via ORAL
  Filled 2013-09-13 (×32): qty 1

## 2013-09-13 MED ORDER — GABAPENTIN 300 MG PO CAPS
300.0000 mg | ORAL_CAPSULE | Freq: Every day | ORAL | Status: DC
  Administered 2013-09-13 – 2013-10-09 (×27): 300 mg via ORAL
  Filled 2013-09-13 (×28): qty 1

## 2013-09-13 MED ORDER — POLYETHYLENE GLYCOL 3350 17 G PO PACK
17.0000 g | PACK | Freq: Every day | ORAL | Status: DC
  Administered 2013-09-15 – 2013-10-01 (×11): 17 g via ORAL
  Filled 2013-09-13 (×20): qty 1

## 2013-09-13 NOTE — Progress Notes (Signed)
Subjective: Patient reports "I didn't sleep well. I don't hurt, just couldn't sleep"  Objective: Vital signs in last 24 hours: Temp:  [97.4 F (36.3 C)-97.9 F (36.6 C)] 97.9 F (36.6 C) (02/26 0343) Pulse Rate:  [62-85] 63 (02/26 0400) Resp:  [11-25] 11 (02/26 0400) BP: (98-131)/(44-57) 111/50 mmHg (02/26 0400) SpO2:  [97 %-100 %] 97 % (02/26 0400) FiO2 (%):  [92 %] 92 % (02/25 0800)  Intake/Output from previous day: 02/25 0701 - 02/26 0700 In: 6237 [P.O.:1620; I.V.:156] Out: 4930 [Urine:4900; Drains:30] Intake/Output this shift:    Without change on exam: BLE movement with equal strength. No movement BUE - shrugs only. Hemovac with minimal output overnight.  Lab Results:  Recent Labs  09/11/13 0657  WBC 21.7*  HGB 12.6*  HCT 36.3*  PLT 53*   BMET  Recent Labs  09/11/13 0657  NA 134*  K 5.1  CL 102  CO2 14*  GLUCOSE 135*  BUN 54*  CREATININE 1.18  CALCIUM 9.0    Studies/Results: Dg Cervical Spine 1 View  09/11/2013   CLINICAL DATA:  Cervical fusion  EXAM: DG CERVICAL SPINE - 1 VIEW; DG C-ARM 1-60 MIN  COMPARISON:  None.  FINDINGS: A single intraoperative fluoroscopic lateral spot image documents placement of screws overlying C2, C3, and C4 posterior elements. The cervicothoracic junction is not visualized.  IMPRESSION: 1. Posterior cervical fusion C2-C4.   Electronically Signed   By: Arne Cleveland M.D.   On: 09/11/2013 13:31   Dg C-arm 1-60 Min  09/11/2013   CLINICAL DATA:  Cervical fusion  EXAM: DG CERVICAL SPINE - 1 VIEW; DG C-ARM 1-60 MIN  COMPARISON:  None.  FINDINGS: A single intraoperative fluoroscopic lateral spot image documents placement of screws overlying C2, C3, and C4 posterior elements. The cervicothoracic junction is not visualized.  IMPRESSION: 1. Posterior cervical fusion C2-C4.   Electronically Signed   By: Arne Cleveland M.D.   On: 09/11/2013 13:31    Assessment/Plan: Doing well from surgical standpoint. Pt understands expectation for  long rehabilitation.   LOS: 2 days  Per DrStern, d/c hemovac; transfer to CIR. SNF likely after CIR. Needs office f/u with Dr. Vertell Limber in 4 weeks.   [name] 09/13/2013, 7:03 AM
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
## 2013-09-13 NOTE — Clinical Social Work Note (Signed)
Clinical Social Worker spoke with inpatient rehab admissions coordinator who states that patient has been accepted to rehab and will transfer today pending medical readiness.  Patient wife at bedside and agreeable with plan.  Patient wife understanding that patient may still require SNF after inpatient rehab, and requests no return to [facility].    Clinical Social Worker will sign off for now as social work intervention is no longer needed. Please consult [place] again if new need arises.  Barbette Or, [place]

## 2013-09-13 NOTE — Progress Notes (Addendum)
I left message for Aaron Edelman to contact me to clarify when pt medically ready to d/c to inpt rehab, today or tomorrow. Wife at bedside with pt and she is aware. I will follow up today after Vertell Limber out of [place]. [phone]  I spoke with Dr. Vertell Limber and he in agreement to admission today. I will arrange. [phone]

## 2013-09-14 ENCOUNTER — Inpatient Hospital Stay (HOSPITAL_COMMUNITY): Payer: Medicare Other | Admitting: Speech Pathology

## 2013-09-14 ENCOUNTER — Inpatient Hospital Stay (HOSPITAL_COMMUNITY): Payer: Medicare Other | Admitting: Physical Therapy

## 2013-09-14 ENCOUNTER — Inpatient Hospital Stay (HOSPITAL_COMMUNITY): Payer: Medicare Other

## 2013-09-14 DIAGNOSIS — N319 Neuromuscular dysfunction of bladder, unspecified: Secondary | ICD-10-CM

## 2013-09-14 DIAGNOSIS — W19XXXA Unspecified fall, initial encounter: Secondary | ICD-10-CM

## 2013-09-14 DIAGNOSIS — S022XXA Fracture of nasal bones, initial encounter for closed fracture: Secondary | ICD-10-CM

## 2013-09-14 DIAGNOSIS — S14121A Central cord syndrome at C1 level of cervical spinal cord, initial encounter: Secondary | ICD-10-CM

## 2013-09-14 DIAGNOSIS — K592 Neurogenic bowel, not elsewhere classified: Secondary | ICD-10-CM

## 2013-09-14 LAB — COMPREHENSIVE METABOLIC PANEL
ALBUMIN: 2.2 g/dL — AB (ref 3.5–5.2)
ALK PHOS: 67 U/L (ref 39–117)
ALT: 17 U/L (ref 0–53)
AST: 12 U/L (ref 0–37)
BILIRUBIN TOTAL: 0.3 mg/dL (ref 0.3–1.2)
BUN: 41 mg/dL — ABNORMAL HIGH (ref 6–23)
CHLORIDE: 100 meq/L (ref 96–112)
CO2: 20 mEq/L (ref 19–32)
Calcium: 7.9 mg/dL — ABNORMAL LOW (ref 8.4–10.5)
Creatinine, Ser: 0.9 mg/dL (ref 0.50–1.35)
GFR calc Af Amer: >90 mL/min (ref >90)
GFR calc non Af Amer: 78 mL/min — ABNORMAL LOW (ref >90)
Glucose, Bld: 208 mg/dL — ABNORMAL HIGH (ref 70–99)
POTASSIUM: 6.3 meq/L — AB (ref 3.7–5.3)
SODIUM: 130 meq/L — AB (ref 137–147)
Total Protein: 6.2 g/dL (ref 6.0–8.3)

## 2013-09-14 LAB — CBC WITH DIFFERENTIAL/PLATELET
BASOS ABS: 0 10*3/uL (ref 0.0–0.1)
BASOS PCT: 0 % (ref 0–1)
Eosinophils Absolute: 0 10*3/uL (ref 0.0–0.7)
Eosinophils Relative: 0 % (ref 0–5)
HCT: 30.1 % — ABNORMAL LOW (ref 39.0–52.0)
Hemoglobin: 10.2 g/dL — ABNORMAL LOW (ref 13.0–17.0)
Lymphocytes Relative: 9 % — ABNORMAL LOW (ref 12–46)
Lymphs Abs: 1.6 10*3/uL (ref 0.7–4.0)
MCH: 31.9 pg (ref 26.0–34.0)
MCHC: 33.9 g/dL (ref 30.0–36.0)
MCV: 94.1 fL (ref 78.0–100.0)
Monocytes Absolute: 2.7 10*3/uL — ABNORMAL HIGH (ref 0.1–1.0)
Monocytes Relative: 15 % — ABNORMAL HIGH (ref 3–12)
NEUTROS ABS: 13.9 10*3/uL — AB (ref 1.7–7.7)
NEUTROS PCT: 76 % (ref 43–77)
Platelets: 48 10*3/uL — ABNORMAL LOW (ref 150–400)
RBC: 3.2 MIL/uL — ABNORMAL LOW (ref 4.22–5.81)
RDW: 14 % (ref 11.5–15.5)
WBC: 18.2 10*3/uL — ABNORMAL HIGH (ref 4.0–10.5)

## 2013-09-14 LAB — GLUCOSE, CAPILLARY
Glucose-Capillary: 202 mg/dL — ABNORMAL HIGH (ref 70–99)
Glucose-Capillary: 258 mg/dL — ABNORMAL HIGH (ref 70–99)
Glucose-Capillary: 224 mg/dL — ABNORMAL HIGH (ref 70–99)
Glucose-Capillary: 222 mg/dL — ABNORMAL HIGH (ref 70–99)

## 2013-09-14 MED ORDER — SODIUM POLYSTYRENE SULFONATE 15 GM/60ML PO SUSP
30.0000 g | Freq: Once | ORAL | Status: AC
  Administered 2013-09-14: 30 g via ORAL
  Filled 2013-09-14: qty 120
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
## 2013-09-14 NOTE — Progress Notes (Signed)
Subjective/Complaints: Pt states last dose of revlimid was 2/12 No problems overnite Review of Systems - Negative except weakness in all 4 limbs  Objective: Vital Signs: Blood pressure 113/41, pulse 63, temperature 97.9 F (36.6 C), temperature source Oral, resp. rate 20, height $RemoveBe'5\' 8"'ObUlDlFBd$  (1.727 m), weight 86.4 kg (190 lb 7.6 oz), SpO2 98.00%. No results found. Results for orders placed during the hospital encounter of 09/13/13 (from the past 72 hour(s))  GLUCOSE, CAPILLARY     Status: Abnormal   Collection Time    09/13/13  4:42 PM      Result Value Ref Range   Glucose-Capillary 199 (*) 70 - 99 mg/dL   Comment 1 Notify RN    GLUCOSE, CAPILLARY     Status: Abnormal   Collection Time    09/13/13  9:17 PM      Result Value Ref Range   Glucose-Capillary 268 (*) 70 - 99 mg/dL  CBC WITH DIFFERENTIAL     Status: Abnormal   Collection Time    09/14/13  5:02 AM      Result Value Ref Range   WBC 18.2 (*) 4.0 - 10.5 K/uL   RBC 3.20 (*) 4.22 - 5.81 MIL/uL   Hemoglobin 10.2 (*) 13.0 - 17.0 g/dL   HCT 30.1 (*) 39.0 - 52.0 %   MCV 94.1  78.0 - 100.0 fL   MCH 31.9  26.0 - 34.0 pg   MCHC 33.9  30.0 - 36.0 g/dL   RDW 14.0  11.5 - 15.5 %   Platelets 48 (*) 150 - 400 K/uL   Comment: CONSISTENT WITH PREVIOUS RESULT   Neutrophils Relative % 76  43 - 77 %   Neutro Abs 13.9 (*) 1.7 - 7.7 K/uL   Lymphocytes Relative 9 (*) 12 - 46 %   Lymphs Abs 1.6  0.7 - 4.0 K/uL   Monocytes Relative 15 (*) 3 - 12 %   Monocytes Absolute 2.7 (*) 0.1 - 1.0 K/uL   Eosinophils Relative 0  0 - 5 %   Eosinophils Absolute 0.0  0.0 - 0.7 K/uL   Basophils Relative 0  0 - 1 %   Basophils Absolute 0.0  0.0 - 0.1 K/uL  COMPREHENSIVE METABOLIC PANEL     Status: Abnormal   Collection Time    09/14/13  5:02 AM      Result Value Ref Range   Sodium 130 (*) 137 - 147 mEq/L   Potassium 6.3 (*) 3.7 - 5.3 mEq/L   Chloride 100  96 - 112 mEq/L   CO2 20  19 - 32 mEq/L   Glucose, Bld 208 (*) 70 - 99 mg/dL   BUN 41 (*) 6 - 23  mg/dL   Creatinine, Ser 0.90  0.50 - 1.35 mg/dL   Calcium 7.9 (*) 8.4 - 10.5 mg/dL   Total Protein 6.2  6.0 - 8.3 g/dL   Albumin 2.2 (*) 3.5 - 5.2 g/dL   AST 12  0 - 37 U/L   ALT 17  0 - 53 U/L   Alkaline Phosphatase 67  39 - 117 U/L   Total Bilirubin 0.3  0.3 - 1.2 mg/dL   GFR calc non Af Amer 78 (*) >90 mL/min   GFR calc Af Amer >90  >90 mL/min   Comment: (NOTE)     The eGFR has been calculated using the CKD EPI equation.     This calculation has not been validated in all clinical situations.     eGFR's persistently <90  mL/min signify possible Chronic Kidney     Disease.  GLUCOSE, CAPILLARY     Status: Abnormal   Collection Time    09/14/13  7:31 AM      Result Value Ref Range   Glucose-Capillary 202 (*) 70 - 99 mg/dL   Comment 1 Notify RN        Constitutional: He is oriented to person, place, and time.  HENT: tongue midline, Multiple bruises to the face and forehead  Eyes: EOM are normal.  Neck:  Cervical collar in place  Cardiovascular: Normal rate and regular rhythm.  Respiratory: Effort normal and breath sounds normal. No respiratory distress.  GI: Soft. Bowel sounds are normal. He exhibits no distension.  Neurological: He is alert and oriented to person, place, and time.  Skin:  Left scalp staples intact motor strength is 0/5 in the right deltoid, bicep, tricep, grip  Motor strength is trace in the finger flexors on the left otherwise 0 in the deltoid bicep triceps  Right lower extremity 2 minus hip knee extensor synergy 0/5 at the ankle dorsiflexor plantar flexors  Left lower extremity 2 minus hip flexor 3 minus knee extensor 2 minus plantar flexor  Sensory intact to light touch in the right upper and left upper extremity  Absent to light touch right L5 and right S1 dermatomes  Intact to light touch left lower extremity    Assessment/Plan: 1. Functional deficits secondary to spastic tetraplegia central cord which require 3+ hours per day of interdisciplinary  therapy in a comprehensive inpatient rehab setting. Physiatrist is providing close team supervision and 24 hour management of active medical problems listed below. Physiatrist and rehab team continue to assess barriers to discharge/monitor patient progress toward functional and medical goals. FIM:                                  Medical Problem List and Plan:  1. 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 Status post C2-C4 fusion  2. DVT Prophylaxis/Anticoagulation: SCDs. Check vascular study  3. Pain Management/chronic back pain t: 300 mg at bedtime, Hydrocodone and Robaxin as needed. Monitor with increased mobility  4. Neuropsych: This patient is capable of making decisions on his own behalf.  5. History of CAD with angioplasty. No chest pain or shortness of breath  6. History of CVA. Plavix currently on hold discuss resuming neurosurgery  7. Diabetes mellitus with peripheral neuropathy. Monitor blood sugars closely while on Decadron taper. Lantus insulin 23 units twice a day. Check blood sugars a.c. and at bedtime  8. Hypertension. Lasix 20 mg daily, Avapro 75 mg daily, Toprol 50 mg daily, Norvasc 5 mg daily. Monitor with increased mobility  9. Hyperlipidemia. Zocor  10. History of multiple myeloma. Followup hematology oncology Dr. Alvy Bimler as needed,clarify if Revlimid needs to be restarted, can lower immune competancy   LOS (Days) 1 A FACE TO [place] E 09/14/2013, 8:46 AM

## 2013-09-14 NOTE — Progress Notes (Signed)
Occupational Therapy Session Note  Patient Details  Name: John Carter MRN: [id number] Date of Birth: Jan 16, 1930  Today's Date: 09/14/2013 Time: 0900-0955 Time Calculation (min): 55 min   Skilled Therapeutic Interventions/Progress Updates:    Patient seen this am for OT intervention to address sitting balance (static) and postural control as needed for basic transfer bed to wheelchair.  Patient required total assist +2 for modified squat pivot transfer from bed to wheelchair.  Patient unable to activate legs during transfer to assist, although able to report sensory awareness of feet on floor.  Focused on patient beginning to actively flex trunk to shift weight forward as needed for transfer.  Patient able to verbalize that weight shift forward was frightening as he has limited ability to control the weight shift.   Patient able to discern asymmetries in his posture which will be very helpful in proper positioning to prevent skin breakdown.   Also addressed wheelchair positioning during this session. Patient transferred into tilt in space wheelchair, and fitted with leg rests, addressed height of adjustable arm rests and lap tray to provide adequate upper extremity support, elevated surface to help alleviate swelling in both hands (left > right) Wife Inez Catalina asked about CPAP.  Patient utilized CPAP at home each night, and she wondered if she should bring in his unit.  Patient indicated he did not think he needed it at this time.  Spoke with RN to make her aware that patient and wife have questions regarding CPAP.  She indicated that monitoring O2 sats may be helpful in determining needs.  Therapy Documentation Precautions:  Restrictions Weight Bearing Restrictions: No Pain:  No pain indicated      See FIM for current functional status  Therapy/Group: Individual Therapy  Mariah Milling 09/14/2013, 10:24 AM

## 2013-09-14 NOTE — Progress Notes (Signed)
Contacted oncology Re: chemotherapy. No revlimid until pt D/Ced from hospital

## 2013-09-14 NOTE — Progress Notes (Signed)
Patient information reviewed and entered into eRehab system by Ghadeer Kastelic, RN, CRRN, PPS Coordinator.  Information including medical coding and functional independence measure will be reviewed and updated through discharge.     Per nursing patient was given "Data Collection Information Summary for Patients in Inpatient Rehabilitation Facilities with attached "Privacy Act Statement-Health Care Records" upon admission.  
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
## 2013-09-14 NOTE — Progress Notes (Addendum)
Inpatient Diabetes Program Recommendations  AACE/ADA: New Consensus Statement on Inpatient Glycemic Control (2013)  Target Ranges:  Prepandial:   less than 140 mg/dL      Peak postprandial:   less than 180 mg/dL (1-2 hours)      Critically ill patients:  140 - 180 mg/dL    Results for John Carter, John Carter (MRN [id number]) as of 09/14/2013 09:53  Ref. Range 09/13/2013 08:30 09/13/2013 12:34 09/13/2013 16:42 09/13/2013 21:17  Glucose-Capillary Latest Range: 70-99 mg/dL 193 (H) 273 (H) 199 (H) 268 (H)    Results for John Carter, John Carter (MRN [id number]) as of 09/14/2013 09:53  Ref. Range 09/14/2013 07:31  Glucose-Capillary Latest Range: 70-99 mg/dL 202 (H)    **Noted patient moved to Inpatient Rehab.  May still need SNF placement after rehab stay per MD notes.  **Patient still getting Decadron which is driving CBGs upward.   **MD- Please consider the following in-hospital insulin adjustments:  1. Increase Lantus to 25 units bid (curently ordered as Lantus 23 units bid) 2. Add Novolog meal coverage- Novolog 4 units tid with meals (patient eating 75-85% of meals)   Will follow. Wyn Quaker RN, MSN, CDE Diabetes Coordinator Inpatient Diabetes Program Team Pager: [phone] (8a-10p)
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
## 2013-09-15 ENCOUNTER — Inpatient Hospital Stay (HOSPITAL_COMMUNITY): Payer: Medicare Other | Admitting: Speech Pathology

## 2013-09-15 ENCOUNTER — Inpatient Hospital Stay (HOSPITAL_COMMUNITY): Payer: Medicare Other | Admitting: Occupational Therapy

## 2013-09-15 ENCOUNTER — Inpatient Hospital Stay (HOSPITAL_COMMUNITY): Payer: Medicare Other | Admitting: Physical Therapy

## 2013-09-15 DIAGNOSIS — D649 Anemia, unspecified: Secondary | ICD-10-CM

## 2013-09-15 DIAGNOSIS — G8389 Other specified paralytic syndromes: Secondary | ICD-10-CM

## 2013-09-15 LAB — GLUCOSE, CAPILLARY
GLUCOSE-CAPILLARY: 197 mg/dL — AB (ref 70–99)
GLUCOSE-CAPILLARY: 189 mg/dL — AB (ref 70–99)
Glucose-Capillary: 210 mg/dL — ABNORMAL HIGH (ref 70–99)
Glucose-Capillary: 274 mg/dL — ABNORMAL HIGH (ref 70–99)

## 2013-09-15 LAB — BASIC METABOLIC PANEL
BUN: 40 mg/dL — AB (ref 6–23)
CALCIUM: 7.7 mg/dL — AB (ref 8.4–10.5)
CO2: 23 mEq/L (ref 19–32)
Chloride: 97 mEq/L (ref 96–112)
Creatinine, Ser: 0.92 mg/dL (ref 0.50–1.35)
GFR calc Af Amer: 89 mL/min — ABNORMAL LOW (ref >90)
GFR, EST NON AFRICAN AMERICAN: 77 mL/min — AB (ref >90)
GLUCOSE: 221 mg/dL — AB (ref 70–99)
Potassium: 5.2 mEq/L (ref 3.7–5.3)
SODIUM: 131 meq/L — AB (ref 137–147)

## 2013-09-15 MED ORDER — INSULIN ASPART 100 UNIT/ML ~~LOC~~ SOLN
0.0000 [IU] | Freq: Every day | SUBCUTANEOUS | Status: DC
  Administered 2013-09-15 – 2013-09-16 (×2): 3 [IU] via SUBCUTANEOUS
  Administered 2013-09-17 – 2013-10-06 (×8): 2 [IU] via SUBCUTANEOUS
  Administered 2013-10-07: 21:00:00 via SUBCUTANEOUS
  Administered 2013-10-08: 2 [IU] via SUBCUTANEOUS

## 2013-09-15 MED ORDER — INSULIN ASPART 100 UNIT/ML ~~LOC~~ SOLN
0.0000 [IU] | Freq: Three times a day (TID) | SUBCUTANEOUS | Status: DC
  Administered 2013-09-15: 4 [IU] via SUBCUTANEOUS
  Administered 2013-09-15 – 2013-09-16 (×4): 7 [IU] via SUBCUTANEOUS
  Administered 2013-09-17: 11 [IU] via SUBCUTANEOUS
  Administered 2013-09-17 – 2013-09-18 (×3): 4 [IU] via SUBCUTANEOUS
  Administered 2013-09-18: 11 [IU] via SUBCUTANEOUS
  Administered 2013-09-18: 7 [IU] via SUBCUTANEOUS
  Administered 2013-09-19: 4 [IU] via SUBCUTANEOUS
  Administered 2013-09-19: 7 [IU] via SUBCUTANEOUS
  Administered 2013-09-19: 11 [IU] via SUBCUTANEOUS
  Administered 2013-09-20 – 2013-09-21 (×4): 4 [IU] via SUBCUTANEOUS
  Administered 2013-09-21: 7 [IU] via SUBCUTANEOUS
  Administered 2013-09-21: 4 [IU] via SUBCUTANEOUS
  Administered 2013-09-22: 7 [IU] via SUBCUTANEOUS
  Administered 2013-09-22: 4 [IU] via SUBCUTANEOUS
  Administered 2013-09-22: 3 [IU] via SUBCUTANEOUS
  Administered 2013-09-23 – 2013-09-24 (×4): 4 [IU] via SUBCUTANEOUS
  Administered 2013-09-24: 3 [IU] via SUBCUTANEOUS
  Administered 2013-09-25: 4 [IU] via SUBCUTANEOUS
  Administered 2013-09-25: 7 [IU] via SUBCUTANEOUS
  Administered 2013-09-25: 4 [IU] via SUBCUTANEOUS
  Administered 2013-09-26 (×3): 3 [IU] via SUBCUTANEOUS
  Administered 2013-09-27: 7 [IU] via SUBCUTANEOUS
  Administered 2013-09-27: 3 [IU] via SUBCUTANEOUS
  Administered 2013-09-28: 7 [IU] via SUBCUTANEOUS
  Administered 2013-09-28 (×2): 4 [IU] via SUBCUTANEOUS
  Administered 2013-09-29: 7 [IU] via SUBCUTANEOUS
  Administered 2013-09-29: 3 [IU] via SUBCUTANEOUS
  Administered 2013-09-29 – 2013-10-01 (×7): 4 [IU] via SUBCUTANEOUS
  Administered 2013-10-02: 7 [IU] via SUBCUTANEOUS
  Administered 2013-10-02: 3 [IU] via SUBCUTANEOUS
  Administered 2013-10-03 (×3): 4 [IU] via SUBCUTANEOUS
  Administered 2013-10-04: 3 [IU] via SUBCUTANEOUS
  Administered 2013-10-04: 7 [IU] via SUBCUTANEOUS
  Administered 2013-10-04 – 2013-10-05 (×3): 3 [IU] via SUBCUTANEOUS
  Administered 2013-10-05: 7 [IU] via SUBCUTANEOUS
  Administered 2013-10-06 – 2013-10-07 (×5): 3 [IU] via SUBCUTANEOUS
  Administered 2013-10-07 – 2013-10-08 (×2): 4 [IU] via SUBCUTANEOUS
  Administered 2013-10-08: 3 [IU] via SUBCUTANEOUS
  Administered 2013-10-08: 7 [IU] via SUBCUTANEOUS
  Administered 2013-10-09: 3 [IU] via SUBCUTANEOUS
  Administered 2013-10-09: 4 [IU] via SUBCUTANEOUS

## 2013-09-15 NOTE — Progress Notes (Signed)
Subjective/Complaints: Patient is eating without with his wife this morning. He has no specific new complaints. He denies pain.   Objective: Vital Signs: Blood pressure 131/54, pulse 60, temperature 97.9 F (36.6 C), temperature source Oral, resp. rate 18, height $RemoveBe'5\' 8"'KZhkwYmBz$  (1.727 m), weight 190 lb 7.6 oz (86.4 kg), SpO2 99.00%.  Elderly male in no acute distress. Chest clear to auscultation. Cardiac exam S1-S2 are regular. Abdominal exam overweight, active bowel sounds.  Assessment/Plan: 1. Functional deficits secondary to spastic tetraplegia central cord Medical Problem List and Plan:  1. 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 Status post C2-C4 fusion  2. DVT Prophylaxis/Anticoagulation: SCDs.  3. Pain Management/chronic back pain  4. Neuropsych: This patient is capable of making decisions on his own behalf.  5. History of CAD with angioplasty. No chest pain or shortness of breath  6. History of CVA. Plavix currently on hold discuss resuming neurosurgery  7. Diabetes mellitus with peripheral neuropathy. Monitor blood sugars closely while on Decadron taper. Lantus insulin 23 units twice a day. Check blood sugars a.c. and at bedtime  CBG (last 3)   Recent Labs  09/14/13 1610 09/14/13 2057 09/15/13 0737  GLUCAP 224* 222* 197*   Not controlled. Will at mealtime insulin for now.  8. Hypertension.  BP Readings from Last 4 Encounters:  09/15/13 131/54  09/13/13 112/48  09/13/13 112/48  09/05/13 124/58    9. Hyperlipidemia. Zocor  10. History of multiple myeloma. Followup hematology oncology Dr. Alvy Bimler  reviewed previous note documenting that Revlimid will be resumed after discharge.   LOS (Days) 2 A [place] 09/15/2013, 10:43 AM

## 2013-09-15 NOTE — Progress Notes (Signed)
Occupational Therapy Session Note  Patient Details  Name: John Carter MRN: [id number] Date of Birth: 05/26/1930  Today's Date: 09/15/2013   AM Session:  Time: 4696-2952 Time Calculation (min): 45 min Patient stated he receives night bathes with the nursing staff.  Upon approach for his am session, patient was sitting up in his w/c and this clinician offered to help him dress.  Patient stated he was too tired to attempt another sliding board transfer (completed earlier with PT and rehab tech with  +2 assist)  However, after this clinician and rehab assisted patient to bed via [employer], he stated he had not slept much the night before and did not want to dress and too tired to do anything further.  This clinician and rehab tech donned a brief onto patient and positioned in bed on his right side for pressure relief.   Patient practiced using the call bell with his shoulders, heels and ankles.  He was left in the care of his wife with bed alarm placed and call bell at his left foot where he demonstrated he could alert the call bell.    At end of the session, patient's 2 nephews and neices arrived to visit.       PM Session: 1430 -1530 Time calculation= 60 minutes Upon approach for therapy, wife stated she would run to [employer] to look for stretchy, type shorts and pants for her husband rather than have him try to wear the dress pants that he has here in his room.   Patient c/o soreness in area that he described as "top of my shoulder blades" and greater on the right than left.  This clinician consulted with RN to see if Shaune Leeks may be helpful to relieve soreness.  RN to check with MD for order if suggested.  Skilled OT as follows:  Bilateral scapular mobilization Passive and Active Assistive,  Elbow extension/flexion, wrist supination and stretching, wrist extension flexion, digital  ROM.  Patient with very little UE movement except some in bilateral shoulder elevation and depression.  Session  concluded with positioning patient on his right side for pressure relief and practicing using his call bell. Patient practiced pressing call bell with his left shoulder, left hand and left ankle, leg and foot.  Patient was able to demonstrate pressing call bell via his left heel.  Call bell left there.       Patient was scheduled for next therapy session at the conclusion of this OT session.   Therapy Documentation Precautions:  Precautions Precautions: Fall;Cervical Precaution Comments: Central cord presentation - with sitting pt has some spasms causing quick forward lean so be cautious during sitting balance activities.  Required Braces or Orthoses: Cervical Brace Cervical Brace: Hard collar;At all times Restrictions Weight Bearing Restrictions: No  Pain: denied but stated "sore on the top of my shoulder blades"  Patient did not rate the level and denied need for meds for relief.  RN to check with MD to see if Shaune Leeks could be ordered See FIM for current functional status  Therapy/Group: Individual Therapy  John Carter 09/15/2013, 3:50 PM

## 2013-09-15 NOTE — Discharge Summary (Addendum)
Physician Discharge Summary  Patient ID: John Carter MRN: [id number] DOB/AGE: 09-22-1929 80 y.o.  Admit date: 09/11/2013 Discharge date: 09/13/2013  Admission Diagnoses:Cervical spinal cord injury with quadriparesis due to C 34 stenosis and hypermobility in setting of ankylosing spondylitis after a fall  Discharge Diagnoses: Cervical spinal cord injury with quadriparesis due to C 34 stenosis and hypermobility in setting of ankylosing spondylitis after a fall  Active Problems:   C3 spinal cord injury   Discharged Condition: fair  Hospital Course: Patient readmitted after initial injury for surgical decompression and stabilization of cervical spine, which involved decompression with posterior stabilization of C 2 - C 4 levels.  Patient was observed in ICU postoperatively and had a stable neurologic exam compared to preop.  He was transferred to the Rehab Service for inpatient rehabilitation for severe spinal cord injury.  Consults: rehabilitation medicine  Significant Diagnostic Studies: None  Treatments: surgery: decompression with posterior stabilization of C 2 - C 4 levels  Discharge Exam: Blood pressure 112/48, pulse 66, temperature 97.9 F (36.6 C), temperature source Oral, resp. rate 12, height 5\' 8"  (1.727 m), weight 184 lb 4.9 oz (83.6 kg), SpO2 97.00%. Neurologic: Alert and oriented X 3, severe weakness of both arms and antigravity movement of both legs with diminished sensation of upper and lower extremities. Wound:CDI   Disposition: Rehab   Future Appointments Provider Department Dept Phone   09/15/2013 2:30 PM [place], Phil [place] A [phone]   09/15/2013 3:45 PM Tomasita Crumble, [place] A [phone]   09/17/2013 8:00 AM Buzzy Han, [place] A [phone]   09/17/2013 8:30 AM [place] A [phone]   09/17/2013 9:15 AM Salome Spotted, [place] A [phone]   09/17/2013 2:45 PM [place] A [phone]   09/17/2013 3:30 PM [place] A [phone]   09/24/2013 12:15 PM [facility] [phone]   09/24/2013 12:45 PM Heath Lark, MD [facility] [phone]   09/24/2013 1:45 PM [facility] [phone]       Medication List         amLODipine 5 MG tablet  Commonly known as:  NORVASC  Take 5 mg by mouth daily.     candesartan 32 MG tablet  Commonly known as:  ATACAND  Take 32 mg by mouth daily.     dexamethasone 4 MG tablet  Commonly known as:  DECADRON  - 8mg  PO qD x 2d then  - 6mg  PO qD x 2d then  - 4mg  PO qD x2d then  - 2mg  PO qD x2d then  - 2mg  PO every Tuesday     DSS 100 MG Caps  Take 100 mg by mouth 2 (two) times daily.     flunisolide 29 MCG/ACT (0.025%) nasal spray  Commonly known as:  NASAREL  Place 2 sprays into the nose at bedtime. Dose is for each nostril.     furosemide 20 MG tablet  Commonly known as:  LASIX  Take 20 mg by mouth daily.     gabapentin 300 MG capsule  Commonly known as:  NEURONTIN  Take 300 mg by mouth  at bedtime.     insulin aspart 100 UNIT/ML injection  Commonly known as:  novoLOG  Inject 0-20 Units into the skin See admin instructions. Sliding scale 0-120= 0 units, 121-150=3 units, 151-200= 4 units, 201-250=7units, 251-300=11 units, 301-350=15 units, 351-400= 20 units     insulin glargine 100 UNIT/ML injection  Commonly known as:  LANTUS  Inject 23 Units into the skin 2 (two) times daily.     metoprolol succinate 50 MG 24 hr tablet  Commonly known as:  TOPROL-XL  Take 50 mg by mouth every morning.     multivitamins ther. w/minerals Tabs  tablet  Take 1 tablet by mouth daily. Centrum silver     nitroGLYCERIN 0.4 MG SL tablet  Commonly known as:  NITROSTAT  Place 0.4 mg under the tongue every 5 (five) minutes as needed for chest pain.     omeprazole 20 MG capsule  Commonly known as:  PRILOSEC  Take 20 mg by mouth daily.     polyethylene glycol packet  Commonly known as:  MIRALAX / GLYCOLAX  Take 17 g by mouth daily.     potassium chloride SA 20 MEQ tablet  Commonly known as:  K-DUR,KLOR-CON  Take 20 mEq by mouth 2 (two) times daily.     simvastatin 20 MG tablet  Commonly known as:  ZOCOR  Take 20 mg by mouth at bedtime.     traMADol 50 MG tablet  Commonly known as:  ULTRAM  Take 1-2 tablets (50-100 mg total) by mouth every 6 (six) hours as needed (50mg  for mild pain, 75mg  for moderate pain, 100mg  for severe pain).     VIACTIV PO  Take 2 tablets by mouth daily.     Vitamin D (Ergocalciferol) 50000 UNITS Caps capsule  Commonly known as:  DRISDOL  Take 50,000 Units by mouth every 14 (fourteen) days. takes on thursdays         Signed: Peggyann Shoals, MD 09/15/2013, 12:01 PM

## 2013-09-15 NOTE — Progress Notes (Signed)
Physical Therapy Session Note  Patient Details  Name: John Carter MRN: [id number] Date of Birth: 14-May-1930  Today's Date: 09/15/2013 Time: 3254-9826 Time Calculation (min): 45 min  Short Term Goals: Week 1:  PT Short Term Goal 1 (Week 1): Pt will transfer bed<> wheelchair total assist +2 (pt = 15%) PT Short Term Goal 2 (Week 1): Pt will maintain static sitting balance x 1 min with mod assist PT Short Term Goal 3 (Week 1): Pt will direct w/c and sliding board set-up with mod verbal cues.   Therapy Documentation Precautions:  Precautions Precautions: Fall;Cervical Precaution Comments: Central cord presentation - with sitting pt has some spasms causing quick forward lean so be cautious during sitting balance activities.  Required Braces or Orthoses: Cervical Brace Cervical Brace: Hard collar;At all times Restrictions Weight Bearing Restrictions: No Pain: denies pain  Therapeutic Activity:(15') Bed Mobility turning and positioning limbs with pillow unloading of pressure points with Total assist Therapeutic Exercise:(30') B LE's in supine with stretching, ROM, A/AROM.  Therapy/Group: Individual Therapy  [name] 09/15/2013, 5:08 PM

## 2013-09-15 NOTE — Plan of Care (Signed)
Problem: SCI BLADDER ELIMINATION Goal: RH STG MANAGE BLADDER WITH EQUIPMENT WITH ASSISTANCE STG Manage Bladder With Equipment With mod Assistance  Outcome: Not Progressing Foley in place for retention.

## 2013-09-15 NOTE — Progress Notes (Signed)
Speech Language Pathology Daily Session Note  Patient Details  Name: John Carter MRN: [id number] Date of Birth: 01/31/1930  Today's Date: 09/15/2013 Time: 0940-1010 Time Calculation (min): 30 min  Short Term Goals: Week 1: SLP Short Term Goal 1 (Week 1): Pt will recall swallowing compensatory strategies with supervision question cues.  SLP Short Term Goal 2 (Week 1): Pt will consume current diet with minimal overt s/s of aspiration with supervision verbal cues for utilization of swallowing compensatory strategies.   Skilled Therapeutic Interventions: Therapeutic intervention complete with short term goals addressed.  The patient consumed DIII textures and thin liquids via straw and cup.  He had no s/s of aspiration with DIII.  Initial straw sip elicited cough, but he was able to clear. Thin liquids were then given via cup, and no cough elicited.  Straw sips were again tried, with patient cued to take small straw sip.  No s/s of aspiration with multiple trials of straw sip rest of session.  Continue with current plan of care.    FIM:  FIM - Eating Eating Activity: 1: Helper feeds patient  Pain Pain Assessment Pain Assessment: No/denies pain  Therapy/Group: Individual Therapy  John Carter 09/15/2013, 12:14 PM

## 2013-09-15 NOTE — Progress Notes (Signed)
Physical Therapy Session Note  Patient Details  Name: John Carter MRN: [id number] Date of Birth: 1929/11/19  Today's Date: 09/15/2013 Time: 0830-0930 Time Calculation (min): 60 min  Short Term Goals: Week 1:  PT Short Term Goal 1 (Week 1): Pt will transfer bed<> wheelchair total assist +2 (pt = 15%) PT Short Term Goal 2 (Week 1): Pt will maintain static sitting balance x 1 min with mod assist PT Short Term Goal 3 (Week 1): Pt will direct w/c and sliding board set-up with mod verbal cues.   Therapy Documentation Precautions:  Precautions Precautions: Fall;Cervical Precaution Comments: Central cord presentation - with sitting pt has some spasms causing quick forward lean so be cautious during sitting balance activities.  Required Braces or Orthoses: Cervical Brace Cervical Brace: Hard collar;At all times Restrictions Weight Bearing Restrictions: No Pain: Pain Assessment Pain Assessment: No/denies pain  Therapeutic Activities:(45') Bed mobility rolling, scoot to HOB and L sidelying to sitting EOB with scoot to EOB Total-assist, sitting EOB with feet on floor x 5 minutes with static sitting balance needing Mod-assist, Transfer: slideboard bed->tilt-in-space w/c with Total assist x 2 and for repositioning. Wife present throughout tx session and verbalizes understanding that nursing to assist with changing pressure relief q 30 minutes in chair. Therapeutic Exercise:(15') Stretching and ROM for B LE's while in supine, A/AAROM B LE's in supine  Therapy/Group: Individual Therapy  [name] 09/15/2013, 12:13 PM

## 2013-09-16 DIAGNOSIS — M7989 Other specified soft tissue disorders: Secondary | ICD-10-CM

## 2013-09-16 LAB — GLUCOSE, CAPILLARY
GLUCOSE-CAPILLARY: 202 mg/dL — AB (ref 70–99)
Glucose-Capillary: 243 mg/dL — ABNORMAL HIGH (ref 70–99)
Glucose-Capillary: 212 mg/dL — ABNORMAL HIGH (ref 70–99)
Glucose-Capillary: 260 mg/dL — ABNORMAL HIGH (ref 70–99)

## 2013-09-16 MED ORDER — INSULIN GLARGINE 100 UNIT/ML ~~LOC~~ SOLN
26.0000 [IU] | Freq: Two times a day (BID) | SUBCUTANEOUS | Status: DC
  Administered 2013-09-16 – 2013-09-19 (×7): 26 [IU] via SUBCUTANEOUS
  Filled 2013-09-16 (×9): qty 0.26

## 2013-09-16 NOTE — Progress Notes (Signed)
Subjective/Complaints: No complaints- has several questions about his prognosis   Objective: Vital Signs: Blood pressure 126/58, pulse 66, temperature 98.7 F (37.1 C), temperature source Oral, resp. rate 17, height $RemoveBe'5\' 8"'mqprpxByy$  (1.727 m), weight 190 lb 7.6 oz (86.4 kg), SpO2 98.00%.  Elderly male in no acute distress. Chest clear to auscultation. Cardiac exam S1-S2 are regular. Abdominal exam overweight, active bowel sounds.  Assessment/Plan: 1. Functional deficits secondary to spastic tetraplegia central cord Medical Problem List and Plan:  1. 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 Status post C2-C4 fusion  2. DVT Prophylaxis/Anticoagulation: SCDs.  3. Pain Management/chronic back pain  4. Neuropsych: This patient is capable of making decisions on his own behalf.  5. History of CAD with angioplasty. No chest pain or shortness of breath  6. History of CVA. Plavix currently on hold discuss resuming neurosurgery  7. Diabetes mellitus with peripheral neuropathy. Monitor blood sugars closely while on Decadron taper. Lantus insulin 23 units twice a day. Check blood sugars a.c. and at bedtime  CBG (last 3)   Recent Labs  09/15/13 1700 09/15/13 2120 09/16/13 0739  GLUCAP 189* 274* 202*   Not controlled. Increased insulin yesterday at mealtime Will increase lantus today  8. Hypertension.  BP Readings from Last 4 Encounters:  09/16/13 126/58  09/13/13 112/48  09/13/13 112/48  09/05/13 124/58   9. Hyperlipidemia. Zocor  10. History of multiple myeloma. Followup hematology oncology Dr. Alvy Bimler  reviewed previous note documenting that Revlimid will be resumed after discharge.   LOS (Days) 3 A FACE TO FACE EVALUATION WAS PERFORMED  Samone Guhl HENRY 09/16/2013, 9:40 AM

## 2013-09-16 NOTE — Progress Notes (Signed)
VASCULAR LAB PRELIMINARY  PRELIMINARY  PRELIMINARY  PRELIMINARY  Bilateral lower extremity venous Dopplers completed.    Preliminary report:  There is no DVT or SVT noted in the bilateral lower extremities.  Logan Vegh, RVT 09/16/2013, 3:45 PM

## 2013-09-17 ENCOUNTER — Inpatient Hospital Stay (HOSPITAL_COMMUNITY): Payer: Medicare Other

## 2013-09-17 ENCOUNTER — Encounter (HOSPITAL_COMMUNITY): Payer: Self-pay | Admitting: *Deleted

## 2013-09-17 ENCOUNTER — Inpatient Hospital Stay (HOSPITAL_COMMUNITY): Payer: Medicare Other | Admitting: Physical Therapy

## 2013-09-17 ENCOUNTER — Telehealth: Payer: Self-pay | Admitting: *Deleted

## 2013-09-17 ENCOUNTER — Inpatient Hospital Stay (HOSPITAL_COMMUNITY): Payer: Medicare Other | Admitting: Speech Pathology

## 2013-09-17 DIAGNOSIS — K592 Neurogenic bowel, not elsewhere classified: Secondary | ICD-10-CM

## 2013-09-17 DIAGNOSIS — S14121A Central cord syndrome at C1 level of cervical spinal cord, initial encounter: Secondary | ICD-10-CM

## 2013-09-17 DIAGNOSIS — W19XXXA Unspecified fall, initial encounter: Secondary | ICD-10-CM

## 2013-09-17 DIAGNOSIS — N319 Neuromuscular dysfunction of bladder, unspecified: Secondary | ICD-10-CM

## 2013-09-17 DIAGNOSIS — S022XXA Fracture of nasal bones, initial encounter for closed fracture: Secondary | ICD-10-CM

## 2013-09-17 LAB — BASIC METABOLIC PANEL
BUN: 43 mg/dL — ABNORMAL HIGH (ref 6–23)
CHLORIDE: 97 meq/L (ref 96–112)
CO2: 20 mEq/L (ref 19–32)
Calcium: 7.6 mg/dL — ABNORMAL LOW (ref 8.4–10.5)
Creatinine, Ser: 0.85 mg/dL (ref 0.50–1.35)
GFR calc non Af Amer: 80 mL/min — ABNORMAL LOW (ref >90)
GLUCOSE: 299 mg/dL — AB (ref 70–99)
POTASSIUM: 5.3 meq/L (ref 3.7–5.3)
SODIUM: 130 meq/L — AB (ref 137–147)

## 2013-09-17 LAB — GLUCOSE, CAPILLARY
GLUCOSE-CAPILLARY: 220 mg/dL — AB (ref 70–99)
Glucose-Capillary: 184 mg/dL — ABNORMAL HIGH (ref 70–99)
Glucose-Capillary: 294 mg/dL — ABNORMAL HIGH (ref 70–99)
Glucose-Capillary: 154 mg/dL — ABNORMAL HIGH (ref 70–99)

## 2013-09-17 LAB — CBC
HEMATOCRIT: 31.7 % — AB (ref 39.0–52.0)
HEMOGLOBIN: 11.1 g/dL — AB (ref 13.0–17.0)
MCH: 32.9 pg (ref 26.0–34.0)
MCHC: 35 g/dL (ref 30.0–36.0)
MCV: 94.1 fL (ref 78.0–100.0)
Platelets: 49 10*3/uL — ABNORMAL LOW (ref 150–400)
RBC: 3.37 MIL/uL — AB (ref 4.22–5.81)
RDW: 13.8 % (ref 11.5–15.5)
WBC: 17.3 10*3/uL — ABNORMAL HIGH (ref 4.0–10.5)

## 2013-09-17 MED ORDER — SODIUM CHLORIDE 0.9 % IV SOLN
INTRAVENOUS | Status: DC
  Administered 2013-09-17: 22:00:00 via INTRAVENOUS

## 2013-09-17 NOTE — Telephone Encounter (Signed)
Wife states pt unable to make his appt to see Dr. Alvy Bimler on March 9th.  Says pt at [facility] and she doesn't think he will be able to come for appt any time soon.  She says pt cannot move his arms or legs.  Asked wife to call us when pt will be able to come back and we will make him appt.  She verbalized understanding.

## 2013-09-17 NOTE — Progress Notes (Signed)
Physical Therapy Session Note  Patient Details  Name: John Carter MRN: [id number] Date of Birth: January 26, 1930  Today's Date: 09/17/2013 Time: 1530-1600 Time Calculation (min): 30 min  Short Term Goals: Week 1:  PT Short Term Goal 1 (Week 1): Pt will transfer bed<> wheelchair total assist +2 (pt = 15%) PT Short Term Goal 2 (Week 1): Pt will maintain static sitting balance x 1 min with mod assist PT Short Term Goal 3 (Week 1): Pt will direct w/c and sliding board set-up with mod verbal cues.   Skilled Therapeutic Interventions/Progress Updates:    Session focused on pt rolling in the bed. Pt first rolled to his right then to his left with Total A +2 in order to remove his Maximove pad. Pt then worked on part practice rolling by placing a wedge underneath him and rolling to his R x 5 then placing a wedge underneath him and working on rolling to his L x 5 with the focus being on pt bending his knees, pushing down through his LE's in order to help perform rolling, and working on trunk and hip activation (pt required total A +2 in order to perform this task with pt unable to move his UE's bilaterally and pt able assist more with his LLE than his RLE). At the end of the session pt was repositioned in the bed and a touch pad alarm was placed next to pt's left heel in order for him to be able to call on assistance if needed.    Therapy Documentation Precautions:  Precautions Precautions: Fall;Cervical Precaution Comments: Central cord presentation - with sitting pt has some spasms causing quick forward lean so be cautious during sitting balance activities.  Required Braces or Orthoses: Cervical Brace Cervical Brace: Hard collar;At all times Restrictions Weight Bearing Restrictions: No Pain:  Pt had no c/o pain during the session.   See FIM for current functional status  Therapy/Group: Individual Therapy  Calahan Pak 09/17/2013, 4:07 PM

## 2013-09-17 NOTE — Progress Notes (Signed)
Reviewed and in agreement with treatment provided.  

## 2013-09-17 NOTE — Progress Notes (Signed)
Physical Therapy Note  Patient Details  Name: John Carter MRN: [id number] Date of Birth: [birth year] Today's Date: 09/17/2013 1450-1530, 40 min individual therapy No pain reported  Pt had just returned to bed 15 min earlier. He was quite fatigued; he agreed to bedside tx.  Tx focused on bed mobility scooting laterally with max/total  assist, rolling L and R x 5 (partial range) each with max/total assist, facilitating LEs pushing to initiate rolling.  Therapeutic exercise performed with LE to increase strength for functional mobility: active assistive-  10 x 1 each bil lower trunk rotation, bil hip adduction, bil heel slides, bil hip IR, R ankle PF; active 20 x 1 L ankle pumps, 10 x 1 R hip and knee ext from mass flexed position. John Carter 09/17/2013, 3:21 PM

## 2013-09-17 NOTE — Progress Notes (Signed)
Physical Therapy Session Note  Patient Details  Name: John Carter MRN: [id number] Date of Birth: 1929-09-13  Today's Date: 09/17/2013 Time: 0272-5366 Time Calculation (min): 45 min  Short Term Goals: Week 1:  PT Short Term Goal 1 (Week 1): Pt will transfer bed<> wheelchair total assist +2 (pt = 15%) PT Short Term Goal 2 (Week 1): Pt will maintain static sitting balance x 1 min with mod assist PT Short Term Goal 3 (Week 1): Pt will direct w/c and sliding board set-up with mod verbal cues.   Skilled Therapeutic Interventions/Progress Updates:    Appeared to be in better spirits than Friday however was very fatigued by end of session. Incontinent of bowels at start of treatment, +2 assist to roll and clean good initiation of LEs. PROM bil LEs: hip/knee flexion/extension; ankle planter/dorsiflexion; and hip internal/external rotation. Supine> Sit with total assist, good contribution of LEs to EOB mod assist overall. Sliding board transfer with +2 assist, cues for recruitment of bil LEs. Extended time for proper positioning of bil UEs and LEs.   BP 125/55 sitting EOB - initial reports of dizziness that subsided quickly.   Therapy Documentation Precautions:  Precautions Precautions: Fall;Cervical Precaution Comments: Central cord presentation - with sitting pt has some spasms causing quick forward lean so be cautious during sitting balance activities.  Required Braces or Orthoses: Cervical Brace Cervical Brace: Hard collar;At all times Restrictions Weight Bearing Restrictions: No Pain: No c/o   See FIM for current functional status  Therapy/Group: Individual Therapy  Lahoma Rocker 09/17/2013, 12:43 PM

## 2013-09-17 NOTE — Progress Notes (Signed)
Inpatient Diabetes Program Recommendations  AACE/ADA: New Consensus Statement on Inpatient Glycemic Control (2013)  Target Ranges:  Prepandial:   less than 140 mg/dL      Peak postprandial:   less than 180 mg/dL (1-2 hours)      Critically ill patients:  140 - 180 mg/dL  Results for ROSSER, COLLINGTON (MRN [id number]) as of 09/17/2013 13:13  Ref. Range 09/16/2013 11:34 09/16/2013 16:43 09/16/2013 21:24 09/17/2013 07:35 09/17/2013 11:20  Glucose-Capillary Latest Range: 70-99 mg/dL 243 (H) 212 (H) 260 (H) 184 (H) 294 (H)   Inpatient Diabetes Program Recommendations Insulin - Meal Coverage: consider adding Novolog meal coverage 4 units TID with meals per Glycemic Control order set (not to be given if eats <50% of meal) Thank you  Raoul Pitch BSN, RN,CDE Inpatient Diabetes Coordinator [phone] (team pager)

## 2013-09-17 NOTE — Progress Notes (Signed)
Speech Language Pathology Daily Session Note  Patient Details  Name: John Carter MRN: [id number] Date of Birth: [birth year]  Today's Date: 09/17/2013 Time: 0800-0830 Time Calculation (min): 30 min  Short Term Goals: Week 1: SLP Short Term Goal 1 (Week 1): Pt will recall swallowing compensatory strategies with supervision question cues.  SLP Short Term Goal 2 (Week 1): Pt will consume current diet with minimal overt s/s of aspiration with supervision verbal cues for utilization of swallowing compensatory strategies.   Skilled Therapeutic Interventions: Skilled treatment focused on dysphagia goals. SLP facilitated session by providing supervision verbal cues for utilization of swallowing compensatory strategies with breakfast meal of Dys. 3 textures with thin liquids. Pt without overt s/s of aspiration throughout the meal. Pt's wife also provided education in regards to swallowing compensatory strategies to increase safety with feeding.  Pt's wife verbalized and demonstrated understanding throughout the meal. Continue with current plan of care.    FIM:  Comprehension Comprehension Mode: Auditory Comprehension: 5-Understands basic 90% of the time/requires cueing < 10% of the time Expression Expression Mode: Verbal Expression: 5-Expresses basic 90% of the time/requires cueing < 10% of the time. Social Interaction Social Interaction: 6-Interacts appropriately with others with medication or extra time (anti-anxiety, antidepressant). Problem Solving Problem Solving: 4-Solves basic 75 - 89% of the time/requires cueing 10 - 24% of the time Memory Memory: 5-Recognizes or recalls 90% of the time/requires cueing < 10% of the time FIM - Eating Eating Activity: 1: Helper feeds patient  Pain No/Denies Pain  Therapy/Group: Individual Therapy  Tranice Laduke 09/17/2013, 3:43 PM

## 2013-09-17 NOTE — Progress Notes (Signed)
[facility] Individual Statement of Services  Patient Name:  John Carter  Date:  09/17/2013  Welcome to the [facility].  Our goal is to provide you with an individualized program based on your diagnosis and situation, designed to meet your specific needs.  With this comprehensive rehabilitation program, you will be expected to participate in at least 3 hours of rehabilitation therapies Monday-Friday, with modified therapy programming on the weekends.  Your rehabilitation program will include the following services:  Physical Therapy (PT), Occupational Therapy (OT), Speech Therapy (ST), 24 hour per day rehabilitation nursing, Therapeutic Recreaction (TR), Neuropsychology, Case Management (Social Worker), Rehabilitation Medicine, Nutrition Services and Pharmacy Services  Weekly team conferences will be held on Tuesdays to discuss your progress.  Your Social Worker will talk with you frequently to get your input and to update you on team discussions.  Team conferences with you and your family in attendance may also be held.  Expected length of stay: 3-4 weeks  Overall anticipated outcome: maximum assistance   Depending on your progress and recovery, your program may change. Your Social Worker will coordinate services and will keep you informed of any changes. Your Social Worker's name and contact numbers are listed  below.  The following services may also be recommended but are not provided by the [facility]:    [place] will be made to provide these services after discharge if needed.  Arrangements include referral to agencies that provide these services.  Your insurance has been verified to be:  Medicare and [place] Your primary doctor is:  Dr. Maxwell Caul  Pertinent information will be shared with your doctor and your  insurance company.  Social Worker:  [place], [place] or ([phone]   Information discussed with and copy given to patient by: Lennart Pall, 09/17/2013, 6:19 PM

## 2013-09-17 NOTE — Progress Notes (Signed)
Social Work  Social Work Assessment and Plan  Patient Details  Name: John Carter MRN: [id number] Date of Birth: March 15, 1930  Today's Date: 09/16/2013  Problem List:  Patient Active Problem List   Diagnosis Date Noted  . C3 spinal cord injury 09/11/2013  . Closed fracture nasal bone 09/05/2013  . Spondylosis, cervical, with myelopathy 09/05/2013  . Central cord syndrome 08/30/2013  . Thrombocytopenia 08/30/2013  . Anemia 08/30/2013  . Fall 08/30/2013  . Knee pain, acute 08/27/2013  . Other pancytopenia 04/16/2013  . Unspecified deficiency anemia 08/06/2011  . Postoperative anemia due to acute blood loss 07/01/2011  . Hypertension   . Hyperlipidemia   . Coronary artery disease   . Myocardial infarction   . Sleep apnea   . TIA (transient ischemic attack)   . Arthritis   . Chronic back pain   . GERD (gastroesophageal reflux disease)   . Hiatal hernia   . Gastric ulcer   . Diverticulosis   . Hemorrhoids   . History of colonic polyps   . Urinary frequency   . Nocturia   . Leg cramps   . Cataracts, bilateral   . Multiple myeloma 06/18/2009   Past Medical History:  Past Medical History  Diagnosis Date  . Angina   . Multiple myeloma 06/2009    was on chemo  . Melanoma   . Hypertension     amlodipine and metoprolol  . Hyperlipidemia     takes Zocor daily  . Coronary artery disease     2 stents placed in 2001  . Myocardial infarction 2001  . Sleep apnea     sleep study done 6yrs ago and pt does use a CPAP;setting of 10  . TIA (transient ischemic attack) 2007  . Arthritis     left knee  . Chronic back pain     compression fracture,degenerative disc disease  . Bruises easily     pt is on Plavix and Aspirin  . GERD (gastroesophageal reflux disease)     takes Omeprazole daily  . Hiatal hernia   . Gastric ulcer   . Diverticulosis   . Hemorrhoids   . History of colonic polyps   . Urinary frequency   . Nocturia   . Leg cramps   . Cataracts, bilateral    . Hiatal hernia   . Other pancytopenia 04/16/2013  . Knee pain, acute 08/27/2013  . Stroke     TIA  . Central cord syndrome   . Diabetes mellitus without complication     2/24 pt denies dm blood sugar up from meds   Past Surgical History:  Past Surgical History  Procedure Laterality Date  . Back surgery  80's  . Tonsillectomy      as a  child  . Colonoscopy    . Appendectomy      as a child  . Knee surgery  1997    left knee arthroscopy  . Knee arthroscopy  1999    right  . Coronary angioplasty  09/04/1999  . Total knee arthroplasty  06/28/2011    Procedure: TOTAL KNEE ARTHROPLASTY;  Surgeon: Nilda Simmer, MD;  Location: [facility] OR;  Service: Orthopedics;  Laterality: Left;  TOTAL KNEE ARTHROPLASTY LEFT SIDE  . Posterior cervical fusion/foraminotomy N/A 09/11/2013    Procedure: CERVICAL THREE TO CERVICAL FOUR POSTERIOR CERVICAL FUSION/FORAMINOTOMY/DECOMPRESSION  LEVEL 1;  Surgeon: Maeola Harman, MD;  Location: [facility];  Service: Neurosurgery;  Laterality: N/A;  C34 posterior fusion with arthrodesis and instrumentation  Social History:  reports that he has never smoked. He has never used smokeless tobacco. He reports that he does not drink alcohol or use illicit drugs.  Family / Support Systems Marital Status: Married How Long?: 30 yrs Patient Roles: Spouse;Other (Comment) (no children) Spouse/Significant Other: wife, Perri Lamagna @ [phone] Children: None Other Supports: pt has a brother and some nephews who are supportive but all living out of town - does not expect them to provide more than occasional assistance Anticipated Caregiver: plans to d/c to SNF after rehab stay Ability/Limitations of Caregiver: wife 7 years old, unable to give beyond supervsiion level care Caregiver Availability: Other (Comment) Family Dynamics: pt describes wife as very supportive and willing to assist as able.    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  Does admit he is "depressed" about his situation, however, plans to "fight it" and feels he is motivated for CIR.  Denies any clinical s/s of depression or anxiety - will monitor.  I do feel it is appropriate for neuropsychology to consult for coping.  OF note, he also reports having LOC with his fall. Recent Psychosocial Issues: None Pyschiatric History: None Substance Abuse History: NOne  Patient / Family Perceptions, Expectations & Goals Pt/Family understanding of illness & functional limitations: Pt and wife with general understanding of the injuries pt suffered in his fall and the symptoms he now has due to central cord injury.   Premorbid pt/family roles/activities: Pt was completely independent PTA.  Still driving.  He managed most of the outside upkeep of home as well as financial matters.  Does note that "for years" he and his wife had caregiver roles for their aging parents and his sister. Anticipated changes in roles/activities/participation: Wife now needs to take responsibility for financial matters and all of the home upkeep.  Caregiver role dependent on whether pt will d/c home or SNF Pt/family expectations/goals: Pt reports that he remains hopeful but realistic that he will have lifelong impairments.  Hopeful he could possibly d/c home.  Community Resources [employer] Scripts:  None Premorbid Home Care/DME Agencies: None Transportation available at discharge: yes Resource referrals recommended: Neuropsychology  Discharge Planning Living Arrangements: Spouse/significant other Support Systems: Spouse/significant other;Other relatives Type of Residence: Private residence Insurance Resources: Education officer, museum (specify) Web designer) Financial Resources: Social Security Living Expenses: Own Money Management: Patient Does the patient have any problems obtaining your medications?: No Home Management: wife primarily Patient/Family Preliminary Plans: Most likely plan is for pt to d/c to SNF, however, he still holds some hope that his recovery will allow him to reach a functional level that he could go home. Barriers to Discharge: Family Support (wife can only provide supervision) Social Work Anticipated Follow Up Needs: HH/OP;SNF Expected length of stay: ELOS 22 to 28 days  Clinical Impression Very unfortunate, elderly man here after a fall and now with SCI/ Central Cord Injury.  Wife supportive, however, cannot provide any assistance beyond supervision. Plan will likely be for pt to d/c to SNF but pt remains hopeful overall.  Will follow  for support and d/c planning.  Amon Costilla 09/16/2013, 6:17 PM

## 2013-09-17 NOTE — Progress Notes (Signed)
Occupational Therapy Session Note  Patient Details  Name: John Carter MRN: [id number] Date of Birth: 03/09/1930  Today's Date: 09/17/2013 Time: 0930-1005 Time Calculation (min): 35 min  Short Term Goals: Week 1:  OT Short Term Goal 1 (Week 1): Patient will complete slide board transfer bed <> w/c with max assist X 1. OT Short Term Goal 2 (Week 1): Patient will demo ability to maintain static sitting balance in prep for assisted upper body dressing with mod assist. OT Short Term Goal 3 (Week 1): Patient will demo ability to actuate call lilght consistently using appropriate extremity independently. OT Short Term Goal 4 (Week 1): Patient will demo ability to actively move UE as gross assist during ADL.  Skilled Therapeutic Interventions/Progress Updates: ADL-retraining with focus on maintaining static sitting balance, anterior/posterior trunk weight shifting while seated in tilt-in-space w/c, NMR of bil UE to provide gross assist for dressing, and weight-bearing through bil LE to facilitate lower body dressing.   Patient received in w/c, wearing hospital gown with spouse present to observe dressing session.   OT completed PROM and AAROM of R-UE with evidence of faciltated triceps contraction noted during AAROM with R-UE supported to eliminate effect of gravity on limb.   Patient able to perform partial anterior/posterior lean (pt = 10%), sustaining static balance X 10 seconds while assisted with upper body dressing.   From sitting, patient completed total assist sit<>stand (pt = 0%) with therapist , while spouse assisted with pulling up his pants.    Patient returned to w/c (total assist stand > sit), and verbalized awareness of change in sitting posture sufficient to request assist with repositioning, as needed, to achieve balance sitting posture, tilted approx 20-30 deg in w/c.        Therapy Documentation Precautions:  Precautions Precautions: Fall;Cervical Precaution Comments: Central  cord presentation - with sitting pt has some spasms causing quick forward lean so be cautious during sitting balance activities.  Required Braces or Orthoses: Cervical Brace Cervical Brace: Hard collar;At all times Restrictions Weight Bearing Restrictions: No  General: General Amount of Missed OT Time (min): 10 Minutes  Vital Signs: Therapy Vitals Pulse Rate: 75 Resp: 18 BP: 135/52 mmHg Patient Position, if appropriate: Sitting Oxygen Therapy SpO2: 100 % O2 Device: None (Room air)  Pain: Pain Assessment Pain Assessment: 0-10 Pain Score: 3  Pain Type: Acute pain Pain Location: Shoulder Pain Orientation: Right Pain Descriptors / Indicators: Aching Pain Frequency: Occasional Pain Onset: Progressive Pain Intervention(s): Medication (See eMAR)  ADL: ADL ADL Comments: see FIM  See FIM for current functional status  Therapy/Group: Individual Therapy  Roxsana Riding 09/17/2013, 11:32 AM

## 2013-09-17 NOTE — Progress Notes (Signed)
Subjective/Complaints: Hungry. Denies pain. Still very weak. Sat up for a few hours yesterday.  Review of Systems - Negative except weakness in all 4 limbs  Objective: Vital Signs: Blood pressure 143/66, pulse 67, temperature 98.4 F (36.9 C), temperature source Oral, resp. rate 19, height $RemoveBe'5\' 8"'[id number]$  (1.727 m), weight 86.4 kg (190 lb 7.6 oz), SpO2 95.00%. No results found. Results for orders placed during the hospital encounter of 09/13/13 (from the past 72 hour(s))  GLUCOSE, CAPILLARY     Status: Abnormal   Collection Time    09/14/13 11:07 AM      Result Value Ref Range   Glucose-Capillary 258 (*) 70 - 99 mg/dL   Comment 1 Notify RN    GLUCOSE, CAPILLARY     Status: Abnormal   Collection Time    09/14/13  4:10 PM      Result Value Ref Range   Glucose-Capillary 224 (*) 70 - 99 mg/dL   Comment 1 Notify RN    GLUCOSE, CAPILLARY     Status: Abnormal   Collection Time    09/14/13  8:57 PM      Result Value Ref Range   Glucose-Capillary 222 (*) 70 - 99 mg/dL  BASIC METABOLIC PANEL     Status: Abnormal   Collection Time    09/15/13  5:31 AM      Result Value Ref Range   Sodium 131 (*) 137 - 147 mEq/L   Potassium 5.2  3.7 - 5.3 mEq/L   Comment: DELTA CHECK NOTED   Chloride 97  96 - 112 mEq/L   CO2 23  19 - 32 mEq/L   Glucose, Bld 221 (*) 70 - 99 mg/dL   BUN 40 (*) 6 - 23 mg/dL   Creatinine, Ser 0.92  0.50 - 1.35 mg/dL   Calcium 7.7 (*) 8.4 - 10.5 mg/dL   GFR calc non Af Amer 77 (*) >90 mL/min   GFR calc Af Amer 89 (*) >90 mL/min   Comment: (NOTE)     The eGFR has been calculated using the CKD EPI equation.     This calculation has not been validated in all clinical situations.     eGFR's persistently <90 mL/min signify possible Chronic Kidney     Disease.  GLUCOSE, CAPILLARY     Status: Abnormal   Collection Time    09/15/13  7:37 AM      Result Value Ref Range   Glucose-Capillary 197 (*) 70 - 99 mg/dL   Comment 1 Notify RN    GLUCOSE, CAPILLARY     Status: Abnormal    Collection Time    09/15/13 11:22 AM      Result Value Ref Range   Glucose-Capillary 210 (*) 70 - 99 mg/dL   Comment 1 Notify RN    GLUCOSE, CAPILLARY     Status: Abnormal   Collection Time    09/15/13  5:00 PM      Result Value Ref Range   Glucose-Capillary 189 (*) 70 - 99 mg/dL   Comment 1 Notify RN    GLUCOSE, CAPILLARY     Status: Abnormal   Collection Time    09/15/13  9:20 PM      Result Value Ref Range   Glucose-Capillary 274 (*) 70 - 99 mg/dL   Comment 1 Notify RN    GLUCOSE, CAPILLARY     Status: Abnormal   Collection Time    09/16/13  7:39 AM      Result Value Ref Range  Glucose-Capillary 202 (*) 70 - 99 mg/dL   Comment 1 Notify RN    GLUCOSE, CAPILLARY     Status: Abnormal   Collection Time    09/16/13 11:34 AM      Result Value Ref Range   Glucose-Capillary 243 (*) 70 - 99 mg/dL   Comment 1 Notify RN    GLUCOSE, CAPILLARY     Status: Abnormal   Collection Time    09/16/13  4:43 PM      Result Value Ref Range   Glucose-Capillary 212 (*) 70 - 99 mg/dL   Comment 1 Notify RN    GLUCOSE, CAPILLARY     Status: Abnormal   Collection Time    09/16/13  9:24 PM      Result Value Ref Range   Glucose-Capillary 260 (*) 70 - 99 mg/dL   Comment 1 Notify RN    GLUCOSE, CAPILLARY     Status: Abnormal   Collection Time    09/17/13  7:35 AM      Result Value Ref Range   Glucose-Capillary 184 (*) 70 - 99 mg/dL   Comment 1 Notify RN        Constitutional: He is oriented to person, place, and time.  HENT: tongue midline, Multiple bruises to the face and forehead  Eyes: EOM are normal.  Neck:  Cervical collar in place  Cardiovascular: Normal rate and regular rhythm.  Respiratory: Effort normal and breath sounds normal. No respiratory distress.  GI: Soft. Bowel sounds are normal. He exhibits no distension.  Neurological: He is alert and oriented to person, place, and time.  Skin: bruising over face Left scalp staples intact motor strength is 0/5 in the right  deltoid, bicep, tricep, grip  Motor strength is trace in the finger flexors on the left otherwise 0 in the deltoid bicep triceps  Right lower extremity 1+  minus hip knee extensor synergy 0/5 at the ankle dorsiflexor plantar flexors  Left lower extremity 2 minus hip flexor 3 minus knee extensor 1+ to 2 minus plantar flexor  Sensory intact to light touch in the right upper and left upper extremity (1/2), sensation slightly better in the LE's but inconsistent  No resting tone.   Absent to light touch right L5 and right S1 dermatomes  Intact to light touch left lower extremity    Assessment/Plan: 1. Functional deficits secondary to spastic tetraplegia central cord which require 3+ hours per day of interdisciplinary therapy in a comprehensive inpatient rehab setting. Physiatrist is providing close team supervision and 24 hour management of active medical problems listed below. Physiatrist and rehab team continue to assess barriers to discharge/monitor patient progress toward functional and medical goals. FIM: FIM - Bathing Bathing Steps Patient Completed:  (receives night baths with nursing) Bathing: 0: Activity did not occur  FIM - Upper Body Dressing/Undressing Upper body dressing/undressing steps patient completed:  (patient elected not to change into street clothing today) Upper body dressing/undressing: 0: Activity did not occur FIM - Lower Body Dressing/Undressing Lower body dressing/undressing: 0: Activity did not occur  FIM - Toileting Toileting: 0: Activity did not occur  FIM - Air cabin crew Transfers: 0-Activity did not occur  FIM - Control and instrumentation engineer Devices: HOB elevated;Orthosis (hard collar) Bed/Chair Transfer: 1: Two helpers  FIM - Locomotion: Wheelchair Locomotion: Wheelchair: 1: Total Assistance/staff pushes wheelchair (Pt<25%) FIM - Locomotion: Ambulation Locomotion: Ambulation:  (unsafe to  attempt)  Comprehension Comprehension Mode: Auditory Comprehension: 5-Understands basic 90% of the time/requires cueing <  10% of the time  Expression Expression Mode: Verbal Expression: 5-Expresses basic 90% of the time/requires cueing < 10% of the time.  Social Interaction Social Interaction: 6-Interacts appropriately with others with medication or extra time (anti-anxiety, antidepressant).  Problem Solving Problem Solving: 3-Solves basic 50 - 74% of the time/requires cueing 25 - 49% of the time  Memory Memory: 5-Recognizes or recalls 90% of the time/requires cueing < 10% of the time  Medical Problem List and Plan:  1. 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 Status post C2-C4 fusion  2. DVT Prophylaxis/Anticoagulation: SCDs.  Dopplers negative 3. Pain Management/chronic back pain t: 300 mg at bedtime, Hydrocodone and Robaxin as needed. Monitor with increased mobility  4. Neuropsych: This patient is capable of making decisions on his own behalf.  5. History of CAD with angioplasty. No chest pain or shortness of breath  6. History of CVA. Plavix currently on hold discuss resuming neurosurgery  7. Diabetes mellitus with peripheral neuropathy. Monitor blood sugars closely while on Decadron taper. Lantus insulin 23 units twice a day.  Continue to monitor  8. Hypertension. Lasix 20 mg daily, Avapro 75 mg daily, Toprol 50 mg daily, Norvasc 5 mg daily. Monitor with increased mobility  9. Hyperlipidemia. Zocor  10. History of multiple myeloma. Followup hematology oncology Dr. Alvy Bimler as needed,--Restart revlimid? 11. Renal: follow up BUN today---encourage fluids  LOS (Days) 4 A FACE TO FACE EVALUATION WAS PERFORMED  SWARTZ,ZACHARY T 09/17/2013, 8:47 AM

## 2013-09-18 ENCOUNTER — Inpatient Hospital Stay (HOSPITAL_COMMUNITY): Payer: Medicare Other | Admitting: Physical Therapy

## 2013-09-18 ENCOUNTER — Inpatient Hospital Stay (HOSPITAL_COMMUNITY): Payer: Medicare Other | Admitting: Speech Pathology

## 2013-09-18 ENCOUNTER — Inpatient Hospital Stay (HOSPITAL_COMMUNITY): Payer: Medicare Other

## 2013-09-18 LAB — BASIC METABOLIC PANEL
BUN: 45 mg/dL — ABNORMAL HIGH (ref 6–23)
CALCIUM: 8.1 mg/dL — AB (ref 8.4–10.5)
CO2: 22 mEq/L (ref 19–32)
Chloride: 100 mEq/L (ref 96–112)
Creatinine, Ser: 0.86 mg/dL (ref 0.50–1.35)
GFR, EST NON AFRICAN AMERICAN: 79 mL/min — AB (ref >90)
Glucose, Bld: 177 mg/dL — ABNORMAL HIGH (ref 70–99)
Potassium: 5.4 mEq/L — ABNORMAL HIGH (ref 3.7–5.3)
SODIUM: 135 meq/L — AB (ref 137–147)

## 2013-09-18 LAB — GLUCOSE, CAPILLARY
GLUCOSE-CAPILLARY: 181 mg/dL — AB (ref 70–99)
GLUCOSE-CAPILLARY: 211 mg/dL — AB (ref 70–99)
Glucose-Capillary: 287 mg/dL — ABNORMAL HIGH (ref 70–99)
Glucose-Capillary: 201 mg/dL — ABNORMAL HIGH (ref 70–99)

## 2013-09-18 MED ORDER — BISACODYL 10 MG RE SUPP
10.0000 mg | Freq: Every day | RECTAL | Status: DC
  Administered 2013-09-20: 10 mg via RECTAL
  Filled 2013-09-18: qty 1

## 2013-09-18 MED ORDER — SACCHAROMYCES BOULARDII 250 MG PO CAPS
250.0000 mg | ORAL_CAPSULE | Freq: Two times a day (BID) | ORAL | Status: DC
  Administered 2013-09-18 – 2013-10-02 (×28): 250 mg via ORAL
  Filled 2013-09-18 (×32): qty 1

## 2013-09-18 NOTE — Progress Notes (Signed)
Subjective/Complaints: No new issues. Slept well. Eating well  Review of Systems - Negative except weakness in all 4 limbs  Objective: Vital Signs: Blood pressure 125/63, pulse 64, temperature 97.3 F (36.3 C), temperature source Oral, resp. rate 18, height _0  (1.727 m), weight 86.4 kg (190 lb 7.6 oz), SpO2 100.00%. No results found. Results for orders placed during the hospital encounter of 09/13/13 (from the past 72 hour(s))  GLUCOSE, CAPILLARY     Status: Abnormal   Collection Time    09/15/13 11:22 AM      Result Value Ref Range   Glucose-Capillary 210 (*) 70 - 99 mg/dL   Comment 1 Notify RN    GLUCOSE, CAPILLARY     Status: Abnormal   Collection Time    09/15/13  5:00 PM      Result Value Ref Range   Glucose-Capillary 189 (*) 70 - 99 mg/dL   Comment 1 Notify RN    GLUCOSE, CAPILLARY     Status: Abnormal   Collection Time    09/15/13  9:20 PM      Result Value Ref Range   Glucose-Capillary 274 (*) 70 - 99 mg/dL   Comment 1 Notify RN    GLUCOSE, CAPILLARY     Status: Abnormal   Collection Time    09/16/13  7:39 AM      Result Value Ref Range   Glucose-Capillary 202 (*) 70 - 99 mg/dL   Comment 1 Notify RN    GLUCOSE, CAPILLARY     Status: Abnormal   Collection Time    09/16/13 11:34 AM      Result Value Ref Range   Glucose-Capillary 243 (*) 70 - 99 mg/dL   Comment 1 Notify RN    GLUCOSE, CAPILLARY     Status: Abnormal   Collection Time    09/16/13  4:43 PM      Result Value Ref Range   Glucose-Capillary 212 (*) 70 - 99 mg/dL   Comment 1 Notify RN    GLUCOSE, CAPILLARY     Status: Abnormal   Collection Time    09/16/13  9:24 PM      Result Value Ref Range   Glucose-Capillary 260 (*) 70 - 99 mg/dL   Comment 1 Notify RN    GLUCOSE, CAPILLARY     Status: Abnormal   Collection Time    09/17/13  7:35 AM      Result Value Ref Range   Glucose-Capillary 184 (*) 70 - 99 mg/dL   Comment 1 Notify RN    BASIC METABOLIC PANEL     Status: Abnormal   Collection Time     09/17/13  9:20 AM      Result Value Ref Range   Sodium 130 (*) 137 - 147 mEq/L   Potassium 5.3  3.7 - 5.3 mEq/L   Chloride 97  96 - 112 mEq/L   CO2 20  19 - 32 mEq/L   Glucose, Bld 299 (*) 70 - 99 mg/dL   BUN 43 (*) 6 - 23 mg/dL   Creatinine, Ser 0.85  0.50 - 1.35 mg/dL   Calcium 7.6 (*) 8.4 - 10.5 mg/dL   GFR calc non Af Amer 80 (*) >90 mL/min   GFR calc Af Amer >90  >90 mL/min   Comment: (NOTE)     The eGFR has been calculated using the CKD EPI equation.     This calculation has not been validated in all clinical situations.  eGFR's persistently <90 mL/min signify possible Chronic Kidney     Disease.  CBC     Status: Abnormal   Collection Time    09/17/13  9:20 AM      Result Value Ref Range   WBC 17.3 (*) 4.0 - 10.5 K/uL   RBC 3.37 (*) 4.22 - 5.81 MIL/uL   Hemoglobin 11.1 (*) 13.0 - 17.0 g/dL   HCT 31.7 (*) 39.0 - 52.0 %   MCV 94.1  78.0 - 100.0 fL   MCH 32.9  26.0 - 34.0 pg   MCHC 35.0  30.0 - 36.0 g/dL   RDW 13.8  11.5 - 15.5 %   Platelets 49 (*) 150 - 400 K/uL   Comment: SPECIMEN CHECKED FOR CLOTS     REPEATED TO VERIFY     CONSISTENT WITH PREVIOUS RESULT  GLUCOSE, CAPILLARY     Status: Abnormal   Collection Time    09/17/13 11:20 AM      Result Value Ref Range   Glucose-Capillary 294 (*) 70 - 99 mg/dL   Comment 1 Notify RN    GLUCOSE, CAPILLARY     Status: Abnormal   Collection Time    09/17/13  4:25 PM      Result Value Ref Range   Glucose-Capillary 154 (*) 70 - 99 mg/dL   Comment 1 Notify RN    GLUCOSE, CAPILLARY     Status: Abnormal   Collection Time    09/17/13  8:44 PM      Result Value Ref Range   Glucose-Capillary 220 (*) 70 - 99 mg/dL  BASIC METABOLIC PANEL     Status: Abnormal   Collection Time    09/18/13  5:02 AM      Result Value Ref Range   Sodium 135 (*) 137 - 147 mEq/L   Potassium 5.4 (*) 3.7 - 5.3 mEq/L   Chloride 100  96 - 112 mEq/L   CO2 22  19 - 32 mEq/L   Glucose, Bld 177 (*) 70 - 99 mg/dL   BUN 45 (*) 6 - 23 mg/dL    Creatinine, Ser 0.86  0.50 - 1.35 mg/dL   Calcium 8.1 (*) 8.4 - 10.5 mg/dL   GFR calc non Af Amer 79 (*) >90 mL/min   GFR calc Af Amer >90  >90 mL/min   Comment: (NOTE)     The eGFR has been calculated using the CKD EPI equation.     This calculation has not been validated in all clinical situations.     eGFR's persistently <90 mL/min signify possible Chronic Kidney     Disease.  GLUCOSE, CAPILLARY     Status: Abnormal   Collection Time    09/18/13  7:28 AM      Result Value Ref Range   Glucose-Capillary 181 (*) 70 - 99 mg/dL      Constitutional: He is oriented to person, place, and time.  HENT: tongue midline, Multiple bruises to the face and forehead  Eyes: EOM are normal.  Neck:  Cervical collar in place  Cardiovascular: Normal rate and regular rhythm.  Respiratory: Effort normal and breath sounds normal. No respiratory distress.  GI: Soft. Bowel sounds are normal. He exhibits no distension.  Neurological: He is alert and oriented to person, place, and time.  Skin: bruising over face Left scalp staples intact motor strength is 0/5 in the right deltoid, bicep, tricep, grip  Motor strength is trace in the finger flexors on the left otherwise 0 in the  deltoid bicep triceps  Right lower extremity 1+  minus hip knee extensor synergy 0/5 at the ankle dorsiflexor plantar flexors  Left lower extremity 2 minus hip flexor 3 minus knee extensor 1+ to 2 minus plantar flexor  Sensory intact to light touch in the right upper and left upper extremity (1/2), sensation slightly better in the LE's but inconsistent  No resting tone.   Absent to light touch right L5 and right S1 dermatomes  Intact to light touch left lower extremity    Assessment/Plan: 1. Functional deficits secondary to spastic tetraplegia central cord which require 3+ hours per day of interdisciplinary therapy in a comprehensive inpatient rehab setting. Physiatrist is providing close team supervision and 24 hour management  of active medical problems listed below. Physiatrist and rehab team continue to assess barriers to discharge/monitor patient progress toward functional and medical goals. FIM: FIM - Bathing Bathing Steps Patient Completed:  (receives night baths with nursing) Bathing: 0: Activity did not occur  FIM - Upper Body Dressing/Undressing Upper body dressing/undressing steps patient completed:  (patient elected not to change into street clothing today) Upper body dressing/undressing: 0: Activity did not occur FIM - Lower Body Dressing/Undressing Lower body dressing/undressing: 0: Activity did not occur  FIM - Toileting Toileting: 0: Activity did not occur  FIM - Air cabin crew Transfers: 0-Activity did not occur  FIM - Control and instrumentation engineer Devices: HOB elevated;Orthosis Bed/Chair Transfer: 1: Two helpers  FIM - Locomotion: Wheelchair Locomotion: Wheelchair: 1: Total Assistance/staff pushes wheelchair (Pt<25%) FIM - Locomotion: Ambulation Locomotion: Ambulation:  (unsafe to attempt)  Comprehension Comprehension Mode: Auditory Comprehension: 5-Understands complex 90% of the time/Cues < 10% of the time  Expression Expression Mode: Verbal Expression: 5-Expresses complex 90% of the time/cues < 10% of the time  Social Interaction Social Interaction: 6-Interacts appropriately with others with medication or extra time (anti-anxiety, antidepressant).  Problem Solving Problem Solving: 4-Solves basic 75 - 89% of the time/requires cueing 10 - 24% of the time  Memory Memory: 5-Recognizes or recalls 90% of the time/requires cueing < 10% of the time  Medical Problem List and Plan:  1. 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 Status post C2-C4 fusion  2. DVT Prophylaxis/Anticoagulation: SCDs.  Dopplers negative 3. Pain Management/chronic back pain t: 300 mg at bedtime, Hydrocodone and Robaxin as needed. Monitor with increased mobility  4.  Neuropsych: This patient is capable of making decisions on his own behalf.  5. History of CAD with angioplasty. No chest pain or shortness of breath  6. History of CVA. Plavix currently on hold discuss resuming neurosurgery soon 7. Diabetes mellitus with peripheral neuropathy. Monitor blood sugars closely while on Decadron taper. Lantus insulin 23 units twice a day---titrate to 25 8. Hypertension. Lasix 20 mg daily (held), Avapro 75 mg daily, Toprol 50 mg daily, Norvasc 5 mg daily. Monitor with increased mobility  9. Hyperlipidemia. Zocor  10. History of multiple myeloma. Followup hematology oncology Dr. Alvy Bimler as needed,--Restart revlimid? 11. Renal: continue IVF. Lasix held  -recheck labs in the am  -encourage pO  LOS (Days) 5 A FACE TO FACE EVALUATION WAS PERFORMED  Saniyah Mondesir T 09/18/2013, 8:38 AM

## 2013-09-18 NOTE — Progress Notes (Signed)
Social Work Patient ID: John Carter, male   DOB: 1929-10-13, 80 y.o.   MRN: [id number]  Met with pt and wife following team conference to review goals and discuss d/c plans.  Both pleased with small gains made so far.  Pt stating his plan to keep "plowing ahead and get whatever I can."  Explained that the goals are set at reaching a one caregiver level and to have a planned B/B program in place prior to SNF.  Pt and wife agreed that SNF is best option at this point to maximize his therapy time after CIR.  Continue to follow for support and d/c planning.  Jancy Sprankle, LCSW

## 2013-09-18 NOTE — Patient Care Conference (Addendum)
Inpatient RehabilitationTeam Conference and Plan of Care Update Date: 09/19/2013   Time: 2:10 PM    Patient Name: John Carter      Medical Record Number: [id number]  Date of Birth: [birth year] Sex: Male         Room/Bed: 4W18C/4W18C-01 Payor Info: Payor: MEDICARE / Plan: MEDICARE PART A AND B / Product Type: *No Product type* /    Admitting Diagnosis: CENTRAL CORD   Admit Date/Time:  09/13/2013  4:33 PM Admission Comments: No comment available   Primary Diagnosis:  <principal problem not specified> Principal Problem: <principal problem not specified>  Patient Active Problem List   Diagnosis Date Noted  . C3 spinal cord injury 09/11/2013  . Closed fracture nasal bone 09/05/2013  . Spondylosis, cervical, with myelopathy 09/05/2013  . Central cord syndrome 08/30/2013  . Thrombocytopenia 08/30/2013  . Anemia 08/30/2013  . Fall 08/30/2013  . Knee pain, acute 08/27/2013  . Other pancytopenia 04/16/2013  . Unspecified deficiency anemia 08/06/2011  . Postoperative anemia due to acute blood loss 07/01/2011  . Hypertension   . Hyperlipidemia   . Coronary artery disease   . Myocardial infarction   . Sleep apnea   . TIA (transient ischemic attack)   . Arthritis   . Chronic back pain   . GERD (gastroesophageal reflux disease)   . Hiatal hernia   . Gastric ulcer   . Diverticulosis   . Hemorrhoids   . History of colonic polyps   . Urinary frequency   . Nocturia   . Leg cramps   . Cataracts, bilateral   . Multiple myeloma 06/18/2009    Expected Discharge Date: Expected Discharge Date: 10/12/13 (to SNF)  Team Members Present: Physician leading conference: Dr. Alger Simons Social Worker Present: Lennart Pall, LCSW Nurse Present: Elliot Cousin, RN PT Present: Ladona Horns, PT OT Present: Chrys Racer, Starling Manns, OT;Frank Powell, OT SLP Present: Weston Anna, SLP PPS Coordinator present : Daiva Nakayama, RN, CRRN;Becky Alwyn Ren, PT     Current Status/Progress  Goal Weekly Team Focus  Medical   severe central cord C4-5. tetraplegia, right more than left. therapy reports some tricep movement today  regulate bowel and bladder, maximize neurological return  education, NMR   Bowel/Bladder   Foley patent;  Establishing bowel program...incontinent.  Effective bowel program with BM Qday; Monitor urine, no s/s infection. Trial voiding at later date.  Continue routine bowel program; monitor urine, foley care.   Swallow/Nutrition/ Hydration   Dys. 3 textures with thin liquids, Mod I for directing self-feeding  Mod I with least restrictive diet  Trials of regular textures    ADL's   Total Assist ADL, +2 transfers   Max A bathing and lower body dressing, Mod A upper body dressing, Min A dynamic sitting balance, Mod A transfers  Reduced burden of care (overall Max A X 1), UE function, transfers, sit <> stand   Mobility   +2 total assist for bed mobility and transfers  Mod assist sliding board transfer, max bed mobility  OOB tolerance, NMR, sitting balance, rolling, transfers   Communication             Safety/Cognition/ Behavioral Observations  No unsafe behaviors, anticipates needs, calls for assist ( Soft call bell at foot)  Remain free from falls, injury this admission  MAxi Lift OOB for transfers; frequent checks   Pain   C/o generalized pain/discomfort; > at right shoulder  managed 2/10  Monitor, offer PRN's   Skin   Buttocks slightly red,  facial abrasions healing; incision line, dressing CDI ; Incision line not completely visible d/t Aspen collar.  Turn and reposition; EPBC to buttocks; frequent checks for incontinence.       Rehab Goals Patient on target to meet rehab goals: Yes *See Care Plan and progress notes for long and short-term goals.  Barriers to Discharge: profound deficits    Possible Resolutions to Barriers:  family ed, secondary level of care.    Discharge Planning/Teaching Needs:  Plan for SNF following CIR unless unexpected  progress made      Team Discussion:  Severe injury.  Very motivated.  Hope to begin trial of reg diet tomorrow.  Need to focus on B/B program and goal of +1 caregiver overall.  Plan for SNF following CIR  Revisions to Treatment Plan:  None   Continued Need for Acute Rehabilitation Level of Care: The patient requires daily medical management by a physician with specialized training in physical medicine and rehabilitation for the following conditions: Daily direction of a multidisciplinary physical rehabilitation program to ensure safe treatment while eliciting the highest outcome that is of practical value to the patient.: Yes Daily medical management of patient stability for increased activity during participation in an intensive rehabilitation regime.: Yes Daily analysis of laboratory values and/or radiology reports with any subsequent need for medication adjustment of medical intervention for : Other;Post surgical problems;Neurological problems  Oneta Sigman 09/19/2013, 12:25 PM

## 2013-09-18 NOTE — Plan of Care (Signed)
Problem: SCI BLADDER ELIMINATION Goal: RH STG MANAGE BLADDER WITH EQUIPMENT WITH ASSISTANCE STG Manage Bladder With Equipment With mod Assistance  Outcome: Not Progressing Indwelling foley

## 2013-09-18 NOTE — Progress Notes (Deleted)
Social Work Patient ID: John Carter, male   DOB: [birth year], 80 y.o.   MRN: [id number]  John Pall, LCSW Social Worker Signed  Patient Care Conference Service date: 09/18/2013 7:21 PM  Inpatient RehabilitationTeam Conference and Plan of Care Update Date: 09/18/2013   Time: 2:10 PM     Patient Name: John Carter       Medical Record Number: [id number]   Date of Birth: 05/11/1930 Sex: Male         Room/Bed: 4W18C/4W18C-01 Payor Info: Payor: MEDICARE / Plan: MEDICARE PART A AND B / Product Type: *No Product type* /   Admitting Diagnosis: CENTRAL CORD   Admit Date/Time:  09/13/2013  4:33 PM Admission Comments: No comment available   Primary Diagnosis:  <principal problem not specified> Principal Problem: <principal problem not specified>    Patient Active Problem List     Diagnosis  Date Noted   .  C3 spinal cord injury  09/11/2013   .  Closed fracture nasal bone  09/05/2013   .  Spondylosis, cervical, with myelopathy  09/05/2013   .  Central cord syndrome  08/30/2013   .  Thrombocytopenia  08/30/2013   .  Anemia  08/30/2013   .  Fall  08/30/2013   .  Knee pain, acute  08/27/2013   .  Other pancytopenia  04/16/2013   .  Unspecified deficiency anemia  08/06/2011   .  Postoperative anemia due to acute blood loss  07/01/2011   .  Hypertension     .  Hyperlipidemia     .  Coronary artery disease     .  Myocardial infarction     .  Sleep apnea     .  TIA (transient ischemic attack)     .  Arthritis     .  Chronic back pain     .  GERD (gastroesophageal reflux disease)     .  Hiatal hernia     .  Gastric ulcer     .  Diverticulosis     .  Hemorrhoids     .  History of colonic polyps     .  Urinary frequency     .  Nocturia     .  Leg cramps     .  Cataracts, bilateral     .  Multiple myeloma  06/18/2009     Expected Discharge Date: Expected Discharge Date: 10/12/13 (to SNF)  Team Members Present: Physician leading conference: Dr. Alger Simons Social Worker  Present: John Pall, LCSW Nurse Present: Elliot Cousin, RN PT Present: Ladona Horns, PT OT Present: Chrys Racer, Starling Manns, OT;Frank [place], OT SLP Present: Weston Anna, SLP PPS Coordinator present : Daiva Nakayama, RN, CRRN;Becky Alwyn Ren, PT        Current Status/Progress  Goal  Weekly Team Focus   Medical     severe central cord C4-5. tetraplegia, right more than left. therapy reports some tricep movement today  regulate bowel and bladder, maximize neurological return  education, NMR   Bowel/Bladder            Swallow/Nutrition/ Hydration     Dys. 3 textures with thin liquids, Mod I for directing self-feeding  Mod I with least restrictive diet  Trials of regular textures    ADL's     Total Assist ADL, +2 transfers   Max A bathing and lower body dressing, Mod A upper body dressing, Min A dynamic sitting balance, Mod A transfers  Reduced  burden of care (overall Max A X 1), UE function, transfers, sit <> stand   Mobility     +2 total assist for bed mobility and transfers  Mod assist sliding board transfer, max bed mobility  OOB tolerance, NMR, sitting balance, rolling, transfers   Communication            Safety/Cognition/ Behavioral Observations           Pain            Skin             Rehab Goals Patient on target to meet rehab goals: Yes *See Care Plan and progress notes for long and short-term goals.    Barriers to Discharge:  profound deficits      Possible Resolutions to Barriers:    family ed, secondary level of care.      Discharge Planning/Teaching Needs:    Plan for SNF following CIR unless unexpected progress made      Team Discussion:    Severe injury.  Very motivated.  Hope to begin trial of reg diet tomorrow.  Need to focus on B/B program and goal of +1 caregiver overall.  Plan for SNF following CIR   Revisions to Treatment Plan:    None    Continued Need for Acute Rehabilitation Level of Care: The patient requires daily medical  management by a physician with specialized training in physical medicine and rehabilitation for the following conditions: Daily direction of a multidisciplinary physical rehabilitation program to ensure safe treatment while eliciting the highest outcome that is of practical value to the patient.: Yes Daily medical management of patient stability for increased activity during participation in an intensive rehabilitation regime.: Yes Daily analysis of laboratory values and/or radiology reports with any subsequent need for medication adjustment of medical intervention for : Other;Post surgical problems;Neurological problems  Sharea Guinther 09/18/2013, 7:21 PM

## 2013-09-18 NOTE — Progress Notes (Signed)
Speech Language Pathology Daily Session Note  Patient Details  Name: John Carter MRN: [id number] Date of Birth: 1929-12-25  Today's Date: 09/18/2013 Time: 5361-4431 Time Calculation (min): 25 min  Short Term Goals: Week 1: SLP Short Term Goal 1 (Week 1): Pt will recall swallowing compensatory strategies with supervision question cues.  SLP Short Term Goal 2 (Week 1): Pt will consume current diet with minimal overt s/s of aspiration with supervision verbal cues for utilization of swallowing compensatory strategies.   Skilled Therapeutic Interventions: Skilled treatment focused on dysphagia goals. Upon arrival, pt sitting up in wheelchair consuming Dys. 3 textures with thin liquids via straw without overt s/s of aspiration. Pt is a dependent feeder and is Mod I for directing feeding in regards to small bites and slow pace with Mod I. Pt's overall vocal quality and intensity was improved today and appeared at baseline. Pt also reported he had noticed a "change" in his swallowing and that "food was going down easier," suspect due to decreased edema. Pt was full from breakfast meal but will attempt trials of regular textures tomorrow for possible upgrade. Both the pt and his wife verbalized understanding and agreement. Continue with current plan of care.    FIM:  Comprehension Comprehension Mode: Auditory Comprehension: 5-Understands complex 90% of the time/Cues < 10% of the time Expression Expression: 5-Expresses complex 90% of the time/cues < 10% of the time Social Interaction Social Interaction: 6-Interacts appropriately with others with medication or extra time (anti-anxiety, antidepressant). Problem Solving Problem Solving: 4-Solves basic 75 - 89% of the time/requires cueing 10 - 24% of the time Memory Memory: 5-Recognizes or recalls 90% of the time/requires cueing < 10% of the time FIM - Eating Eating Activity: 1: Helper feeds patient  Pain Pain Assessment Pain Assessment:  No/denies pain  Therapy/Group: Individual Therapy  John Carter 09/18/2013, 9:03 AM

## 2013-09-18 NOTE — Progress Notes (Addendum)
Physical Therapy Session Note  Patient Details  Name: John Carter MRN: [id number] Date of Birth: 1930-02-07  Today's Date: 09/18/2013 First Session Time: 0900-1000 Time Calculation (min): 60 min  Short Term Goals: Week 1:  PT Short Term Goal 1 (Week 1): Pt will transfer bed<> wheelchair total assist +2 (pt = 15%) PT Short Term Goal 2 (Week 1): Pt will maintain static sitting balance x 1 min with mod assist PT Short Term Goal 3 (Week 1): Pt will direct w/c and sliding board set-up with mod verbal cues.   Skilled Therapeutic Interventions/Progress Updates:    Sliding board transfer w/c to/from level mat +2 total assist with PT facilitation of quads, very minimal activation noted. Static sitting balance EOM (8 sec max hold duration) without back support. Dynamic balance anterior/posterior trunk leans, lateral leans with focus on controlled return to midline. Min to total assist for sitting balance. Increased time for positioning in w/c.   Second Session Time:  1300-1330 Time Calculation (min): 30 min Skilled Therapeutic Interventions/Progress Updates:  Pt requesting to return to bed to rest before next OT session. Sliding board transfer w/c to bed +2 total assist however pt DID have some LE activation during this transfer. Sit > supine +2 total assist. Demonstrated stretching program with PT tech (PROM ankle dorsiflexion/plantarflexion, hip flexion/extension, hip internal/external rotation), she will benefit from return demonstration prior to starting stretching program in mornings.    Therapy Documentation Precautions:  Precautions Precautions: Fall;Cervical Precaution Comments: Central cord presentation - with sitting pt has some spasms causing quick forward lean so be cautious during sitting balance activities.  Required Braces or Orthoses: Cervical Brace Cervical Brace: Hard collar;At all times Restrictions Weight Bearing Restrictions: No Pain: Pain Assessment Pain  Assessment: No/denies pain either session  See FIM for current functional status  Therapy/Group: Individual Therapy both sessions  Lahoma Rocker 09/18/2013, 12:26 PM

## 2013-09-18 NOTE — Progress Notes (Signed)
Occupational Therapy Session Note  Patient Details  Name: John Carter MRN: [id number] Date of Birth: [birth year]  Today's Date: 09/18/2013 Time: 0730-0830 Time Calculation (min): 60 min  Short Term Goals: Week 1:  OT Short Term Goal 1 (Week 1): Patient will complete slide board transfer bed <> w/c with max assist X 1. OT Short Term Goal 2 (Week 1): Patient will demo ability to maintain static sitting balance in prep for assisted upper body dressing with mod assist. OT Short Term Goal 3 (Week 1): Patient will demo ability to actuate call lilght consistently using appropriate extremity independently. OT Short Term Goal 4 (Week 1): Patient will demo ability to actively move UE as gross assist during ADL.  Skilled Therapeutic Interventions/Progress Updates:  ADL-retraining with focus on bed mobility, lower body dressing, static sitting balance, and direction of care (self-feeding).   Patient received supine in bed, receptive for activity.   Just prior to mech lift transfer to w/c, patient voided loose BM and required total assist for hygiene and clothing management.   Patient demo'd active triceps extension at left UE this session, improving his participation in bed mobility (pt = 15%).   Patient was also able to bend his knees as cued to assist with lower body dressing, although requiring total assist to don diaper and pants.   Following mech transfer to w/c, patient directed caregivers (OT and spouse) with self-feeding preferences.   OT re-educated spouse on self-feeding methods as posted over patient's bed to complete feeding task as session concluded.    Therapy Documentation Precautions:  Precautions Precautions: Fall;Cervical Precaution Comments: Central cord presentation - with sitting pt has some spasms causing quick forward lean so be cautious during sitting balance activities.  Required Braces or Orthoses: Cervical Brace Cervical Brace: Hard collar;At all times Restrictions Weight  Bearing Restrictions: No  Pain: Pain Assessment Pain Assessment: No/denies pain  ADL: ADL ADL Comments: see FIM  See FIM for current functional status  Therapy/Group: Individual Therapy  Second session: Time: 5056-9794 Time Calculation (min):  30 min  Pain Assessment: No pain  Skilled Therapeutic Interventions: Therapeutic activity with focus on bil UE NMR.   With UE (left, then right) supported at wrist and elbow to eliminate gravity, patient was able to activate elbow flexion/extension (2-/5), shoulder extension (2/5), and shoulder internal rotation (2-/5).   Patient performed 10 repetitions with each arm, resting 3 times, during session.   Patient demo'd excellent engagement and awareness of intent of session: prepartory for improved self-feeding.   See FIM for current functional status  Therapy/Group: Individual Therapy  Tuan Tippin 09/18/2013, 9:28 AM

## 2013-09-19 ENCOUNTER — Telehealth: Payer: Self-pay | Admitting: *Deleted

## 2013-09-19 ENCOUNTER — Inpatient Hospital Stay (HOSPITAL_COMMUNITY): Payer: Medicare Other | Admitting: *Deleted

## 2013-09-19 ENCOUNTER — Inpatient Hospital Stay (HOSPITAL_COMMUNITY): Payer: Medicare Other | Admitting: Speech Pathology

## 2013-09-19 ENCOUNTER — Inpatient Hospital Stay (HOSPITAL_COMMUNITY): Payer: Medicare Other

## 2013-09-19 LAB — BASIC METABOLIC PANEL
BUN: 38 mg/dL — ABNORMAL HIGH (ref 6–23)
CALCIUM: 8.3 mg/dL — AB (ref 8.4–10.5)
CO2: 21 mEq/L (ref 19–32)
CREATININE: 0.72 mg/dL (ref 0.50–1.35)
Chloride: 99 mEq/L (ref 96–112)
GFR, EST NON AFRICAN AMERICAN: 85 mL/min — AB (ref >90)
Glucose, Bld: 215 mg/dL — ABNORMAL HIGH (ref 70–99)
Potassium: 5.1 mEq/L (ref 3.7–5.3)
Sodium: 133 mEq/L — ABNORMAL LOW (ref 137–147)

## 2013-09-19 LAB — GLUCOSE, CAPILLARY
GLUCOSE-CAPILLARY: 232 mg/dL — AB (ref 70–99)
Glucose-Capillary: 202 mg/dL — ABNORMAL HIGH (ref 70–99)
Glucose-Capillary: 256 mg/dL — ABNORMAL HIGH (ref 70–99)
Glucose-Capillary: 166 mg/dL — ABNORMAL HIGH (ref 70–99)
Glucose-Capillary: 206 mg/dL — ABNORMAL HIGH (ref 70–99)

## 2013-09-19 NOTE — Progress Notes (Signed)
Occupational Therapy Session Note  Patient Details  Name: John Carter MRN: [id number] Date of Birth: 04-29-1930  Today's Date: 09/19/2013 Time: 0730-0830 Time Calculation (min): 60 min  Short Term Goals: Week 1:  OT Short Term Goal 1 (Week 1): Patient will complete slide board transfer bed <> w/c with max assist X 1. OT Short Term Goal 2 (Week 1): Patient will demo ability to maintain static sitting balance in prep for assisted upper body dressing with mod assist. OT Short Term Goal 3 (Week 1): Patient will demo ability to actuate call lilght consistently using appropriate extremity independently. OT Short Term Goal 4 (Week 1): Patient will demo ability to actively move UE as gross assist during ADL.  Skilled Therapeutic Interventions/Progress Updates:  ADL-retraining with focus on bed mobility, lower body dressing, supine>sit at edge of bed, static sitting tolerance, and slide board transfer.   Patient received supine in bed, diaper soiled with moderate BM.   OT completed toilet hygiene and clothing management using facilitated bed mobility. Patient was able to turn his head slightly to look to right and left when cued and flex knees during bed mobility but he was unable to maintain internal rotation or horizontal adduction of shoulder after facilitation of trunk rotation to side-lying.   After total assist with lower body dressing, patient rose from supine to sitting at edge of bed, max assist.  Patient reported symptoms of lightheadedness but recovered with mod assist to maintain static sitting balance X 3 minutes.   Patient completed total assist X1 slide board transfer to w/c in prep for self-feeding.   OT provided setup assist with spouse to continue assisted feeding session.    Therapy Documentation Precautions:  Precautions Precautions: Fall;Cervical Precaution Comments: Central cord presentation - with sitting pt has some spasms causing quick forward lean so be cautious during  sitting balance activities.  Required Braces or Orthoses: Cervical Brace Cervical Brace: Hard collar;At all times Restrictions Weight Bearing Restrictions: No  Vital Signs: Therapy Vitals Temp: 97 F (36.1 C) Temp src: Oral Pulse Rate: 79 Resp: 18 BP: 130/52 mmHg Patient Position, if appropriate: Lying Oxygen Therapy SpO2: 95 % O2 Device: None (Room air) Pain: No pain   ADL: ADL ADL Comments: see FIM  See FIM for current functional status  Therapy/Group: Individual Therapy  Second session: [id number] Time Calculation (min):  23 min  Pain Assessment: 5/10 at right clavicle; with activity, repositioned  Skilled Therapeutic Interventions: Co-treatment session with physical therapist to address improved standing tolerance using standing frame, weight-bearing through bil LE, weight-shifting, slide board transfer.   Patient completed sit<>stand 4 times using standing frame and mechanical lift assist.   Patient reported lightheadedness/nausea after initial stand; bp/hr assessed after return to sitting position, within limits (149/68, 73 bpm).   Unable to record standing bp/hr d/t mechanical failure of dynamap.   Patient recovered from symptoms with 3 min rest break and cool washcloth applied to neck and face.   Patient tolerated 3 additional stands, with maximum tolerance of standing approx 20 seconds.   Patient returned to bed with total assist slide board transfer X 1.  See FIM for current functional status  Therapy/Group: Co-treatment Therapy  Emersen Carroll 09/19/2013, 8:34 AM

## 2013-09-19 NOTE — Progress Notes (Signed)
Physical Therapy Note  Patient Details  Name: TRESEAN MATTIX MRN: [id number] Date of Birth: [birth year] Today's Date: 09/19/2013  8022-3361 (55 minutes) individual Pain: no reported pain Focus of treatment: Therapeutic activities focused on trunk control in sitting Treatment: Pt up in wc upon arrival; wc mobility total assist; transfers total assist +2 using sliding board wc >< mat ; sitting edge of mat with therapist in back/ tech in front; pt arms placed on bedside tables on either side or in front to support UEs; pt unable to attain static sitting without max assist +1; pt able to intermittently initiate minimal weight shift to right or forward; returned to room with wife present.    2244-9753 (22 minutes) individual Pain: no pain reported Focus of treatment: standing tolerance in standing frame Treatment: cotreat with OT; pt able to stand for approximately 2 minutes in standing frame with max assist +2 to maintain trunk extension ; BP in standing 143/69 with no complaint of dizziness; returned to room ; transfer wc to bed total assist +2 with sliding board; sit to supine total assist +2.   Meyer Dockery,JIM 09/19/2013, 9:59 AM

## 2013-09-19 NOTE — Telephone Encounter (Signed)
Notified [employer] of Revlimid on Hold for now due to pt's hospitalization/ rehab.

## 2013-09-19 NOTE — Telephone Encounter (Signed)
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 He is concerned he is not getting treatment for his myeloma while there. She came in to inquire about this. Per Dr. Alvy Bimler :He needs time to heal and the chemo would delay wound healing. He will be OK to wait a couple months without treatment. Call office when she thinks he can travel to office visit. Notified John Carter of this and provided her a copy of last SPEP and Light chains to show levels are not too high at this time. She appreciated the information and will relay this to her husband.

## 2013-09-19 NOTE — Progress Notes (Signed)
Inpatient Diabetes Program Recommendations  AACE/ADA: New Consensus Statement on Inpatient Glycemic Control (2013)  Target Ranges:  Prepandial:   less than 140 mg/dL      Peak postprandial:   less than 180 mg/dL (1-2 hours)      Critically ill patients:  140 - 180 mg/dL     Results for John Carter, John Carter (MRN [id number]) as of 09/19/2013 13:42  Ref. Range 09/18/2013 07:28 09/18/2013 11:38 09/18/2013 16:37 09/18/2013 20:40  Glucose-Capillary Latest Range: 70-99 mg/dL 181 (H) 287 (H) 211 (H) 201 (H)    Results for John Carter, John Carter (MRN [id number]) as of 09/19/2013 13:42  Ref. Range 09/19/2013 07:16 09/19/2013 11:29  Glucose-Capillary Latest Range: 70-99 mg/dL 202 (H) 256 (H)     **MD- Please consider the following in-hospital insulin adjustments:  1. Increase Lantus to 28 units bid 2. Add Novolog meal coverage- Novolog 6 units tid with meals   Will follow. Wyn Quaker RN, MSN, CDE Diabetes Coordinator Inpatient Diabetes Program Team Pager: [phone] (8a-10p)

## 2013-09-19 NOTE — Progress Notes (Signed)
Speech Language Pathology Daily Session Note  Patient Details  Name: John Carter MRN: [id number] Date of Birth: September 24, 1929  Today's Date: 09/19/2013 Time: 7017-7939 Time Calculation (min): 25 min  Short Term Goals: Week 1: SLP Short Term Goal 1 (Week 1): Pt will recall swallowing compensatory strategies with supervision question cues.  SLP Short Term Goal 2 (Week 1): Pt will consume current diet with minimal overt s/s of aspiration with supervision verbal cues for utilization of swallowing compensatory strategies.   Skilled Therapeutic Interventions: Skilled treatment session focused on dysphagia goals. SLP facilitated session by providing trials of regular textures. Pt with intermittent overt s/s of aspiration with throat clearing and required multiple liquid washes to clear suspected pharyngeal residue.  Recommend pt continue current diet of Dys. 3 textures and trials of regular textures with SLP only. Pt is in agreement and verbalized understanding. Continue with current plan of care.    FIM:  Comprehension Comprehension Mode: Auditory Comprehension: 5-Understands complex 90% of the time/Cues < 10% of the time Expression Expression Mode: Verbal Expression: 5-Expresses complex 90% of the time/cues < 10% of the time Social Interaction Social Interaction: 6-Interacts appropriately with others with medication or extra time (anti-anxiety, antidepressant). Problem Solving Problem Solving: 4-Solves basic 75 - 89% of the time/requires cueing 10 - 24% of the time Memory Memory: 5-Recognizes or recalls 90% of the time/requires cueing < 10% of the time FIM - Eating Eating Activity: 1: Helper feeds patient  Pain Pain Assessment Pain Assessment: No/denies pain  Therapy/Group: Individual Therapy  Gentry Pilson 09/19/2013, 12:35 PM

## 2013-09-19 NOTE — Progress Notes (Addendum)
Subjective/Complaints: Therapy noted some movement in triceps yesterday. Pt denies pain. Has questions about progrnosis and recovery  Review of Systems - Negative except weakness in all 4 limbs  Objective: Vital Signs: Blood pressure 130/52, pulse 79, temperature 97 F (36.1 C), temperature source Oral, resp. rate 18, height $RemoveBe'5\' 8"'gSTerSGFJ$  (1.727 m), weight 82 kg (180 lb 12.4 oz), SpO2 95.00%. No results found. Results for orders placed during the hospital encounter of 09/13/13 (from the past 72 hour(s))  GLUCOSE, CAPILLARY     Status: Abnormal   Collection Time    09/16/13 11:34 AM      Result Value Ref Range   Glucose-Capillary 243 (*) 70 - 99 mg/dL   Comment 1 Notify RN    GLUCOSE, CAPILLARY     Status: Abnormal   Collection Time    09/16/13  4:43 PM      Result Value Ref Range   Glucose-Capillary 212 (*) 70 - 99 mg/dL   Comment 1 Notify RN    GLUCOSE, CAPILLARY     Status: Abnormal   Collection Time    09/16/13  9:24 PM      Result Value Ref Range   Glucose-Capillary 260 (*) 70 - 99 mg/dL   Comment 1 Notify RN    GLUCOSE, CAPILLARY     Status: Abnormal   Collection Time    09/17/13  7:35 AM      Result Value Ref Range   Glucose-Capillary 184 (*) 70 - 99 mg/dL   Comment 1 Notify RN    BASIC METABOLIC PANEL     Status: Abnormal   Collection Time    09/17/13  9:20 AM      Result Value Ref Range   Sodium 130 (*) 137 - 147 mEq/L   Potassium 5.3  3.7 - 5.3 mEq/L   Chloride 97  96 - 112 mEq/L   CO2 20  19 - 32 mEq/L   Glucose, Bld 299 (*) 70 - 99 mg/dL   BUN 43 (*) 6 - 23 mg/dL   Creatinine, Ser 0.85  0.50 - 1.35 mg/dL   Calcium 7.6 (*) 8.4 - 10.5 mg/dL   GFR calc non Af Amer 80 (*) >90 mL/min   GFR calc Af Amer >90  >90 mL/min   Comment: (NOTE)     The eGFR has been calculated using the CKD EPI equation.     This calculation has not been validated in all clinical situations.     eGFR's persistently <90 mL/min signify possible Chronic Kidney     Disease.  CBC     Status:  Abnormal   Collection Time    09/17/13  9:20 AM      Result Value Ref Range   WBC 17.3 (*) 4.0 - 10.5 K/uL   RBC 3.37 (*) 4.22 - 5.81 MIL/uL   Hemoglobin 11.1 (*) 13.0 - 17.0 g/dL   HCT 31.7 (*) 39.0 - 52.0 %   MCV 94.1  78.0 - 100.0 fL   MCH 32.9  26.0 - 34.0 pg   MCHC 35.0  30.0 - 36.0 g/dL   RDW 13.8  11.5 - 15.5 %   Platelets 49 (*) 150 - 400 K/uL   Comment: SPECIMEN CHECKED FOR CLOTS     REPEATED TO VERIFY     CONSISTENT WITH PREVIOUS RESULT  GLUCOSE, CAPILLARY     Status: Abnormal   Collection Time    09/17/13 11:20 AM      Result Value Ref Range  Glucose-Capillary 294 (*) 70 - 99 mg/dL   Comment 1 Notify RN    GLUCOSE, CAPILLARY     Status: Abnormal   Collection Time    09/17/13  4:25 PM      Result Value Ref Range   Glucose-Capillary 154 (*) 70 - 99 mg/dL   Comment 1 Notify RN    GLUCOSE, CAPILLARY     Status: Abnormal   Collection Time    09/17/13  8:44 PM      Result Value Ref Range   Glucose-Capillary 220 (*) 70 - 99 mg/dL  BASIC METABOLIC PANEL     Status: Abnormal   Collection Time    09/18/13  5:02 AM      Result Value Ref Range   Sodium 135 (*) 137 - 147 mEq/L   Potassium 5.4 (*) 3.7 - 5.3 mEq/L   Chloride 100  96 - 112 mEq/L   CO2 22  19 - 32 mEq/L   Glucose, Bld 177 (*) 70 - 99 mg/dL   BUN 45 (*) 6 - 23 mg/dL   Creatinine, Ser 0.86  0.50 - 1.35 mg/dL   Calcium 8.1 (*) 8.4 - 10.5 mg/dL   GFR calc non Af Amer 79 (*) >90 mL/min   GFR calc Af Amer >90  >90 mL/min   Comment: (NOTE)     The eGFR has been calculated using the CKD EPI equation.     This calculation has not been validated in all clinical situations.     eGFR's persistently <90 mL/min signify possible Chronic Kidney     Disease.  GLUCOSE, CAPILLARY     Status: Abnormal   Collection Time    09/18/13  7:28 AM      Result Value Ref Range   Glucose-Capillary 181 (*) 70 - 99 mg/dL  GLUCOSE, CAPILLARY     Status: Abnormal   Collection Time    09/18/13 11:38 AM      Result Value Ref Range    Glucose-Capillary 287 (*) 70 - 99 mg/dL  GLUCOSE, CAPILLARY     Status: Abnormal   Collection Time    09/18/13  4:37 PM      Result Value Ref Range   Glucose-Capillary 211 (*) 70 - 99 mg/dL   Comment 1 Notify RN    GLUCOSE, CAPILLARY     Status: Abnormal   Collection Time    09/18/13  8:40 PM      Result Value Ref Range   Glucose-Capillary 201 (*) 70 - 99 mg/dL  BASIC METABOLIC PANEL     Status: Abnormal   Collection Time    09/19/13  5:42 AM      Result Value Ref Range   Sodium 133 (*) 137 - 147 mEq/L   Potassium 5.1  3.7 - 5.3 mEq/L   Chloride 99  96 - 112 mEq/L   CO2 21  19 - 32 mEq/L   Glucose, Bld 215 (*) 70 - 99 mg/dL   BUN 38 (*) 6 - 23 mg/dL   Creatinine, Ser 0.72  0.50 - 1.35 mg/dL   Calcium 8.3 (*) 8.4 - 10.5 mg/dL   GFR calc non Af Amer 85 (*) >90 mL/min   GFR calc Af Amer >90  >90 mL/min   Comment: (NOTE)     The eGFR has been calculated using the CKD EPI equation.     This calculation has not been validated in all clinical situations.     eGFR's persistently <90 mL/min signify possible  Chronic Kidney     Disease.  GLUCOSE, CAPILLARY     Status: Abnormal   Collection Time    09/19/13  7:16 AM      Result Value Ref Range   Glucose-Capillary 202 (*) 70 - 99 mg/dL   Comment 1 Notify RN        Constitutional: He is oriented to person, place, and time.  HENT: tongue midline, Multiple bruises to the face and forehead  Eyes: EOM are normal.  Neck:  Cervical collar in place  Cardiovascular: Normal rate and regular rhythm.  Respiratory: Effort normal and breath sounds normal. No respiratory distress.  GI: Soft. Bowel sounds are normal. He exhibits no distension.  Neurological: He is alert and oriented to person, place, and time.  Skin: bruising over face Left scalp staples intact motor strength is tr/5 in the right deltoid, 0 bicep, 1 tricep, 0 grip, WE trace  Motor strength is trace in the finger flexors on the left otherwise 0 in the deltoid bicep triceps   Right lower extremity 1+  minus hip knee extensor synergy 0/5 at the ankle dorsiflexor plantar flexors  Left lower extremity 2 minus hip flexor 3 minus knee extensor 1+ to 2 minus plantar flexor  Sensory intact to light touch in the right upper and left upper extremity (1/2), sensation slightly better in the LE's but inconsistent  No resting tone.   Absent to light touch right L5 and right S1 dermatomes  Intact to light touch left lower extremity    Assessment/Plan: 1. Functional deficits secondary to spastic tetraplegia central cord which require 3+ hours per day of interdisciplinary therapy in a comprehensive inpatient rehab setting. Physiatrist is providing close team supervision and 24 hour management of active medical problems listed below. Physiatrist and rehab team continue to assess barriers to discharge/monitor patient progress toward functional and medical goals. FIM: FIM - Bathing Bathing Steps Patient Completed:  (receives night baths with nursing) Bathing: 0: Activity did not occur  FIM - Upper Body Dressing/Undressing Upper body dressing/undressing steps patient completed:  (patient elected not to change into street clothing today) Upper body dressing/undressing: 0: Wears gown/pajamas-no public clothing FIM - Lower Body Dressing/Undressing Lower body dressing/undressing: 1: Total-Patient completed less than 25% of tasks  FIM - Toileting Toileting: 1: Total-Patient completed zero steps, helper did all 3  FIM - Air cabin crew Transfers: 0-Activity did not occur  FIM - Control and instrumentation engineer Devices: HOB elevated;Bed rails Bed/Chair Transfer: 1: Two helpers  FIM - Locomotion: Wheelchair Locomotion: Wheelchair: 1: Total Assistance/staff pushes wheelchair (Pt<25%) FIM - Locomotion: Ambulation Locomotion: Ambulation:  (unsafe to attempt)  Comprehension Comprehension Mode: Auditory Comprehension: 5-Understands complex 90% of the  time/Cues < 10% of the time  Expression Expression Mode: Verbal Expression: 5-Expresses complex 90% of the time/cues < 10% of the time  Social Interaction Social Interaction: 6-Interacts appropriately with others with medication or extra time (anti-anxiety, antidepressant).  Problem Solving Problem Solving: 4-Solves basic 75 - 89% of the time/requires cueing 10 - 24% of the time  Memory Memory: 5-Recognizes or recalls 90% of the time/requires cueing < 10% of the time  Medical Problem List and Plan:  1. 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 Status post C2-C4 fusion  2. DVT Prophylaxis/Anticoagulation: SCDs.  Dopplers negative 3. Pain Management/chronic back pain t: 300 mg at bedtime, Hydrocodone and Robaxin as needed. Monitor with increased mobility  4. Neuropsych: This patient is capable of making decisions on his own behalf.  5. History of CAD with angioplasty. No chest pain or shortness of breath  6. History of CVA. Plavix currently on hold discuss resuming neurosurgery soon 7. Diabetes mellitus with peripheral neuropathy. Monitor blood sugars closely while on Decadron taper. Lantus insulin  -titrated to 26 bid   8. Hypertension. Lasix 20 mg daily (held), Avapro 75 mg daily, Toprol 50 mg daily, Norvasc 5 mg daily. Monitor with increased mobility  9. Hyperlipidemia. Zocor  10. History of multiple myeloma. Followup hematology oncology Dr. Alvy Bimler as needed,--Restart revlimid? 11. Renal: continue IVF. Lasix still on hold  -recheck labs again in the am  -encourage pO  LOS (Days) 6 A FACE TO FACE EVALUATION WAS PERFORMED  SWARTZ,ZACHARY T 09/19/2013, 8:19 AM

## 2013-09-20 ENCOUNTER — Inpatient Hospital Stay (HOSPITAL_COMMUNITY): Payer: Medicare Other | Admitting: Physical Therapy

## 2013-09-20 ENCOUNTER — Encounter (HOSPITAL_COMMUNITY): Payer: Medicare Other | Admitting: *Deleted

## 2013-09-20 ENCOUNTER — Ambulatory Visit (HOSPITAL_COMMUNITY): Payer: Medicare Other

## 2013-09-20 ENCOUNTER — Inpatient Hospital Stay (HOSPITAL_COMMUNITY): Payer: Medicare Other

## 2013-09-20 ENCOUNTER — Inpatient Hospital Stay (HOSPITAL_COMMUNITY): Payer: Medicare Other | Admitting: Speech Pathology

## 2013-09-20 ENCOUNTER — Encounter (HOSPITAL_COMMUNITY): Payer: Medicare Other

## 2013-09-20 DIAGNOSIS — IMO0002 Reserved for concepts with insufficient information to code with codable children: Secondary | ICD-10-CM

## 2013-09-20 DIAGNOSIS — R413 Other amnesia: Secondary | ICD-10-CM

## 2013-09-20 LAB — CBC
HCT: 29.8 % — ABNORMAL LOW (ref 39.0–52.0)
HEMOGLOBIN: 10.1 g/dL — AB (ref 13.0–17.0)
MCH: 32.1 pg (ref 26.0–34.0)
MCHC: 33.9 g/dL (ref 30.0–36.0)
MCV: 94.6 fL (ref 78.0–100.0)
Platelets: 44 10*3/uL — ABNORMAL LOW (ref 150–400)
RBC: 3.15 MIL/uL — AB (ref 4.22–5.81)
RDW: 14.2 % (ref 11.5–15.5)
WBC: 24 10*3/uL — ABNORMAL HIGH (ref 4.0–10.5)

## 2013-09-20 LAB — GLUCOSE, CAPILLARY
Glucose-Capillary: 157 mg/dL — ABNORMAL HIGH (ref 70–99)
Glucose-Capillary: 152 mg/dL — ABNORMAL HIGH (ref 70–99)
Glucose-Capillary: 186 mg/dL — ABNORMAL HIGH (ref 70–99)
Glucose-Capillary: 160 mg/dL — ABNORMAL HIGH (ref 70–99)

## 2013-09-20 LAB — BASIC METABOLIC PANEL
BUN: 41 mg/dL — ABNORMAL HIGH (ref 6–23)
CO2: 21 mEq/L (ref 19–32)
Calcium: 8.1 mg/dL — ABNORMAL LOW (ref 8.4–10.5)
Chloride: 100 mEq/L (ref 96–112)
Creatinine, Ser: 0.79 mg/dL (ref 0.50–1.35)
GFR calc Af Amer: >90 mL/min (ref >90)
GFR, EST NON AFRICAN AMERICAN: 82 mL/min — AB (ref >90)
GLUCOSE: 175 mg/dL — AB (ref 70–99)
POTASSIUM: 5.4 meq/L — AB (ref 3.7–5.3)
SODIUM: 133 meq/L — AB (ref 137–147)

## 2013-09-20 MED ORDER — DEXAMETHASONE 4 MG PO TABS
4.0000 mg | ORAL_TABLET | Freq: Three times a day (TID) | ORAL | Status: DC
  Administered 2013-09-20 – 2013-09-23 (×9): 4 mg via ORAL
  Filled 2013-09-20 (×12): qty 1

## 2013-09-20 MED ORDER — INSULIN GLARGINE 100 UNIT/ML ~~LOC~~ SOLN
30.0000 [IU] | Freq: Two times a day (BID) | SUBCUTANEOUS | Status: DC
  Administered 2013-09-20 – 2013-09-29 (×19): 30 [IU] via SUBCUTANEOUS
  Filled 2013-09-20 (×23): qty 0.3

## 2013-09-20 NOTE — Progress Notes (Addendum)
Occupational Therapy Session Note  Patient Details  Name: John Carter MRN: [id number] Date of Birth: 09/15/1929  Today's Date: 09/20/2013 Time: 0930-1030 Time Calculation (min): 60 min  Short Term Goals: Week 1:  OT Short Term Goal 1 (Week 1): Patient will complete slide board transfer bed <> w/c with max assist X 1. OT Short Term Goal 2 (Week 1): Patient will demo ability to maintain static sitting balance in prep for assisted upper body dressing with mod assist. OT Short Term Goal 3 (Week 1): Patient will demo ability to actuate call lilght consistently using appropriate extremity independently. OT Short Term Goal 4 (Week 1): Patient will demo ability to actively move UE as gross assist during ADL.  Skilled Therapeutic Interventions/Progress Updates:  ADL-retraining with focus on bed mobility, static sitting balance, slide board transfers.   Patient received in bed, soiled with BM (no diaper). OT consulted with RN who advised change in bowel program to evening due to repeated BM incontinence.   Patient assisted with bed mobility by bending his knees and looking to his side during toilet hygiene but was unable to sustain flexion of elbow after having it placed over bed rail.   Patient is aware of discomfort during assisted positioning and provides feedback to staff as needed to reposition.   Spouse present during session and was educated on screening/assisting with inspection of possible pressure points as well as placement of call light.    Patient required total assist for lower body dressing, rising from supine to sitting at edge of bed, and slide board transfer to w/c.   Patient maintained static sitting balance, unsupported for 15 seconds during this session with evidence of mild correction to loss of balance anterior.        Therapy Documentation Precautions:  Precautions Precautions: Fall;Cervical Precaution Comments: Central cord presentation - with sitting pt has some spasms  causing quick forward lean so be cautious during sitting balance activities.  Required Braces or Orthoses: Cervical Brace Cervical Brace: Hard collar;At all times Restrictions Weight Bearing Restrictions: No  Pain: Pain Assessment Pain Assessment: 0-10 Pain Score: 3  Pain Type: Acute pain Pain Location: Shoulder (near [facility] joint) Pain Orientation: Right Pain Descriptors / Indicators: Jabbing Pain Frequency: Occasional Pain Onset: With Activity Patients Stated Pain Goal: 0 Pain Intervention(s): Repositioned  ADL: ADL ADL Comments: see FIM  See FIM for current functional status  Therapy/Group: Individual Therapy  Second session: Time: 1400-1430 Time Calculation (min):  30 min  Pain Assessment: No pain  Skilled Therapeutic Interventions: Co-treat with PT (PT session entered later during co-treat). NMR of bil UE using AAROM to address elbow flexion/extension, scapular depression, and shoulder extension.   Supine>Sit +2 total assist with PT/OT facilitation of weight shift and mechanics. Static sitting balance edge of mat: OT facilitating bil UE utilization for stability/PT facilitation of core stabilization - up to 20 sec static hold with bil hand hold assist, no trunk support. Sliding board transfer bed to/from w/c +2 assist with PT facilitating Lt quads during lateral scoot across board, OT assist with balance and progression across board.   See FIM for current functional status  Therapy/Group: Co-treatment Therapy  [place] 09/20/2013, 12:18 PM

## 2013-09-20 NOTE — Progress Notes (Addendum)
Physical Therapy Session Note  Patient Details  Name: John Carter MRN: [id number] Date of Birth: 02/04/1930  Today's Date: 09/20/2013 Time: 1430-1500 Time Calculation (min): 30 min (45 min total co-treat)  Short Term Goals: Week 1:  PT Short Term Goal 1 (Week 1): Pt will transfer bed<> wheelchair total assist +2 (pt = 15%) PT Short Term Goal 2 (Week 1): Pt will maintain static sitting balance x 1 min with mod assist PT Short Term Goal 3 (Week 1): Pt will direct w/c and sliding board set-up with mod verbal cues.   Skilled Therapeutic Interventions/Progress Updates:    Co-treat with OT (OT session started earlier than co-treat). Supine>Sit +2 total assist with PT/OT facilitation of weight shift and mechanics. Static sitting balance edge of mat: OT facilitating bil UE utilization for stability/PT facilitation of core stabilization - up to 20 sec static hold with bil hand hold assist, no trunk support. Sliding board transfer bed to/from w/c +2 assist with PT facilitating Lt quads during lateral scoot across board, OT assist with balance and progression across board. Pt able to self-direct w/c set-up and board placement with min verbal cues. Standing frame x 7 min continuous for bil LE weight bearing, stretching, and extension activation: Rt elbow flex/extension AAROM alternating with glute squeezes x 5 sec holds (great glute activation!).   Pt positioned in bed for comfort, call bell under collar - pt able to activate with ease.   Therapy Documentation Precautions:  Precautions Precautions: Fall;Cervical Precaution Comments: Central cord presentation - with sitting pt has some spasms causing quick forward lean so be cautious during sitting balance activities.  Required Braces or Orthoses: Cervical Brace Cervical Brace: Hard collar;At all times Restrictions Weight Bearing Restrictions: No Pain:  no c/o  See FIM for current functional status  Therapy/Group: Co-treatment  Therapy  [place] Rocker 09/20/2013, 3:16 PM

## 2013-09-20 NOTE — Progress Notes (Signed)
Speech Language Pathology Daily Session Note  Patient Details  Name: John Carter MRN: [id number] Date of Birth: [birth year]  Today's Date: 09/20/2013 Time: 1500-1520 Time Calculation (min): 20 min  Short Term Goals: Week 1: SLP Short Term Goal 1 (Week 1): Pt will recall swallowing compensatory strategies with supervision question cues.  SLP Short Term Goal 2 (Week 1): Pt will consume current diet with minimal overt s/s of aspiration with supervision verbal cues for utilization of swallowing compensatory strategies.   Skilled Therapeutic Interventions: Skilled treatment session focused on dysphagia goals. Pt consumed trials of thin liquids via straw without overt s/s of aspiration but declined trials of regular textures due to fatigue.  Pt reported fatigue and lethargy and able to direct care for positioning to increase comfort. Pt missed 10 minutes of session due to fatigue. Continue with current plan of care.    FIM:  Comprehension Comprehension Mode: Auditory Comprehension: 5-Understands complex 90% of the time/Cues < 10% of the time Expression Expression Mode: Verbal Expression: 5-Expresses complex 90% of the time/cues < 10% of the time Social Interaction Social Interaction: 6-Interacts appropriately with others with medication or extra time (anti-anxiety, antidepressant). Problem Solving Problem Solving: 5-Solves basic problems: With no assist Memory Memory: 5-Recognizes or recalls 90% of the time/requires cueing < 10% of the time FIM - Eating Eating Activity: 1: Helper feeds patient  Pain Pain Assessment Pain Assessment: No/denies pain  Therapy/Group: Individual Therapy  Celia Gibbons 09/20/2013, 3:34 PM

## 2013-09-20 NOTE — Progress Notes (Signed)
Subjective/Complaints: No new issues. Perhaps some increased movement on left. Good appetite, thirsty  Review of Systems - Negative except weakness in all 4 limbs  Objective: Vital Signs: Blood pressure 139/60, pulse 79, temperature 97.9 F (36.6 C), temperature source Oral, resp. rate 20, height 5\' 8"  (1.727 m), weight 87.317 kg (192 lb 8 oz), SpO2 96.00%. No results found. Results for orders placed during the hospital encounter of 09/13/13 (from the past 72 hour(s))  BASIC METABOLIC PANEL     Status: Abnormal   Collection Time    09/17/13  9:20 AM      Result Value Ref Range   Sodium 130 (*) 137 - 147 mEq/L   Potassium 5.3  3.7 - 5.3 mEq/L   Chloride 97  96 - 112 mEq/L   CO2 20  19 - 32 mEq/L   Glucose, Bld 299 (*) 70 - 99 mg/dL   BUN 43 (*) 6 - 23 mg/dL   Creatinine, Ser 11/17/13  0.50 - 1.35 mg/dL   Calcium 7.6 (*) 8.4 - 10.5 mg/dL   GFR calc non Af Amer 80 (*) >90 mL/min   GFR calc Af Amer >90  >90 mL/min   Comment: (NOTE)     The eGFR has been calculated using the CKD EPI equation.     This calculation has not been validated in all clinical situations.     eGFR's persistently <90 mL/min signify possible Chronic Kidney     Disease.  CBC     Status: Abnormal   Collection Time    09/17/13  9:20 AM      Result Value Ref Range   WBC 17.3 (*) 4.0 - 10.5 K/uL   RBC 3.37 (*) 4.22 - 5.81 MIL/uL   Hemoglobin 11.1 (*) 13.0 - 17.0 g/dL   HCT 11/17/13 (*) 35.0 - 75.7 %   MCV 94.1  78.0 - 100.0 fL   MCH 32.9  26.0 - 34.0 pg   MCHC 35.0  30.0 - 36.0 g/dL   RDW 32.2  56.7 - 20.9 %   Platelets 49 (*) 150 - 400 K/uL   Comment: SPECIMEN CHECKED FOR CLOTS     REPEATED TO VERIFY     CONSISTENT WITH PREVIOUS RESULT  GLUCOSE, CAPILLARY     Status: Abnormal   Collection Time    09/17/13 11:20 AM      Result Value Ref Range   Glucose-Capillary 294 (*) 70 - 99 mg/dL   Comment 1 Notify RN    GLUCOSE, CAPILLARY     Status: Abnormal   Collection Time    09/17/13  4:25 PM      Result Value Ref  Range   Glucose-Capillary 154 (*) 70 - 99 mg/dL   Comment 1 Notify RN    GLUCOSE, CAPILLARY     Status: Abnormal   Collection Time    09/17/13  8:44 PM      Result Value Ref Range   Glucose-Capillary 220 (*) 70 - 99 mg/dL  BASIC METABOLIC PANEL     Status: Abnormal   Collection Time    09/18/13  5:02 AM      Result Value Ref Range   Sodium 135 (*) 137 - 147 mEq/L   Potassium 5.4 (*) 3.7 - 5.3 mEq/L   Chloride 100  96 - 112 mEq/L   CO2 22  19 - 32 mEq/L   Glucose, Bld 177 (*) 70 - 99 mg/dL   BUN 45 (*) 6 - 23 mg/dL   Creatinine,  Ser 0.86  0.50 - 1.35 mg/dL   Calcium 8.1 (*) 8.4 - 10.5 mg/dL   GFR calc non Af Amer 79 (*) >90 mL/min   GFR calc Af Amer >90  >90 mL/min   Comment: (NOTE)     The eGFR has been calculated using the CKD EPI equation.     This calculation has not been validated in all clinical situations.     eGFR's persistently <90 mL/min signify possible Chronic Kidney     Disease.  GLUCOSE, CAPILLARY     Status: Abnormal   Collection Time    09/18/13  7:28 AM      Result Value Ref Range   Glucose-Capillary 181 (*) 70 - 99 mg/dL  GLUCOSE, CAPILLARY     Status: Abnormal   Collection Time    09/18/13 11:38 AM      Result Value Ref Range   Glucose-Capillary 287 (*) 70 - 99 mg/dL  GLUCOSE, CAPILLARY     Status: Abnormal   Collection Time    09/18/13  4:37 PM      Result Value Ref Range   Glucose-Capillary 211 (*) 70 - 99 mg/dL   Comment 1 Notify RN    GLUCOSE, CAPILLARY     Status: Abnormal   Collection Time    09/18/13  8:40 PM      Result Value Ref Range   Glucose-Capillary 201 (*) 70 - 99 mg/dL  BASIC METABOLIC PANEL     Status: Abnormal   Collection Time    09/19/13  5:42 AM      Result Value Ref Range   Sodium 133 (*) 137 - 147 mEq/L   Potassium 5.1  3.7 - 5.3 mEq/L   Chloride 99  96 - 112 mEq/L   CO2 21  19 - 32 mEq/L   Glucose, Bld 215 (*) 70 - 99 mg/dL   BUN 38 (*) 6 - 23 mg/dL   Creatinine, Ser 0.72  0.50 - 1.35 mg/dL   Calcium 8.3 (*) 8.4 -  10.5 mg/dL   GFR calc non Af Amer 85 (*) >90 mL/min   GFR calc Af Amer >90  >90 mL/min   Comment: (NOTE)     The eGFR has been calculated using the CKD EPI equation.     This calculation has not been validated in all clinical situations.     eGFR's persistently <90 mL/min signify possible Chronic Kidney     Disease.  GLUCOSE, CAPILLARY     Status: Abnormal   Collection Time    09/19/13  7:16 AM      Result Value Ref Range   Glucose-Capillary 202 (*) 70 - 99 mg/dL   Comment 1 Notify RN    GLUCOSE, CAPILLARY     Status: Abnormal   Collection Time    09/19/13 11:29 AM      Result Value Ref Range   Glucose-Capillary 256 (*) 70 - 99 mg/dL   Comment 1 Notify RN    GLUCOSE, CAPILLARY     Status: Abnormal   Collection Time    09/19/13  3:59 PM      Result Value Ref Range   Glucose-Capillary 166 (*) 70 - 99 mg/dL   Comment 1 STAT Lab     Comment 2 Notify RN    GLUCOSE, CAPILLARY     Status: Abnormal   Collection Time    09/19/13  8:27 PM      Result Value Ref Range   Glucose-Capillary 206 (*) 70 -  99 mg/dL   Comment 1 Notify RN    GLUCOSE, CAPILLARY     Status: Abnormal   Collection Time    09/19/13 10:53 PM      Result Value Ref Range   Glucose-Capillary 232 (*) 70 - 99 mg/dL   Comment 1 Notify RN    CBC     Status: Abnormal   Collection Time    09/20/13  5:12 AM      Result Value Ref Range   WBC 24.0 (*) 4.0 - 10.5 K/uL   RBC 3.15 (*) 4.22 - 5.81 MIL/uL   Hemoglobin 10.1 (*) 13.0 - 17.0 g/dL   HCT 29.8 (*) 39.0 - 52.0 %   MCV 94.6  78.0 - 100.0 fL   MCH 32.1  26.0 - 34.0 pg   MCHC 33.9  30.0 - 36.0 g/dL   RDW 14.2  11.5 - 15.5 %   Platelets 44 (*) 150 - 400 K/uL   Comment: SPECIMEN CHECKED FOR CLOTS     REPEATED TO VERIFY     CONSISTENT WITH PREVIOUS RESULT  BASIC METABOLIC PANEL     Status: Abnormal   Collection Time    09/20/13  5:12 AM      Result Value Ref Range   Sodium 133 (*) 137 - 147 mEq/L   Potassium 5.4 (*) 3.7 - 5.3 mEq/L   Chloride 100  96 - 112  mEq/L   CO2 21  19 - 32 mEq/L   Glucose, Bld 175 (*) 70 - 99 mg/dL   BUN 41 (*) 6 - 23 mg/dL   Creatinine, Ser 0.79  0.50 - 1.35 mg/dL   Calcium 8.1 (*) 8.4 - 10.5 mg/dL   GFR calc non Af Amer 82 (*) >90 mL/min   GFR calc Af Amer >90  >90 mL/min   Comment: (NOTE)     The eGFR has been calculated using the CKD EPI equation.     This calculation has not been validated in all clinical situations.     eGFR's persistently <90 mL/min signify possible Chronic Kidney     Disease.  GLUCOSE, CAPILLARY     Status: Abnormal   Collection Time    09/20/13  7:26 AM      Result Value Ref Range   Glucose-Capillary 157 (*) 70 - 99 mg/dL   Comment 1 Notify RN        Constitutional: He is oriented to person, place, and time.  HENT: tongue midline, Multiple bruises to the face and forehead  Eyes: EOM are normal.  Neck:  Cervical collar in place  Cardiovascular: Normal rate and regular rhythm.  Respiratory: Effort normal and breath sounds normal. No respiratory distress.  GI: Soft. Bowel sounds are normal. He exhibits no distension.  Neurological: He is alert and oriented to person, place, and time.  Skin: bruising over face Left scalp staples intact motor strength is tr/5 in the right deltoid, 0 bicep, 1 tricep, 0 grip, WE trace  Motor strength is trace in the finger flexors on the left otherwise 0 in the deltoid bicep triceps  Right lower extremity 1+  minus hip knee extensor synergy 0/5 at the ankle dorsiflexor plantar flexors  Left lower extremity 2 minus hip flexor 3 minus knee extensor 1+ to 2 minus plantar flexor  Sensory intact to light touch in the right upper and left upper extremity (1/2), sensation slightly better in the LE's but inconsistent  No resting tone.   Absent to light touch right L5  and right S1 dermatomes  Intact to light touch left lower extremity    Assessment/Plan: 1. Functional deficits secondary to spastic tetraplegia central cord which require 3+ hours per day of  interdisciplinary therapy in a comprehensive inpatient rehab setting. Physiatrist is providing close team supervision and 24 hour management of active medical problems listed below. Physiatrist and rehab team continue to assess barriers to discharge/monitor patient progress toward functional and medical goals. FIM: FIM - Bathing Bathing Steps Patient Completed:  (receives night baths with nursing) Bathing: 0: Activity did not occur  FIM - Upper Body Dressing/Undressing Upper body dressing/undressing steps patient completed:  (patient elected not to change into street clothing today) Upper body dressing/undressing: 1: Total-Patient completed less than 25% of tasks FIM - Lower Body Dressing/Undressing Lower body dressing/undressing: 1: Total-Patient completed less than 25% of tasks  FIM - Toileting Toileting: 1: Total-Patient completed zero steps, helper did all 3  FIM - Air cabin crew Transfers: 0-Activity did not occur  FIM - Control and instrumentation engineer Devices: [facility] elevated;Sliding board Bed/Chair Transfer: 2: Supine > Sit: Max A (lifting assist/Pt. 25-49%);1: Bed > Chair or W/C: Total A (helper does all/Pt. < 25%)  FIM - Locomotion: Wheelchair Locomotion: Wheelchair: 1: Total Assistance/staff pushes wheelchair (Pt<25%) FIM - Locomotion: Ambulation Locomotion: Ambulation:  (unsafe to attempt)  Comprehension Comprehension Mode: Auditory Comprehension: 5-Understands complex 90% of the time/Cues < 10% of the time  Expression Expression Mode: Verbal Expression: 5-Expresses complex 90% of the time/cues < 10% of the time  Social Interaction Social Interaction: 6-Interacts appropriately with others with medication or extra time (anti-anxiety, antidepressant).  Problem Solving Problem Solving: 4-Solves basic 75 - 89% of the time/requires cueing 10 - 24% of the time  Memory Memory: 5-Recognizes or recalls 90% of the time/requires cueing < 10% of the  time  Medical Problem List and Plan:  1. 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 Status post C2-C4 fusion  2. DVT Prophylaxis/Anticoagulation: SCDs.  Dopplers negative 3. Pain Management/chronic back pain t: 300 mg at bedtime, Hydrocodone and Robaxin as needed. Monitor with increased mobility  4. Neuropsych: This patient is capable of making decisions on his own behalf.  5. History of CAD with angioplasty. No chest pain or shortness of breath  6. History of CVA. Plavix currently on hold discuss resuming neurosurgery soon 7. Diabetes mellitus with peripheral neuropathy. Monitor blood sugars closely while on Decadron taper. Lantus insulin  -titrate to 30u bid   8. Hypertension. Lasix 20 mg daily (held), Avapro 75 mg daily, Toprol 50 mg daily, Norvasc 5 mg daily. Monitor with increased mobility  9. Hyperlipidemia. Zocor  10. History of multiple myeloma. Followup hematology oncology Dr. Alvy Bimler as needed,--Restart revlimid?  -WBC's elevated today---d/w h/o 11. Renal: continue IVF. Lasix still on hold  -serial labs  -encourage pO  LOS (Days) 7 A FACE TO FACE EVALUATION WAS PERFORMED  SWARTZ,ZACHARY T 09/20/2013, 8:33 AM
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
## 2013-09-20 NOTE — Progress Notes (Signed)
Physical Therapy Session Note  Patient Details  Name: John Carter MRN: [id number] Date of Birth: 1930/04/27  Today's Date: 09/20/2013 Time: 0930-1030 Time Calculation (min): 60 min  Short Term Goals: Week 1:  PT Short Term Goal 1 (Week 1): Pt will transfer bed<> wheelchair total assist +2 (pt = 15%) PT Short Term Goal 2 (Week 1): Pt will maintain static sitting balance x 1 min with mod assist PT Short Term Goal 3 (Week 1): Pt will direct w/c and sliding board set-up with mod verbal cues.   Skilled Therapeutic Interventions/Progress Updates:    Pt just finishing with OT, pt seated in w/c. Sliding board transfer w/c to/from mat performed with +2 total assist, PT cues for weight shift and recruitment of LEs. Static and dynamic sitting balance; elbows propped on bedside table and pt practiced flexing and extending trunk with assist from bil UEs through triceps activation - pt able to perform 5 reps with min assist then needs total assist rest due to fatigue. Pt had bowel incontinence in gym - returned to room and sliding board transfer w/c > bed +2 total assist (pt = 5-10%), sit > supine +2 total assist (pt= 5-10%). Rolling with +2 assist and use of pad to clean from BM.   Therapy Documentation Precautions:  Precautions Precautions: Fall;Cervical Precaution Comments: Central cord presentation - with sitting pt has some spasms causing quick forward lean so be cautious during sitting balance activities.  Required Braces or Orthoses: Cervical Brace Cervical Brace: Hard collar;At all times Restrictions Weight Bearing Restrictions: No Pain:  some pain in Rt clavicular region - pt reports ~3/10 and states he has had this pain since fall.   See FIM for current functional status  Therapy/Group: Co-Treatment with rec therapy  [place] 09/20/2013, 12:16 PM

## 2013-09-20 NOTE — Progress Notes (Signed)
Bowel program changed to eve's (1800) as pt's pattern has been BM 2-3 hours post supp. (when given at 0600); interferes with therapy. Foley patent, cloudy, UA, CS sent,pending. Wife reports noted pt with increased cough; some scattered rhonchi, clears with coughing; afebrile. CXR in AM.

## 2013-09-21 ENCOUNTER — Other Ambulatory Visit: Payer: Self-pay | Admitting: Hematology and Oncology

## 2013-09-21 ENCOUNTER — Encounter (HOSPITAL_COMMUNITY): Payer: Medicare Other | Admitting: *Deleted

## 2013-09-21 ENCOUNTER — Inpatient Hospital Stay (HOSPITAL_COMMUNITY): Payer: Medicare Other | Admitting: Physical Therapy

## 2013-09-21 ENCOUNTER — Inpatient Hospital Stay (HOSPITAL_COMMUNITY): Payer: Medicare Other

## 2013-09-21 ENCOUNTER — Inpatient Hospital Stay (HOSPITAL_COMMUNITY): Payer: Medicare Other | Admitting: Speech Pathology

## 2013-09-21 DIAGNOSIS — S022XXA Fracture of nasal bones, initial encounter for closed fracture: Secondary | ICD-10-CM

## 2013-09-21 DIAGNOSIS — S14121A Central cord syndrome at C1 level of cervical spinal cord, initial encounter: Secondary | ICD-10-CM

## 2013-09-21 DIAGNOSIS — W19XXXA Unspecified fall, initial encounter: Secondary | ICD-10-CM

## 2013-09-21 DIAGNOSIS — K592 Neurogenic bowel, not elsewhere classified: Secondary | ICD-10-CM

## 2013-09-21 DIAGNOSIS — N319 Neuromuscular dysfunction of bladder, unspecified: Secondary | ICD-10-CM

## 2013-09-21 LAB — CBC WITH DIFFERENTIAL/PLATELET
BASOS PCT: 0 % (ref 0–1)
Band Neutrophils: 0 % (ref 0–10)
Basophils Absolute: 0 10*3/uL (ref 0.0–0.1)
Blasts: 0 %
EOS ABS: 0 10*3/uL (ref 0.0–0.7)
EOS PCT: 0 % (ref 0–5)
HCT: 29.6 % — ABNORMAL LOW (ref 39.0–52.0)
Hemoglobin: 10.1 g/dL — ABNORMAL LOW (ref 13.0–17.0)
LYMPHS ABS: 0.9 10*3/uL (ref 0.7–4.0)
LYMPHS PCT: 4 % — AB (ref 12–46)
MCH: 32.6 pg (ref 26.0–34.0)
MCHC: 34.1 g/dL (ref 30.0–36.0)
MCV: 95.5 fL (ref 78.0–100.0)
Metamyelocytes Relative: 0 %
Monocytes Absolute: 0.7 10*3/uL (ref 0.1–1.0)
Monocytes Relative: 3 % (ref 3–12)
Myelocytes: 0 %
NEUTROS ABS: 21.9 10*3/uL — AB (ref 1.7–7.7)
NEUTROS PCT: 93 % — AB (ref 43–77)
NRBC: 0 /100{WBCs}
PLATELETS: 42 10*3/uL — AB (ref 150–400)
Promyelocytes Absolute: 0 %
RBC: 3.1 MIL/uL — AB (ref 4.22–5.81)
RDW: 14.7 % (ref 11.5–15.5)
WBC: 23.5 10*3/uL — ABNORMAL HIGH (ref 4.0–10.5)

## 2013-09-21 LAB — BASIC METABOLIC PANEL
BUN: 36 mg/dL — ABNORMAL HIGH (ref 6–23)
CO2: 21 mEq/L (ref 19–32)
CREATININE: 0.73 mg/dL (ref 0.50–1.35)
Calcium: 8 mg/dL — ABNORMAL LOW (ref 8.4–10.5)
Chloride: 102 mEq/L (ref 96–112)
GFR, EST NON AFRICAN AMERICAN: 85 mL/min — AB (ref >90)
GLUCOSE: 192 mg/dL — AB (ref 70–99)
Potassium: 5.3 mEq/L (ref 3.7–5.3)
Sodium: 134 mEq/L — ABNORMAL LOW (ref 137–147)

## 2013-09-21 LAB — GLUCOSE, CAPILLARY
GLUCOSE-CAPILLARY: 206 mg/dL — AB (ref 70–99)
Glucose-Capillary: 163 mg/dL — ABNORMAL HIGH (ref 70–99)
Glucose-Capillary: 170 mg/dL — ABNORMAL HIGH (ref 70–99)
Glucose-Capillary: 201 mg/dL — ABNORMAL HIGH (ref 70–99)

## 2013-09-21 MED ORDER — BISACODYL 10 MG RE SUPP
10.0000 mg | Freq: Every day | RECTAL | Status: DC
  Administered 2013-09-21 – 2013-10-09 (×17): 10 mg via RECTAL
  Filled 2013-09-21 (×19): qty 1

## 2013-09-21 NOTE — Plan of Care (Signed)
Problem: SCI BOWEL ELIMINATION Goal: RH STG MANAGE BOWEL WITH ASSISTANCE STG Manage Bowel with max Assistance.  Outcome: Not Progressing Requires total assist; suppository  Problem: SCI BLADDER ELIMINATION Goal: RH STG MANAGE BLADDER WITH EQUIPMENT WITH ASSISTANCE STG Manage Bladder With Equipment With mod Assistance  Outcome: Not Progressing Total assist of foley management

## 2013-09-21 NOTE — Progress Notes (Signed)
Physical Therapy Note  Patient Details  Name: John Carter MRN: [id number] Date of Birth: [birth year] Today's Date: 09/21/2013  Time 1: 1030-1126 56 minutes  1:1 No c/o pain.  Sliding board transfers with total +2 assist, pt able to assist 5% with triceps with cues.  Seated balance edge of mat with UEs propped on table with pt able to sit 60 and 90 seconds at a time with supervision.  Pt performed wt shifts forward/backward with min A, laterally with mod/max A.  Pt with much improved sitting balance and tolerance this session.  Time 2: 1300-1342 42 minutes  1:1 No c/o pain.  Treatment continued to focus on static sitting balance.  Pt able to sit 3-4 minutes multiple reps this session with increased trunk and UE strength noted, more able to self correct balance with UEs propped on table top. Sitting balance with UEs at side, pt only able to sit 10-15 seconds but more improved corrections of LOB noted.  Pt continues to be very motivated to improve.  Jecenia Leamer 09/21/2013, 11:42 AM

## 2013-09-21 NOTE — Progress Notes (Signed)
Speech Language Pathology Weekly Progress and Session Note  Patient Details  Name: John Carter MRN: [id number] Date of Birth: 1930-01-15  Today's Date: 09/21/2013 Time: 1430-1455 Time Calculation (min): 25 min  Short Term Goals: Week 1: SLP Short Term Goal 1 (Week 1): Pt will recall swallowing compensatory strategies with supervision question cues.  SLP Short Term Goal 1 - Progress (Week 1): Met SLP Short Term Goal 2 (Week 1): Pt will consume current diet with minimal overt s/s of aspiration with supervision verbal cues for utilization of swallowing compensatory strategies.  SLP Short Term Goal 2 - Progress (Week 1): Met    New Short Term Goals: Week 2: SLP Short Term Goal 1 (Week 2): Pt will utilize swallowing compensatory strategies with Mod I to minimize overt s/s of aspiration.  SLP Short Term Goal 2 (Week 2): Pt will recall swallowing compensatory strategies with Mod I.  SLP Short Term Goal 3 (Week 2): Pt will demonstrate efficient mastication of regular textures with minimal overt s/s of aspiration with supervision verbal cues.   Weekly Progress Updates: Pt has made excellent gains and has met 2 of 2 STG's this reporting period due to increased recall and utilization of swallowing compensatory strategies.  Currently, pt is consuming Dys. 3 textures with thin liquids without overt s/s of aspiration with supervision cues. Pt also continues to demonstrate intermittent overt s/s of aspiration with regular textures. Pt/family education ongoing. Pt would benefit from continued skilled SLP intervention to maximize swallow function with least restrictive diet.  Due to pt's current progress, SLP will change is plan of care to 3X per week.    Intensity: Minumum of 1-2 x/day, 30 to 90 minutes Frequency: 3 out of 7 days Duration/Length of Stay: 3 weeks Treatment/Interventions: English as a second language teacher;Dysphagia/aspiration precaution training;Environmental controls;Therapeutic  Activities;Patient/family education;Functional tasks   Daily Session  Skilled Therapeutic Interventions: Skilled treatment focused on patient/family education in regards to patient's current diet and appropriate textures. Handouts were also given to reinforce information with patient's wife. Patient verbalized understanding of all information presented. Pt also consumed thin liquids via straw without overt s/s of aspiration. Pt's vocal intensity appeared low at times, suspect due to fatigue, however, pt able to self-correct with Mod verbal cues. Continue with current plan of care.         FIM:  Comprehension Comprehension Mode: Auditory Comprehension: 5-Understands basic 90% of the time/requires cueing < 10% of the time Expression Expression Mode: Verbal Expression: 5-Expresses basic 90% of the time/requires cueing < 10% of the time. Social Interaction Social Interaction: 6-Interacts appropriately with others with medication or extra time (anti-anxiety, antidepressant). Problem Solving Problem Solving: 5-Solves basic problems: With no assist Memory Memory: 5-Recognizes or recalls 90% of the time/requires cueing < 10% of the time Pain No/Denies Pain  Therapy/Group: Individual Therapy  Abraham Margulies 09/21/2013, 3:38 PM

## 2013-09-21 NOTE — Progress Notes (Signed)
Subjective/Complaints: No complaints. Has questions about surgery. Stood yesterday in therapy.  Review of Systems - Negative except weakness in all 4 limbs  Objective: Vital Signs: Blood pressure 150/70, pulse 67, temperature 98.2 F (36.8 C), temperature source Oral, resp. rate 18, height _0  (1.727 m), weight 87.317 kg (192 lb 8 oz), SpO2 98.00%. No results found. Results for orders placed during the hospital encounter of 09/13/13 (from the past 72 hour(s))  GLUCOSE, CAPILLARY     Status: Abnormal   Collection Time    09/18/13 11:38 AM      Result Value Ref Range   Glucose-Capillary 287 (*) 70 - 99 mg/dL  GLUCOSE, CAPILLARY     Status: Abnormal   Collection Time    09/18/13  4:37 PM      Result Value Ref Range   Glucose-Capillary 211 (*) 70 - 99 mg/dL   Comment 1 Notify RN    GLUCOSE, CAPILLARY     Status: Abnormal   Collection Time    09/18/13  8:40 PM      Result Value Ref Range   Glucose-Capillary 201 (*) 70 - 99 mg/dL  BASIC METABOLIC PANEL     Status: Abnormal   Collection Time    09/19/13  5:42 AM      Result Value Ref Range   Sodium 133 (*) 137 - 147 mEq/L   Potassium 5.1  3.7 - 5.3 mEq/L   Chloride 99  96 - 112 mEq/L   CO2 21  19 - 32 mEq/L   Glucose, Bld 215 (*) 70 - 99 mg/dL   BUN 38 (*) 6 - 23 mg/dL   Creatinine, Ser 0.72  0.50 - 1.35 mg/dL   Calcium 8.3 (*) 8.4 - 10.5 mg/dL   GFR calc non Af Amer 85 (*) >90 mL/min   GFR calc Af Amer >90  >90 mL/min   Comment: (NOTE)     The eGFR has been calculated using the CKD EPI equation.     This calculation has not been validated in all clinical situations.     eGFR's persistently <90 mL/min signify possible Chronic Kidney     Disease.  GLUCOSE, CAPILLARY     Status: Abnormal   Collection Time    09/19/13  7:16 AM      Result Value Ref Range   Glucose-Capillary 202 (*) 70 - 99 mg/dL   Comment 1 Notify RN    GLUCOSE, CAPILLARY     Status: Abnormal   Collection Time    09/19/13 11:29 AM      Result Value Ref  Range   Glucose-Capillary 256 (*) 70 - 99 mg/dL   Comment 1 Notify RN    GLUCOSE, CAPILLARY     Status: Abnormal   Collection Time    09/19/13  3:59 PM      Result Value Ref Range   Glucose-Capillary 166 (*) 70 - 99 mg/dL   Comment 1 STAT Lab     Comment 2 Notify RN    GLUCOSE, CAPILLARY     Status: Abnormal   Collection Time    09/19/13  8:27 PM      Result Value Ref Range   Glucose-Capillary 206 (*) 70 - 99 mg/dL   Comment 1 Notify RN    GLUCOSE, CAPILLARY     Status: Abnormal   Collection Time    09/19/13 10:53 PM      Result Value Ref Range   Glucose-Capillary 232 (*) 70 - 99 mg/dL  Comment 1 Notify RN    CBC     Status: Abnormal   Collection Time    09/20/13  5:12 AM      Result Value Ref Range   WBC 24.0 (*) 4.0 - 10.5 K/uL   RBC 3.15 (*) 4.22 - 5.81 MIL/uL   Hemoglobin 10.1 (*) 13.0 - 17.0 g/dL   HCT 29.8 (*) 39.0 - 52.0 %   MCV 94.6  78.0 - 100.0 fL   MCH 32.1  26.0 - 34.0 pg   MCHC 33.9  30.0 - 36.0 g/dL   RDW 14.2  11.5 - 15.5 %   Platelets 44 (*) 150 - 400 K/uL   Comment: SPECIMEN CHECKED FOR CLOTS     REPEATED TO VERIFY     CONSISTENT WITH PREVIOUS RESULT  BASIC METABOLIC PANEL     Status: Abnormal   Collection Time    09/20/13  5:12 AM      Result Value Ref Range   Sodium 133 (*) 137 - 147 mEq/L   Potassium 5.4 (*) 3.7 - 5.3 mEq/L   Chloride 100  96 - 112 mEq/L   CO2 21  19 - 32 mEq/L   Glucose, Bld 175 (*) 70 - 99 mg/dL   BUN 41 (*) 6 - 23 mg/dL   Creatinine, Ser 0.79  0.50 - 1.35 mg/dL   Calcium 8.1 (*) 8.4 - 10.5 mg/dL   GFR calc non Af Amer 82 (*) >90 mL/min   GFR calc Af Amer >90  >90 mL/min   Comment: (NOTE)     The eGFR has been calculated using the CKD EPI equation.     This calculation has not been validated in all clinical situations.     eGFR's persistently <90 mL/min signify possible Chronic Kidney     Disease.  GLUCOSE, CAPILLARY     Status: Abnormal   Collection Time    09/20/13  7:26 AM      Result Value Ref Range    Glucose-Capillary 157 (*) 70 - 99 mg/dL   Comment 1 Notify RN    GLUCOSE, CAPILLARY     Status: Abnormal   Collection Time    09/20/13 11:22 AM      Result Value Ref Range   Glucose-Capillary 152 (*) 70 - 99 mg/dL   Comment 1 Notify RN    GLUCOSE, CAPILLARY     Status: Abnormal   Collection Time    09/20/13  4:41 PM      Result Value Ref Range   Glucose-Capillary 186 (*) 70 - 99 mg/dL   Comment 1 Notify RN    GLUCOSE, CAPILLARY     Status: Abnormal   Collection Time    09/20/13  9:20 PM      Result Value Ref Range   Glucose-Capillary 160 (*) 70 - 99 mg/dL  CBC WITH DIFFERENTIAL     Status: Abnormal (Preliminary result)   Collection Time    09/21/13  6:02 AM      Result Value Ref Range   WBC 23.5 (*) 4.0 - 10.5 K/uL   RBC 3.10 (*) 4.22 - 5.81 MIL/uL   Hemoglobin 10.1 (*) 13.0 - 17.0 g/dL   HCT 29.6 (*) 39.0 - 52.0 %   MCV 95.5  78.0 - 100.0 fL   MCH 32.6  26.0 - 34.0 pg   MCHC 34.1  30.0 - 36.0 g/dL   RDW 14.7  11.5 - 15.5 %   Platelets 42 (*) 150 - 400 K/uL  Comment: CONSISTENT WITH PREVIOUS RESULT   Neutrophils Relative % PENDING  43 - 77 %   Neutro Abs PENDING  1.7 - 7.7 K/uL   Band Neutrophils PENDING  0 - 10 %   Lymphocytes Relative PENDING  12 - 46 %   Lymphs Abs PENDING  0.7 - 4.0 K/uL   Monocytes Relative PENDING  3 - 12 %   Monocytes Absolute PENDING  0.1 - 1.0 K/uL   Eosinophils Relative PENDING  0 - 5 %   Eosinophils Absolute PENDING  0.0 - 0.7 K/uL   Basophils Relative PENDING  0 - 1 %   Basophils Absolute PENDING  0.0 - 0.1 K/uL   WBC Morphology PENDING     RBC Morphology PENDING     Smear Review PENDING     nRBC PENDING  0 /100 WBC   Metamyelocytes Relative PENDING     Myelocytes PENDING     Promyelocytes Absolute PENDING     Blasts PENDING    BASIC METABOLIC PANEL     Status: Abnormal   Collection Time    09/21/13  6:02 AM      Result Value Ref Range   Sodium 134 (*) 137 - 147 mEq/L   Potassium 5.3  3.7 - 5.3 mEq/L   Chloride 102  96 - 112  mEq/L   CO2 21  19 - 32 mEq/L   Glucose, Bld 192 (*) 70 - 99 mg/dL   BUN 36 (*) 6 - 23 mg/dL   Creatinine, Ser 0.73  0.50 - 1.35 mg/dL   Calcium 8.0 (*) 8.4 - 10.5 mg/dL   GFR calc non Af Amer 85 (*) >90 mL/min   GFR calc Af Amer >90  >90 mL/min   Comment: (NOTE)     The eGFR has been calculated using the CKD EPI equation.     This calculation has not been validated in all clinical situations.     eGFR's persistently <90 mL/min signify possible Chronic Kidney     Disease.  GLUCOSE, CAPILLARY     Status: Abnormal   Collection Time    09/21/13  7:21 AM      Result Value Ref Range   Glucose-Capillary 163 (*) 70 - 99 mg/dL      Constitutional: He is oriented to person, place, and time.  HENT: tongue midline, Multiple bruises to the face and forehead  Eyes: EOM are normal.  Neck:  Cervical collar in place  Cardiovascular: Normal rate and regular rhythm.  Respiratory: Effort normal and breath sounds normal. No respiratory distress.  GI: Soft. Bowel sounds are normal. He exhibits no distension.  Neurological: He is alert and oriented to person, place, and time.  Skin: bruising over face Left scalp staples intact motor strength is tr/5 in the right deltoid, 0 bicep, 1 tricep, 0 grip, WE trace  Motor strength is trace in the finger flexors on the left otherwise 0 in the deltoid bicep triceps  Right lower extremity 1+  minus hip knee extensor synergy 0/5 at the ankle dorsiflexor plantar flexors  Left lower extremity 2 minus hip flexor 3 minus knee extensor 1+ to 2 minus plantar flexor  Sensory intact to light touch in the right upper and left upper extremity (1/2), sensation slightly better in the LE's but inconsistent  No resting tone.   Absent to light touch right L5 and right S1 dermatomes  Intact to light touch left lower extremity    Assessment/Plan: 1. Functional deficits secondary to spastic tetraplegia  central cord which require 3+ hours per day of interdisciplinary therapy  in a comprehensive inpatient rehab setting. Physiatrist is providing close team supervision and 24 hour management of active medical problems listed below. Physiatrist and rehab team continue to assess barriers to discharge/monitor patient progress toward functional and medical goals. FIM: FIM - Bathing Bathing Steps Patient Completed:  (receives night baths with nursing) Bathing: 0: Activity did not occur  FIM - Upper Body Dressing/Undressing Upper body dressing/undressing steps patient completed:  (patient elected not to change into street clothing today) Upper body dressing/undressing: 1: Total-Patient completed less than 25% of tasks FIM - Lower Body Dressing/Undressing Lower body dressing/undressing: 1: Total-Patient completed less than 25% of tasks  FIM - Toileting Toileting: 1: Total-Patient completed zero steps, helper did all 3  FIM - Air cabin crew Transfers: 0-Activity did not occur  FIM - Control and instrumentation engineer Devices: [facility] elevated;Sliding board Bed/Chair Transfer: 1: Two helpers  FIM - Locomotion: Wheelchair Locomotion: Wheelchair: 1: Total Assistance/staff pushes wheelchair (Pt<25%) FIM - Locomotion: Ambulation Locomotion: Ambulation:  (unsafe to attempt)  Comprehension Comprehension Mode: Auditory Comprehension: 5-Understands complex 90% of the time/Cues < 10% of the time  Expression Expression Mode: Verbal Expression: 5-Expresses complex 90% of the time/cues < 10% of the time  Social Interaction Social Interaction: 6-Interacts appropriately with others with medication or extra time (anti-anxiety, antidepressant).  Problem Solving Problem Solving: 5-Solves basic problems: With no assist  Memory Memory: 5-Recognizes or recalls 90% of the time/requires cueing < 10% of the time  Medical Problem List and Plan:  1. 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 Status post C2-C4 fusion  2. DVT  Prophylaxis/Anticoagulation: SCDs.  Dopplers negative 3. Pain Management/chronic back pain t: 300 mg at bedtime, Hydrocodone and Robaxin as needed. Monitor with increased mobility  4. Neuropsych: This patient is capable of making decisions on his own behalf.  5. History of CAD with angioplasty. No chest pain or shortness of breath  6. History of CVA. Plavix currently on hold discuss resuming neurosurgery soon 7. Diabetes mellitus with peripheral neuropathy. Monitor blood sugars closely while on Decadron taper. Lantus insulin  -titrate to 30u bid   8. Hypertension. Lasix 20 mg daily (held), Avapro 75 mg daily, Toprol 50 mg daily, Norvasc 5 mg daily. Monitor with increased mobility  9. Hyperlipidemia. Zocor  10. History of multiple myeloma. I spoke to Dr. Alvy Bimler yesterday---no plans on resuming revlimid  -feels that changes in counts are reactive to stress/illness/steroids as opposed to MM  -UA neg, culture pending. cxr appears unremarkable  -WBC's still elevated but stable. Platelets slightly decreased (but they have been that low in the past)  -continue daily CBC and observation clinically 11. Renal: labs look a little better today---stop IVF  -Lasix still on hold  -serial labs  -encourage pO  LOS (Days) 8 A FACE TO FACE EVALUATION WAS PERFORMED  SWARTZ,ZACHARY T 09/21/2013, 8:19 AM

## 2013-09-21 NOTE — Progress Notes (Addendum)
Occupational Therapy Weekly Progress Note  Patient Details  Name: John Carter MRN: [id number] Date of Birth: 01/04/1930  Today's Date: 09/21/2013  Patient has met 3 of 4 short term goals.  Progressing with slide board transfer, currently total assist X 1  Patient continues to demonstrate the following deficits: Impaired UE function (tetraplegia), impaired sitting balance, impaired transfers, impaired endurance, and therefore will continue to benefit from skilled OT intervention to enhance overall performance with BADL.  Patient progressing toward long term goals..  Continue plan of care.  OT Short Term Goals Week 1:  OT Short Term Goal 1 (Week 1): Patient will complete slide board transfer bed <> w/c with max assist X 1. OT Short Term Goal 1 - Progress (Week 1): Partly met OT Short Term Goal 2 (Week 1): Patient will demo ability to maintain static sitting balance in prep for assisted upper body dressing with mod assist. OT Short Term Goal 2 - Progress (Week 1): Met OT Short Term Goal 3 (Week 1): Patient will demo ability to actuate call lilght consistently using appropriate extremity independently. OT Short Term Goal 3 - Progress (Week 1): Met OT Short Term Goal 4 (Week 1): Patient will demo ability to actively move UE as gross assist during ADL. OT Short Term Goal 4 - Progress (Week 1): Met Week 2:  OT Short Term Goal 1 (Week 2): Patient will verbalize 3 steps required for caregiver to complete slide board transfer bed <> w/c. OT Short Term Goal 2 (Week 2): Patient will demo ability to complete 25% of self-feeding with mod assist using AE and/or orthotics to stabilize wrist OT Short Term Goal 3 (Week 2): Patient will maintain static sitting balance at edge of bed for 30 seconds with min assist in prep for slide board transfer OT Short Term Goal 4 (Week 2): Patient will demo ability to pull either arm from bedrail when cued while sidelying in prep for assisted bed mobiltiy. OT Short  Term Goal 5 (Week 2): Patient will demo ability to lift either leg off bed to assist with lacing pants with extra time and min verbal cues  Therapy Documentation Precautions:  Precautions Precautions: Fall;Cervical Precaution Comments: Central cord presentation - with sitting pt has some spasms causing quick forward lean so be cautious during sitting balance activities.  Required Braces or Orthoses: Cervical Brace Cervical Brace: Hard collar;At all times Restrictions Weight Bearing Restrictions: No  Pain: 3/10, right shoulder (@ AC joint)    ADL: ADL ADL Comments: see FIM  [name] 09/21/2013, 3:51 PM

## 2013-09-22 ENCOUNTER — Encounter (HOSPITAL_COMMUNITY): Payer: Medicare Other | Admitting: *Deleted

## 2013-09-22 ENCOUNTER — Inpatient Hospital Stay (HOSPITAL_COMMUNITY): Payer: Medicare Other | Admitting: Physical Therapy

## 2013-09-22 DIAGNOSIS — S14121A Central cord syndrome at C1 level of cervical spinal cord, initial encounter: Secondary | ICD-10-CM

## 2013-09-22 DIAGNOSIS — K592 Neurogenic bowel, not elsewhere classified: Secondary | ICD-10-CM

## 2013-09-22 DIAGNOSIS — W19XXXA Unspecified fall, initial encounter: Secondary | ICD-10-CM

## 2013-09-22 DIAGNOSIS — N319 Neuromuscular dysfunction of bladder, unspecified: Secondary | ICD-10-CM

## 2013-09-22 DIAGNOSIS — S022XXA Fracture of nasal bones, initial encounter for closed fracture: Secondary | ICD-10-CM

## 2013-09-22 LAB — URINE CULTURE: Colony Count: >=100000

## 2013-09-22 LAB — CBC
HEMATOCRIT: 29.8 % — AB (ref 39.0–52.0)
Hemoglobin: 10.2 g/dL — ABNORMAL LOW (ref 13.0–17.0)
MCH: 33 pg (ref 26.0–34.0)
MCHC: 34.2 g/dL (ref 30.0–36.0)
MCV: 96.4 fL (ref 78.0–100.0)
Platelets: 38 10*3/uL — ABNORMAL LOW (ref 150–400)
RBC: 3.09 MIL/uL — AB (ref 4.22–5.81)
RDW: 14.6 % (ref 11.5–15.5)
WBC: 23.1 10*3/uL — ABNORMAL HIGH (ref 4.0–10.5)

## 2013-09-22 LAB — URINALYSIS, ROUTINE W REFLEX MICROSCOPIC
BILIRUBIN URINE: NEGATIVE
Glucose, UA: >1000 mg/dL — AB
Ketones, ur: NEGATIVE mg/dL
Nitrite: NEGATIVE
PH: 5 (ref 5.0–8.0)
Protein, ur: 30 mg/dL — AB
SPECIFIC GRAVITY, URINE: 1.026 (ref 1.005–1.030)
UROBILINOGEN UA: 0.2 mg/dL (ref 0.0–1.0)

## 2013-09-22 LAB — GLUCOSE, CAPILLARY
GLUCOSE-CAPILLARY: 147 mg/dL — AB (ref 70–99)
GLUCOSE-CAPILLARY: 184 mg/dL — AB (ref 70–99)
GLUCOSE-CAPILLARY: 228 mg/dL — AB (ref 70–99)
Glucose-Capillary: 213 mg/dL — ABNORMAL HIGH (ref 70–99)

## 2013-09-22 LAB — URINE MICROSCOPIC-ADD ON

## 2013-09-22 NOTE — Progress Notes (Signed)
Subjective/Complaints: No complaints. Has questions about CXR  Review of Systems - Negative except weakness in all 4 limbs  Objective: Vital Signs: Blood pressure 139/55, pulse 69, temperature 98 F (36.7 C), temperature source Oral, resp. rate 17, height $RemoveBe'5\' 8"'[id number]$  (1.727 m), weight 87.317 kg (192 lb 8 oz), SpO2 95.00%. Dg Chest 2 View  09/21/2013   CLINICAL DATA:  Cough, leukocytosis  EXAM: CHEST  2 VIEW  COMPARISON:  The 06/22/2011  FINDINGS: Left lower lobe atelectasis.  Minimal left effusion.  Cardiac enlargement without heart failure. Right lung is clear. Surgical staples in the right neck.  IMPRESSION: Left lower lobe atelectasis and small left effusion.   Electronically Signed   By: Franchot Gallo M.D.   On: 09/21/2013 08:50   Results for orders placed during the hospital encounter of 09/13/13 (from the past 72 hour(s))  GLUCOSE, CAPILLARY     Status: Abnormal   Collection Time    09/19/13 11:29 AM      Result Value Ref Range   Glucose-Capillary 256 (*) 70 - 99 mg/dL   Comment 1 Notify RN    GLUCOSE, CAPILLARY     Status: Abnormal   Collection Time    09/19/13  3:59 PM      Result Value Ref Range   Glucose-Capillary 166 (*) 70 - 99 mg/dL   Comment 1 STAT Lab     Comment 2 Notify RN    GLUCOSE, CAPILLARY     Status: Abnormal   Collection Time    09/19/13  8:27 PM      Result Value Ref Range   Glucose-Capillary 206 (*) 70 - 99 mg/dL   Comment 1 Notify RN    GLUCOSE, CAPILLARY     Status: Abnormal   Collection Time    09/19/13 10:53 PM      Result Value Ref Range   Glucose-Capillary 232 (*) 70 - 99 mg/dL   Comment 1 Notify RN    CBC     Status: Abnormal   Collection Time    09/20/13  5:12 AM      Result Value Ref Range   WBC 24.0 (*) 4.0 - 10.5 K/uL   RBC 3.15 (*) 4.22 - 5.81 MIL/uL   Hemoglobin 10.1 (*) 13.0 - 17.0 g/dL   HCT 29.8 (*) 39.0 - 52.0 %   MCV 94.6  78.0 - 100.0 fL   MCH 32.1  26.0 - 34.0 pg   MCHC 33.9  30.0 - 36.0 g/dL   RDW 14.2  11.5 - 15.5 %   Platelets 44 (*) 150 - 400 K/uL   Comment: SPECIMEN CHECKED FOR CLOTS     REPEATED TO VERIFY     CONSISTENT WITH PREVIOUS RESULT  BASIC METABOLIC PANEL     Status: Abnormal   Collection Time    09/20/13  5:12 AM      Result Value Ref Range   Sodium 133 (*) 137 - 147 mEq/L   Potassium 5.4 (*) 3.7 - 5.3 mEq/L   Chloride 100  96 - 112 mEq/L   CO2 21  19 - 32 mEq/L   Glucose, Bld 175 (*) 70 - 99 mg/dL   BUN 41 (*) 6 - 23 mg/dL   Creatinine, Ser 0.79  0.50 - 1.35 mg/dL   Calcium 8.1 (*) 8.4 - 10.5 mg/dL   GFR calc non Af Amer 82 (*) >90 mL/min   GFR calc Af Amer >90  >90 mL/min   Comment: (NOTE)  The eGFR has been calculated using the CKD EPI equation.     This calculation has not been validated in all clinical situations.     eGFR's persistently <90 mL/min signify possible Chronic Kidney     Disease.  GLUCOSE, CAPILLARY     Status: Abnormal   Collection Time    09/20/13  7:26 AM      Result Value Ref Range   Glucose-Capillary 157 (*) 70 - 99 mg/dL   Comment 1 Notify RN    GLUCOSE, CAPILLARY     Status: Abnormal   Collection Time    09/20/13 11:22 AM      Result Value Ref Range   Glucose-Capillary 152 (*) 70 - 99 mg/dL   Comment 1 Notify RN    URINE CULTURE     Status: None   Collection Time    09/20/13  3:26 PM      Result Value Ref Range   Specimen Description URINE, CLEAN CATCH     Special Requests NONE     Culture  Setup Time       Value: 09/20/2013 22:40     Performed at [facility] Count       Value: >=100,000 COLONIES/ML     Performed at [facility]   Culture       Value: GRAM NEGATIVE RODS     Performed at [facility]   Report Status PENDING    GLUCOSE, CAPILLARY     Status: Abnormal   Collection Time    09/20/13  4:41 PM      Result Value Ref Range   Glucose-Capillary 186 (*) 70 - 99 mg/dL   Comment 1 Notify RN    GLUCOSE, CAPILLARY     Status: Abnormal   Collection Time    09/20/13  9:20 PM      Result Value Ref  Range   Glucose-Capillary 160 (*) 70 - 99 mg/dL  CBC WITH DIFFERENTIAL     Status: Abnormal   Collection Time    09/21/13  6:02 AM      Result Value Ref Range   WBC 23.5 (*) 4.0 - 10.5 K/uL   RBC 3.10 (*) 4.22 - 5.81 MIL/uL   Hemoglobin 10.1 (*) 13.0 - 17.0 g/dL   HCT 85.1 (*) 87.9 - 91.1 %   MCV 95.5  78.0 - 100.0 fL   MCH 32.6  26.0 - 34.0 pg   MCHC 34.1  30.0 - 36.0 g/dL   RDW 79.9  40.5 - 00.3 %   Platelets 42 (*) 150 - 400 K/uL   Comment: CONSISTENT WITH PREVIOUS RESULT   Neutrophils Relative % 93 (*) 43 - 77 %   Lymphocytes Relative 4 (*) 12 - 46 %   Monocytes Relative 3  3 - 12 %   Eosinophils Relative 0  0 - 5 %   Basophils Relative 0  0 - 1 %   Band Neutrophils 0  0 - 10 %   Metamyelocytes Relative 0     Myelocytes 0     Promyelocytes Absolute 0     Blasts 0     nRBC 0  0 /100 WBC   Neutro Abs 21.9 (*) 1.7 - 7.7 K/uL   Lymphs Abs 0.9  0.7 - 4.0 K/uL   Monocytes Absolute 0.7  0.1 - 1.0 K/uL   Eosinophils Absolute 0.0  0.0 - 0.7 K/uL   Basophils Absolute 0.0  0.0 - 0.1 K/uL  WBC Morphology MILD LEFT SHIFT (1-5% METAS, OCC MYELO, OCC BANDS)    BASIC METABOLIC PANEL     Status: Abnormal   Collection Time    09/21/13  6:02 AM      Result Value Ref Range   Sodium 134 (*) 137 - 147 mEq/L   Potassium 5.3  3.7 - 5.3 mEq/L   Chloride 102  96 - 112 mEq/L   CO2 21  19 - 32 mEq/L   Glucose, Bld 192 (*) 70 - 99 mg/dL   BUN 36 (*) 6 - 23 mg/dL   Creatinine, Ser 0.73  0.50 - 1.35 mg/dL   Calcium 8.0 (*) 8.4 - 10.5 mg/dL   GFR calc non Af Amer 85 (*) >90 mL/min   GFR calc Af Amer >90  >90 mL/min   Comment: (NOTE)     The eGFR has been calculated using the CKD EPI equation.     This calculation has not been validated in all clinical situations.     eGFR's persistently <90 mL/min signify possible Chronic Kidney     Disease.  GLUCOSE, CAPILLARY     Status: Abnormal   Collection Time    09/21/13  7:21 AM      Result Value Ref Range   Glucose-Capillary 163 (*) 70 - 99  mg/dL  GLUCOSE, CAPILLARY     Status: Abnormal   Collection Time    09/21/13 11:17 AM      Result Value Ref Range   Glucose-Capillary 170 (*) 70 - 99 mg/dL  GLUCOSE, CAPILLARY     Status: Abnormal   Collection Time    09/21/13  4:20 PM      Result Value Ref Range   Glucose-Capillary 206 (*) 70 - 99 mg/dL  GLUCOSE, CAPILLARY     Status: Abnormal   Collection Time    09/21/13  8:57 PM      Result Value Ref Range   Glucose-Capillary 201 (*) 70 - 99 mg/dL   Comment 1 Notify RN    CBC     Status: Abnormal   Collection Time    09/22/13  4:24 AM      Result Value Ref Range   WBC 23.1 (*) 4.0 - 10.5 K/uL   RBC 3.09 (*) 4.22 - 5.81 MIL/uL   Hemoglobin 10.2 (*) 13.0 - 17.0 g/dL   HCT 29.8 (*) 39.0 - 52.0 %   MCV 96.4  78.0 - 100.0 fL   MCH 33.0  26.0 - 34.0 pg   MCHC 34.2  30.0 - 36.0 g/dL   RDW 14.6  11.5 - 15.5 %   Platelets 38 (*) 150 - 400 K/uL   Comment: CONSISTENT WITH PREVIOUS RESULT      Constitutional: He is oriented to person, place, and time.  HENT: tongue midline, Multiple bruises to the face and forehead  Eyes: EOM are normal.  Neck:  Cervical collar in place  Cardiovascular: Normal rate and regular rhythm.  Respiratory: Effort normal and breath sounds normal. No respiratory distress.  GI: Soft. Bowel sounds are normal. He exhibits no distension.  Neurological: He is alert and oriented to person, place, and time.  Skin: bruising over face Left scalp staples intact motor strength is tr/5 in the right deltoid, 0 bicep, 1 tricep, 0 grip, WE trace  Motor strength is trace in the finger flexors on the left otherwise 0 in the deltoid bicep triceps  Right lower extremity 1+  minus hip knee extensor synergy 0/5 at the ankle  dorsiflexor plantar flexors  Left lower extremity 2 minus hip flexor 3 minus knee extensor 1+ to 2 minus plantar flexor  Sensory intact to light touch in the right upper and left upper extremity (1/2), sensation slightly better in the LE's but  inconsistent  No resting tone.   Absent to light touch right L5 and right S1 dermatomes  Intact to light touch left lower extremity    Assessment/Plan: 1. Functional deficits secondary to spastic tetraplegia central cord which require 3+ hours per day of interdisciplinary therapy in a comprehensive inpatient rehab setting. Physiatrist is providing close team supervision and 24 hour management of active medical problems listed below. Physiatrist and rehab team continue to assess barriers to discharge/monitor patient progress toward functional and medical goals. FIM: FIM - Bathing Bathing Steps Patient Completed:  (receives night baths with nursing) Bathing: 0: Activity did not occur  FIM - Upper Body Dressing/Undressing Upper body dressing/undressing steps patient completed:  (patient elected not to change into street clothing today) Upper body dressing/undressing: 1: Total-Patient completed less than 25% of tasks FIM - Lower Body Dressing/Undressing Lower body dressing/undressing: 1: Total-Patient completed less than 25% of tasks  FIM - Toileting Toileting: 1: Total-Patient completed zero steps, helper did all 3  FIM - Air cabin crew Transfers: 0-Activity did not occur  FIM - Control and instrumentation engineer Devices: [facility] elevated;Sliding board Bed/Chair Transfer: 1: Two helpers  FIM - Locomotion: Wheelchair Locomotion: Wheelchair: 1: Total Assistance/staff pushes wheelchair (Pt<25%) FIM - Locomotion: Ambulation Locomotion: Ambulation:  (unsafe to attempt)  Comprehension Comprehension Mode: Auditory Comprehension: 5-Understands basic 90% of the time/requires cueing < 10% of the time  Expression Expression Mode: Verbal Expression: 5-Expresses basic 90% of the time/requires cueing < 10% of the time.  Social Interaction Social Interaction: 6-Interacts appropriately with others with medication or extra time (anti-anxiety, antidepressant).  Problem  Solving Problem Solving: 5-Solves basic problems: With no assist  Memory Memory: 5-Recognizes or recalls 90% of the time/requires cueing < 10% of the time  Medical Problem List and Plan:  1. 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 Status post C2-C4 fusion  2. DVT Prophylaxis/Anticoagulation: SCDs.  Dopplers negative 3. Pain Management/chronic back pain t: 300 mg at bedtime, Hydrocodone and Robaxin as needed. Monitor with increased mobility  4. Neuropsych: This patient is capable of making decisions on his own behalf.  5. History of CAD with angioplasty. No chest pain or shortness of breath  6. History of CVA. Plavix currently on hold discuss resuming neurosurgery soon 7. Diabetes mellitus with peripheral neuropathy. Monitor blood sugars closely while on Decadron taper. Lantus insulin  -titrate to 30u bid   8. Hypertension. Lasix 20 mg daily (held), Avapro 75 mg daily, Toprol 50 mg daily, Norvasc 5 mg daily. Monitor with increased mobility  9. Hyperlipidemia. Zocor  10. History of multiple myeloma. I spoke to Dr. Alvy Bimler yesterday---no plans on resuming revlimid  -feels that changes in counts are reactive to stress/illness/steroids as opposed to MM  -UA neg, culture pending. cxr appears unremarkable  -WBC's still elevated but stable. Platelets slightly decreased (but they have been that low in the past)  -continue daily CBC and observation clinically 11. Renal: labs look a little better today---stop IVF  -Lasix still on hold  -serial labs  -encourage pO  LOS (Days) 9 A FACE TO FACE EVALUATION WAS PERFORMED  Freddie Nghiem E 09/22/2013, 7:16 AM

## 2013-09-22 NOTE — Progress Notes (Signed)
Physical Therapy Weekly Progress Note  Patient Details  Name: John Carter MRN: [id number] Date of Birth: Dec 22, 1929  Today's Date: 09/22/2013 Time: 1103-1200 Time Calculation (min): 57 min  Patient has met 2 of 3 short term goals.  Pt currently requires +2 total assist for all bed mobility and sliding board transfers (pt=~10%). He has made minimal but noticeable gains in all four extremities however has very short muscular endurance. Therapies have been focused on transfer training, generalized strengthening, and sitting balance.   Patient continues to demonstrate the following deficits: impaired static and dynamic sitting balance, generalized strengthening, endurance/activity tolerance, impaired ability to contribute to transfers and therefore will continue to benefit from skilled PT intervention to enhance overall performance with activity tolerance, balance, postural control, ability to compensate for deficits and functional use of  right upper extremity, right lower extremity, left upper extremity and left lower extremity.  Patient progressing toward long term goals.Slowly.  Plan of care revisions: some long term goals downgraded due to slightly slower than expected progress.  PT Short Term Goals Week 1:  PT Short Term Goal 1 (Week 1): Pt will transfer bed<> wheelchair total assist +2 (pt = 15%) PT Short Term Goal 1 - Progress (Week 1): Not met PT Short Term Goal 2 (Week 1): Pt will maintain static sitting balance x 1 min with mod assist PT Short Term Goal 2 - Progress (Week 1): Met PT Short Term Goal 3 (Week 1): Pt will direct w/c and sliding board set-up with mod verbal cues.  PT Short Term Goal 3 - Progress (Week 1): Met  Skilled Therapeutic Interventions/Progress Updates:    Pt supine in bed at start, brief clean. Rolling to don pants +2 assist, appears to be more difficult to Rt. Supine>sit +2 assist pt = <5%, pt needs assist with bil LEs and trunk. Max assist for static  sitting balance edge of bed progressing to mod. 3 sliding board transfers performed during session, all +2 total assist (pt=~10%) however pt demonstrated a few noticeable improvements including attempting to rock 3x with trunk to use momentum when scoot across board and also noticeable hip extension activation with each scoot. Sitting balance and core strengthening edge of mat static with elbows propped >60 sec, added push/pull of rolling bedside table with elbows supported. Tendency to lean Rt, PT facilitation of Lt trunk flexors and Lt weight shift intermittently. Pt left in tilt-in-space positioned for comfort and pressure relief with family present, RN and NT aware of need for tilting w/c for pressure relief every 30 min.   Pt may benefit from trial in regular w/c (for therapy only) to trial w/c propulsion with LEs, encourage LE activation and reciprocal movement.   Therapy Documentation Precautions:  Precautions Precautions: Fall;Cervical Precaution Comments: Central cord presentation - with sitting pt has some spasms causing quick forward lean so be cautious during sitting balance activities.  Required Braces or Orthoses: Cervical Brace Cervical Brace: Hard collar;At all times Restrictions Weight Bearing Restrictions: No Pain: Rt clavicular pain, not rated no need for pain medicaiton  See FIM for current functional status  Therapy/Group: Individual Therapy  Lahoma Rocker 09/22/2013, 12:13 PM

## 2013-09-23 LAB — CBC
HCT: 29.8 % — ABNORMAL LOW (ref 39.0–52.0)
HEMOGLOBIN: 10.2 g/dL — AB (ref 13.0–17.0)
MCH: 32.9 pg (ref 26.0–34.0)
MCHC: 34.2 g/dL (ref 30.0–36.0)
MCV: 96.1 fL (ref 78.0–100.0)
Platelets: 42 10*3/uL — ABNORMAL LOW (ref 150–400)
RBC: 3.1 MIL/uL — ABNORMAL LOW (ref 4.22–5.81)
RDW: 15 % (ref 11.5–15.5)
WBC: 22.6 10*3/uL — ABNORMAL HIGH (ref 4.0–10.5)

## 2013-09-23 LAB — GLUCOSE, CAPILLARY
GLUCOSE-CAPILLARY: 173 mg/dL — AB (ref 70–99)
GLUCOSE-CAPILLARY: 208 mg/dL — AB (ref 70–99)
GLUCOSE-CAPILLARY: 184 mg/dL — AB (ref 70–99)

## 2013-09-23 MED ORDER — DEXAMETHASONE 4 MG PO TABS
4.0000 mg | ORAL_TABLET | Freq: Two times a day (BID) | ORAL | Status: DC
  Administered 2013-09-23 – 2013-10-08 (×30): 4 mg via ORAL
  Filled 2013-09-23 (×34): qty 1

## 2013-09-23 MED ORDER — TEMAZEPAM 7.5 MG PO CAPS
7.5000 mg | ORAL_CAPSULE | Freq: Every evening | ORAL | Status: DC | PRN
  Administered 2013-09-23 – 2013-09-26 (×3): 7.5 mg via ORAL
  Filled 2013-09-23 (×3): qty 1

## 2013-09-23 NOTE — Progress Notes (Addendum)
Subjective/Complaints: Poor sleep Denies SOB, pains, anxiety, or daytime naps  Review of Systems - Negative except weakness in all 4 limbs  Objective: Vital Signs: Blood pressure 143/66, pulse 87, temperature 98 F (36.7 C), temperature source Oral, resp. rate 18, height _0  (1.727 m), weight 87.317 kg (192 lb 8 oz), SpO2 96.00%. No results found. Results for orders placed during the hospital encounter of 09/13/13 (from the past 72 hour(s))  GLUCOSE, CAPILLARY     Status: Abnormal   Collection Time    09/20/13 11:22 AM      Result Value Ref Range   Glucose-Capillary 152 (*) 70 - 99 mg/dL   Comment 1 Notify RN    URINE CULTURE     Status: None   Collection Time    09/20/13  3:26 PM      Result Value Ref Range   Specimen Description URINE, CLEAN CATCH     Special Requests NONE     Culture  Setup Time       Value: 09/20/2013 22:40     Performed at [facility]       Value: >=100,000 COLONIES/ML     Performed at [facility]   Culture       Value: [facility] MORGANII     Performed at [facility]   Report Status 09/22/2013 FINAL     Organism ID, Bacteria MORGANELLA MORGANII    GLUCOSE, CAPILLARY     Status: Abnormal   Collection Time    09/20/13  4:41 PM      Result Value Ref Range   Glucose-Capillary 186 (*) 70 - 99 mg/dL   Comment 1 Notify RN    GLUCOSE, CAPILLARY     Status: Abnormal   Collection Time    09/20/13  9:20 PM      Result Value Ref Range   Glucose-Capillary 160 (*) 70 - 99 mg/dL  CBC WITH DIFFERENTIAL     Status: Abnormal   Collection Time    09/21/13  6:02 AM      Result Value Ref Range   WBC 23.5 (*) 4.0 - 10.5 K/uL   RBC 3.10 (*) 4.22 - 5.81 MIL/uL   Hemoglobin 10.1 (*) 13.0 - 17.0 g/dL   HCT 29.6 (*) 39.0 - 52.0 %   MCV 95.5  78.0 - 100.0 fL   MCH 32.6  26.0 - 34.0 pg   MCHC 34.1  30.0 - 36.0 g/dL   RDW 14.7  11.5 - 15.5 %   Platelets 42 (*) 150 - 400 K/uL   Comment: CONSISTENT WITH PREVIOUS RESULT   Neutrophils Relative % 93 (*) 43 - 77 %   Lymphocytes Relative 4 (*) 12 - 46 %   Monocytes Relative 3  3 - 12 %   Eosinophils Relative 0  0 - 5 %   Basophils Relative 0  0 - 1 %   Band Neutrophils 0  0 - 10 %   Metamyelocytes Relative 0     Myelocytes 0     Promyelocytes Absolute 0     Blasts 0     nRBC 0  0 /100 WBC   Neutro Abs 21.9 (*) 1.7 - 7.7 K/uL   Lymphs Abs 0.9  0.7 - 4.0 K/uL   Monocytes Absolute 0.7  0.1 - 1.0 K/uL   Eosinophils Absolute 0.0  0.0 - 0.7 K/uL   Basophils Absolute 0.0  0.0 - 0.1 K/uL   WBC Morphology MILD  LEFT SHIFT (1-5% METAS, OCC MYELO, OCC BANDS)    BASIC METABOLIC PANEL     Status: Abnormal   Collection Time    09/21/13  6:02 AM      Result Value Ref Range   Sodium 134 (*) 137 - 147 mEq/L   Potassium 5.3  3.7 - 5.3 mEq/L   Chloride 102  96 - 112 mEq/L   CO2 21  19 - 32 mEq/L   Glucose, Bld 192 (*) 70 - 99 mg/dL   BUN 36 (*) 6 - 23 mg/dL   Creatinine, Ser 0.73  0.50 - 1.35 mg/dL   Calcium 8.0 (*) 8.4 - 10.5 mg/dL   GFR calc non Af Amer 85 (*) >90 mL/min   GFR calc Af Amer >90  >90 mL/min   Comment: (NOTE)     The eGFR has been calculated using the CKD EPI equation.     This calculation has not been validated in all clinical situations.     eGFR's persistently <90 mL/min signify possible Chronic Kidney     Disease.  GLUCOSE, CAPILLARY     Status: Abnormal   Collection Time    09/21/13  7:21 AM      Result Value Ref Range   Glucose-Capillary 163 (*) 70 - 99 mg/dL  GLUCOSE, CAPILLARY     Status: Abnormal   Collection Time    09/21/13 11:17 AM      Result Value Ref Range   Glucose-Capillary 170 (*) 70 - 99 mg/dL  GLUCOSE, CAPILLARY     Status: Abnormal   Collection Time    09/21/13  4:20 PM      Result Value Ref Range   Glucose-Capillary 206 (*) 70 - 99 mg/dL  GLUCOSE, CAPILLARY     Status: Abnormal   Collection Time    09/21/13  8:57 PM      Result Value Ref Range   Glucose-Capillary 201 (*) 70 - 99 mg/dL   Comment 1 Notify RN    CBC      Status: Abnormal   Collection Time    09/22/13  4:24 AM      Result Value Ref Range   WBC 23.1 (*) 4.0 - 10.5 K/uL   RBC 3.09 (*) 4.22 - 5.81 MIL/uL   Hemoglobin 10.2 (*) 13.0 - 17.0 g/dL   HCT 29.8 (*) 39.0 - 52.0 %   MCV 96.4  78.0 - 100.0 fL   MCH 33.0  26.0 - 34.0 pg   MCHC 34.2  30.0 - 36.0 g/dL   RDW 14.6  11.5 - 15.5 %   Platelets 38 (*) 150 - 400 K/uL   Comment: CONSISTENT WITH PREVIOUS RESULT  GLUCOSE, CAPILLARY     Status: Abnormal   Collection Time    09/22/13  7:43 AM      Result Value Ref Range   Glucose-Capillary 147 (*) 70 - 99 mg/dL   Comment 1 Notify RN    URINALYSIS, ROUTINE W REFLEX MICROSCOPIC     Status: Abnormal   Collection Time    09/22/13  9:47 AM      Result Value Ref Range   Color, Urine YELLOW  YELLOW   APPearance CLOUDY (*) CLEAR   Specific Gravity, Urine 1.026  1.005 - 1.030   pH 5.0  5.0 - 8.0   Glucose, UA >1000 (*) NEGATIVE mg/dL   Hgb urine dipstick MODERATE (*) NEGATIVE   Bilirubin Urine NEGATIVE  NEGATIVE   Ketones, ur NEGATIVE  NEGATIVE mg/dL  Protein, ur 30 (*) NEGATIVE mg/dL   Urobilinogen, UA 0.2  0.0 - 1.0 mg/dL   Nitrite NEGATIVE  NEGATIVE   Leukocytes, UA MODERATE (*) NEGATIVE  URINE MICROSCOPIC-ADD ON     Status: Abnormal   Collection Time    09/22/13  9:47 AM      Result Value Ref Range   Squamous Epithelial / LPF RARE  RARE   WBC, UA 7-10  <3 WBC/hpf   RBC / HPF 3-6  <3 RBC/hpf   Bacteria, UA FEW (*) RARE  GLUCOSE, CAPILLARY     Status: Abnormal   Collection Time    09/22/13 12:03 PM      Result Value Ref Range   Glucose-Capillary 213 (*) 70 - 99 mg/dL   Comment 1 Notify RN    GLUCOSE, CAPILLARY     Status: Abnormal   Collection Time    09/22/13  4:50 PM      Result Value Ref Range   Glucose-Capillary 184 (*) 70 - 99 mg/dL   Comment 1 Notify RN    GLUCOSE, CAPILLARY     Status: Abnormal   Collection Time    09/22/13  8:54 PM      Result Value Ref Range   Glucose-Capillary 228 (*) 70 - 99 mg/dL   Comment 1  Notify RN    GLUCOSE, CAPILLARY     Status: Abnormal   Collection Time    09/23/13  6:13 AM      Result Value Ref Range   Glucose-Capillary 173 (*) 70 - 99 mg/dL   Comment 1 Notify RN    CBC     Status: Abnormal   Collection Time    09/23/13  6:51 AM      Result Value Ref Range   WBC 22.6 (*) 4.0 - 10.5 K/uL   RBC 3.10 (*) 4.22 - 5.81 MIL/uL   Hemoglobin 10.2 (*) 13.0 - 17.0 g/dL   HCT 29.8 (*) 39.0 - 52.0 %   MCV 96.1  78.0 - 100.0 fL   MCH 32.9  26.0 - 34.0 pg   MCHC 34.2  30.0 - 36.0 g/dL   RDW 15.0  11.5 - 15.5 %   Platelets 42 (*) 150 - 400 K/uL   Comment: CONSISTENT WITH PREVIOUS RESULT      Constitutional: He is oriented to person, place, and time.  HENT: tongue midline, Multiple bruises to the face and forehead  Eyes: EOM are normal.  Neck:  Cervical collar in place  Cardiovascular: Normal rate and regular rhythm.  Respiratory: Effort normal and breath sounds normal. No respiratory distress.  GI: Soft. Bowel sounds are normal. He exhibits no distension.  Neurological: He is alert and oriented to person, place, and time.  Skin: bruising over face Left scalp staples intact motor strength is tr/5 in the right deltoid, 0 bicep, 1 tricep, 0 grip, WE trace  Motor strength is trace in the finger flexors on the left otherwise 0 in the deltoid bicep triceps  Right lower extremity 1+  minus hip knee extensor synergy 0/5 at the ankle dorsiflexor plantar flexors  Left lower extremity 2 minus hip flexor 3 minus knee extensor 1+ to 2 minus plantar flexor  Sensory intact to light touch in the right upper and left upper extremity (1/2), sensation slightly better in the LE's but inconsistent  No resting tone.   Absent to light touch right L5 and right S1 dermatomes  Intact to light touch left lower extremity  Assessment/Plan: 1. Functional deficits secondary to spastic tetraplegia central cord which require 3+ hours per day of interdisciplinary therapy in a comprehensive  inpatient rehab setting. Physiatrist is providing close team supervision and 24 hour management of active medical problems listed below. Physiatrist and rehab team continue to assess barriers to discharge/monitor patient progress toward functional and medical goals. FIM: FIM - Bathing Bathing Steps Patient Completed:  (receives night baths with nursing) Bathing: 0: Activity did not occur  FIM - Upper Body Dressing/Undressing Upper body dressing/undressing steps patient completed:  (patient elected not to change into street clothing today) Upper body dressing/undressing: 1: Total-Patient completed less than 25% of tasks FIM - Lower Body Dressing/Undressing Lower body dressing/undressing: 1: Total-Patient completed less than 25% of tasks  FIM - Toileting Toileting: 1: Total-Patient completed zero steps, helper did all 3  FIM - Air cabin crew Transfers: 0-Activity did not occur  FIM - Control and instrumentation engineer Devices: [facility] elevated;Sliding board Bed/Chair Transfer: 1: Two helpers  FIM - Locomotion: Wheelchair Locomotion: Wheelchair: 1: Total Assistance/staff pushes wheelchair (Pt<25%) FIM - Locomotion: Ambulation Locomotion: Ambulation:  (unsafe to attempt)  Comprehension Comprehension Mode: Auditory Comprehension: 5-Understands basic 90% of the time/requires cueing < 10% of the time  Expression Expression Mode: Verbal Expression: 5-Expresses basic 90% of the time/requires cueing < 10% of the time.  Social Interaction Social Interaction: 6-Interacts appropriately with others with medication or extra time (anti-anxiety, antidepressant).  Problem Solving Problem Solving: 5-Solves basic problems: With no assist  Memory Memory: 5-Recognizes or recalls 90% of the time/requires cueing < 10% of the time  Medical Problem List and Plan:  1. 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 Status post C2-C4 fusion  2. DVT  Prophylaxis/Anticoagulation: SCDs.  Dopplers negative, no enoxaparin low plt 3. Pain Management/chronic back pain , Hydrocodone and Robaxin as needed. Monitor with increased mobility  4. Neuropsych: This patient is capable of making decisions on his own behalf.  5. History of CAD with angioplasty. No chest pain or shortness of breath  6. History of CVA. Plavix currently on hold discuss resuming neurosurgery soon 7. Diabetes mellitus with peripheral neuropathy. Monitor blood sugars closely while on Decadron taper. Lantus insulin  -titrate to 30u bid   8. Hypertension. Lasix 20 mg daily (held), Avapro 75 mg daily, Toprol 50 mg daily, Norvasc 5 mg daily. Monitor with increased mobility  9. Hyperlipidemia. Zocor  10. History of multiple myeloma. I spoke to Dr. Alvy Bimler yesterday---no plans on resuming revlimid  -feels that changes in counts are reactive to stress/illness/steroids as opposed to MM  -UA neg, culture pending. cxr appears unremarkable  -WBC's still elevated but stable. Platelets slightly decreased (but they have been that low in the past)  -continue daily CBC and observation clinically 11. Renal:   -Lasix still on hold  -serial labs  -encourage pO 12.  Insomnia, likely decadron related will reduce to BID, trial restoril  LOS (Days) 10 A FACE TO FACE EVALUATION WAS PERFORMED  John Carter 09/23/2013, 8:20 AM

## 2013-09-24 ENCOUNTER — Encounter (HOSPITAL_COMMUNITY): Payer: Medicare Other | Admitting: *Deleted

## 2013-09-24 ENCOUNTER — Ambulatory Visit: Payer: Medicare Other

## 2013-09-24 ENCOUNTER — Ambulatory Visit: Payer: Medicare Other | Admitting: Hematology and Oncology

## 2013-09-24 ENCOUNTER — Inpatient Hospital Stay (HOSPITAL_COMMUNITY): Payer: Medicare Other

## 2013-09-24 ENCOUNTER — Other Ambulatory Visit: Payer: Medicare Other

## 2013-09-24 ENCOUNTER — Inpatient Hospital Stay (HOSPITAL_COMMUNITY): Payer: Medicare Other | Admitting: Speech Pathology

## 2013-09-24 DIAGNOSIS — S14121A Central cord syndrome at C1 level of cervical spinal cord, initial encounter: Secondary | ICD-10-CM

## 2013-09-24 DIAGNOSIS — N319 Neuromuscular dysfunction of bladder, unspecified: Secondary | ICD-10-CM

## 2013-09-24 DIAGNOSIS — W19XXXA Unspecified fall, initial encounter: Secondary | ICD-10-CM

## 2013-09-24 DIAGNOSIS — S022XXA Fracture of nasal bones, initial encounter for closed fracture: Secondary | ICD-10-CM

## 2013-09-24 DIAGNOSIS — K592 Neurogenic bowel, not elsewhere classified: Secondary | ICD-10-CM

## 2013-09-24 LAB — GLUCOSE, CAPILLARY
GLUCOSE-CAPILLARY: 150 mg/dL — AB (ref 70–99)
Glucose-Capillary: 109 mg/dL — ABNORMAL HIGH (ref 70–99)
Glucose-Capillary: 163 mg/dL — ABNORMAL HIGH (ref 70–99)
Glucose-Capillary: 191 mg/dL — ABNORMAL HIGH (ref 70–99)

## 2013-09-24 LAB — CBC
HCT: 30.2 % — ABNORMAL LOW (ref 39.0–52.0)
Hemoglobin: 10.3 g/dL — ABNORMAL LOW (ref 13.0–17.0)
MCH: 33 pg (ref 26.0–34.0)
MCHC: 34.1 g/dL (ref 30.0–36.0)
MCV: 96.8 fL (ref 78.0–100.0)
Platelets: 41 10*3/uL — ABNORMAL LOW (ref 150–400)
RBC: 3.12 MIL/uL — ABNORMAL LOW (ref 4.22–5.81)
RDW: 15.3 % (ref 11.5–15.5)
WBC: 18.9 10*3/uL — ABNORMAL HIGH (ref 4.0–10.5)

## 2013-09-24 LAB — BASIC METABOLIC PANEL
BUN: 33 mg/dL — AB (ref 6–23)
CALCIUM: 7.7 mg/dL — AB (ref 8.4–10.5)
CO2: 22 mEq/L (ref 19–32)
Chloride: 104 mEq/L (ref 96–112)
Creatinine, Ser: 0.67 mg/dL (ref 0.50–1.35)
GFR, EST NON AFRICAN AMERICAN: 88 mL/min — AB (ref >90)
Glucose, Bld: 117 mg/dL — ABNORMAL HIGH (ref 70–99)
POTASSIUM: 4.6 meq/L (ref 3.7–5.3)
Sodium: 136 mEq/L — ABNORMAL LOW (ref 137–147)

## 2013-09-24 MED ORDER — CIPROFLOXACIN HCL 250 MG PO TABS
250.0000 mg | ORAL_TABLET | Freq: Two times a day (BID) | ORAL | Status: AC
  Administered 2013-09-24 – 2013-09-30 (×14): 250 mg via ORAL
  Filled 2013-09-24 (×16): qty 1

## 2013-09-24 NOTE — Progress Notes (Signed)
Physical Therapy Note  Patient Details  Name: John Carter MRN: [id number] Date of Birth: 01/01/1930 Today's Date: 09/24/2013  [id number] (55 minutes) individual Pain:: no reported pain Focus of treatment: transfer training; therapeutic activities focused on trunk control in sitting Treatment: Pt in wc upon arrival ; wc mobility - total assist ; transfers - sliding board total +2 assist wc >< mat; sitting edge of mat - pt performing lateral weight shifts (touch therapist shoulder) left > right and rhythmic stabilization forward; pt now able to maintain static position for approximately 3-4 minutes with close SBA but unable to correct loss of balance; returned to room with pressure sensitive call bell under cervical collar.   1345-1400 (15 minutes) cotreat with OT Pain : no reported pain Focus of treatment: Standing tolerance focusing on trunk control using Standing Frame Treatment: Pt up in wc upon arrival; Standing X 3 for 3-5 minutes using standing frame +2 assist and mod assist to attain trunk extension ; see OT note for activity; no complaint of dizziness; returned to room ; wc to bed total assist +2 using sliding board ; total assist +2 sit to supine.   Keagan Anthis,JIM 09/24/2013, 11:51 AM

## 2013-09-24 NOTE — Progress Notes (Signed)
The skilled treatment note has been reviewed and SLP is in agreement.  Germain Osgood, M.A. CCC-SLP [phone]

## 2013-09-24 NOTE — Progress Notes (Signed)
Subjective/Complaints: Poor sleep Denies SOB, pains, anxiety, or daytime naps  Review of Systems - Negative except weakness in all 4 limbs  Objective: Vital Signs: Blood pressure 137/57, pulse 77, temperature 97.9 F (36.6 C), temperature source Oral, resp. rate 20, height $RemoveBe'5\' 8"'[id number]$  (1.727 m), weight 87.317 kg (192 lb 8 oz), SpO2 96.00%. No results found. Results for orders placed during the hospital encounter of 09/13/13 (from the past 72 hour(s))  GLUCOSE, CAPILLARY     Status: Abnormal   Collection Time    09/21/13 11:17 AM      Result Value Ref Range   Glucose-Capillary 170 (*) 70 - 99 mg/dL  GLUCOSE, CAPILLARY     Status: Abnormal   Collection Time    09/21/13  4:20 PM      Result Value Ref Range   Glucose-Capillary 206 (*) 70 - 99 mg/dL  GLUCOSE, CAPILLARY     Status: Abnormal   Collection Time    09/21/13  8:57 PM      Result Value Ref Range   Glucose-Capillary 201 (*) 70 - 99 mg/dL   Comment 1 Notify RN    CBC     Status: Abnormal   Collection Time    09/22/13  4:24 AM      Result Value Ref Range   WBC 23.1 (*) 4.0 - 10.5 K/uL   RBC 3.09 (*) 4.22 - 5.81 MIL/uL   Hemoglobin 10.2 (*) 13.0 - 17.0 g/dL   HCT 29.8 (*) 39.0 - 52.0 %   MCV 96.4  78.0 - 100.0 fL   MCH 33.0  26.0 - 34.0 pg   MCHC 34.2  30.0 - 36.0 g/dL   RDW 14.6  11.5 - 15.5 %   Platelets 38 (*) 150 - 400 K/uL   Comment: CONSISTENT WITH PREVIOUS RESULT  GLUCOSE, CAPILLARY     Status: Abnormal   Collection Time    09/22/13  7:43 AM      Result Value Ref Range   Glucose-Capillary 147 (*) 70 - 99 mg/dL   Comment 1 Notify RN    URINALYSIS, ROUTINE W REFLEX MICROSCOPIC     Status: Abnormal   Collection Time    09/22/13  9:47 AM      Result Value Ref Range   Color, Urine YELLOW  YELLOW   APPearance CLOUDY (*) CLEAR   Specific Gravity, Urine 1.026  1.005 - 1.030   pH 5.0  5.0 - 8.0   Glucose, UA >1000 (*) NEGATIVE mg/dL   Hgb urine dipstick MODERATE (*) NEGATIVE   Bilirubin Urine NEGATIVE  NEGATIVE    Ketones, ur NEGATIVE  NEGATIVE mg/dL   Protein, ur 30 (*) NEGATIVE mg/dL   Urobilinogen, UA 0.2  0.0 - 1.0 mg/dL   Nitrite NEGATIVE  NEGATIVE   Leukocytes, UA MODERATE (*) NEGATIVE  URINE MICROSCOPIC-ADD ON     Status: Abnormal   Collection Time    09/22/13  9:47 AM      Result Value Ref Range   Squamous Epithelial / LPF RARE  RARE   WBC, UA 7-10  <3 WBC/hpf   RBC / HPF 3-6  <3 RBC/hpf   Bacteria, UA FEW (*) RARE  GLUCOSE, CAPILLARY     Status: Abnormal   Collection Time    09/22/13 12:03 PM      Result Value Ref Range   Glucose-Capillary 213 (*) 70 - 99 mg/dL   Comment 1 Notify RN    GLUCOSE, CAPILLARY     Status: Abnormal  Collection Time    09/22/13  4:50 PM      Result Value Ref Range   Glucose-Capillary 184 (*) 70 - 99 mg/dL   Comment 1 Notify RN    GLUCOSE, CAPILLARY     Status: Abnormal   Collection Time    09/22/13  8:54 PM      Result Value Ref Range   Glucose-Capillary 228 (*) 70 - 99 mg/dL   Comment 1 Notify RN    GLUCOSE, CAPILLARY     Status: Abnormal   Collection Time    09/23/13  6:13 AM      Result Value Ref Range   Glucose-Capillary 173 (*) 70 - 99 mg/dL   Comment 1 Notify RN    CBC     Status: Abnormal   Collection Time    09/23/13  6:51 AM      Result Value Ref Range   WBC 22.6 (*) 4.0 - 10.5 K/uL   RBC 3.10 (*) 4.22 - 5.81 MIL/uL   Hemoglobin 10.2 (*) 13.0 - 17.0 g/dL   HCT 29.8 (*) 39.0 - 52.0 %   MCV 96.1  78.0 - 100.0 fL   MCH 32.9  26.0 - 34.0 pg   MCHC 34.2  30.0 - 36.0 g/dL   RDW 15.0  11.5 - 15.5 %   Platelets 42 (*) 150 - 400 K/uL   Comment: CONSISTENT WITH PREVIOUS RESULT  GLUCOSE, CAPILLARY     Status: Abnormal   Collection Time    09/23/13 11:29 AM      Result Value Ref Range   Glucose-Capillary 208 (*) 70 - 99 mg/dL   Comment 1 Notify RN    GLUCOSE, CAPILLARY     Status: Abnormal   Collection Time    09/23/13  4:40 PM      Result Value Ref Range   Glucose-Capillary 184 (*) 70 - 99 mg/dL   Comment 1 Notify RN    CBC      Status: Abnormal   Collection Time    09/24/13  6:20 AM      Result Value Ref Range   WBC 18.9 (*) 4.0 - 10.5 K/uL   RBC 3.12 (*) 4.22 - 5.81 MIL/uL   Hemoglobin 10.3 (*) 13.0 - 17.0 g/dL   HCT 30.2 (*) 39.0 - 52.0 %   MCV 96.8  78.0 - 100.0 fL   MCH 33.0  26.0 - 34.0 pg   MCHC 34.1  30.0 - 36.0 g/dL   RDW 15.3  11.5 - 15.5 %   Platelets 41 (*) 150 - 400 K/uL   Comment: CONSISTENT WITH PREVIOUS RESULT  BASIC METABOLIC PANEL     Status: Abnormal   Collection Time    09/24/13  6:20 AM      Result Value Ref Range   Sodium 136 (*) 137 - 147 mEq/L   Potassium 4.6  3.7 - 5.3 mEq/L   Chloride 104  96 - 112 mEq/L   CO2 22  19 - 32 mEq/L   Glucose, Bld 117 (*) 70 - 99 mg/dL   BUN 33 (*) 6 - 23 mg/dL   Creatinine, Ser 0.67  0.50 - 1.35 mg/dL   Calcium 7.7 (*) 8.4 - 10.5 mg/dL   GFR calc non Af Amer 88 (*) >90 mL/min   GFR calc Af Amer >90  >90 mL/min   Comment: (NOTE)     The eGFR has been calculated using the CKD EPI equation.     This calculation  has not been validated in all clinical situations.     eGFR's persistently <90 mL/min signify possible Chronic Kidney     Disease.  GLUCOSE, CAPILLARY     Status: Abnormal   Collection Time    09/24/13  7:25 AM      Result Value Ref Range   Glucose-Capillary 109 (*) 70 - 99 mg/dL   Comment 1 Notify RN        Constitutional: He is oriented to person, place, and time.  HENT: tongue midline, Multiple bruises to the face and forehead  Eyes: EOM are normal.  Neck:  Cervical collar in place  Cardiovascular: Normal rate and regular rhythm.  Respiratory: Effort normal and breath sounds normal. No respiratory distress.  GI: Soft. Bowel sounds are normal. He exhibits no distension.  Neurological: He is alert and oriented to person, place, and time.  Skin: bruising over face Left scalp staples intact motor strength is tr/5 in the right deltoid, 0 bicep, 1 tricep, 0 grip, WE trace  Motor strength is trace in the finger flexors on the left  otherwise 0 in the deltoid bicep triceps  Right lower extremity 1+  minus hip knee extensor synergy 0/5 at the ankle dorsiflexor plantar flexors  Left lower extremity 2 minus hip flexor 3 minus knee extensor 1+ to 2 minus plantar flexor  Sensory intact to light touch in the right upper and left upper extremity (1/2), sensation slightly better in the LE's but inconsistent  No resting tone.   Absent to light touch right L5 and right S1 dermatomes  Intact to light touch left lower extremity    Assessment/Plan: 1. Functional deficits secondary to spastic tetraplegia central cord which require 3+ hours per day of interdisciplinary therapy in a comprehensive inpatient rehab setting. Physiatrist is providing close team supervision and 24 hour management of active medical problems listed below. Physiatrist and rehab team continue to assess barriers to discharge/monitor patient progress toward functional and medical goals. FIM: FIM - Bathing Bathing Steps Patient Completed:  (receives night baths with nursing) Bathing: 0: Activity did not occur  FIM - Upper Body Dressing/Undressing Upper body dressing/undressing steps patient completed:  (patient elected not to change into street clothing today) Upper body dressing/undressing: 1: Total-Patient completed less than 25% of tasks FIM - Lower Body Dressing/Undressing Lower body dressing/undressing: 1: Total-Patient completed less than 25% of tasks  FIM - Toileting Toileting: 1: Total-Patient completed zero steps, helper did all 3  FIM - Air cabin crew Transfers: 0-Activity did not occur  FIM - Control and instrumentation engineer Devices: [facility] elevated;Sliding board Bed/Chair Transfer: 1: Two helpers  FIM - Locomotion: Wheelchair Locomotion: Wheelchair: 1: Total Assistance/staff pushes wheelchair (Pt<25%) FIM - Locomotion: Ambulation Locomotion: Ambulation:  (unsafe to attempt)  Comprehension Comprehension Mode:  Auditory Comprehension: 5-Understands basic 90% of the time/requires cueing < 10% of the time  Expression Expression Mode: Verbal Expression: 5-Expresses basic 90% of the time/requires cueing < 10% of the time.  Social Interaction Social Interaction: 6-Interacts appropriately with others with medication or extra time (anti-anxiety, antidepressant).  Problem Solving Problem Solving: 5-Solves basic problems: With no assist  Memory Memory: 5-Recognizes or recalls 90% of the time/requires cueing < 10% of the time  Medical Problem List and Plan:  1. 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 Status post C2-C4 fusion  2. DVT Prophylaxis/Anticoagulation: SCDs.  Dopplers negative, no enoxaparin low plt 3. Pain Management/chronic back pain , Hydrocodone and Robaxin as needed. Monitor with increased mobility  4. Neuropsych: This patient is capable of making decisions on his own behalf.  5. History of CAD with angioplasty. No chest pain or shortness of breath  6. History of CVA. Plavix currently on hold discuss resuming neurosurgery soon 7. Diabetes mellitus with peripheral neuropathy. Monitor blood sugars closely while on Decadron taper. Lantus insulin  -titrated to 30u bid   -showing improvement--observe today 8. Hypertension. Lasix 20 mg daily (held), Avapro 75 mg daily, Toprol 50 mg daily, Norvasc 5 mg daily. Monitor with increased mobility  9. Hyperlipidemia. Zocor  10. History of multiple myeloma. I spoke to Dr. Alvy Bimler yesterday---no plans on resuming revlimid  -feels that changes in counts are reactive to stress/illness/steroids as opposed to MM  -morganella UTI---sens to cipro  -WBC's still elevated but stable. Platelets slightly decreased (but they have been that low in the past)  -continue daily CBC and observation clinically 11. Renal:   -Lasix still on hold  -serial labs  -encourage pO 12.  Insomnia, likely decadron related will reduce to BID, trial restoril  LOS  (Days) 11 A FACE TO FACE EVALUATION WAS PERFORMED  Armani Brar T 09/24/2013, 8:28 AM

## 2013-09-24 NOTE — Progress Notes (Addendum)
Occupational Therapy Session Note  Patient Details  Name: John Carter MRN: [id number] Date of Birth: 31-May-1930  Today's Date: 09/24/2013 Time: 0930-1030 Time Calculation (min): 60 min  Short Term Goals: Week 2:  OT Short Term Goal 1 (Week 2): Patient will verbalize 3 steps required for caregiver to complete slide board transfer bed <> w/c. OT Short Term Goal 2 (Week 2): Patient will demo ability to complete 25% of self-feeding with mod assist using AE and/or orthotics to stabilize wrist OT Short Term Goal 3 (Week 2): Patient will maintain static sitting balance at edge of bed for 30 seconds with min assist in prep for slide board transfer OT Short Term Goal 4 (Week 2): Patient will demo ability to pull either arm from bedrail when cued while sidelying in prep for assisted bed mobiltiy. OT Short Term Goal 5 (Week 2): Patient will demo ability to lift either leg off bed to assist with lacing pants with extra time and min verbal cues  Skilled Therapeutic Interventions/Progress Updates: ADL-retraining with focus on bed mobility, functional use of bil UE, static sitting balance, slide board transfer, self-feeding using orthotic (dorsal wrist splint with universal cuff).  Patient continues to participate in bed mobility and static static with modest improvement with static sitting (30 seconds maximum, unsupported sitting at edge of bed).  Patient demo'd active finger flexion at right hand this session and was educated on use of universal cuff for assisted self-feeding.   Patient consumed 1 cup of applesauce using universal cuff/splint with max assist to scoop food and bring utensil to his mouth (pt = 10%, elbow flexion).       Therapy Documentation Precautions:  Precautions Precautions: Fall;Cervical Precaution Comments: Central cord presentation - with sitting pt has some spasms causing quick forward lean so be cautious during sitting balance activities.  Required Braces or Orthoses:  Cervical Brace Cervical Brace: Hard collar;At all times Restrictions Weight Bearing Restrictions: No  Pain: Pain Assessment Pain Assessment: No/denies pain  ADL: ADL ADL Comments: see FIM  See FIM for current functional status  Therapy/Group: Individual Therapy  Second session: Time: 1400-1430 Time Calculation (min):  30 min  Pain Assessment: No report of pain  Skilled Therapeutic Interventions:  Co-treatment session with PTA addressing improved static standing using standing frame, bil UE exercises using hand skate, slide board transfer, and static sitting balance.   Patient received in w/c alert, in no pain and awaiting continued therapeutic treatment.  Patient was escorted to gym in his w/c and performed 3 sit<>stand using standing frame with +2 assist to provoke improved postural control, initiate weight-bearing through bil LE, and improved tolerance to static standing.   Patient required rest breaks between standing session and fatigued after 3rd stand, requesting bed rest for recovery.   After total assist transfer to bed, patient was re-educated on cervical collar precaution during cleaning and replacement of collar inserts.      See FIM for current functional status  Therapy/Group: CoTreatment Therapy  Tennie Grussing 09/24/2013, 10:41 AM

## 2013-09-24 NOTE — Progress Notes (Signed)
Recreational Therapy Session Note  Patient Details  Name: BENZION MESTA MRN: [id number] Date of Birth: 08/22/1929 Today's Date: 09/24/2013  Pain: no c/o Skilled Therapeutic Interventions/Progress Updates: Session focused on activity tolerance, slide board transfers, & static sitting balance.  Pt transferred w/c<->mat with slide board Total assist +2.  Pt sat EOM & practiced lateral weight shifts during leisure discussion with close supervision-total assist.  Pt able to maintain static sitting ~3 minutes/close supervion before needing total assist to recover LOB.  Therapy/Group: Co-Treatment Kinte Trim 09/24/2013, 12:52 PM

## 2013-09-24 NOTE — Progress Notes (Signed)
Speech Language Pathology Daily Session Note  Patient Details  Name: John Carter MRN: [id number] Date of Birth: 1929/08/09  Today's Date: 09/24/2013 Time: 0800-0830 Time Calculation (min): 30 min  Short Term Goals: Week 2: SLP Short Term Goal 1 (Week 2): Pt will utilize swallowing compensatory strategies with Mod I to minimize overt s/s of aspiration.  SLP Short Term Goal 2 (Week 2): Pt will recall swallowing compensatory strategies with Mod I.  SLP Short Term Goal 3 (Week 2): Pt will demonstrate efficient mastication of regular textures with minimal overt s/s of aspiration with supervision verbal cues.   Skilled Therapeutic Interventions: Skilled treatment session focused on dysphagia goals. Pt. consumed thin liquids via straw and Dys. 3 textures with intermittent throat clearing after liquids. Patient independent used safe swallow strategies. SLP provided education on vocal hygiene and incorporating vocal rest after using voice for an extended period of time. Continue with current plan of care.   FIM:  Comprehension Comprehension Mode: Auditory Comprehension: 5-Understands complex 90% of the time/Cues < 10% of the time Expression Expression Mode: Verbal Expression: 5-Expresses basic needs/ideas: With no assist Social Interaction Social Interaction: 6-Interacts appropriately with others with medication or extra time (anti-anxiety, antidepressant). Problem Solving Problem Solving: 5-Solves basic problems: With no assist Memory Memory: 5-Recognizes or recalls 90% of the time/requires cueing < 10% of the time FIM - Eating Eating Activity: 1: Helper feeds patient  Pain Pain Assessment Pain Assessment: No/denies pain  Therapy/Group: Individual Therapy  Yardley Lekas 09/24/2013, 8:40 AM

## 2013-09-25 ENCOUNTER — Inpatient Hospital Stay (HOSPITAL_COMMUNITY): Payer: Medicare Other

## 2013-09-25 ENCOUNTER — Inpatient Hospital Stay (HOSPITAL_COMMUNITY): Payer: Medicare Other | Admitting: *Deleted

## 2013-09-25 ENCOUNTER — Inpatient Hospital Stay (HOSPITAL_COMMUNITY): Payer: Medicare Other | Admitting: Physical Therapy

## 2013-09-25 ENCOUNTER — Encounter (HOSPITAL_COMMUNITY): Payer: Medicare Other | Admitting: *Deleted

## 2013-09-25 DIAGNOSIS — N319 Neuromuscular dysfunction of bladder, unspecified: Secondary | ICD-10-CM

## 2013-09-25 DIAGNOSIS — S022XXA Fracture of nasal bones, initial encounter for closed fracture: Secondary | ICD-10-CM

## 2013-09-25 DIAGNOSIS — S14121A Central cord syndrome at C1 level of cervical spinal cord, initial encounter: Secondary | ICD-10-CM

## 2013-09-25 DIAGNOSIS — K592 Neurogenic bowel, not elsewhere classified: Secondary | ICD-10-CM

## 2013-09-25 DIAGNOSIS — W19XXXA Unspecified fall, initial encounter: Secondary | ICD-10-CM

## 2013-09-25 LAB — GLUCOSE, CAPILLARY
GLUCOSE-CAPILLARY: 172 mg/dL — AB (ref 70–99)
GLUCOSE-CAPILLARY: 161 mg/dL — AB (ref 70–99)
GLUCOSE-CAPILLARY: 153 mg/dL — AB (ref 70–99)
Glucose-Capillary: 233 mg/dL — ABNORMAL HIGH (ref 70–99)
Glucose-Capillary: 207 mg/dL — ABNORMAL HIGH (ref 70–99)

## 2013-09-25 LAB — CBC
HEMATOCRIT: 29.4 % — AB (ref 39.0–52.0)
Hemoglobin: 9.9 g/dL — ABNORMAL LOW (ref 13.0–17.0)
MCH: 32.7 pg (ref 26.0–34.0)
MCHC: 33.7 g/dL (ref 30.0–36.0)
MCV: 97 fL (ref 78.0–100.0)
Platelets: 34 10*3/uL — ABNORMAL LOW (ref 150–400)
RBC: 3.03 MIL/uL — ABNORMAL LOW (ref 4.22–5.81)
RDW: 15.7 % — AB (ref 11.5–15.5)
WBC: 16.2 10*3/uL — AB (ref 4.0–10.5)

## 2013-09-25 NOTE — Progress Notes (Signed)
Recreational Therapy Session Note  Patient Details  Name: John Carter MRN: [id number] Date of Birth: 08/24/1929 Today's Date: 09/25/2013  Pain: no c/o Skilled Therapeutic Interventions/Progress Updates: session focused on activity tolerance, static sitting balance during TR/PT co-treat during leisure discussion.  Pt seated EOM with bedside table supporting each UE with close supervision-total assist.  Pt able to maintain balance ~1 minute at most.  Discussion also included pt's question about his recovery period and functional independence.    Therapy/Group: Co-Treatment   Felipe Cabell 09/25/2013, 4:17 PM

## 2013-09-25 NOTE — Progress Notes (Signed)
Physical Therapy Note  Patient Details  Name: John Carter MRN: [id number] Date of Birth: 23-Apr-1930 Today's Date: 09/25/2013  1130-1200 (30 minutes) individual Pain: no complaint of pain Focus of treatment: standing (standing frame) to facilitate weight bearing bilateral LEs/ trunk control Treatment: Pt up in wc upon arrival; sit to stand X 2 for 2 minutes in standing frame with total assist to maintain trunk extension ; returned to room secondary to incontinence (stool); wc to bed with sliding board total assist +2 ; total assist +2 for sit to supine; wife present.    1445-1540 (55 minutes) individual Pain: no reported pain Focus of treatment: Therapeutic activities focused on trunk control in sitting Treatment: transfers sliding board bed ><wc, wc ><mat total assist +2; sitting edge of mat with bedside table under each arm - isometric holds for trunk flexion (minimal) , trunk extension, lateral leans right (minimal), left ; pt able to maintain static position with increased lean to right for 1 minute or less; unable to regain loss of balance; pt reports increased fatigue this PM; returned to bed .   Suleman Gunning,JIM 09/25/2013, 11:59 AM

## 2013-09-25 NOTE — Patient Care Conference (Signed)
Inpatient RehabilitationTeam Conference and Plan of Care Update Date: 09/25/2013   Time: 2:05 PM    Patient Name: John Carter      Medical Record Number: [id number]  Date of Birth: [birth year] Sex: Male         Room/Bed: 4W18C/4W18C-01 Payor Info: Payor: MEDICARE / Plan: MEDICARE PART A AND B / Product Type: *No Product type* /    Admitting Diagnosis: CENTRAL CORD   Admit Date/Time:  09/13/2013  4:33 PM Admission Comments: No comment available   Primary Diagnosis:  <principal problem not specified> Principal Problem: <principal problem not specified>  Patient Active Problem List   Diagnosis Date Noted  . C3 spinal cord injury 09/11/2013  . Closed fracture nasal bone 09/05/2013  . Spondylosis, cervical, with myelopathy 09/05/2013  . Central cord syndrome 08/30/2013  . Thrombocytopenia 08/30/2013  . Anemia 08/30/2013  . Fall 08/30/2013  . Knee pain, acute 08/27/2013  . Other pancytopenia 04/16/2013  . Unspecified deficiency anemia 08/06/2011  . Postoperative anemia due to acute blood loss 07/01/2011  . Hypertension   . Hyperlipidemia   . Coronary artery disease   . Myocardial infarction   . Sleep apnea   . TIA (transient ischemic attack)   . Arthritis   . Chronic back pain   . GERD (gastroesophageal reflux disease)   . Hiatal hernia   . Gastric ulcer   . Diverticulosis   . Hemorrhoids   . History of colonic polyps   . Urinary frequency   . Nocturia   . Leg cramps   . Cataracts, bilateral   . Multiple myeloma 06/18/2009    Expected Discharge Date: Expected Discharge Date: 10/12/13  Team Members Present: Physician leading conference: Dr. Alger Simons Social Worker Present: Alfonse Alpers, LCSW Nurse Present: Other (comment) Whitman Hero, RN) PT Present: Canary Brim, PT OT Present: Salome Spotted, Starling Manns, OT;Patricia [place], OT PPS Coordinator present : Daiva Nakayama, RN, CRRN     Current Status/Progress Goal Weekly Team Focus  Medical   slow  progress. standing   working on bowels still.   education continues regarding SCI. bowel mgt   Bowel/Bladder   on bowel program. foley catheter in place  bowels daily with continued bowel program. continue to monitor for s/s of infection while patient is on foley catheter  monitor bowel and bladder.   Swallow/Nutrition/ Hydration   Dys. 3 textures with thin liquids, Mod I for directing self-feeing  Mod I with least restrictive diet  trials of regular textures   ADL's   Total assist ADL and slide board transfer (1), maintains static sitting balance with min assist in prep for transfer  Max A bathing and lower body dressing, Mod A upper body dressing, Min A dynamic sitting balance, Mod A transfers  Improved UE function, improved intellectual awareness, improved communication/direction with caregivers, sitting balance, weight-bearing, transfers   Mobility   +2 total assist for all mobility, some small gains in contribution during sliding board transfers  Mod assist sliding board transfer, max bed mobility -likely will be downgraded  NMR, sitting balance, stretching, rolling, transfers, upright tolerance.    Communication   at baseline  at baseline      Safety/Cognition/ Behavioral Observations            Pain   denies pain this shift.   pain less than 2  monitor for pain   Skin   incision to back to neck. honeycomb dressing per report. unable to assess d/t aspen collar in place.  no skin breakdown  reposition q2h prn  assess skin q shift.    Rehab Goals Patient on target to meet rehab goals: Yes *See Care Plan and progress notes for long and short-term goals.  Barriers to Discharge: awareness of severity, see priro    Possible Resolutions to Barriers:  see prior    Discharge Planning/Teaching Needs:  Plan continues for SNF following CIR.  Pt is making progress toward 1 caregiver goals.  Still working on bowel/bladder plan for pt.  None due to plan for SNF tx   Team Discussion:  Pt  continues to work toward goals of one caregiver level of care.  Pt is optimistic that things will improve, but MD has been educating pt of the severity of his condition so that pt can have a realistic picture.  Revisions to Treatment Plan:  None   Continued Need for Acute Rehabilitation Level of Care: The patient requires daily medical management by a physician with specialized training in physical medicine and rehabilitation for the following conditions: Daily direction of a multidisciplinary physical rehabilitation program to ensure safe treatment while eliciting the highest outcome that is of practical value to the patient.: Yes Daily medical management of patient stability for increased activity during participation in an intensive rehabilitation regime.: Yes Daily analysis of laboratory values and/or radiology reports with any subsequent need for medication adjustment of medical intervention for : Other;Neurological problems;Post surgical problems  Andri Prestia, Silvestre Mesi 09/27/2013, 11:54 PM

## 2013-09-25 NOTE — Progress Notes (Signed)
Subjective/Complaints: Better night. No complaints today. "wants to know if I'll walk out of here" Denies SOB, pains, anxiety, or daytime naps  Review of Systems - Negative except weakness in all 4 limbs  Objective: Vital Signs: Blood pressure 128/69, pulse 76, temperature 98.7 F (37.1 C), temperature source Oral, resp. rate 19, height 5' 8" (1.727 m), weight 87.317 kg (192 lb 8 oz), SpO2 95.00%. No results found. Results for orders placed during the hospital encounter of 09/13/13 (from the past 72 hour(s))  URINALYSIS, ROUTINE W REFLEX MICROSCOPIC     Status: Abnormal   Collection Time    09/22/13  9:47 AM      Result Value Ref Range   Color, Urine YELLOW  YELLOW   APPearance CLOUDY (*) CLEAR   Specific Gravity, Urine 1.026  1.005 - 1.030   pH 5.0  5.0 - 8.0   Glucose, UA >1000 (*) NEGATIVE mg/dL   Hgb urine dipstick MODERATE (*) NEGATIVE   Bilirubin Urine NEGATIVE  NEGATIVE   Ketones, ur NEGATIVE  NEGATIVE mg/dL   Protein, ur 30 (*) NEGATIVE mg/dL   Urobilinogen, UA 0.2  0.0 - 1.0 mg/dL   Nitrite NEGATIVE  NEGATIVE   Leukocytes, UA MODERATE (*) NEGATIVE  URINE MICROSCOPIC-ADD ON     Status: Abnormal   Collection Time    09/22/13  9:47 AM      Result Value Ref Range   Squamous Epithelial / LPF RARE  RARE   WBC, UA 7-10  <3 WBC/hpf   RBC / HPF 3-6  <3 RBC/hpf   Bacteria, UA FEW (*) RARE  GLUCOSE, CAPILLARY     Status: Abnormal   Collection Time    09/22/13 12:03 PM      Result Value Ref Range   Glucose-Capillary 213 (*) 70 - 99 mg/dL   Comment 1 Notify RN    GLUCOSE, CAPILLARY     Status: Abnormal   Collection Time    09/22/13  4:50 PM      Result Value Ref Range   Glucose-Capillary 184 (*) 70 - 99 mg/dL   Comment 1 Notify RN    GLUCOSE, CAPILLARY     Status: Abnormal   Collection Time    09/22/13  8:54 PM      Result Value Ref Range   Glucose-Capillary 228 (*) 70 - 99 mg/dL   Comment 1 Notify RN    GLUCOSE, CAPILLARY     Status: Abnormal   Collection Time   09/23/13  6:13 AM      Result Value Ref Range   Glucose-Capillary 173 (*) 70 - 99 mg/dL   Comment 1 Notify RN    CBC     Status: Abnormal   Collection Time    09/23/13  6:51 AM      Result Value Ref Range   WBC 22.6 (*) 4.0 - 10.5 K/uL   RBC 3.10 (*) 4.22 - 5.81 MIL/uL   Hemoglobin 10.2 (*) 13.0 - 17.0 g/dL   HCT 29.8 (*) 39.0 - 52.0 %   MCV 96.1  78.0 - 100.0 fL   MCH 32.9  26.0 - 34.0 pg   MCHC 34.2  30.0 - 36.0 g/dL   RDW 15.0  11.5 - 15.5 %   Platelets 42 (*) 150 - 400 K/uL   Comment: CONSISTENT WITH PREVIOUS RESULT  GLUCOSE, CAPILLARY     Status: Abnormal   Collection Time    09/23/13 11:29 AM      Result Value Ref Range  Glucose-Capillary 208 (*) 70 - 99 mg/dL   Comment 1 Notify RN    GLUCOSE, CAPILLARY     Status: Abnormal   Collection Time    09/23/13  4:40 PM      Result Value Ref Range   Glucose-Capillary 184 (*) 70 - 99 mg/dL   Comment 1 Notify RN    CBC     Status: Abnormal   Collection Time    09/24/13  6:20 AM      Result Value Ref Range   WBC 18.9 (*) 4.0 - 10.5 K/uL   RBC 3.12 (*) 4.22 - 5.81 MIL/uL   Hemoglobin 10.3 (*) 13.0 - 17.0 g/dL   HCT 30.2 (*) 39.0 - 52.0 %   MCV 96.8  78.0 - 100.0 fL   MCH 33.0  26.0 - 34.0 pg   MCHC 34.1  30.0 - 36.0 g/dL   RDW 15.3  11.5 - 15.5 %   Platelets 41 (*) 150 - 400 K/uL   Comment: CONSISTENT WITH PREVIOUS RESULT  BASIC METABOLIC PANEL     Status: Abnormal   Collection Time    09/24/13  6:20 AM      Result Value Ref Range   Sodium 136 (*) 137 - 147 mEq/L   Potassium 4.6  3.7 - 5.3 mEq/L   Chloride 104  96 - 112 mEq/L   CO2 22  19 - 32 mEq/L   Glucose, Bld 117 (*) 70 - 99 mg/dL   BUN 33 (*) 6 - 23 mg/dL   Creatinine, Ser 0.67  0.50 - 1.35 mg/dL   Calcium 7.7 (*) 8.4 - 10.5 mg/dL   GFR calc non Af Amer 88 (*) >90 mL/min   GFR calc Af Amer >90  >90 mL/min   Comment: (NOTE)     The eGFR has been calculated using the CKD EPI equation.     This calculation has not been validated in all clinical situations.      eGFR's persistently <90 mL/min signify possible Chronic Kidney     Disease.  GLUCOSE, CAPILLARY     Status: Abnormal   Collection Time    09/24/13  7:25 AM      Result Value Ref Range   Glucose-Capillary 109 (*) 70 - 99 mg/dL   Comment 1 Notify RN    GLUCOSE, CAPILLARY     Status: Abnormal   Collection Time    09/24/13 11:48 AM      Result Value Ref Range   Glucose-Capillary 150 (*) 70 - 99 mg/dL   Comment 1 Notify RN    GLUCOSE, CAPILLARY     Status: Abnormal   Collection Time    09/24/13  4:26 PM      Result Value Ref Range   Glucose-Capillary 163 (*) 70 - 99 mg/dL   Comment 1 Notify RN    GLUCOSE, CAPILLARY     Status: Abnormal   Collection Time    09/24/13  9:16 PM      Result Value Ref Range   Glucose-Capillary 191 (*) 70 - 99 mg/dL   Comment 1 Notify RN    CBC     Status: Abnormal   Collection Time    09/25/13  5:51 AM      Result Value Ref Range   WBC 16.2 (*) 4.0 - 10.5 K/uL   RBC 3.03 (*) 4.22 - 5.81 MIL/uL   Hemoglobin 9.9 (*) 13.0 - 17.0 g/dL   HCT 29.4 (*) 39.0 - 52.0 %  MCV 97.0  78.0 - 100.0 fL   MCH 32.7  26.0 - 34.0 pg   MCHC 33.7  30.0 - 36.0 g/dL   RDW 15.7 (*) 11.5 - 15.5 %   Platelets 34 (*) 150 - 400 K/uL   Comment: CONSISTENT WITH PREVIOUS RESULT  GLUCOSE, CAPILLARY     Status: Abnormal   Collection Time    09/25/13  7:28 AM      Result Value Ref Range   Glucose-Capillary 161 (*) 70 - 99 mg/dL   Comment 1 Notify RN        Constitutional: He is oriented to person, place, and time.  HENT: tongue midline, Multiple bruises to the face and forehead  Eyes: EOM are normal.  Neck:  Cervical collar in place  Cardiovascular: Normal rate and regular rhythm.  Respiratory: Effort normal and breath sounds normal. No respiratory distress.  GI: Soft. Bowel sounds are normal. He exhibits no distension.  Neurological: He is alert and oriented to person, place, and time.  Skin: bruising over face Left scalp staples intact motor strength is tr/5 in  the right deltoid, 0 bicep, 1 tricep, 0 grip, WE trace  Motor strength is trace in the finger flexors on the left otherwise 0 in the deltoid bicep triceps  Right lower extremity 1+  minus hip knee extensor synergy 0/5 at the ankle dorsiflexor plantar flexors  Left lower extremity 2 minus hip flexor 3 minus knee extensor 1+ to 2 minus plantar flexor  Sensory intact to light touch in the right upper and left upper extremity (1/2), sensation slightly better in the LE's but inconsistent  No resting tone.   Absent to light touch right L5 and right S1 dermatomes  Intact to light touch left lower extremity    Assessment/Plan: 1. Functional deficits secondary to spastic tetraplegia central cord which require 3+ hours per day of interdisciplinary therapy in a comprehensive inpatient rehab setting. Physiatrist is providing close team supervision and 24 hour management of active medical problems listed below. Physiatrist and rehab team continue to assess barriers to discharge/monitor patient progress toward functional and medical goals. FIM: FIM - Bathing Bathing Steps Patient Completed:  (receives night baths with nursing) Bathing: 1: Total-Patient completes 0-2 of 10 parts or less than 25%  FIM - Upper Body Dressing/Undressing Upper body dressing/undressing steps patient completed:  (patient elected not to change into street clothing today) Upper body dressing/undressing: 0: Wears gown/pajamas-no public clothing FIM - Lower Body Dressing/Undressing Lower body dressing/undressing: 0: Wears gown/pajamas-no public clothing  FIM - Toileting Toileting: 1: Total-Patient completed zero steps, helper did all 3  FIM - Air cabin crew Transfers: 0-Activity did not occur  FIM - Control and instrumentation engineer Devices: [facility] elevated;Sliding board Bed/Chair Transfer: 1: Two helpers  FIM - Locomotion: Wheelchair Locomotion: Wheelchair: 1: Total Assistance/staff pushes wheelchair  (Pt<25%) FIM - Locomotion: Ambulation Locomotion: Ambulation:  (unsafe to attempt)  Comprehension Comprehension Mode: Auditory Comprehension: 5-Understands basic 90% of the time/requires cueing < 10% of the time  Expression Expression Mode: Verbal Expression: 5-Expresses basic needs/ideas: With no assist  Social Interaction Social Interaction: 6-Interacts appropriately with others with medication or extra time (anti-anxiety, antidepressant).  Problem Solving Problem Solving: 5-Solves basic problems: With no assist  Memory Memory: 5-Recognizes or recalls 90% of the time/requires cueing < 10% of the time  Medical Problem List and Plan:  1. 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 Status post C2-C4 fusion  2. DVT Prophylaxis/Anticoagulation: SCDs.  Dopplers negative, no  enoxaparin low plt 3. Pain Management/chronic back pain , Hydrocodone and Robaxin as needed. Monitor with increased mobility  4. Neuropsych: This patient is capable of making decisions on his own behalf.  5. History of CAD with angioplasty. No chest pain or shortness of breath  6. History of CVA. Plavix currently on hold discuss resuming neurosurgery soon 7. Diabetes mellitus with peripheral neuropathy. Monitor blood sugars closely while on Decadron taper. Lantus insulin  -titrated to 30u bid   -showing improvement--observe today 8. Hypertension. Lasix 20 mg daily (held), Avapro 75 mg daily, Toprol 50 mg daily, Norvasc 5 mg daily. Monitor with increased mobility  9. Hyperlipidemia. Zocor  10. History of multiple myeloma. I spoke to Dr. Alvy Bimler yesterday---no plans on resuming revlimid  -feels that changes in counts are reactive to stress/illness/steroids as opposed to MM  -morganella UTI---sens to cipro  -WBC's coming down a bit.. Platelets at 34k  -continue daily CBC and observation clinically  11. Renal:   -Lasix still on hold  -serial labs  -encourage pO 12.  Insomnia: trial restoril  LOS  (Days) 12 A FACE TO FACE EVALUATION WAS PERFORMED  SWARTZ,ZACHARY T 09/25/2013, 8:23 AM

## 2013-09-25 NOTE — Progress Notes (Signed)
Occupational Therapy Session Note  Patient Details  Name: John Carter MRN: [id number] Date of Birth: 1930-07-03  Today's Date: 09/25/2013 Time: 0930-1030 Time Calculation (min): 60 min  Short Term Goals: Week 2:  OT Short Term Goal 1 (Week 2): Patient will verbalize 3 steps required for caregiver to complete slide board transfer bed <> w/c. OT Short Term Goal 2 (Week 2): Patient will demo ability to complete 25% of self-feeding with mod assist using AE and/or orthotics to stabilize wrist OT Short Term Goal 3 (Week 2): Patient will maintain static sitting balance at edge of bed for 30 seconds with min assist in prep for slide board transfer OT Short Term Goal 4 (Week 2): Patient will demo ability to pull either arm from bedrail when cued while sidelying in prep for assisted bed mobiltiy. OT Short Term Goal 5 (Week 2): Patient will demo ability to lift either leg off bed to assist with lacing pants with extra time and min verbal cues  Skilled Therapeutic Interventions/Progress Updates:  ADL-retraining with focus on improved UE function, caregiver training on use of orthotics/AE for self-feeding, bed mobility, transfers.   Patient received supine in bed, alert and awaiting treatment, spouse present at bedside.   Patient was soiled from [facility] with no diaper.   OT provided total assist for toilet hygiene with patient contributing minimally with head turns and with LE with max facilitation to lift shoulders and hips during bed rolling, right to left.  After assisted lower body dressing, patient required total assist to rise from supine to sitting at edge of bed.  At edge of bed, patient completed anterior/posterior leans to maintain static sitting balance for up to 32 seconds, with OT and spouse stabilizing patient's hands on his knees to facilitate improved elbow flexion/extension during attempted trunk righting.   Patient completed slide board transfer from bed to w/c with total assist in prep for  training with spouse on use of dorsal wrist splint w/cuff for improved right UE function during assisted self-feeding.   Spouse able to don/doff splint and assist with self-feeding with supervision for technique.  Patient left in his w/c with spouse and RN present to administer meds.    Therapy Documentation Precautions:  Precautions Precautions: Fall;Cervical Precaution Comments: Central cord presentation - with sitting pt has some spasms causing quick forward lean so be cautious during sitting balance activities.  Required Braces or Orthoses: Cervical Brace Cervical Brace: Hard collar;At all times Restrictions Weight Bearing Restrictions: No  Pain: Pain Assessment Pain Assessment: No/denies pain  ADL: ADL ADL Comments: see FIM  See FIM for current functional status  Therapy/Group: Individual Therapy  Second session: Time: 1330-1400 Time Calculation (min):  30in  Pain Assessment: No pain  Skilled Therapeutic Interventions: Therapeutic activity with focus on improved functional use of bil UE.   With hand guidance and support of elbow and wrist, patient completed facilitated active assisted range of motion exercises in prep for and simulating improved participation in self-feeding and improved participation upper body dressing.   Patient demo's 1+ /5 elbow extension at right UE, sufficient for up to 10% of effort to bring utensil to his mouth.   Left UE was 2-/5 at elbow extension with trace finger flexion/extension noted.   Patient completed bil UE exercise, 10 reps with 3 - 4 rest breaks required due to fatigue from exertion during activity.   See FIM for current functional status  Therapy/Group: Individual Therapy  [place] 09/25/2013, 12:39 PM

## 2013-09-26 ENCOUNTER — Inpatient Hospital Stay (HOSPITAL_COMMUNITY): Payer: Medicare Other

## 2013-09-26 ENCOUNTER — Encounter (HOSPITAL_COMMUNITY): Payer: Medicare Other | Admitting: *Deleted

## 2013-09-26 ENCOUNTER — Ambulatory Visit (HOSPITAL_COMMUNITY): Payer: Medicare Other | Admitting: *Deleted

## 2013-09-26 ENCOUNTER — Inpatient Hospital Stay (HOSPITAL_COMMUNITY): Payer: Medicare Other | Admitting: Speech Pathology

## 2013-09-26 DIAGNOSIS — W19XXXA Unspecified fall, initial encounter: Secondary | ICD-10-CM

## 2013-09-26 DIAGNOSIS — N319 Neuromuscular dysfunction of bladder, unspecified: Secondary | ICD-10-CM

## 2013-09-26 DIAGNOSIS — S14121A Central cord syndrome at C1 level of cervical spinal cord, initial encounter: Secondary | ICD-10-CM

## 2013-09-26 DIAGNOSIS — K592 Neurogenic bowel, not elsewhere classified: Secondary | ICD-10-CM

## 2013-09-26 DIAGNOSIS — S022XXA Fracture of nasal bones, initial encounter for closed fracture: Secondary | ICD-10-CM

## 2013-09-26 LAB — CBC
HCT: 30.1 % — ABNORMAL LOW (ref 39.0–52.0)
Hemoglobin: 10.1 g/dL — ABNORMAL LOW (ref 13.0–17.0)
MCH: 32.8 pg (ref 26.0–34.0)
MCHC: 33.6 g/dL (ref 30.0–36.0)
MCV: 97.7 fL (ref 78.0–100.0)
Platelets: 35 10*3/uL — ABNORMAL LOW (ref 150–400)
RBC: 3.08 MIL/uL — ABNORMAL LOW (ref 4.22–5.81)
RDW: 16 % — AB (ref 11.5–15.5)
WBC: 14.6 10*3/uL — AB (ref 4.0–10.5)

## 2013-09-26 LAB — GLUCOSE, CAPILLARY
GLUCOSE-CAPILLARY: 148 mg/dL — AB (ref 70–99)
Glucose-Capillary: 137 mg/dL — ABNORMAL HIGH (ref 70–99)
Glucose-Capillary: 164 mg/dL — ABNORMAL HIGH (ref 70–99)

## 2013-09-26 MED ORDER — CLOPIDOGREL BISULFATE 75 MG PO TABS
75.0000 mg | ORAL_TABLET | Freq: Every day | ORAL | Status: DC
  Administered 2013-09-27 – 2013-10-01 (×4): 75 mg via ORAL
  Filled 2013-09-26 (×7): qty 1

## 2013-09-26 NOTE — Progress Notes (Addendum)
Occupational Therapy Session Note  Patient Details  Name: John Carter MRN: [id number] Date of Birth: 03-21-1930  Today's Date: 09/26/2013 Time: 0930-1030 Time Calculation (min): 60 min  Short Term Goals: Week 2:  OT Short Term Goal 1 (Week 2): Patient will verbalize 3 steps required for caregiver to complete slide board transfer bed <> w/c. OT Short Term Goal 2 (Week 2): Patient will demo ability to complete 25% of self-feeding with mod assist using AE and/or orthotics to stabilize wrist OT Short Term Goal 3 (Week 2): Patient will maintain static sitting balance at edge of bed for 30 seconds with min assist in prep for slide board transfer OT Short Term Goal 4 (Week 2): Patient will demo ability to pull either arm from bedrail when cued while sidelying in prep for assisted bed mobiltiy. OT Short Term Goal 5 (Week 2): Patient will demo ability to lift either leg off bed to assist with lacing pants with extra time and min verbal cues  Skilled Therapeutic Interventions/Progress Updates:  ADL-retraining with focus on improved participation with bed mobility, lower body dressing,  slide board transfer, and performance of AAROM.   Patient received supine in bed, receptive for treatment to include assisted lower body dressing and assisted sliding board transfer.   Patient able to pull left arm out of bed rail this session when cued during bed mobility and lift left leg off bed during assisted lower body dressing.   Patient remains total assist with dressing and transfers but continues to demonstrate willingness to attempt movements when cued.   During side-lying patient performed 10% of left elbow extension AROM with moderate facilitation to support wrist and elbow.   Following total assist transfer to w/c, patient reported interest in contacting dietary service for change in breakfast meal choices.   With setup assist to dial and hold phone, patient accurately registered his request with food  service personnel.    Patient left with w/c reclined to comfort, call light at bottom edge of cervical collar, and spouse present in room to assist, as needed.      Therapy Documentation Precautions:  Precautions Precautions: Fall;Cervical Precaution Comments: Central cord presentation - with sitting pt has some spasms causing quick forward lean so be cautious during sitting balance activities.  Required Braces or Orthoses: Cervical Brace Cervical Brace: Hard collar;At all times Restrictions Weight Bearing Restrictions: No  Pain: No pain   ADL: ADL ADL Comments: see FIM  See FIM for current functional status  Therapy/Group: Individual Therapy  Second session: Time: 1415-1445 Time Calculation (min):  30 min Missed Time: 15 minutes, due to fatigue.  Pain Assessment: No pain  Skilled Therapeutic Interventions: ADL-retraining with focus on improved performance with assisted self-feeding, slide board transfer, static sitting balance, and bed mobility.   With hand guidance, setup assist to don cuff, and manual facilitation to initiate and complete UE movement pattern (shoulder flexion, internal rotation, elbow flexion), patient initiated elbow flexion through approx 10% of AAROM to complete self-feeding of 8 spoons of mashed potatoes.   Patient required total assist transfer from w/c to bed but is consistently able to list and direct in correct sequence 4 of 4 steps required for caregiver to safely complete transfer.   Patient then participated in static sitting task, sitting at edge of bed, unsupported with hands placed on his knees for maximum of 2:00 minutes without evidence of LOB.   Following sitting balance task, patient reported fatigue and need for return to bed, requiring total  assist to recover from sitting at edge of bed to supine and to reposition for comfort.   Patient left supine in bed, supported by pillows under each arm, call light under collar and spouse attending his needs  beside him.    See FIM for current functional status  Therapy/Group: Individual Therapy  Kelie Gainey 09/26/2013, 3:00 PM

## 2013-09-26 NOTE — Plan of Care (Signed)
Problem: SCI BLADDER ELIMINATION Goal: RH STG MANAGE BLADDER WITH EQUIPMENT WITH ASSISTANCE STG Manage Bladder With Equipment With total Assistance  Outcome: Not Progressing Foley catheter is in place for retention.

## 2013-09-26 NOTE — Progress Notes (Signed)
Physical Therapy Session Note  Patient Details  Name: John Carter MRN: [id number] Date of Birth: March 03, 1930  Today's Date: 09/26/2013 Time: 1030-1130 Time Calculation (min): 60 min  Short Term Goals: Week 1:  PT Short Term Goal 1 (Week 1): Pt will transfer bed<> wheelchair total assist +2 (pt = 15%) PT Short Term Goal 1 - Progress (Week 1): Not met PT Short Term Goal 2 (Week 1): Pt will maintain static sitting balance x 1 min with mod assist PT Short Term Goal 2 - Progress (Week 1): Met PT Short Term Goal 3 (Week 1): Pt will direct w/c and sliding board set-up with mod verbal cues.  PT Short Term Goal 3 - Progress (Week 1): Met  Skilled Therapeutic Interventions/Progress Updates:    Session focused on sliding board transfers, sitting balance, and standing tolerance. Sliding board transfer w/c to/from mat performed with +2 total assist (pt= 10%), pt able to self-direct w/c and sliding board placement with min verbal cues. Sitting balance with bil elbows supported static progressing to dynamic focusing on activation of trunk stabilizers to maintain midline. Standing in standing frame x ~4 min with active glute squeeze and active trunk extension x 5-10 sec holds with PT facilitation. bil LE active knee extension then flexion pulling w/c forwards for pre-w/c propulsion, pt able to pull w/c forwards x 4 bouts before fatigue. Pt diaphoretic - BP 142/78 HR 87, RN made aware and reports she will monitor.   Therapy Documentation Precautions:  Precautions Precautions: Fall;Cervical Precaution Comments: Central cord presentation - with sitting pt has some spasms causing quick forward lean so be cautious during sitting balance activities.  Required Braces or Orthoses: Cervical Brace Cervical Brace: Hard collar;At all times Restrictions Weight Bearing Restrictions: No Pain:  no c/o pain  See FIM for current functional status  Therapy/Group: Individual Therapy  [place] Rocker 09/26/2013, 12:29 PM

## 2013-09-26 NOTE — Progress Notes (Signed)
Speech Language Pathology Daily Session Note  Patient Details  Name: John Carter MRN: [id number] Date of Birth: 1930/06/04  Today's Date: 09/26/2013 Time: 8413-2440 Time Calculation (min): 25 min  Short Term Goals: Week 2: SLP Short Term Goal 1 (Week 2): Pt will utilize swallowing compensatory strategies with Mod I to minimize overt s/s of aspiration.  SLP Short Term Goal 2 (Week 2): Pt will recall swallowing compensatory strategies with Mod I.  SLP Short Term Goal 3 (Week 2): Pt will demonstrate efficient mastication of regular textures with minimal overt s/s of aspiration with supervision verbal cues.   Skilled Therapeutic Interventions: Skilled treatment session focused on dysphagia goals.  SLP facilitated session by providing trials of regular textures. Pt demonstrated efficient but prolonged mastication of regular textures with overt coughing. Pt cleared minimal oral stasis with a liquid wash. Recommend to continue current diet. SLP also provided continued education to patient and his wife in regards to appropriate textures and meal options. Continue with current plan of care.    FIM:  Comprehension Comprehension Mode: Auditory Comprehension: 5-Understands complex 90% of the time/Cues < 10% of the time Expression Expression Mode: Verbal Expression: 5-Expresses basic needs/ideas: With no assist Social Interaction Social Interaction: 5-Interacts appropriately 90% of the time - Needs monitoring or encouragement for participation or interaction. Problem Solving Problem Solving: 5-Solves complex 90% of the time/cues < 10% of the time Memory Memory: 5-Recognizes or recalls 90% of the time/requires cueing < 10% of the time FIM - Eating Eating Activity: 1: Helper feeds patient  Pain Pain Assessment Pain Assessment: No/denies pain  Therapy/Group: Individual Therapy  John Carter, [place] 09/26/2013, 4:36 PM

## 2013-09-26 NOTE — Progress Notes (Signed)
Spoke with Dr. Vertell Limber in regards to resuming Plavix for history of CAD with stenting. Will resume Plavix today

## 2013-09-26 NOTE — Progress Notes (Signed)
Subjective/Complaints: No new complaints. Pain controlled. Ready to get started with therapies today.  Review of Systems - Negative except weakness in all 4 limbs  Objective: Vital Signs: Blood pressure 153/76, pulse 72, temperature 97.9 F (36.6 C), temperature source Oral, resp. rate 17, height _0  (1.727 m), weight 87.317 kg (192 lb 8 oz), SpO2 96.00%. No results found. Results for orders placed during the hospital encounter of 09/13/13 (from the past 72 hour(s))  GLUCOSE, CAPILLARY     Status: Abnormal   Collection Time    09/23/13 11:29 AM      Result Value Ref Range   Glucose-Capillary 208 (*) 70 - 99 mg/dL   Comment 1 Notify RN    GLUCOSE, CAPILLARY     Status: Abnormal   Collection Time    09/23/13  4:40 PM      Result Value Ref Range   Glucose-Capillary 184 (*) 70 - 99 mg/dL   Comment 1 Notify RN    GLUCOSE, CAPILLARY     Status: Abnormal   Collection Time    09/23/13  7:51 PM      Result Value Ref Range   Glucose-Capillary 172 (*) 70 - 99 mg/dL  CBC     Status: Abnormal   Collection Time    09/24/13  6:20 AM      Result Value Ref Range   WBC 18.9 (*) 4.0 - 10.5 K/uL   RBC 3.12 (*) 4.22 - 5.81 MIL/uL   Hemoglobin 10.3 (*) 13.0 - 17.0 g/dL   HCT 30.2 (*) 39.0 - 52.0 %   MCV 96.8  78.0 - 100.0 fL   MCH 33.0  26.0 - 34.0 pg   MCHC 34.1  30.0 - 36.0 g/dL   RDW 15.3  11.5 - 15.5 %   Platelets 41 (*) 150 - 400 K/uL   Comment: CONSISTENT WITH PREVIOUS RESULT  BASIC METABOLIC PANEL     Status: Abnormal   Collection Time    09/24/13  6:20 AM      Result Value Ref Range   Sodium 136 (*) 137 - 147 mEq/L   Potassium 4.6  3.7 - 5.3 mEq/L   Chloride 104  96 - 112 mEq/L   CO2 22  19 - 32 mEq/L   Glucose, Bld 117 (*) 70 - 99 mg/dL   BUN 33 (*) 6 - 23 mg/dL   Creatinine, Ser 0.67  0.50 - 1.35 mg/dL   Calcium 7.7 (*) 8.4 - 10.5 mg/dL   GFR calc non Af Amer 88 (*) >90 mL/min   GFR calc Af Amer >90  >90 mL/min   Comment: (NOTE)     The eGFR has been calculated using  the CKD EPI equation.     This calculation has not been validated in all clinical situations.     eGFR's persistently <90 mL/min signify possible Chronic Kidney     Disease.  GLUCOSE, CAPILLARY     Status: Abnormal   Collection Time    09/24/13  7:25 AM      Result Value Ref Range   Glucose-Capillary 109 (*) 70 - 99 mg/dL   Comment 1 Notify RN    GLUCOSE, CAPILLARY     Status: Abnormal   Collection Time    09/24/13 11:48 AM      Result Value Ref Range   Glucose-Capillary 150 (*) 70 - 99 mg/dL   Comment 1 Notify RN    GLUCOSE, CAPILLARY     Status: Abnormal   Collection  Time    09/24/13  4:26 PM      Result Value Ref Range   Glucose-Capillary 163 (*) 70 - 99 mg/dL   Comment 1 Notify RN    GLUCOSE, CAPILLARY     Status: Abnormal   Collection Time    09/24/13  9:16 PM      Result Value Ref Range   Glucose-Capillary 191 (*) 70 - 99 mg/dL   Comment 1 Notify RN    CBC     Status: Abnormal   Collection Time    09/25/13  5:51 AM      Result Value Ref Range   WBC 16.2 (*) 4.0 - 10.5 K/uL   RBC 3.03 (*) 4.22 - 5.81 MIL/uL   Hemoglobin 9.9 (*) 13.0 - 17.0 g/dL   HCT 29.4 (*) 39.0 - 52.0 %   MCV 97.0  78.0 - 100.0 fL   MCH 32.7  26.0 - 34.0 pg   MCHC 33.7  30.0 - 36.0 g/dL   RDW 15.7 (*) 11.5 - 15.5 %   Platelets 34 (*) 150 - 400 K/uL   Comment: CONSISTENT WITH PREVIOUS RESULT  GLUCOSE, CAPILLARY     Status: Abnormal   Collection Time    09/25/13  7:28 AM      Result Value Ref Range   Glucose-Capillary 161 (*) 70 - 99 mg/dL   Comment 1 Notify RN    GLUCOSE, CAPILLARY     Status: Abnormal   Collection Time    09/25/13 11:22 AM      Result Value Ref Range   Glucose-Capillary 153 (*) 70 - 99 mg/dL   Comment 1 Notify RN    GLUCOSE, CAPILLARY     Status: Abnormal   Collection Time    09/25/13  4:43 PM      Result Value Ref Range   Glucose-Capillary 233 (*) 70 - 99 mg/dL   Comment 1 Notify RN    GLUCOSE, CAPILLARY     Status: Abnormal   Collection Time    09/25/13  8:59 PM       Result Value Ref Range   Glucose-Capillary 207 (*) 70 - 99 mg/dL  CBC     Status: Abnormal   Collection Time    09/26/13  6:45 AM      Result Value Ref Range   WBC 14.6 (*) 4.0 - 10.5 K/uL   RBC 3.08 (*) 4.22 - 5.81 MIL/uL   Hemoglobin 10.1 (*) 13.0 - 17.0 g/dL   HCT 30.1 (*) 39.0 - 52.0 %   MCV 97.7  78.0 - 100.0 fL   MCH 32.8  26.0 - 34.0 pg   MCHC 33.6  30.0 - 36.0 g/dL   RDW 16.0 (*) 11.5 - 15.5 %   Platelets 35 (*) 150 - 400 K/uL   Comment: CONSISTENT WITH PREVIOUS RESULT  GLUCOSE, CAPILLARY     Status: Abnormal   Collection Time    09/26/13  7:33 AM      Result Value Ref Range   Glucose-Capillary 148 (*) 70 - 99 mg/dL   Comment 1 Notify RN        Constitutional: He is oriented to person, place, and time.  HENT: tongue midline, Multiple bruises to the face and forehead  Eyes: EOM are normal.  Neck:  Cervical collar in place  Cardiovascular: Normal rate and regular rhythm.  Respiratory: Effort normal and breath sounds normal. No respiratory distress.  GI: Soft. Bowel sounds are normal. He exhibits no distension.  Neurological: He is alert and oriented to person, place, and time.  Skin: bruising over face Left scalp staples intact motor strength is tr/5 in the right deltoid, 0 bicep, 1 tricep, 0 grip, WE trace  Motor strength is trace in the finger flexors on the left otherwise 0 in the deltoid bicep triceps  Right lower extremity 1+  minus hip knee extensor synergy 0/5 at the ankle dorsiflexor plantar flexors  Left lower extremity 2 minus hip flexor 3 minus knee extensor 1+ to 2 minus plantar flexor  Sensory intact to light touch in the right upper and left upper extremity (1/2), sensation slightly better in the LE's but inconsistent  No resting tone.   Absent to light touch right L5 and right S1 dermatomes  Intact to light touch left lower extremity    Assessment/Plan: 1. Functional deficits secondary to spastic tetraplegia central cord which require 3+ hours  per day of interdisciplinary therapy in a comprehensive inpatient rehab setting. Physiatrist is providing close team supervision and 24 hour management of active medical problems listed below. Physiatrist and rehab team continue to assess barriers to discharge/monitor patient progress toward functional and medical goals. FIM: FIM - Bathing Bathing Steps Patient Completed:  (receives night baths with nursing) Bathing: 1: Total-Patient completes 0-2 of 10 parts or less than 25%  FIM - Upper Body Dressing/Undressing Upper body dressing/undressing steps patient completed:  (patient elected not to change into street clothing today) Upper body dressing/undressing: 0: Wears gown/pajamas-no public clothing FIM - Lower Body Dressing/Undressing Lower body dressing/undressing: 1: Total-Patient completed less than 25% of tasks  FIM - Toileting Toileting: 1: Total-Patient completed zero steps, helper did all 3  FIM - Air cabin crew Transfers: 0-Activity did not occur  FIM - Control and instrumentation engineer Devices: Arm rests;Sliding board;HOB elevated Bed/Chair Transfer: 1: Supine > Sit: Total A (helper does all/Pt. < 25%);1: Bed > Chair or W/C: Total A (helper does all/Pt. < 25%)  FIM - Locomotion: Wheelchair Locomotion: Wheelchair: 1: Total Assistance/staff pushes wheelchair (Pt<25%) FIM - Locomotion: Ambulation Locomotion: Ambulation:  (unsafe to attempt)  Comprehension Comprehension Mode: Auditory Comprehension: 5-Understands complex 90% of the time/Cues < 10% of the time  Expression Expression Mode: Verbal Expression: 5-Expresses basic needs/ideas: With no assist  Social Interaction Social Interaction: 5-Interacts appropriately 90% of the time - Needs monitoring or encouragement for participation or interaction.  Problem Solving Problem Solving: 2-Solves basic 25 - 49% of the time - needs direction more than half the time to initiate, plan or complete simple  activities  Memory Memory: 2-Recognizes or recalls 25 - 49% of the time/requires cueing 51 - 75% of the time  Medical Problem List and Plan:  1. 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 Status post C2-C4 fusion  2. DVT Prophylaxis/Anticoagulation: SCDs.  Dopplers negative, no enoxaparin low plt 3. Pain Management/chronic back pain , Hydrocodone and Robaxin as needed. Monitor with increased mobility  4. Neuropsych: This patient is capable of making decisions on his own behalf.  5. History of CAD with angioplasty. No chest pain or shortness of breath  6. History of CVA. Plavix currently on hold discuss resuming neurosurgery soon 7. Diabetes mellitus with peripheral neuropathy. Monitor blood sugars closely while on Decadron taper. Lantus insulin  -titrated to 30u bid   -showing improvement--observe today 8. Hypertension. Lasix 20 mg daily (held), Avapro 75 mg daily, Toprol 50 mg daily, Norvasc 5 mg daily. Monitor with increased mobility  9. Hyperlipidemia. Zocor  10. History of  multiple myeloma. I spoke to Dr. Alvy Bimler ---no plans on resuming revlimid  -feels that changes in counts are reactive to stress/illness/steroids as opposed to MM  -morganella UTI---sens to cipro  -WBC's down to 14k.. Platelets at 35k  -continue daily CBC and observation clinically  11. Renal:   -Lasix remains on hold  -recheck labs tomorrow  -encourage pO 12.  Insomnia: trial restoril  LOS (Days) 13 A FACE TO FACE EVALUATION WAS PERFORMED  Mahad Newstrom T 09/26/2013, 8:29 AM

## 2013-09-26 NOTE — Plan of Care (Signed)
Problem: SCI BOWEL ELIMINATION Goal: RH STG MANAGE BOWEL WITH ASSISTANCE STG Manage Bowel with total Assistance.  Outcome: Progressing Daily supp. In pm w/ daily stool w/ bowel program

## 2013-09-27 ENCOUNTER — Ambulatory Visit (HOSPITAL_COMMUNITY): Payer: Medicare Other | Admitting: *Deleted

## 2013-09-27 ENCOUNTER — Inpatient Hospital Stay (HOSPITAL_COMMUNITY): Payer: Medicare Other | Admitting: Physical Therapy

## 2013-09-27 ENCOUNTER — Inpatient Hospital Stay (HOSPITAL_COMMUNITY): Payer: Medicare Other

## 2013-09-27 ENCOUNTER — Encounter (HOSPITAL_COMMUNITY): Payer: Medicare Other | Admitting: *Deleted

## 2013-09-27 LAB — CBC
HEMATOCRIT: 31.6 % — AB (ref 39.0–52.0)
Hemoglobin: 10.6 g/dL — ABNORMAL LOW (ref 13.0–17.0)
MCH: 32.7 pg (ref 26.0–34.0)
MCHC: 33.5 g/dL (ref 30.0–36.0)
MCV: 97.5 fL (ref 78.0–100.0)
PLATELETS: 34 10*3/uL — AB (ref 150–400)
RBC: 3.24 MIL/uL — ABNORMAL LOW (ref 4.22–5.81)
RDW: 16.1 % — AB (ref 11.5–15.5)
WBC: 14.1 10*3/uL — AB (ref 4.0–10.5)

## 2013-09-27 LAB — GLUCOSE, CAPILLARY
Glucose-Capillary: 134 mg/dL — ABNORMAL HIGH (ref 70–99)
Glucose-Capillary: 114 mg/dL — ABNORMAL HIGH (ref 70–99)
Glucose-Capillary: 213 mg/dL — ABNORMAL HIGH (ref 70–99)
Glucose-Capillary: 194 mg/dL — ABNORMAL HIGH (ref 70–99)

## 2013-09-27 NOTE — Progress Notes (Signed)
Subjective/Complaints: No new issues. Denies pain  Review of Systems - Negative except weakness in all 4 limbs  Objective: Vital Signs: Blood pressure 120/70, pulse 89, temperature 97.3 F (36.3 C), temperature source Oral, resp. rate 18, height $RemoveBe'5\' 8"'DuXQSmxqc$  (1.727 m), weight 87.317 kg (192 lb 8 oz), SpO2 95.00%. No results found. Results for orders placed during the hospital encounter of 09/13/13 (from the past 72 hour(s))  GLUCOSE, CAPILLARY     Status: Abnormal   Collection Time    09/24/13 11:48 AM      Result Value Ref Range   Glucose-Capillary 150 (*) 70 - 99 mg/dL   Comment 1 Notify RN    GLUCOSE, CAPILLARY     Status: Abnormal   Collection Time    09/24/13  4:26 PM      Result Value Ref Range   Glucose-Capillary 163 (*) 70 - 99 mg/dL   Comment 1 Notify RN    GLUCOSE, CAPILLARY     Status: Abnormal   Collection Time    09/24/13  9:16 PM      Result Value Ref Range   Glucose-Capillary 191 (*) 70 - 99 mg/dL   Comment 1 Notify RN    CBC     Status: Abnormal   Collection Time    09/25/13  5:51 AM      Result Value Ref Range   WBC 16.2 (*) 4.0 - 10.5 K/uL   RBC 3.03 (*) 4.22 - 5.81 MIL/uL   Hemoglobin 9.9 (*) 13.0 - 17.0 g/dL   HCT 29.4 (*) 39.0 - 52.0 %   MCV 97.0  78.0 - 100.0 fL   MCH 32.7  26.0 - 34.0 pg   MCHC 33.7  30.0 - 36.0 g/dL   RDW 15.7 (*) 11.5 - 15.5 %   Platelets 34 (*) 150 - 400 K/uL   Comment: CONSISTENT WITH PREVIOUS RESULT  GLUCOSE, CAPILLARY     Status: Abnormal   Collection Time    09/25/13  7:28 AM      Result Value Ref Range   Glucose-Capillary 161 (*) 70 - 99 mg/dL   Comment 1 Notify RN    GLUCOSE, CAPILLARY     Status: Abnormal   Collection Time    09/25/13 11:22 AM      Result Value Ref Range   Glucose-Capillary 153 (*) 70 - 99 mg/dL   Comment 1 Notify RN    GLUCOSE, CAPILLARY     Status: Abnormal   Collection Time    09/25/13  4:43 PM      Result Value Ref Range   Glucose-Capillary 233 (*) 70 - 99 mg/dL   Comment 1 Notify RN     GLUCOSE, CAPILLARY     Status: Abnormal   Collection Time    09/25/13  8:59 PM      Result Value Ref Range   Glucose-Capillary 207 (*) 70 - 99 mg/dL  CBC     Status: Abnormal   Collection Time    09/26/13  6:45 AM      Result Value Ref Range   WBC 14.6 (*) 4.0 - 10.5 K/uL   RBC 3.08 (*) 4.22 - 5.81 MIL/uL   Hemoglobin 10.1 (*) 13.0 - 17.0 g/dL   HCT 30.1 (*) 39.0 - 52.0 %   MCV 97.7  78.0 - 100.0 fL   MCH 32.8  26.0 - 34.0 pg   MCHC 33.6  30.0 - 36.0 g/dL   RDW 16.0 (*) 11.5 - 15.5 %  Platelets 35 (*) 150 - 400 K/uL   Comment: CONSISTENT WITH PREVIOUS RESULT  GLUCOSE, CAPILLARY     Status: Abnormal   Collection Time    09/26/13  7:33 AM      Result Value Ref Range   Glucose-Capillary 148 (*) 70 - 99 mg/dL   Comment 1 Notify RN    GLUCOSE, CAPILLARY     Status: Abnormal   Collection Time    09/26/13 11:35 AM      Result Value Ref Range   Glucose-Capillary 137 (*) 70 - 99 mg/dL   Comment 1 Notify RN    GLUCOSE, CAPILLARY     Status: Abnormal   Collection Time    09/26/13  8:39 PM      Result Value Ref Range   Glucose-Capillary 164 (*) 70 - 99 mg/dL  CBC     Status: Abnormal   Collection Time    09/27/13  5:15 AM      Result Value Ref Range   WBC 14.1 (*) 4.0 - 10.5 K/uL   RBC 3.24 (*) 4.22 - 5.81 MIL/uL   Hemoglobin 10.6 (*) 13.0 - 17.0 g/dL   HCT 75.4 (*) 36.0 - 67.7 %   MCV 97.5  78.0 - 100.0 fL   MCH 32.7  26.0 - 34.0 pg   MCHC 33.5  30.0 - 36.0 g/dL   RDW 03.4 (*) 03.5 - 24.8 %   Platelets 34 (*) 150 - 400 K/uL   Comment: CONSISTENT WITH PREVIOUS RESULT  GLUCOSE, CAPILLARY     Status: Abnormal   Collection Time    09/27/13  7:38 AM      Result Value Ref Range   Glucose-Capillary 134 (*) 70 - 99 mg/dL      Constitutional: He is oriented to person, place, and time.  HENT: tongue midline, Multiple bruises to the face and forehead  Eyes: EOM are normal.  Neck:  Cervical collar in place  Cardiovascular: Normal rate and regular rhythm.  Respiratory:  Effort normal and breath sounds normal. No respiratory distress.  GI: Soft. Bowel sounds are normal. He exhibits no distension.  Neurological: He is alert and oriented to person, place, and time.  Skin: bruising over face Left scalp staples intact motor strength is tr/5 in the right deltoid, 0 bicep, 1 tricep, 0 grip, WE trace  Motor strength is trace in the finger flexors on the left otherwise 0 in the deltoid bicep triceps  Right lower extremity 1+  minus hip knee extensor synergy 0/5 at the ankle dorsiflexor plantar flexors  Left lower extremity 2 minus hip flexor 3 minus knee extensor 1+ to 2 minus plantar flexor  Sensory intact to light touch in the right upper and left upper extremity (1/2), sensation slightly better in the LE's but inconsistent  No resting tone.   Absent to light touch right L5 and right S1 dermatomes  Intact to light touch left lower extremity    Assessment/Plan: 1. Functional deficits secondary to spastic tetraplegia central cord which require 3+ hours per day of interdisciplinary therapy in a comprehensive inpatient rehab setting. Physiatrist is providing close team supervision and 24 hour management of active medical problems listed below. Physiatrist and rehab team continue to assess barriers to discharge/monitor patient progress toward functional and medical goals. FIM: FIM - Bathing Bathing Steps Patient Completed:  (receives night baths with nursing) Bathing: 1: Total-Patient completes 0-2 of 10 parts or less than 25%  FIM - Upper Body Dressing/Undressing Upper body dressing/undressing steps  patient completed:  (patient elected not to change into street clothing today) Upper body dressing/undressing: 0: Wears gown/pajamas-no public clothing FIM - Lower Body Dressing/Undressing Lower body dressing/undressing: 1: Total-Patient completed less than 25% of tasks  FIM - Toileting Toileting: 1: Total-Patient completed zero steps, helper did all 3  FIM - Engineer, petroleum Transfers: 0-Activity did not occur  FIM - Control and instrumentation engineer Devices: Sliding board Bed/Chair Transfer: 1: Two helpers  FIM - Locomotion: Wheelchair Locomotion: Wheelchair: 1: Travels less than 50 ft with maximal assistance (Pt: 25 - 49%) FIM - Locomotion: Ambulation Locomotion: Ambulation:  (unsafe to attempt)  Comprehension Comprehension Mode: Auditory Comprehension: 5-Understands complex 90% of the time/Cues < 10% of the time  Expression Expression Mode: Verbal Expression: 5-Expresses basic needs/ideas: With no assist  Social Interaction Social Interaction: 5-Interacts appropriately 90% of the time - Needs monitoring or encouragement for participation or interaction.  Problem Solving Problem Solving: 5-Solves complex 90% of the time/cues < 10% of the time  Memory Memory: 5-Recognizes or recalls 90% of the time/requires cueing < 10% of the time  Medical Problem List and Plan:  1. 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 Status post C2-C4 fusion  2. DVT Prophylaxis/Anticoagulation: SCDs.  Dopplers negative, no enoxaparin low plt 3. Pain Management/chronic back pain , Hydrocodone and Robaxin as needed. Monitor with increased mobility  4. Neuropsych: This patient is capable of making decisions on his own behalf.  5. History of CAD with angioplasty. No chest pain or shortness of breath  6. History of CVA. Plavix currently on hold discuss resuming neurosurgery soon 7. Diabetes mellitus with peripheral neuropathy. Monitor blood sugars closely while on Decadron taper. Lantus insulin  -titrated to 30u bid   -showing improvement--observe today 8. Hypertension. Lasix 20 mg daily (held), Avapro 75 mg daily, Toprol 50 mg daily, Norvasc 5 mg daily. Monitor with increased mobility  9. Hyperlipidemia. Zocor  10. History of multiple myeloma. I spoke to Dr. Alvy Bimler ---no plans on resuming revlimid  -feels that changes in counts  are reactive to stress/illness/steroids as opposed to MM  -morganella UTI---sens to cipro  -WBC's down to 14k.. Platelets in 30k range  -continue daily CBC and observation clinically  11. Renal:   -Lasix remains on hold  -recheck labs tomorrow  -encourage pO 12.  Insomnia: trial restoril  LOS (Days) 14 A FACE TO FACE EVALUATION WAS PERFORMED  SWARTZ,ZACHARY T 09/27/2013, 8:58 AM

## 2013-09-27 NOTE — Progress Notes (Signed)
Social Work Patient ID: John Carter, male   DOB: [birth year], 80 y.o.   MRN: [id number]  CSW met with pt and his wife to update them on team conference discussion on 09-26-13. Explained that pt is getting to the point where he only needs one caregiver for treatment and that once a bowel/bladder plan is established, pt could tx to SNF.  Pt's wife seemed surprised by these goals and felt that CSW/team was "kicking" pt out.  She wanted to know when that would occur.  CSW explained that pt would need to reach a few more goals as listed above and she would need to choose a facility.  Pt has been in a SNF before with a bad experience, so wife became tearful thinking about the change from CIR.  She has been pleased with his care here.  Pt's original d/c date was set for 10-12-13, but CSW explained that if plan is for SNF, that he may be tx'd before that date.  Then, pt's wife stated maybe she should just get DME and take care of him at home.  Tracey, Rehab tech, was present for that part of the discussion and she has a good relationship with pt's wife and told her that pt would be too much care for her to do by herself and what good would she be for pt if she became injured or worn out.  Wife seemed to understand this and again became tearful.  CSW will provide them with a SNF list and pt's wife plans to tour several facilities and CSW will f/u with her to begin SNF tx process.  Support given to wife.

## 2013-09-27 NOTE — Progress Notes (Signed)
Physical Therapy Session Note  Patient Details  Name: John Carter MRN: [id number] Date of Birth: 02/22/1930  Today's Date: 09/27/2013 Time: 1400-1435 Time Calculation (min): 35 min  Skilled Therapeutic Interventions/Progress Updates:    Session focused on propulsion of wheelchair with LEs for functional mobility and LE strengthening. Forwards direction- pt able to propel 7' with frequent rest breaks, better able propel with Lt LE only PT supported bil feet on floor for friction. Propulsion backwards x 25' assist needed to bring LEs back however able to demonstrate really good LE extension and pushes off without assist.   Therapy Documentation Precautions:  Precautions Precautions: Fall;Cervical Precaution Comments: Central cord presentation - with sitting pt has some spasms causing quick forward lean so be cautious during sitting balance activities.  Required Braces or Orthoses: Cervical Brace Cervical Brace: Hard collar;At all times Restrictions Weight Bearing Restrictions: No Pain:  Rt clavicle pain, did not need medication  See FIM for current functional status  Therapy/Group: Individual Therapy  Lahoma Rocker 09/27/2013, 3:59 PM

## 2013-09-27 NOTE — Progress Notes (Addendum)
Occupational Therapy Session Note  Patient Details  Name: John Carter MRN: [id number] Date of Birth: June 08, 1930  Today's Date: 09/27/2013 Time: 1000-1059 Time Calculation (min): 59 min  Short Term Goals: Week 2:  OT Short Term Goal 1 (Week 2): Patient will verbalize 3 steps required for caregiver to complete slide board transfer bed <> w/c. OT Short Term Goal 2 (Week 2): Patient will demo ability to complete 25% of self-feeding with mod assist using AE and/or orthotics to stabilize wrist OT Short Term Goal 3 (Week 2): Patient will maintain static sitting balance at edge of bed for 30 seconds with min assist in prep for slide board transfer OT Short Term Goal 4 (Week 2): Patient will demo ability to pull either arm from bedrail when cued while sidelying in prep for assisted bed mobiltiy. OT Short Term Goal 5 (Week 2): Patient will demo ability to lift either leg off bed to assist with lacing pants with extra time and min verbal cues  Skilled Therapeutic Interventions/Progress Updates: ADL-retraining with focus on improved participation with assisted lower body dressing, bed mobility, slide board transfers, static sitting balance, NMR of bil UE, and general endurance.   Patient demo'd partial left leg lift during supine assisted dressing but was unable to lift or move R-LE during this session.   Patient demonstrated no noticeable improvement with functional use of either UE during AAROM, although his attention and concentration was sustained during activity.   Patient continues to require total assist with slide board transfers to include lateral leans but remains competent with providing explicit directions during transfer process.       Therapy Documentation Precautions:  Precautions Precautions: Fall;Cervical Precaution Comments: Central cord presentation - with sitting pt has some spasms causing quick forward lean so be cautious during sitting balance activities.  Required Braces or  Orthoses: Cervical Brace Cervical Brace: Hard collar;At all times Restrictions Weight Bearing Restrictions: No  Pain: Pain Assessment Pain Assessment: 0-10 Pain Score: 3  Pain Type: Chronic pain Pain Location: Shoulder Pain Orientation: Right;Proximal Pain Descriptors / Indicators: Discomfort  ADL: ADL ADL Comments: see FIM  See FIM for current functional status  Therapy/Group: Individual Therapy  Second session: Time: 4431-5400 Time Calculation (min): 45 min  Pain Assessment: No pain  Skilled Therapeutic Interventions: ADL-retraining with focus on improved performance with assisted self-feeding, adherence to cervical precaution during assisted supine grooming (shaving), and slide-board transfer.   Patient received in bed, his wife present and feeding patient w/o attempting use of orthotics.   OT re-educated patient's spouse on need for incorporation of functional activity (self-feeding, partial meal) to promote improved UE function.   OT then provided max assist with self-feeding using splint/cuff to consume 1 cup of ice cream.   Patient able to initiate movement (initial 10% of right elbow flexion) with arm supported at wrist and elbow.   Following self-feeding patient requested assist with shaving in prep for a visit from his brother (older) who was in route to hospital from [place], currently within minutes of [facility].   OT re-educated patient on adherence to precaution to maintain position of head/neck while assisted with ADL, in supine position, and provided total assist for shaving.   After assisted grooming patient's brother arrived and patient was assisted from supine to sitting at edge of bed and with transfer to w/c using slide board.   Patient's brother provided setup assist to stabilize patient's w/c during total assist slide board transfer.   Patient left in w/c, arms supported,  with family present.  See FIM for current functional status  Therapy/Group: Individual  Therapy  Candee Hoon 09/27/2013, 11:00 AM

## 2013-09-27 NOTE — Progress Notes (Signed)
Physical Therapy Session Note  Patient Details  Name: John Carter MRN: [id number] Date of Birth: 02-25-1930  Today's Date: 09/27/2013 Time: 1100-1200 Time Calculation (min): 60 min  Skilled Therapeutic Interventions/Progress Updates:    Sliding board transfer w/c to/from mat with +2 total assist pt directing care with min/mod verbal cues for w/c and sliding board set up. Sit <> supine on mat and bed +2 assist for trunk and LE support. Supine therex: bil heel slides (with heel protection) 2 x 10 reps bil LEs pt able to take minimal resistance with Lt LE, requires AAROM Rt LE and more frequent rest breaks. Rt LE therex on powder board hip flexion/extension with PT facilitation of appropriate musculature. Pt found to have bowel incontinence and returned to room. +2 for rolling (multiple x bil directions) to clean, during rolling address LE positioning and core contraction to assist caregivers.   Therapy Documentation Precautions:  Precautions Precautions: Fall;Cervical Precaution Comments: Central cord presentation - with sitting pt has some spasms causing quick forward lean so be cautious during sitting balance activities.  Required Braces or Orthoses: Cervical Brace Cervical Brace: Hard collar;At all times Restrictions Weight Bearing Restrictions: No Pain: Pain Assessment Pain Assessment: 0-10 Pain Score: 3  Pain Type: Chronic pain Pain Location: Shoulder Pain Orientation: Right;Proximal Pain Descriptors / Indicators: Discomfort  See FIM for current functional status  Therapy/Group: Co-Treatment with rec therapy  Lahoma Rocker 09/27/2013, 12:10 PM

## 2013-09-28 ENCOUNTER — Inpatient Hospital Stay (HOSPITAL_COMMUNITY): Payer: Medicare Other | Admitting: Speech Pathology

## 2013-09-28 ENCOUNTER — Inpatient Hospital Stay (HOSPITAL_COMMUNITY): Payer: Medicare Other | Admitting: Physical Therapy

## 2013-09-28 ENCOUNTER — Inpatient Hospital Stay (HOSPITAL_COMMUNITY): Payer: Medicare Other

## 2013-09-28 ENCOUNTER — Encounter (HOSPITAL_COMMUNITY): Payer: Medicare Other | Admitting: *Deleted

## 2013-09-28 DIAGNOSIS — K592 Neurogenic bowel, not elsewhere classified: Secondary | ICD-10-CM

## 2013-09-28 DIAGNOSIS — W19XXXA Unspecified fall, initial encounter: Secondary | ICD-10-CM

## 2013-09-28 DIAGNOSIS — N319 Neuromuscular dysfunction of bladder, unspecified: Secondary | ICD-10-CM

## 2013-09-28 DIAGNOSIS — S022XXA Fracture of nasal bones, initial encounter for closed fracture: Secondary | ICD-10-CM

## 2013-09-28 DIAGNOSIS — S14121A Central cord syndrome at C1 level of cervical spinal cord, initial encounter: Secondary | ICD-10-CM

## 2013-09-28 LAB — BASIC METABOLIC PANEL
BUN: 28 mg/dL — AB (ref 6–23)
CALCIUM: 8.4 mg/dL (ref 8.4–10.5)
CO2: 23 meq/L (ref 19–32)
Chloride: 99 mEq/L (ref 96–112)
Creatinine, Ser: 0.58 mg/dL (ref 0.50–1.35)
GFR calc Af Amer: >90 mL/min (ref >90)
GLUCOSE: 160 mg/dL — AB (ref 70–99)
Potassium: 4.5 mEq/L (ref 3.7–5.3)
Sodium: 136 mEq/L — ABNORMAL LOW (ref 137–147)

## 2013-09-28 LAB — CBC
HEMATOCRIT: 32.3 % — AB (ref 39.0–52.0)
HEMOGLOBIN: 10.7 g/dL — AB (ref 13.0–17.0)
MCH: 32.3 pg (ref 26.0–34.0)
MCHC: 33.1 g/dL (ref 30.0–36.0)
MCV: 97.6 fL (ref 78.0–100.0)
Platelets: 32 10*3/uL — ABNORMAL LOW (ref 150–400)
RBC: 3.31 MIL/uL — ABNORMAL LOW (ref 4.22–5.81)
RDW: 16.2 % — AB (ref 11.5–15.5)
WBC: 13.2 10*3/uL — ABNORMAL HIGH (ref 4.0–10.5)

## 2013-09-28 LAB — GLUCOSE, CAPILLARY
Glucose-Capillary: 155 mg/dL — ABNORMAL HIGH (ref 70–99)
Glucose-Capillary: 152 mg/dL — ABNORMAL HIGH (ref 70–99)
Glucose-Capillary: 202 mg/dL — ABNORMAL HIGH (ref 70–99)
Glucose-Capillary: 180 mg/dL — ABNORMAL HIGH (ref 70–99)

## 2013-09-28 MED ORDER — FUROSEMIDE 20 MG PO TABS
20.0000 mg | ORAL_TABLET | Freq: Every day | ORAL | Status: DC
  Administered 2013-09-28 – 2013-10-10 (×13): 20 mg via ORAL
  Filled 2013-09-28 (×14): qty 1

## 2013-09-28 NOTE — Progress Notes (Signed)
Subjective/Complaints: Denies any new problems. Feeling well. Asked questions about his prognosis again. Review of Systems - Negative except weakness in all 4 limbs  Objective: Vital Signs: Blood pressure 144/84, pulse 79, temperature 98 F (36.7 C), temperature source Oral, resp. rate 18, height $RemoveBe'5\' 8"'RuyjWKWTe$  (1.727 m), weight 87.317 kg (192 lb 8 oz), SpO2 95.00%. No results found. Results for orders placed during the hospital encounter of 09/13/13 (from the past 72 hour(s))  GLUCOSE, CAPILLARY     Status: Abnormal   Collection Time    09/25/13 11:22 AM      Result Value Ref Range   Glucose-Capillary 153 (*) 70 - 99 mg/dL   Comment 1 Notify RN    GLUCOSE, CAPILLARY     Status: Abnormal   Collection Time    09/25/13  4:43 PM      Result Value Ref Range   Glucose-Capillary 233 (*) 70 - 99 mg/dL   Comment 1 Notify RN    GLUCOSE, CAPILLARY     Status: Abnormal   Collection Time    09/25/13  8:59 PM      Result Value Ref Range   Glucose-Capillary 207 (*) 70 - 99 mg/dL  CBC     Status: Abnormal   Collection Time    09/26/13  6:45 AM      Result Value Ref Range   WBC 14.6 (*) 4.0 - 10.5 K/uL   RBC 3.08 (*) 4.22 - 5.81 MIL/uL   Hemoglobin 10.1 (*) 13.0 - 17.0 g/dL   HCT 30.1 (*) 39.0 - 52.0 %   MCV 97.7  78.0 - 100.0 fL   MCH 32.8  26.0 - 34.0 pg   MCHC 33.6  30.0 - 36.0 g/dL   RDW 16.0 (*) 11.5 - 15.5 %   Platelets 35 (*) 150 - 400 K/uL   Comment: CONSISTENT WITH PREVIOUS RESULT  GLUCOSE, CAPILLARY     Status: Abnormal   Collection Time    09/26/13  7:33 AM      Result Value Ref Range   Glucose-Capillary 148 (*) 70 - 99 mg/dL   Comment 1 Notify RN    GLUCOSE, CAPILLARY     Status: Abnormal   Collection Time    09/26/13 11:35 AM      Result Value Ref Range   Glucose-Capillary 137 (*) 70 - 99 mg/dL   Comment 1 Notify RN    GLUCOSE, CAPILLARY     Status: Abnormal   Collection Time    09/26/13  8:39 PM      Result Value Ref Range   Glucose-Capillary 164 (*) 70 - 99 mg/dL  CBC      Status: Abnormal   Collection Time    09/27/13  5:15 AM      Result Value Ref Range   WBC 14.1 (*) 4.0 - 10.5 K/uL   RBC 3.24 (*) 4.22 - 5.81 MIL/uL   Hemoglobin 10.6 (*) 13.0 - 17.0 g/dL   HCT 31.6 (*) 39.0 - 52.0 %   MCV 97.5  78.0 - 100.0 fL   MCH 32.7  26.0 - 34.0 pg   MCHC 33.5  30.0 - 36.0 g/dL   RDW 16.1 (*) 11.5 - 15.5 %   Platelets 34 (*) 150 - 400 K/uL   Comment: CONSISTENT WITH PREVIOUS RESULT  GLUCOSE, CAPILLARY     Status: Abnormal   Collection Time    09/27/13  7:38 AM      Result Value Ref Range   Glucose-Capillary 134 (*)  70 - 99 mg/dL  GLUCOSE, CAPILLARY     Status: Abnormal   Collection Time    09/27/13 11:57 AM      Result Value Ref Range   Glucose-Capillary 114 (*) 70 - 99 mg/dL  GLUCOSE, CAPILLARY     Status: Abnormal   Collection Time    09/27/13  5:26 PM      Result Value Ref Range   Glucose-Capillary 213 (*) 70 - 99 mg/dL  GLUCOSE, CAPILLARY     Status: Abnormal   Collection Time    09/27/13  8:43 PM      Result Value Ref Range   Glucose-Capillary 194 (*) 70 - 99 mg/dL  GLUCOSE, CAPILLARY     Status: Abnormal   Collection Time    09/28/13  7:27 AM      Result Value Ref Range   Glucose-Capillary 155 (*) 70 - 99 mg/dL   Comment 1 Notify RN        Constitutional: He is oriented to person, place, and time.  HENT: tongue midline, Multiple bruises to the face and forehead  Eyes: EOM are normal.  Neck:  Cervical collar in place  Cardiovascular: Normal rate and regular rhythm.  Respiratory: Effort normal and breath sounds normal. No respiratory distress.  GI: Soft. Bowel sounds are normal. He exhibits no distension.  Neurological: He is alert and oriented to person, place, and time.  Skin: bruising over face Left scalp staples intact motor strength is tr/5 in the right deltoid, 0 bicep, 1 tricep, 0 grip, WE trace  Motor strength is trace in the finger flexors on the left otherwise 0 in the deltoid bicep triceps  Right lower extremity 1+   minus hip knee extensor synergy 0/5 at the ankle dorsiflexor plantar flexors  Left lower extremity 2 minus hip flexor 3 minus knee extensor 1+ to 2 minus plantar flexor  Sensory intact to light touch in the right upper and left upper extremity (1/2), sensation slightly better in the LE's but inconsistent  No resting tone.   Absent to light touch right L5 and right S1 dermatomes  Intact to light touch left lower extremity    Assessment/Plan: 1. Functional deficits secondary to spastic tetraplegia central cord which require 3+ hours per day of interdisciplinary therapy in a comprehensive inpatient rehab setting. Physiatrist is providing close team supervision and 24 hour management of active medical problems listed below. Physiatrist and rehab team continue to assess barriers to discharge/monitor patient progress toward functional and medical goals. FIM: FIM - Bathing Bathing Steps Patient Completed:  (receives night baths with nursing) Bathing: 1: Total-Patient completes 0-2 of 10 parts or less than 25%  FIM - Upper Body Dressing/Undressing Upper body dressing/undressing steps patient completed:  (patient elected not to change into street clothing today) Upper body dressing/undressing: 0: Wears gown/pajamas-no public clothing FIM - Lower Body Dressing/Undressing Lower body dressing/undressing: 1: Total-Patient completed less than 25% of tasks  FIM - Toileting Toileting: 1: Total-Patient completed zero steps, helper did all 3  FIM - Air cabin crew Transfers: 0-Activity did not occur  FIM - Control and instrumentation engineer Devices: Sliding board Bed/Chair Transfer: 1: Two helpers  FIM - Locomotion: Wheelchair Locomotion: Wheelchair: 1: Travels less than 50 ft with moderate assistance (Pt: 50 - 74%) FIM - Locomotion: Ambulation Locomotion: Ambulation:  (unsafe to attempt)  Comprehension Comprehension Mode: Auditory Comprehension: 5-Understands complex 90% of  the time/Cues < 10% of the time  Expression Expression Mode: Verbal Expression: 5-Expresses basic needs/ideas:  With no assist  Social Interaction Social Interaction: 5-Interacts appropriately 90% of the time - Needs monitoring or encouragement for participation or interaction.  Problem Solving Problem Solving: 5-Solves complex 90% of the time/cues < 10% of the time  Memory Memory: 5-Recognizes or recalls 90% of the time/requires cueing < 10% of the time  Medical Problem List and Plan:  1. 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 Status post C2-C4 fusion  2. DVT Prophylaxis/Anticoagulation: SCDs.  Dopplers negative, no enoxaparin low plt 3. Pain Management/chronic back pain , Hydrocodone and Robaxin as needed. Monitor with increased mobility  4. Neuropsych: This patient is capable of making decisions on his own behalf.  5. History of CAD with angioplasty. No chest pain or shortness of breath  6. History of CVA. Plavix currently on hold discuss resuming neurosurgery soon 7. Diabetes mellitus with peripheral neuropathy. Monitor blood sugars closely while on Decadron taper. Lantus insulin  -titrated to 30u bid   -showing improvement--observe today 8. Hypertension. Lasix 20 mg daily (held), Avapro 75 mg daily, Toprol 50 mg daily, Norvasc 5 mg daily. Monitor with increased mobility  9. Hyperlipidemia. Zocor  10. History of multiple myeloma. I spoke to Dr. Alvy Bimler ---no plans on resuming revlimid  -feels that changes in counts are reactive to stress/illness/steroids as opposed to MM  -morganella UTI---sens to cipro  -WBC's down to 14k.. Platelets in 30k range  -continue daily CBC and observation clinically  11. Renal:   -Lasix remains on hold  -bmet pending  -encourage pO 12.  Insomnia: trial restoril  LOS (Days) 15 A FACE TO FACE EVALUATION WAS PERFORMED  Ameira Alessandrini T 09/28/2013, 8:28 AM

## 2013-09-28 NOTE — Progress Notes (Signed)
Social Work Patient ID: John Carter, male   DOB: [birth year], 80 y.o.   MRN: [id number]  John Mesi Prevatt, LCSW Social Worker Signed  Patient Care Conference Service date: 09/25/2013 10:14 PM  Inpatient RehabilitationTeam Conference and Plan of Care Update Date: 09/25/2013   Time: 2:05 PM     Patient Name: John Carter       Medical Record Number: [id number]   Date of Birth: [birth year] Sex: Male         Room/Bed: 4W18C/4W18C-01 Payor Info: Payor: MEDICARE / Plan: MEDICARE PART A AND B / Product Type: *No Product type* /   Admitting Diagnosis: CENTRAL CORD   Admit Date/Time:  09/13/2013  4:33 PM Admission Comments: No comment available   Primary Diagnosis:  <principal problem not specified> Principal Problem: <principal problem not specified>    Patient Active Problem List     Diagnosis  Date Noted   .  C3 spinal cord injury  09/11/2013   .  Closed fracture nasal bone  09/05/2013   .  Spondylosis, cervical, with myelopathy  09/05/2013   .  Central cord syndrome  08/30/2013   .  Thrombocytopenia  08/30/2013   .  Anemia  08/30/2013   .  Fall  08/30/2013   .  Knee pain, acute  08/27/2013   .  Other pancytopenia  04/16/2013   .  Unspecified deficiency anemia  08/06/2011   .  Postoperative anemia due to acute blood loss  07/01/2011   .  Hypertension     .  Hyperlipidemia     .  Coronary artery disease     .  Myocardial infarction     .  Sleep apnea     .  TIA (transient ischemic attack)     .  Arthritis     .  Chronic back pain     .  GERD (gastroesophageal reflux disease)     .  Hiatal hernia     .  Gastric ulcer     .  Diverticulosis     .  Hemorrhoids     .  History of colonic polyps     .  Urinary frequency     .  Nocturia     .  Leg cramps     .  Cataracts, bilateral     .  Multiple myeloma  06/18/2009     Expected Discharge Date: Expected Discharge Date: 10/12/13  Team Members Present: Physician leading conference: Dr. Alger Simons Social  Worker Present: Alfonse Alpers, LCSW Nurse Present: Other (comment) Whitman Hero, RN) PT Present: Canary Brim, PT OT Present: Salome Spotted, Starling Manns, OT;Patricia [place], OT PPS Coordinator present : Daiva Nakayama, RN, CRRN        Current Status/Progress  Goal  Weekly Team Focus   Medical     slow progress. standing   working on bowels still.   education continues regarding SCI. bowel mgt   Bowel/Bladder     on bowel program. foley catheter in place  bowels daily with continued bowel program. continue to monitor for s/s of infection while patient is on foley catheter  monitor bowel and bladder.   Swallow/Nutrition/ Hydration     Dys. 3 textures with thin liquids, Mod I for directing self-feeing  Mod I with least restrictive diet  trials of regular textures   ADL's     Total assist ADL and slide board transfer (1), maintains static sitting balance with min assist in prep for  transfer  Max A bathing and lower body dressing, Mod A upper body dressing, Min A dynamic sitting balance, Mod A transfers  Improved UE function, improved intellectual awareness, improved communication/direction with caregivers, sitting balance, weight-bearing, transfers   Mobility     +2 total assist for all mobility, some small gains in contribution during sliding board transfers  Mod assist sliding board transfer, max bed mobility -likely will be downgraded  NMR, sitting balance, stretching, rolling, transfers, upright tolerance.    Communication     at baseline  at baseline     Safety/Cognition/ Behavioral Observations           Pain     denies pain this shift.   pain less than 2  monitor for pain   Skin     incision to back to neck. honeycomb dressing per report. unable to assess d/t aspen collar in place. no skin breakdown  reposition q2h prn  assess skin q shift.    Rehab Goals Patient on target to meet rehab goals: Yes *See Care Plan and progress notes for long and short-term goals.    Barriers to  Discharge:  awareness of severity, see priro      Possible Resolutions to Barriers:    see prior      Discharge Planning/Teaching Needs:    Plan continues for SNF following CIR.  Pt is making progress toward 1 caregiver goals.  Still working on bowel/bladder plan for pt.   None due to plan for SNF tx    Team Discussion:    Pt continues to work toward goals of one caregiver level of care.  Pt is optimistic that things will improve, but MD has been educating pt of the severity of his condition so that pt can have a realistic picture.   Revisions to Treatment Plan:    None    Continued Need for Acute Rehabilitation Level of Care: The patient requires daily medical management by a physician with specialized training in physical medicine and rehabilitation for the following conditions: Daily direction of a multidisciplinary physical rehabilitation program to ensure safe treatment while eliciting the highest outcome that is of practical value to the patient.: Yes Daily medical management of patient stability for increased activity during participation in an intensive rehabilitation regime.: Yes Daily analysis of laboratory values and/or radiology reports with any subsequent need for medication adjustment of medical intervention for : Other;Neurological problems;Post surgical problems  Haydin Calandra, John Mesi 09/27/2013, 11:54 PM

## 2013-09-28 NOTE — Progress Notes (Signed)
Occupational Therapy Session and Weekly Progress Note  Patient Details  Name: John Carter MRN: [id number] Date of Birth: 1930-06-13  Today's Date: 09/28/2013 Time: 1000-1100 Time Calculation (min): 60 min  Patient has met 4 of 5 short term goals.  Patient demonstrates ability to initiate elbow flexion, approx 10% of required AROM, for assisted self-feeding skills.  Patient continues to demonstrate the following deficits: Impaired endurance, impaired static and dynamic sitting balance, impaired bil UE function, and impaired mobility and therefore will continue to benefit from skilled OT intervention to reduce burden of care during performance of assisted BADL.  Patient not progressing toward long term goals.  See goal revisions.  OT Short Term Goals Week 2:  OT Short Term Goal 1 (Week 2): Patient will verbalize 3 steps required for caregiver to complete slide board transfer bed <> w/c. OT Short Term Goal 1 - Progress (Week 2): Met OT Short Term Goal 2 (Week 2): Patient will demo ability to complete 25% of self-feeding with mod assist using AE and/or orthotics to stabilize wrist OT Short Term Goal 2 - Progress (Week 2): Partly met OT Short Term Goal 3 (Week 2): Patient will maintain static sitting balance at edge of bed for 30 seconds with min assist in prep for slide board transfer OT Short Term Goal 3 - Progress (Week 2): Met OT Short Term Goal 4 (Week 2): Patient will demo ability to pull either arm from bedrail when cued while sidelying in prep for assisted bed mobiltiy. OT Short Term Goal 4 - Progress (Week 2): Met OT Short Term Goal 5 (Week 2): Patient will demo ability to lift either leg off bed to assist with lacing pants with extra time and min verbal cues OT Short Term Goal 5 - Progress (Week 2): Met Week 3:  OT Short Term Goal 1 (Week 3): Patient will demo ability to complete 25% of self-feeding using AE with mod assist. OT Short Term Goal 2 (Week 3): Patient will maintain  side-lying position in prep for slide board transfer with min assist to maintain balance OT Short Term Goal 3 (Week 3): Patient will demo ability to lift shoulder off bed during assisted bed mobility, rolling to left or right, during assisted lower body dressing  Skilled Therapeutic Interventions/Progress Updates: Patient received supine in bed with spouse, patient's brother and his wife present.   Patient receptive for planned assisted ADL, reporting no change in status.    Patient's brother and sister-in-law then departed room to return to their residence in [place].  During setup prep for ADL, OT noted that patient's wife had donned patient's AE for self-feeding but had inverted wrist splint (top and bottom).   OT re-educated spouse on proper use of orthotics, labeling orthotics to clarify orientation.     OT then noted that patient was soiled from [facility] and he required total assist for cleansing, followed by total assist with lower body dressing.  Patient demo'd ability to lift left leg off bed approximately 2 inches during assisted lower body dressing but he was unable to rise right LE at all.    During assisted bed mobility patient was unable to elicit right hip or knee flexion this session when cued or complete partial shoulder extension to retract R-UE from bedrail during supported side-lying.   After total assist to rise from supine to sitting at edge of bed, OT noted patient's labored breathing and heavy respirations with moderate perspiration on his back and chest.  Patient reported significant fatigue  during activities and requested prolonged supported static sitting rest break, prior to slide board transfer.   Patient was unable to contribute assist with lifting his left leg or bear weight through LE during slide-board transfer this session (total assist required for transfer, pt = < 10%) and again required rest break after transfer.   Patient was left in w/c, reclined for comfort, with his arms  supported by pillows and spouse present attending to his needs.       Therapy Documentation Precautions:  Precautions Precautions: Fall;Cervical Precaution Comments: Central cord presentation - with sitting pt has some spasms causing quick forward lean so be cautious during sitting balance activities.  Required Braces or Orthoses: Cervical Brace Cervical Brace: Hard collar;At all times Restrictions Weight Bearing Restrictions: No  Vital Signs: Therapy Vitals Pulse Rate: 97 BP: 130/66 mmHg  Pain: 3/10 at anterior right shoulder    ADL: ADL ADL Comments: see FIM  See FIM for current functional status  Therapy/Group: Individual Therapy  [place] 09/28/2013, 7:26 PM

## 2013-09-28 NOTE — Progress Notes (Signed)
Speech Language Pathology Session Note & Discharge Summary  Patient Details  Name: John Carter MRN: [id number] Date of Birth: May 14, 1930  Today's Date: 09/28/2013 Time: 1300-1315 Time Calculation (min): 15 min  Skilled Therapeutic Interventions:  Treatment focused on patient/caregiver information. Upon arrival, pt's wife was feeding patient lunch meal of Dys. 3 textures with thin liquids. Pt did not demonstrate any overt s/s of aspiration and pt was Mod I for directing caregiver during self-feeding task. Pt and wife both reported they prefer for the patient to stay on his current diet of Dys. 3 textures with thin liquids. Patient and wife educated on pt's current swallowing function and handouts given to reinforce information. Education is complete, therefore, skilled SLP services are not warranted at this time.   Patient has met 2 of 2 long term goals.  Patient to discharge at overall Modified Independent level.   Reasons goals not met: N/A   Clinical Impression/Discharge Summary: Pt has made excellent gains and has met 2 of 2 LTG's this admission due to increased utilization of swallowing compensatory strategies. Currently, pt is consuming Dys. 3 textures with thin liquids without overt s/s of aspiration. Although pt is a dependent feeder, he can direct caregiver during self-feeding with Mod I to increase swallowing safety. Pt prefers to stay on current diet at this time and would benefit from f/u SLP services at next venue of care for possible upgrade. Pt/family eduction complete and handouts given to reinforce information  Care Partner:  Caregiver Able to Provide Assistance: No  Type of Caregiver Assistance: Physical  Recommendation:  Skilled Nursing facility  Rationale for SLP Follow Up: Reduce caregiver burden;Maximize swallowing safety   Equipment: N/A   Reasons for discharge: Treatment goals met   Patient/Family Agrees with Progress Made and Goals Achieved: Yes   See FIM  for current functional status  Eliga Arvie 09/28/2013, 1:27 PM

## 2013-09-28 NOTE — Progress Notes (Signed)
Physical Therapy Weekly Progress Note  Patient Details  Name: John Carter MRN: [id number] Date of Birth: February 08, 1930  Today's Date: 09/28/2013 Time: 1100-1200 Time Calculation (min): 60 min  Patient has met 1 of 3 short term goals.  Pt has made very little progress with ability to contribute during sliding board transfers, he is able to activate bil LEs at times however demos very little pushoff, he is also trying to rock trunk for momentum however does not have adequate trunk control for this to be helpful yet. He has demonstrated improvements with abiltiy to propel w/c with LEs and does best with backwards propulsion. Sitting balance is making slow gains but he is able to actively correct loss of balance but endurance remains a very limiting factor.   Patient continues to demonstrate the following deficits: impaired sitting balance, decreased ability to contribute to sliding board transfers, quadriparesis, poor endurance and therefore will continue to benefit from skilled PT intervention to enhance overall performance with activity tolerance, balance, postural control, ability to compensate for deficits and functional use of  right upper extremity, right lower extremity, left upper extremity and left lower extremity.  Patient progressing toward some long term goals..  Plan of care revisions: long term goals revised.  PT Short Term Goals Week 2:  PT Short Term Goal 1 (Week 2): Pt will perform sliding board transfer with +2 total assist (pt = 15%) PT Short Term Goal 1 - Progress (Week 2): Not met PT Short Term Goal 2 (Week 2): Pt will propel w/c wil bil LEs x 5' with max assist PT Short Term Goal 2 - Progress (Week 2): Met PT Short Term Goal 3 (Week 2): Pt will maintain static sitting balance x 2 min PT Short Term Goal 3 - Progress (Week 2): Partly met (with elbow support pt can meet goal)  Skilled Therapeutic Interventions/Progress Updates:    Session focused on transfer training,  sitting balance, and bil LE strengthening. Level sliding board transfer w/c to/from mat performed with +1 total assist (pt =~10%), second person for safety. Pt wearing shoes this session in attempt to help with LE contact with floor, not much difference noticed. Sitting balance edge of mat: static and dynamic balance (lateral & anterior/posterior weight shifting) with elbows both supported and unsupported. W/C propulsion with bil LEs - pt fatigued and unable to pull self forwards today but did push self x 10' backwards, min/mod assist for repositioning of feet. Wife present during session and encouraging. Pt stated at end of session "I hope I can get better."   Therapy Documentation Precautions:  Precautions Precautions: Fall;Cervical Precaution Comments: Central cord presentation - with sitting pt has some spasms causing quick forward lean so be cautious during sitting balance activities.  Required Braces or Orthoses: Cervical Brace Cervical Brace: Hard collar;At all times Restrictions Weight Bearing Restrictions: No Pain:  no c/o pain this session  See FIM for current functional status  Therapy/Group: Individual Therapy  [place] Rocker 09/28/2013, 12:07 PM

## 2013-09-28 NOTE — Progress Notes (Signed)
Physical Therapy Session Note  Patient Details  Name: John Carter MRN: [id number] Date of Birth: [birth year]  Today's Date: 09/28/2013 Time: 1415-1500 Time Calculation (min): 45 min  Skilled Therapeutic Interventions/Progress Updates:    Session focused on upright tolerance, LE weight bearing, and bil LE functional stretch in standing frame x 2 bouts with +2 assist to manage trunk and bil UEs into standing. Once in standing pt able to participate in single UE (performed alternately on both UEs) abduction/adduction activity with hand on rolling pad to decrease friction, elbow supported due to impaired proximal strength. Pt transfer w/c >bed with +2 total assist (pt<5% due to fatigue), sit>supine +2 total assist for bil LEs and trunk. Extended time for positioning.   Therapy Documentation Precautions:  Precautions Precautions: Fall;Cervical Precaution Comments: Central cord presentation - with sitting pt has some spasms causing quick forward lean so be cautious during sitting balance activities.  Required Braces or Orthoses: Cervical Brace Cervical Brace: Hard collar;At all times Restrictions Weight Bearing Restrictions: No Pain:  no c/o pain during session  See FIM for current functional status  Therapy/Group: Individual Therapy  [place] Rocker 09/28/2013, 3:17 PM

## 2013-09-29 ENCOUNTER — Encounter (HOSPITAL_COMMUNITY): Payer: Medicare Other | Admitting: *Deleted

## 2013-09-29 ENCOUNTER — Inpatient Hospital Stay (HOSPITAL_COMMUNITY): Payer: Medicare Other | Admitting: Physical Therapy

## 2013-09-29 DIAGNOSIS — R252 Cramp and spasm: Secondary | ICD-10-CM

## 2013-09-29 LAB — CBC
HEMATOCRIT: 31.7 % — AB (ref 39.0–52.0)
Hemoglobin: 10.6 g/dL — ABNORMAL LOW (ref 13.0–17.0)
MCH: 32.9 pg (ref 26.0–34.0)
MCHC: 33.4 g/dL (ref 30.0–36.0)
MCV: 98.4 fL (ref 78.0–100.0)
PLATELETS: 30 10*3/uL — AB (ref 150–400)
RBC: 3.22 MIL/uL — AB (ref 4.22–5.81)
RDW: 16.6 % — AB (ref 11.5–15.5)
WBC: 13.3 10*3/uL — AB (ref 4.0–10.5)

## 2013-09-29 LAB — GLUCOSE, CAPILLARY
GLUCOSE-CAPILLARY: 180 mg/dL — AB (ref 70–99)
GLUCOSE-CAPILLARY: 132 mg/dL — AB (ref 70–99)
GLUCOSE-CAPILLARY: 237 mg/dL — AB (ref 70–99)
GLUCOSE-CAPILLARY: 168 mg/dL — AB (ref 70–99)

## 2013-09-29 NOTE — Progress Notes (Signed)
Subjective/Complaints: Denies complaints. He is interested in his prognosis. He thinks he is improved some but only slightly during his admission.  Objective: Vital Signs: Blood pressure 122/71, pulse 101, temperature 97.7 F (36.5 C), temperature source Oral, resp. rate 16, height _0  (1.727 m), weight 192 lb 8 oz (87.317 kg), SpO2 94.00%.  Chest clear to auscultation. Cardiac exam S1-S2 are regular. Abdominal exam active bowel sounds, soft. Extremities no edema.  Assessment/Plan: 1. 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 Status post C2-C4 fusion  2. DVT Prophylaxis/Anticoagulation: SCDs.  Dopplers negative, no enoxaparin low plt 3. Pain Management/chronic back pain , Hydrocodone and Robaxin as needed. Monitor with increased mobility  4. Neuropsych: This patient is capable of making decisions on his own behalf.  5. History of CAD with angioplasty. No chest pain or shortness of breath  6. History of CVA. Plavix currently on hold discuss resuming neurosurgery soon 7. Diabetes mellitus with peripheral neuropathy. Monitor blood sugars closely while on Decadron taper. Lantus insulin  -titrated to 30u bid   CBG (last 3)   Recent Labs  09/28/13 1716 09/28/13 2059 09/29/13 0732  GLUCAP 202* 180* 180*   May need additional insulin therapy. 8. Hypertension. Lasix 20 mg daily (held), Avapro 75 mg daily, Toprol 50 mg daily, Norvasc 5 mg daily. Monitor with increased mobility   BP Readings from Last 3 Encounters:  09/29/13 122/71  09/13/13 112/48  09/13/13 112/48    9. Hyperlipidemia. Zocor  10. History of multiple myeloma. I spoke to Dr. Alvy Bimler ---no plans on resuming revlimid    11. Renal:    Lab Results  Component Value Date   CREATININE 0.58 09/28/2013    12.  Insomnia: Patient states he is sleeping well now.  LOS (Days) [place] 09/29/2013, 12:00 PM

## 2013-09-29 NOTE — Progress Notes (Signed)
Physical Therapy Session Note  Patient Details  Name: John Carter MRN: [id number] Date of Birth: [birth year]  Today's Date: 09/29/2013 Time: 1455-1520 Time Calculation (min): 25 min  Short Term Goals: Week 1:  PT Short Term Goal 1 (Week 1): Pt will transfer bed<> wheelchair total assist +2 (pt = 15%) PT Short Term Goal 1 - Progress (Week 1): Not met PT Short Term Goal 2 (Week 1): Pt will maintain static sitting balance x 1 min with mod assist PT Short Term Goal 2 - Progress (Week 1): Met PT Short Term Goal 3 (Week 1): Pt will direct w/c and sliding board set-up with mod verbal cues.  PT Short Term Goal 3 - Progress (Week 1): Met  Skilled Therapeutic Interventions/Progress Updates:  Pt sitting up in w/c. Performed LE exercises, AAROM with facilitation, 3 sets x 5 reps each.    Therapy Documentation Precautions:  Precautions Precautions: Fall;Cervical Precaution Comments: Central cord presentation - with sitting pt has some spasms causing quick forward lean so be cautious during sitting balance activities.  Required Braces or Orthoses: Cervical Brace Cervical Brace: Hard collar;At all times Restrictions Weight Bearing Restrictions: No General:   Pain: Pt c/o R shoulder pain.   See FIM for current functional status  Therapy/Group: Individual Therapy  Dub Amis 09/29/2013, 3:32 PM

## 2013-09-30 LAB — CBC
HCT: 31.9 % — ABNORMAL LOW (ref 39.0–52.0)
Hemoglobin: 10.9 g/dL — ABNORMAL LOW (ref 13.0–17.0)
MCH: 33.4 pg (ref 26.0–34.0)
MCHC: 34.2 g/dL (ref 30.0–36.0)
MCV: 97.9 fL (ref 78.0–100.0)
PLATELETS: 28 10*3/uL — AB (ref 150–400)
RBC: 3.26 MIL/uL — ABNORMAL LOW (ref 4.22–5.81)
RDW: 16.6 % — ABNORMAL HIGH (ref 11.5–15.5)
WBC: 12.3 10*3/uL — ABNORMAL HIGH (ref 4.0–10.5)

## 2013-09-30 LAB — GLUCOSE, CAPILLARY
GLUCOSE-CAPILLARY: 206 mg/dL — AB (ref 70–99)
Glucose-Capillary: 157 mg/dL — ABNORMAL HIGH (ref 70–99)
Glucose-Capillary: 159 mg/dL — ABNORMAL HIGH (ref 70–99)
Glucose-Capillary: 169 mg/dL — ABNORMAL HIGH (ref 70–99)

## 2013-09-30 MED ORDER — INSULIN GLARGINE 100 UNIT/ML ~~LOC~~ SOLN
33.0000 [IU] | Freq: Two times a day (BID) | SUBCUTANEOUS | Status: DC
  Administered 2013-09-30 – 2013-10-10 (×20): 33 [IU] via SUBCUTANEOUS
  Filled 2013-09-30 (×40): qty 0.33

## 2013-09-30 MED ORDER — INSULIN GLARGINE 100 UNIT/ML ~~LOC~~ SOLN
33.0000 [IU] | Freq: Two times a day (BID) | SUBCUTANEOUS | Status: DC
  Filled 2013-09-30 (×2): qty 0.33

## 2013-09-30 NOTE — Progress Notes (Signed)
Subjective/Complaints: No specific complaints this morning. He is a well. He slept well.  Objective: Vital Signs: Blood pressure 123/68, pulse 88, temperature 98.1 F (36.7 C), temperature source Oral, resp. rate 16, height 5' 8" (1.727 m), weight 192 lb 8 oz (87.317 kg), SpO2 96.00%.  No acute distress. Elderly male lying in bed. Chest clear to auscultation. Cardiac exam S1-S2 are regular. Abdominal exam active bowel sounds, soft. 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 He is able to move his left lower extremity.  Assessment/Plan: 1. 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 Status post C2-C4 fusion  2. DVT Prophylaxis/Anticoagulation: SCDs.  Dopplers negative, no enoxaparin low plt 3. Pain Management/chronic back pain , Hydrocodone and Robaxin as needed. Monitor with increased mobility  4. Neuropsych: This patient is capable of making decisions on his own behalf.  5. History of CAD with angioplasty. No chest pain or shortness of breath  6. History of CVA. Plavix currently on hold discuss resuming neurosurgery soon 7. Diabetes mellitus with peripheral neuropathy. Monitor blood sugars closely while on Decadron taper. Lantus insulin  -titrated to 30u bid   CBG (last 3)   Recent Labs  09/29/13 1609 09/29/13 2130 09/30/13 0708  GLUCAP 237* 168* 157*   I will increase Lantus. 8. Hypertension. Lasix 20 mg daily (held), Avapro 75 mg daily, Toprol 50 mg daily, Norvasc 5 mg daily. Monitor with increased mobility   BP Readings from Last 3 Encounters:  09/30/13 123/68  09/13/13 112/48  09/13/13 112/48    9. Hyperlipidemia. Zocor  10. History of multiple myeloma. I spoke to Dr. Gorsuch ---no plans on resuming revlimid    11. Renal:    Lab Results  Component Value Date   CREATININE 0.58 09/28/2013    12.  Insomnia: Patient states he is sleeping  well now.  LOS (Days) 17 A FACE TO FACE EVALUATION WAS PERFORMED  SWORDS,BRUCE HENRY 09/30/2013, 9:12 AM    

## 2013-10-01 ENCOUNTER — Inpatient Hospital Stay (HOSPITAL_COMMUNITY): Payer: Medicare Other | Admitting: Physical Therapy

## 2013-10-01 ENCOUNTER — Inpatient Hospital Stay (HOSPITAL_COMMUNITY): Payer: Medicare Other

## 2013-10-01 ENCOUNTER — Encounter (HOSPITAL_COMMUNITY): Payer: Medicare Other | Admitting: *Deleted

## 2013-10-01 LAB — CBC
HEMATOCRIT: 32.2 % — AB (ref 39.0–52.0)
Hemoglobin: 10.7 g/dL — ABNORMAL LOW (ref 13.0–17.0)
MCH: 32.7 pg (ref 26.0–34.0)
MCHC: 33.2 g/dL (ref 30.0–36.0)
MCV: 98.5 fL (ref 78.0–100.0)
Platelets: 28 10*3/uL — CL (ref 150–400)
RBC: 3.27 MIL/uL — ABNORMAL LOW (ref 4.22–5.81)
RDW: 16.7 % — AB (ref 11.5–15.5)
WBC: 10.7 10*3/uL — AB (ref 4.0–10.5)

## 2013-10-01 LAB — GLUCOSE, CAPILLARY
GLUCOSE-CAPILLARY: 153 mg/dL — AB (ref 70–99)
Glucose-Capillary: 157 mg/dL — ABNORMAL HIGH (ref 70–99)
Glucose-Capillary: 196 mg/dL — ABNORMAL HIGH (ref 70–99)
Glucose-Capillary: 160 mg/dL — ABNORMAL HIGH (ref 70–99)

## 2013-10-01 NOTE — Progress Notes (Signed)
Orthopedic Tech Progress Note Patient Details:  John Carter 12/18/1929 [id number]  Patient ID: [name], male   DOB: [birth year], 80 y.o.   MRN: [id number]   Irish Elders 10/01/2013, 8:58 [facility] for bilateral WHO braces.

## 2013-10-01 NOTE — Progress Notes (Signed)
Subjective/Complaints: No new problems. Denies pain except for discomfort in his right shoulder Review of Systems - Negative except weakness in all 4 limbs  Objective: Vital Signs: Blood pressure 126/74, pulse 87, temperature 97.5 F (36.4 C), temperature source Oral, resp. rate 16, height $RemoveBe'5\' 8"'[id number]$  (1.727 m), weight 87.317 kg (192 lb 8 oz), SpO2 95.00%. No results found. Results for orders placed during the hospital encounter of 09/13/13 (from the past 72 hour(s))  GLUCOSE, CAPILLARY     Status: Abnormal   Collection Time    09/28/13 11:48 AM      Result Value Ref Range   Glucose-Capillary 152 (*) 70 - 99 mg/dL   Comment 1 Notify RN    GLUCOSE, CAPILLARY     Status: Abnormal   Collection Time    09/28/13  5:16 PM      Result Value Ref Range   Glucose-Capillary 202 (*) 70 - 99 mg/dL  GLUCOSE, CAPILLARY     Status: Abnormal   Collection Time    09/28/13  8:59 PM      Result Value Ref Range   Glucose-Capillary 180 (*) 70 - 99 mg/dL   Comment 1 Notify RN    CBC     Status: Abnormal   Collection Time    09/29/13  4:15 AM      Result Value Ref Range   WBC 13.3 (*) 4.0 - 10.5 K/uL   RBC 3.22 (*) 4.22 - 5.81 MIL/uL   Hemoglobin 10.6 (*) 13.0 - 17.0 g/dL   HCT 31.7 (*) 39.0 - 52.0 %   MCV 98.4  78.0 - 100.0 fL   MCH 32.9  26.0 - 34.0 pg   MCHC 33.4  30.0 - 36.0 g/dL   RDW 16.6 (*) 11.5 - 15.5 %   Platelets 30 (*) 150 - 400 K/uL   Comment: CONSISTENT WITH PREVIOUS RESULT  GLUCOSE, CAPILLARY     Status: Abnormal   Collection Time    09/29/13  7:32 AM      Result Value Ref Range   Glucose-Capillary 180 (*) 70 - 99 mg/dL   Comment 1 Notify RN    GLUCOSE, CAPILLARY     Status: Abnormal   Collection Time    09/29/13 12:01 PM      Result Value Ref Range   Glucose-Capillary 132 (*) 70 - 99 mg/dL  GLUCOSE, CAPILLARY     Status: Abnormal   Collection Time    09/29/13  4:09 PM      Result Value Ref Range   Glucose-Capillary 237 (*) 70 - 99 mg/dL   Comment 1 Notify RN    GLUCOSE,  CAPILLARY     Status: Abnormal   Collection Time    09/29/13  9:30 PM      Result Value Ref Range   Glucose-Capillary 168 (*) 70 - 99 mg/dL   Comment 1 Notify RN    CBC     Status: Abnormal   Collection Time    09/30/13  6:25 AM      Result Value Ref Range   WBC 12.3 (*) 4.0 - 10.5 K/uL   RBC 3.26 (*) 4.22 - 5.81 MIL/uL   Hemoglobin 10.9 (*) 13.0 - 17.0 g/dL   HCT 31.9 (*) 39.0 - 52.0 %   MCV 97.9  78.0 - 100.0 fL   MCH 33.4  26.0 - 34.0 pg   MCHC 34.2  30.0 - 36.0 g/dL   RDW 16.6 (*) 11.5 - 15.5 %   Platelets  28 (*) 150 - 400 K/uL   Comment: CONSISTENT WITH PREVIOUS RESULT     CRITICAL VALUE NOTED.  VALUE IS CONSISTENT WITH PREVIOUSLY REPORTED AND CALLED VALUE.  GLUCOSE, CAPILLARY     Status: Abnormal   Collection Time    09/30/13  7:08 AM      Result Value Ref Range   Glucose-Capillary 157 (*) 70 - 99 mg/dL   Comment 1 Notify RN    GLUCOSE, CAPILLARY     Status: Abnormal   Collection Time    09/30/13 11:19 AM      Result Value Ref Range   Glucose-Capillary 159 (*) 70 - 99 mg/dL   Comment 1 Notify RN    GLUCOSE, CAPILLARY     Status: Abnormal   Collection Time    09/30/13  4:38 PM      Result Value Ref Range   Glucose-Capillary 169 (*) 70 - 99 mg/dL   Comment 1 Notify RN    GLUCOSE, CAPILLARY     Status: Abnormal   Collection Time    09/30/13  8:57 PM      Result Value Ref Range   Glucose-Capillary 206 (*) 70 - 99 mg/dL   Comment 1 Notify RN    CBC     Status: Abnormal   Collection Time    10/01/13  6:19 AM      Result Value Ref Range   WBC 10.7 (*) 4.0 - 10.5 K/uL   RBC 3.27 (*) 4.22 - 5.81 MIL/uL   Hemoglobin 10.7 (*) 13.0 - 17.0 g/dL   HCT 32.2 (*) 39.0 - 52.0 %   MCV 98.5  78.0 - 100.0 fL   MCH 32.7  26.0 - 34.0 pg   MCHC 33.2  30.0 - 36.0 g/dL   RDW 16.7 (*) 11.5 - 15.5 %   Platelets 28 (*) 150 - 400 K/uL   Comment: REPEATED TO VERIFY     CRITICAL RESULT CALLED TO, READ BACK BY AND VERIFIED WITH:     J.Gaspar Garbe [id number] 10/01/13 CLARK,S  GLUCOSE,  CAPILLARY     Status: Abnormal   Collection Time    10/01/13  7:28 AM      Result Value Ref Range   Glucose-Capillary 153 (*) 70 - 99 mg/dL   Comment 1 Notify RN        Constitutional: He is oriented to person, place, and time.  HENT: tongue midline, Multiple bruises to the face and forehead  Eyes: EOM are normal.  Neck:  Cervical collar in place  Cardiovascular: Normal rate and regular rhythm.  Respiratory: Effort normal and breath sounds normal. No respiratory distress.  GI: Soft. Bowel sounds are normal. He exhibits no distension.  Neurological: He is alert and oriented to person, place, and time.  Skin: bruising over face Left scalp staples intact motor strength is tr/5 in the right deltoid, 0 bicep, 1 tricep, 0 grip, WE trace  Motor strength is trace in the finger flexors on the left otherwise 0 in the deltoid bicep triceps  Right lower extremity 1+  minus hip knee extensor synergy 0/5 at the ankle dorsiflexor plantar flexors  Left lower extremity 2 minus hip flexor 3 minus knee extensor 1+ to 2 minus plantar flexor  Sensory intact to light touch in the right upper and left upper extremity (1/2), sensation slightly better in the LE's but inconsistent  No resting tone.   Absent to light touch right L5 and right S1 dermatomes  Intact to light touch left  lower extremity    Assessment/Plan: 1. Functional deficits secondary to spastic tetraplegia central cord which require 3+ hours per day of interdisciplinary therapy in a comprehensive inpatient rehab setting. Physiatrist is providing close team supervision and 24 hour management of active medical problems listed below. Physiatrist and rehab team continue to assess barriers to discharge/monitor patient progress toward functional and medical goals. FIM: FIM - Bathing Bathing Steps Patient Completed:  (receives night baths with nursing) Bathing: 1: Total-Patient completes 0-2 of 10 parts or less than 25%  FIM - Upper Body  Dressing/Undressing Upper body dressing/undressing steps patient completed:  (patient elected not to change into street clothing today) Upper body dressing/undressing: 1: Total-Patient completed less than 25% of tasks FIM - Lower Body Dressing/Undressing Lower body dressing/undressing: 1: Total-Patient completed less than 25% of tasks  FIM - Toileting Toileting: 1: Total-Patient completed zero steps, helper did all 3  FIM - Air cabin crew Transfers: 0-Activity did not occur  FIM - Control and instrumentation engineer Devices: Sliding board Bed/Chair Transfer: 1: Mechanical lift  FIM - Locomotion: Wheelchair Locomotion: Wheelchair: 1: Travels less than 50 ft with moderate assistance (Pt: 50 - 74%) FIM - Locomotion: Ambulation Locomotion: Ambulation:  (unsafe to attempt)  Comprehension Comprehension Mode: Auditory Comprehension: 6-Follows complex conversation/direction: With extra time/assistive device  Expression Expression Mode: Verbal Expression: 6-Expresses complex ideas: With extra time/assistive device  Social Interaction Social Interaction: 6-Interacts appropriately with others with medication or extra time (anti-anxiety, antidepressant).  Problem Solving Problem Solving: 6-Solves complex problems: With extra time  Memory Memory: 6-More than reasonable amt of time  Medical Problem List and Plan:  1. 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 Status post C2-C4 fusion  2. DVT Prophylaxis/Anticoagulation: SCDs.  Dopplers negative, no enoxaparin low plt 3. Pain Management/chronic back pain , Hydrocodone and Robaxin as needed. Monitor with increased mobility  4. Neuropsych: This patient is capable of making decisions on his own behalf.  5. History of CAD with angioplasty. No chest pain or shortness of breath. Hold plavix for now given platelet count 6. History of CVA. Plavix currently on hold discuss resuming neurosurgery soon 7. Diabetes  mellitus with peripheral neuropathy. Monitor blood sugars closely while on Decadron taper. Lantus insulin  -titrated to 33 u bid  -showing improvement  8. Hypertension. Lasix 20 mg daily (held), Avapro 75 mg daily, Toprol 50 mg daily, Norvasc 5 mg daily. Monitor with increased mobility  9. Hyperlipidemia. Zocor  10. History of multiple myeloma. I spoke to Dr. Alvy Bimler ---no plans on resuming revlimid  -feels that changes in counts are reactive to stress/illness/steroids as opposed to MM  -morganella UTI---sens to cipro  -WBC's down to 10.7k.Marland Kitchen Platelets 28k  -continue daily CBC and observation clinically  11. Renal:   -Lasix resumed low dose  -recheck bmet tomorrow  -encourage pO 12.  Insomnia: trial restoril  LOS (Days) 18 A FACE TO FACE EVALUATION WAS PERFORMED  SWARTZ,ZACHARY T 10/01/2013, 8:50 AM

## 2013-10-01 NOTE — Progress Notes (Signed)
Occupational Therapy Session Note  Patient Details  Name: John Carter MRN: [id number] Date of Birth: Feb 26, 1930  Today's Date: 10/01/2013 Time: 0930-1030 Time Calculation (min): 60 min  Short Term Goals: Week 3:  OT Short Term Goal 1 (Week 3): Patient will demo ability to complete 25% of self-feeding using AE with mod assist. OT Short Term Goal 2 (Week 3): Patient will maintain side-lying position in prep for slide board transfer with min assist to maintain balance OT Short Term Goal 3 (Week 3): Patient will demo ability to lift shoulder off bed during assisted bed mobility, rolling to left or right, during assisted lower body dressing  Skilled Therapeutic Interventions/Progress Updates: ADL-retraining with focus on bed mobility, static sitting balance, UE strengthening.   Patient able to perform max assist bed mobility, rolling to his right with facilitation to lift left shoulder and rotate trunk.   Patient is able to initiate left hip and knee flexion but remains dependent for AROM at right U/LE to initiate trunk rotation.   After total assist with lower body dressing, patient was positioned to sitting at edge of bed (total assist to rise from supine to sit) for continuation of trunk righting exercises and static sitting balance.  Patient was able to maintain sitting balance no longer than 15 seconds this session, reporting lightheadedness initially which resolved within 5 minutes.   After total assist squat pivot transfer to w/c, patient completed 10 reps of AAROM (combiniting shoulder flexion and elbow extension followed by elbow flexion and shoulder extension).    Therapy Documentation Precautions:  Precautions Precautions: Fall;Cervical Precaution Comments: Central cord presentation - with sitting pt has some spasms causing quick forward lean so be cautious during sitting balance activities.  Required Braces or Orthoses: Cervical Brace Cervical Brace: Hard collar;At all  times Restrictions Weight Bearing Restrictions: No  Vital Signs: Therapy Vitals Temp: 97.8 F (36.6 C) Temp src: Oral Pulse Rate: 91 Resp: 16 BP: 138/61 mmHg Patient Position, if appropriate: Lying Oxygen Therapy SpO2: 96 %  Pain: No pain   ADL: ADL ADL Comments: see FIM  See FIM for current functional status  Therapy/Group: Individual Therapy  Second session: Time: 8657-8469 Time Calculation (min):  45 min  Pain Assessment:No pain  Skilled Therapeutic Interventions: Therapeutic activity with focus on sit <> stand using standing frame and UE AAROM exercises.   Patient completed sit <> stand, maintaining standing for 5 minutes, rest to sitting x 5 min, standing for 3 more minutes.   With setup assist and manual facilitation, patient performed shoulder internal/external rotation at standing frame table top using hand skate, 10 reps each side.   Patient was returned to his room and completed additional AAROM at bil UE, 10 reps, elbow flexion/extension with OT supporting elbow and wrist to eliminate gravity.   Patient demo'd improved endurance with movements sequenced as discrete 3 steps: beginning 1/3, middle 1/3, terminating 1/3.   Sequencing movement patterns allowed patient to recovery to provide maximum effort throughout movement.  See FIM for current functional status  Therapy/Group: Individual Therapy  [place] 10/01/2013, 3:24 PM

## 2013-10-01 NOTE — Progress Notes (Signed)
Physical Therapy Session Note  Patient Details  Name: John Carter MRN: [id number] Date of Birth: 1930/03/22  Today's Date: 10/01/2013 Time: 1030-1130 Time Calculation (min): 60 min  Short Term Goals: Week 3:  PT Short Term Goal 1 (Week 3): Pt will maintain static sitting balance without UE support x 15 sec, and 10 min with elbow support PT Short Term Goal 2 (Week 3): Pt will self-direct steps and set up for sliding board and w/c set-up for transfer with </= 1 verbal cue.  PT Short Term Goal 3 (Week 3): Pt will be able to remind staff to tilt him in w/c for pressure relief with only min cues  Skilled Therapeutic Interventions/Progress Updates:    Seated in w/c at start- ready for therapy. Sliding board transfer w/c <> mat +2 total assist pt = 10%. Static sitting balace with bil UEs supported under elbows with pillows (pt reports decreased shoulder pain (<3/10) with supported elbows, up to 1.75 min hold without physical assist in this position. Increased to dynamic forwards and lateral trunk leans with modulated return to midline. Supine there-ex: bridging (slight ability to lift buttocks), heel slides with resisted extension, adductor squeeze 2 x 10 reps each. Supine <> sit +2 total assist pt= <5%. Pt able to self-direct set-up and caregiver assist with min verbal cues.    Shoes donned during session to help with LE contribution during transfer.   Second Session Time:  3976-7341 Time Calculation (min): 30 min Skilled Therapeutic Interventions/Progress Updates:  Pt desired to go outside for session. Practiced functional movement and placement of bil LEs forwards/backwards while seated in w/c progressing to forwards/backwards propulsion. More weakness noted with Rt LE. Pain <3/10 Rt shoulder.   Therapy Documentation Precautions:  Precautions Precautions: Fall;Cervical Precaution Comments: Central cord presentation - with sitting pt has some spasms causing quick forward lean so be  cautious during sitting balance activities.  Required Braces or Orthoses: Cervical Brace Cervical Brace: Hard collar;At all times Restrictions Weight Bearing Restrictions: No  See FIM for current functional status  Therapy/Group: Individual Therapy both sessions  Lahoma Rocker 10/01/2013, 3:47 PM

## 2013-10-02 ENCOUNTER — Inpatient Hospital Stay (HOSPITAL_COMMUNITY): Payer: Medicare Other

## 2013-10-02 ENCOUNTER — Inpatient Hospital Stay (HOSPITAL_COMMUNITY): Payer: Medicare Other | Admitting: Physical Therapy

## 2013-10-02 ENCOUNTER — Encounter (HOSPITAL_COMMUNITY): Payer: Medicare Other | Admitting: *Deleted

## 2013-10-02 LAB — BASIC METABOLIC PANEL
BUN: 34 mg/dL — ABNORMAL HIGH (ref 6–23)
CHLORIDE: 99 meq/L (ref 96–112)
CO2: 23 meq/L (ref 19–32)
CREATININE: 0.52 mg/dL (ref 0.50–1.35)
Calcium: 8.2 mg/dL — ABNORMAL LOW (ref 8.4–10.5)
GFR calc Af Amer: >90 mL/min (ref >90)
GFR calc non Af Amer: >90 mL/min (ref >90)
GLUCOSE: 155 mg/dL — AB (ref 70–99)
Potassium: 4.3 mEq/L (ref 3.7–5.3)
Sodium: 135 mEq/L — ABNORMAL LOW (ref 137–147)

## 2013-10-02 LAB — CBC
HEMATOCRIT: 31.7 % — AB (ref 39.0–52.0)
HEMOGLOBIN: 10.6 g/dL — AB (ref 13.0–17.0)
MCH: 32.7 pg (ref 26.0–34.0)
MCHC: 33.4 g/dL (ref 30.0–36.0)
MCV: 97.8 fL (ref 78.0–100.0)
Platelets: 34 10*3/uL — ABNORMAL LOW (ref 150–400)
RBC: 3.24 MIL/uL — AB (ref 4.22–5.81)
RDW: 16.6 % — ABNORMAL HIGH (ref 11.5–15.5)
WBC: 11.2 10*3/uL — AB (ref 4.0–10.5)

## 2013-10-02 LAB — GLUCOSE, CAPILLARY
GLUCOSE-CAPILLARY: 207 mg/dL — AB (ref 70–99)
Glucose-Capillary: 133 mg/dL — ABNORMAL HIGH (ref 70–99)
Glucose-Capillary: 101 mg/dL — ABNORMAL HIGH (ref 70–99)
Glucose-Capillary: 170 mg/dL — ABNORMAL HIGH (ref 70–99)

## 2013-10-02 MED ORDER — CALCIUM POLYCARBOPHIL 625 MG PO TABS
625.0000 mg | ORAL_TABLET | Freq: Two times a day (BID) | ORAL | Status: DC
  Administered 2013-10-02 – 2013-10-08 (×13): 625 mg via ORAL
  Administered 2013-10-09: 22:00:00 via ORAL
  Administered 2013-10-09: 625 mg via ORAL
  Administered 2013-10-10: 09:00:00 via ORAL
  Filled 2013-10-02 (×18): qty 1

## 2013-10-02 MED ORDER — SACCHAROMYCES BOULARDII 250 MG PO CAPS
500.0000 mg | ORAL_CAPSULE | Freq: Two times a day (BID) | ORAL | Status: DC
  Administered 2013-10-02 – 2013-10-10 (×16): 500 mg via ORAL
  Filled 2013-10-02 (×18): qty 2

## 2013-10-02 NOTE — Progress Notes (Signed)
Subjective/Complaints: No new complaints. Felt he stood a little longer. Wonders why his voice isn't as strong as it used to be.  Review of Systems - Negative except weakness in all 4 limbs  Objective: Vital Signs: Blood pressure 141/73, pulse 82, temperature 98.1 F (36.7 C), temperature source Oral, resp. rate 18, height _0  (1.727 m), weight 87.317 kg (192 lb 8 oz), SpO2 98.00%. No results found. Results for orders placed during the hospital encounter of 09/13/13 (from the past 72 hour(s))  GLUCOSE, CAPILLARY     Status: Abnormal   Collection Time    09/29/13 12:01 PM      Result Value Ref Range   Glucose-Capillary 132 (*) 70 - 99 mg/dL  GLUCOSE, CAPILLARY     Status: Abnormal   Collection Time    09/29/13  4:09 PM      Result Value Ref Range   Glucose-Capillary 237 (*) 70 - 99 mg/dL   Comment 1 Notify RN    GLUCOSE, CAPILLARY     Status: Abnormal   Collection Time    09/29/13  9:30 PM      Result Value Ref Range   Glucose-Capillary 168 (*) 70 - 99 mg/dL   Comment 1 Notify RN    CBC     Status: Abnormal   Collection Time    09/30/13  6:25 AM      Result Value Ref Range   WBC 12.3 (*) 4.0 - 10.5 K/uL   RBC 3.26 (*) 4.22 - 5.81 MIL/uL   Hemoglobin 10.9 (*) 13.0 - 17.0 g/dL   HCT 31.9 (*) 39.0 - 52.0 %   MCV 97.9  78.0 - 100.0 fL   MCH 33.4  26.0 - 34.0 pg   MCHC 34.2  30.0 - 36.0 g/dL   RDW 16.6 (*) 11.5 - 15.5 %   Platelets 28 (*) 150 - 400 K/uL   Comment: CONSISTENT WITH PREVIOUS RESULT     CRITICAL VALUE NOTED.  VALUE IS CONSISTENT WITH PREVIOUSLY REPORTED AND CALLED VALUE.  GLUCOSE, CAPILLARY     Status: Abnormal   Collection Time    09/30/13  7:08 AM      Result Value Ref Range   Glucose-Capillary 157 (*) 70 - 99 mg/dL   Comment 1 Notify RN    GLUCOSE, CAPILLARY     Status: Abnormal   Collection Time    09/30/13 11:19 AM      Result Value Ref Range   Glucose-Capillary 159 (*) 70 - 99 mg/dL   Comment 1 Notify RN    GLUCOSE, CAPILLARY     Status: Abnormal    Collection Time    09/30/13  4:38 PM      Result Value Ref Range   Glucose-Capillary 169 (*) 70 - 99 mg/dL   Comment 1 Notify RN    GLUCOSE, CAPILLARY     Status: Abnormal   Collection Time    09/30/13  8:57 PM      Result Value Ref Range   Glucose-Capillary 206 (*) 70 - 99 mg/dL   Comment 1 Notify RN    CBC     Status: Abnormal   Collection Time    10/01/13  6:19 AM      Result Value Ref Range   WBC 10.7 (*) 4.0 - 10.5 K/uL   RBC 3.27 (*) 4.22 - 5.81 MIL/uL   Hemoglobin 10.7 (*) 13.0 - 17.0 g/dL   HCT 32.2 (*) 39.0 - 52.0 %   MCV 98.5  78.0 - 100.0 fL   MCH 32.7  26.0 - 34.0 pg   MCHC 33.2  30.0 - 36.0 g/dL   RDW 16.7 (*) 11.5 - 15.5 %   Platelets 28 (*) 150 - 400 K/uL   Comment: REPEATED TO VERIFY     CRITICAL RESULT CALLED TO, READ BACK BY AND VERIFIED WITH:     J.MUNOZ,RN 0940 10/01/13 CLARK,S  GLUCOSE, CAPILLARY     Status: Abnormal   Collection Time    10/01/13  7:28 AM      Result Value Ref Range   Glucose-Capillary 153 (*) 70 - 99 mg/dL   Comment 1 Notify RN    GLUCOSE, CAPILLARY     Status: Abnormal   Collection Time    10/01/13 11:35 AM      Result Value Ref Range   Glucose-Capillary 157 (*) 70 - 99 mg/dL   Comment 1 Notify RN    GLUCOSE, CAPILLARY     Status: Abnormal   Collection Time    10/01/13  4:21 PM      Result Value Ref Range   Glucose-Capillary 196 (*) 70 - 99 mg/dL   Comment 1 Notify RN    GLUCOSE, CAPILLARY     Status: Abnormal   Collection Time    10/01/13  9:06 PM      Result Value Ref Range   Glucose-Capillary 160 (*) 70 - 99 mg/dL  GLUCOSE, CAPILLARY     Status: Abnormal   Collection Time    10/02/13  7:30 AM      Result Value Ref Range   Glucose-Capillary 133 (*) 70 - 99 mg/dL      Constitutional: He is oriented to person, place, and time.  HENT: tongue midline, Multiple bruises to the face and forehead  Eyes: EOM are normal.  Neck:  Cervical collar in place  Cardiovascular: Normal rate and regular rhythm.  Respiratory:  Effort normal and breath sounds normal. No respiratory distress.  GI: Soft. Bowel sounds are normal. He exhibits no distension.  Neurological: He is alert and oriented to person, place, and time.  Skin: bruising over face Left scalp staples intact motor strength is tr/5 in the right deltoid, 0 bicep, 1 tricep, 0 grip, WE trace  Motor strength is trace in the finger flexors on the left otherwise 0 in the deltoid bicep triceps  Right lower extremity 1+  minus hip knee extensor synergy 0/5 at the ankle dorsiflexor plantar flexors  Left lower extremity 2 minus hip flexor 3 minus knee extensor 1+ to 2 minus plantar flexor  Sensory intact to light touch in the right upper and left upper extremity (1/2), sensation slightly better in the LE's but inconsistent  Still no resting tone.   Absent to light touch right L5 and right S1 dermatomes  Intact to light touch left lower extremity    Assessment/Plan: 1. Functional deficits secondary to spastic tetraplegia central cord which require 3+ hours per day of interdisciplinary therapy in a comprehensive inpatient rehab setting. Physiatrist is providing close team supervision and 24 hour management of active medical problems listed below. Physiatrist and rehab team continue to assess barriers to discharge/monitor patient progress toward functional and medical goals.  Continued SCI education provided today.   Needs WHO"s onat night   FIM: FIM - Bathing Bathing Steps Patient Completed:  (receives night baths with nursing) Bathing: 1: Total-Patient completes 0-2 of 10 parts or less than 25%  FIM - Upper Body Dressing/Undressing Upper body dressing/undressing steps patient  completed:  (patient elected not to change into street clothing today) Upper body dressing/undressing: 1: Total-Patient completed less than 25% of tasks FIM - Lower Body Dressing/Undressing Lower body dressing/undressing: 1: Total-Patient completed less than 25% of tasks  FIM -  Toileting Toileting: 1: Total-Patient completed zero steps, helper did all 3  FIM - Air cabin crew Transfers: 0-Activity did not occur  FIM - Control and instrumentation engineer Devices: Sliding board Bed/Chair Transfer: 1: Two helpers  FIM - Locomotion: Wheelchair Locomotion: Wheelchair: 1: Travels less than 50 ft with moderate assistance (Pt: 50 - 74%) FIM - Locomotion: Ambulation Locomotion: Ambulation:  (unsafe to attempt)  Comprehension Comprehension Mode: Auditory Comprehension: 5-Understands complex 90% of the time/Cues < 10% of the time  Expression Expression Mode: Verbal Expression: 6-Expresses complex ideas: With extra time/assistive device  Social Interaction Social Interaction: 6-Interacts appropriately with others with medication or extra time (anti-anxiety, antidepressant).  Problem Solving Problem Solving: 6-Solves complex problems: With extra time  Memory Memory: 6-More than reasonable amt of time  Medical Problem List and Plan:  1. 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 Status post C2-C4 fusion  2. DVT Prophylaxis/Anticoagulation: SCDs.  Dopplers negative, no enoxaparin low plt 3. Pain Management/chronic back pain , Hydrocodone and Robaxin as needed. Monitor with increased mobility  4. Neuropsych: This patient is capable of making decisions on his own behalf.  5. History of CAD with angioplasty. No chest pain or shortness of breath. Hold plavix for now given platelet count 6. History of CVA. Plavix currently on hold discuss resuming neurosurgery soon 7. Diabetes mellitus with peripheral neuropathy. Monitor blood sugars closely while on Decadron taper. Lantus insulin  -titrated to 33 u bid  -fair control at present 8. Hypertension. Lasix 20 mg daily (held), Avapro 75 mg daily, Toprol 50 mg daily, Norvasc 5 mg daily. Monitor with increased mobility  9. Hyperlipidemia. Zocor  10. History of multiple myeloma. I spoke to  Dr. Alvy Bimler ---no plans on resuming revlimid  -feels that changes in counts are reactive to stress/illness/steroids as opposed to MM  -morganella UTI---sens to cipro  -WBC's down to 10.7k.Marland Kitchen Platelets 28-30k,   -continue daily CBC and observation clinically  11. Renal:   -Lasix resumed low dose  -bmet pending  -encourage pO 12.  Insomnia: trial restoril  LOS (Days) 19 A FACE TO FACE EVALUATION WAS PERFORMED  Lazette Estala T 10/02/2013, 8:18 AM

## 2013-10-02 NOTE — Progress Notes (Signed)
Physical Therapy Session Note  Patient Details  Name: John Carter MRN: [id number] Date of Birth: 1929/12/03  Today's Date: 10/02/2013 Time: 1450-1520 Time Calculation (min): 30 min   Skilled Therapeutic Interventions/Progress Updates:    Sliding board transfer w/c >mat with +1 total assist, mat>w/c and w/c>bed +2 assist pt contribution ~10%. Pt still requires min cues for being able to self direct w/c set-up. Pt reports feeling a little "off" today, feeling more "hot" than normal. BP 122/65, HR 80, SpO2 98% on RA. Pt able to participate in therapy. Seated static balance progressing to anterior/posterior and lateral weight shift with controled return to midline, min assist for static balance mod/max for dynamic with small zone of stability. Pt requested return to bed over lunch, sit>supine +2 total assist. Positioned for comfort.   Discussed decreasing therapy time to QD, pt reports he would prefer to decrease amount of therapy time he has due to overall fatigue and due to poor stamina.   Second Session Time:  1450-1520 Time Calculation (min): 30 min Skilled Therapeutic Interventions/Progress Updates:  Pt declined OOB mobility, session focused on bil LE strengthening (ankle plantarflexion/dorsiflexion) and ROM (hip/knee flexion/extension, hip internal/external rotation & abduction/adduction with hip flexed, heel cords). Pt fatigues quickly with strengthening exercises, Rt more impaired than Lt.   Rest of session addressed wife and pt questions about SNF placement, what they should consider in looking for a SNF, and how the process works. Pt and wife also educated on advocating for monitoring and questioning staff about skin integrity particularly buttocks and heels, how to cue staff for positioning to maintain good pressure relief and to request implementation of rolling program in bed if buttocks becomes erythematous.    Therapy Documentation Precautions:  Precautions Precautions:  Fall;Cervical Precaution Comments: Central cord presentation - with sitting pt has some spasms causing quick forward lean so be cautious during sitting balance activities.  Required Braces or Orthoses: Cervical Brace Cervical Brace: Hard collar;At all times Restrictions Weight Bearing Restrictions: No Pain: Pain Assessment - First Session Faces Pain Scale: Hurts little more Pain Type: Acute pain Pain Location: Shoulder Pain Orientation: Right;Left Pain Descriptors / Indicators: Aching Pain Onset: On-going Pain Intervention(s): Repositioned (elbows supported)  Pain - Second session No c/o pain  See FIM for current functional status  Therapy/Group: Individual Therapy both sessions  [place] Rocker 10/02/2013, 3:36 PM

## 2013-10-02 NOTE — Progress Notes (Signed)
Occupational Therapy Session Note  Patient Details  Name: John Carter MRN: [id number] Date of Birth: [birth year]  Today's Date: 10/02/2013 Time: 0930-1030 Time Calculation (min): 60 min  Short Term Goals: Week 3:  OT Short Term Goal 1 (Week 3): Patient will demo ability to complete 25% of self-feeding using AE with mod assist. OT Short Term Goal 2 (Week 3): Patient will maintain side-lying position in prep for slide board transfer with min assist to maintain balance OT Short Term Goal 3 (Week 3): Patient will demo ability to lift shoulder off bed during assisted bed mobility, rolling to left or right, during assisted lower body dressing  Skilled Therapeutic Interventions/Progress Updates: ADL-retraining with focus on bed mobility improved side-lying and improved UE function during assisted ADL.   Patient received supine in bed with soiled diaper from loose BM.   With RN assist to provide digital stimulation to assist patient with bowel obstruction, patient performed assisted bed rolling during assisted toileting and hygiene.    Patient requires total assist for toileting and bowel management d/t incontinence with awareness of accidents incidental to noticing odor and sensation of elimination.   Patient completed bed mobility with total assist but continues to turn his head and attempt shoulder movement as requested although lacking strength to lift either shoulder off bed during this session.   Patient only able to lift left leg up approx 1" during assisted lower body dressing, although responsive to cues.   After total assist with dressing, patient was assisted from supine to sitting at edge of bed to continue trunk righting activities and subtle leans.   Patient was able to maintain static sitting posture at edge of bed, unsupported for up to 20 seconds this session, after 6 attempts, with rest breaks and support from OT.   As patient reported fatigue and need for rest, reclined in w/c, patient  completed total assist slide board transfer (pt = 10%) with total assist to reposition in w/c for comfort.   Patient left in w/c, call light attached under hard collar, actuated by opening his mouth to force chin to press on collar.     Therapy Documentation Precautions:  Precautions Precautions: Fall;Cervical Precaution Comments: Central cord presentation - with sitting pt has some spasms causing quick forward lean so be cautious during sitting balance activities.  Required Braces or Orthoses: Cervical Brace Cervical Brace: Hard collar;At all times Restrictions Weight Bearing Restrictions: No General:   Vital Signs: Therapy Vitals BP: 142/60 mmHg  Pain:   Pain Assessment Pain Assessment: No/denies pain  ADL: ADL ADL Comments: see FIM  See FIM for current functional status  Therapy/Group: Individual Therapy  Second session: [id number] Time Calculation (min):  45 min  Pain Assessment: 3/10 right anterior shoulder  Skilled Therapeutic Interventions: Therapeutic activity with focus on bil UE strengthening.   Patient completed 6 sets of UE strengthening (AAROM), with gravity eliminated, to illicit improved wrist, forearm, biceps, triceps, shoulder (internal/external rotation, flexion/extension), and chest (pectoralis) contraction and assist with movement during ADL.   Patient requires repeated breaks due to exertional fatigue with discrete separation of movements into 3rds to foster maximum effort throughout ROM.   Left UE strength somewhat better this session during triceps extension however right UE now without evidence of biceps or finger flexor contractions (0/5) even with extremity held with gravity assisting movement.   Patient fatigued after 30 minutes and requested assist with shaving due to irritation from stubble on hard collar; OT provided total assist for shaving however  patient was able to maintain cervcial precautions throughout activity and assisted with correct  placement of tongue under his lips, when cued.   See FIM for current functional status  Therapy/Group: Individual Therapy  Kavish Lafitte 10/02/2013, 10:46 AM

## 2013-10-03 ENCOUNTER — Inpatient Hospital Stay (HOSPITAL_COMMUNITY): Payer: Medicare Other | Admitting: *Deleted

## 2013-10-03 ENCOUNTER — Encounter (HOSPITAL_COMMUNITY): Payer: Medicare Other | Admitting: *Deleted

## 2013-10-03 ENCOUNTER — Inpatient Hospital Stay (HOSPITAL_COMMUNITY): Payer: Medicare Other

## 2013-10-03 DIAGNOSIS — W19XXXA Unspecified fall, initial encounter: Secondary | ICD-10-CM

## 2013-10-03 DIAGNOSIS — S022XXA Fracture of nasal bones, initial encounter for closed fracture: Secondary | ICD-10-CM

## 2013-10-03 DIAGNOSIS — N319 Neuromuscular dysfunction of bladder, unspecified: Secondary | ICD-10-CM

## 2013-10-03 DIAGNOSIS — K592 Neurogenic bowel, not elsewhere classified: Secondary | ICD-10-CM

## 2013-10-03 DIAGNOSIS — S14121A Central cord syndrome at C1 level of cervical spinal cord, initial encounter: Secondary | ICD-10-CM

## 2013-10-03 LAB — CBC
HCT: 32.6 % — ABNORMAL LOW (ref 39.0–52.0)
HEMOGLOBIN: 11 g/dL — AB (ref 13.0–17.0)
MCH: 33.5 pg (ref 26.0–34.0)
MCHC: 33.7 g/dL (ref 30.0–36.0)
MCV: 99.4 fL (ref 78.0–100.0)
Platelets: 33 10*3/uL — ABNORMAL LOW (ref 150–400)
RBC: 3.28 MIL/uL — AB (ref 4.22–5.81)
RDW: 16.9 % — ABNORMAL HIGH (ref 11.5–15.5)
WBC: 9.5 10*3/uL (ref 4.0–10.5)

## 2013-10-03 LAB — GLUCOSE, CAPILLARY
GLUCOSE-CAPILLARY: 158 mg/dL — AB (ref 70–99)
GLUCOSE-CAPILLARY: 156 mg/dL — AB (ref 70–99)
Glucose-Capillary: 188 mg/dL — ABNORMAL HIGH (ref 70–99)
Glucose-Capillary: 176 mg/dL — ABNORMAL HIGH (ref 70–99)

## 2013-10-03 NOTE — Progress Notes (Signed)
Occupational Therapy Session Note  Patient Details  Name: John Carter MRN: [id number] Date of Birth: 1929/07/30  Today's Date: 10/03/2013 Time: 0930-1030 Time Calculation (min): 60 min  Short Term Goals: Week 3:  OT Short Term Goal 1 (Week 3): Patient will demo ability to complete 25% of self-feeding using AE with mod assist. OT Short Term Goal 2 (Week 3): Patient will maintain side-lying position in prep for slide board transfer with min assist to maintain balance OT Short Term Goal 3 (Week 3): Patient will demo ability to lift shoulder off bed during assisted bed mobility, rolling to left or right, during assisted lower body dressing  Skilled Therapeutic Interventions/Progress Updates: ADL-retraining with focus on improved static sitting balance.  Patient required total assist for lower body dressing, bed mobility, without improvement from last session but demonstrated improved static sitting balance, unsupported, this session, maintaining static balance for 2 minutes 30 seconds after setup assist to place hands at inner thighs.  During session, patient and his wife requested information on benefit of NMES relating to possible improvement with hand function.  OT educated on benefits and limitations as requested.   Following discussion, patient and wife concluded that investing time and resources on NMES was not practical giving plan to transition to SNF.    Therapy Documentation Precautions:  Precautions Precautions: Fall;Cervical Precaution Comments: Central cord presentation - with sitting pt has some spasms causing quick forward lean so be cautious during sitting balance activities.  Required Braces or Orthoses: Cervical Brace Cervical Brace: Hard collar;At all times Restrictions Weight Bearing Restrictions: No  Pain: No pain   ADL: ADL ADL Comments: see FIM  See FIM for current functional status  Therapy/Group: Individual Therapy  [place] 10/03/2013, 4:09  PM

## 2013-10-03 NOTE — Progress Notes (Addendum)
Physical Therapy Note  Patient Details  Name: MICHAELPAUL APO MRN: [id number] Date of Birth: Jan 12, 1930 Today's Date: 10/03/2013  [id number] (60 minutes) individual Pain : no complaint of pain Focus of treatment : transfer training; therapeutic activities focused on trunk control in sitting Treatment: [place] with RT; Pt in bed upon arrival ; supine to sit max assist +1 with pt assisting to place left LE off bed; transfers with sliding board- pt verbalizing the steps for transfer / total assist +2 for safety bed > wc; sitting edge of mat with therapist on ball behind pt and additional therapist in front ; rhythmic stabilization exercises for trunk flexion/extension and lateral leans; pt able to maintain static position for approximately 2 to 3 minutes X 3 with vcs for weight shifts and close SBA  ; pt tires quickly and requires extended rest break; returned to room and transferred with [place] + 2 assist  secondary to patients fatigue; bed alarm activated.    Darryn Kydd,JIM 10/03/2013, 3:15 PM

## 2013-10-03 NOTE — Progress Notes (Addendum)
Recreational Therapy Session Note  Patient Details  Name: John Carter MRN: [id number] Date of Birth: Aug 05, 1929 Today's Date: 10/03/2013  Pain: no c/o Skilled Therapeutic Interventions/Progress Updates: Session focused on activity tolerance, static sitting balance, lateral weight shifts, slide board transfers & directing care giver.  Leisure discussion during seated activities.  Pt required Total assist for slide board transfer but is able to verbally direct caregiver with min cues.  Pt participated in animal assisted activity/therapy bed level using RUE with hand over hand assist to pet the dog.   [place] 10/03/2013, 3:49 PM

## 2013-10-03 NOTE — Progress Notes (Signed)
Social Work Patient ID: John Carter, male   DOB: May 13, 1930, 80 y.o.   MRN: [id number]  Met yesterday with pt and wife following team conference.  Both aware and agree with team plan to decrease to qd tx.  Aware SW continues to pursue SNF placement for him.  Pt remains optimistic overall.  Continue to follow for d/c planning and support.  Sarinah Doetsch, LCSW

## 2013-10-03 NOTE — Patient Care Conference (Signed)
Inpatient RehabilitationTeam Conference and Plan of Care Update Date: 10/02/2013   Time: 2:00 PM    Patient Name: John Carter      Medical Record Number: [id number]  Date of Birth: 08-19-1929 Sex: Male         Room/Bed: 4W18C/4W18C-01 Payor Info: Payor: MEDICARE / Plan: MEDICARE PART A AND B / Product Type: *No Product type* /    Admitting Diagnosis: CENTRAL CORD   Admit Date/Time:  09/13/2013  4:33 PM Admission Comments: No comment available   Primary Diagnosis:  <principal problem not specified> Principal Problem: <principal problem not specified>  Patient Active Problem List   Diagnosis Date Noted  . C3 spinal cord injury 09/11/2013  . Closed fracture nasal bone 09/05/2013  . Spondylosis, cervical, with myelopathy 09/05/2013  . Central cord syndrome 08/30/2013  . Thrombocytopenia 08/30/2013  . Anemia 08/30/2013  . Fall 08/30/2013  . Knee pain, acute 08/27/2013  . Other pancytopenia 04/16/2013  . Unspecified deficiency anemia 08/06/2011  . Postoperative anemia due to acute blood loss 07/01/2011  . Hypertension   . Hyperlipidemia   . Coronary artery disease   . Myocardial infarction   . Sleep apnea   . TIA (transient ischemic attack)   . Arthritis   . Chronic back pain   . GERD (gastroesophageal reflux disease)   . Hiatal hernia   . Gastric ulcer   . Diverticulosis   . Hemorrhoids   . History of colonic polyps   . Urinary frequency   . Nocturia   . Leg cramps   . Cataracts, bilateral   . Multiple myeloma 06/18/2009    Expected Discharge Date: Expected Discharge Date:  (SNF)  Team Members Present: Physician leading conference: Dr. Alger Simons Social Worker Present: Lennart Pall, LCSW Nurse Present: Janyth Pupa, RN PT Present: Canary Brim, Rayburn Ma, PT OT Present: Chrys Racer, Starling Manns, OT;Frank Gladstone, OT PPS Coordinator present : Daiva Nakayama, RN, CRRN;Becky Alwyn Ren, PT     Current Status/Progress Goal Weekly Team Focus   Medical   plateauing, not tolerating as much although exam not really changed. problems with bowel   improve stamina and bowel mgt  see prior, manage blood counts   Bowel/Bladder   Incontinent of bowel. LBM 10/01/13. Foley cath to straight drain with yellow, clear urine  Managed bowel program  Assess for daily incontinet episodes, Monitor for s/s of UTI   Swallow/Nutrition/ Hydration             ADL's   Total assist X 1 for ADL and transfers, static sitting balance with min assist,   Max A X 1 for ADL, supervision static sitting balance  Improved UE function, improved intellectual awareness, improved communication/direction with caregivers, sitting balance, weight-bearing, transfers   Mobility   +2 total assist for bed mobility and transfers  Downgraded to +1 total assist sliding board transfer, bed mobility  NMR, sitting balance, ROM, transfers, self-directing care, pressure relief.    Communication             Safety/Cognition/ Behavioral Observations            Pain   No c/o pain  <3  Monitor for nonverbal cues of pain   Skin   Honeycomb dressing to posterior cervical incision. Sacrum, pink, blanchable  No additional skin breakdown  Assist with turn q 2hrs, Boosting q 20-30 mins in w/c, Assess skin q shift    Rehab Goals Patient on target to meet rehab goals: Yes *See Care Plan and  progress notes for long and short-term goals.  Barriers to Discharge: severity of deficits    Possible Resolutions to Barriers:  decreae therapy intensity    Discharge Planning/Teaching Needs:  Plan continues for SNF following CIR.  Pt is making progress toward 1 caregiver goals.  Still working on bowel/bladder plan for pt.      Team Discussion:  Continue to work on bowel program.  Declining tolerance overall the past few days.  Pt reports not feeling well, however, vitals stable.  Team feels cannot truly tolerate 3 hrs per day so plan to qd therapies  Revisions to Treatment Plan:  Change to  qd tx   Continued Need for Acute Rehabilitation Level of Care: The patient requires daily medical management by a physician with specialized training in physical medicine and rehabilitation for the following conditions: Daily direction of a multidisciplinary physical rehabilitation program to ensure safe treatment while eliciting the highest outcome that is of practical value to the patient.: Yes Daily medical management of patient stability for increased activity during participation in an intensive rehabilitation regime.: Yes Daily analysis of laboratory values and/or radiology reports with any subsequent need for medication adjustment of medical intervention for : Neurological problems;Other;Post surgical problems  Brendia Dampier 10/03/2013, 9:27 AM

## 2013-10-03 NOTE — Progress Notes (Signed)
Subjective/Complaints: Therapy feels that he has plateaued or regressed in some areas.  Review of Systems - Negative except weakness in all 4 limbs  Objective: Vital Signs: Blood pressure 133/65, pulse 72, temperature 97.8 F (36.6 C), temperature source Oral, resp. rate 16, height $RemoveBe'5\' 8"'doDgEJxhJ$  (1.727 m), weight 87.317 kg (192 lb 8 oz), SpO2 95.00%. No results found. Results for orders placed during the hospital encounter of 09/13/13 (from the past 72 hour(s))  GLUCOSE, CAPILLARY     Status: Abnormal   Collection Time    09/30/13 11:19 AM      Result Value Ref Range   Glucose-Capillary 159 (*) 70 - 99 mg/dL   Comment 1 Notify RN    GLUCOSE, CAPILLARY     Status: Abnormal   Collection Time    09/30/13  4:38 PM      Result Value Ref Range   Glucose-Capillary 169 (*) 70 - 99 mg/dL   Comment 1 Notify RN    GLUCOSE, CAPILLARY     Status: Abnormal   Collection Time    09/30/13  8:57 PM      Result Value Ref Range   Glucose-Capillary 206 (*) 70 - 99 mg/dL   Comment 1 Notify RN    CBC     Status: Abnormal   Collection Time    10/01/13  6:19 AM      Result Value Ref Range   WBC 10.7 (*) 4.0 - 10.5 K/uL   RBC 3.27 (*) 4.22 - 5.81 MIL/uL   Hemoglobin 10.7 (*) 13.0 - 17.0 g/dL   HCT 32.2 (*) 39.0 - 52.0 %   MCV 98.5  78.0 - 100.0 fL   MCH 32.7  26.0 - 34.0 pg   MCHC 33.2  30.0 - 36.0 g/dL   RDW 16.7 (*) 11.5 - 15.5 %   Platelets 28 (*) 150 - 400 K/uL   Comment: REPEATED TO VERIFY     CRITICAL RESULT CALLED TO, READ BACK BY AND VERIFIED WITH:     J.MUNOZ,RN [id number] 10/01/13 CLARK,S  GLUCOSE, CAPILLARY     Status: Abnormal   Collection Time    10/01/13  7:28 AM      Result Value Ref Range   Glucose-Capillary 153 (*) 70 - 99 mg/dL   Comment 1 Notify RN    GLUCOSE, CAPILLARY     Status: Abnormal   Collection Time    10/01/13 11:35 AM      Result Value Ref Range   Glucose-Capillary 157 (*) 70 - 99 mg/dL   Comment 1 Notify RN    GLUCOSE, CAPILLARY     Status: Abnormal   Collection Time    10/01/13  4:21 PM      Result Value Ref Range   Glucose-Capillary 196 (*) 70 - 99 mg/dL   Comment 1 Notify RN    GLUCOSE, CAPILLARY     Status: Abnormal   Collection Time    10/01/13  9:06 PM      Result Value Ref Range   Glucose-Capillary 160 (*) 70 - 99 mg/dL  CBC     Status: Abnormal   Collection Time    10/02/13  7:25 AM      Result Value Ref Range   WBC 11.2 (*) 4.0 - 10.5 K/uL   RBC 3.24 (*) 4.22 - 5.81 MIL/uL   Hemoglobin 10.6 (*) 13.0 - 17.0 g/dL   HCT 31.7 (*) 39.0 - 52.0 %   MCV 97.8  78.0 - 100.0 fL  MCH 32.7  26.0 - 34.0 pg   MCHC 33.4  30.0 - 36.0 g/dL   RDW 16.6 (*) 11.5 - 15.5 %   Platelets 34 (*) 150 - 400 K/uL   Comment: REPEATED TO VERIFY     CONSISTENT WITH PREVIOUS RESULT  BASIC METABOLIC PANEL     Status: Abnormal   Collection Time    10/02/13  7:25 AM      Result Value Ref Range   Sodium 135 (*) 137 - 147 mEq/L   Potassium 4.3  3.7 - 5.3 mEq/L   Chloride 99  96 - 112 mEq/L   CO2 23  19 - 32 mEq/L   Glucose, Bld 155 (*) 70 - 99 mg/dL   BUN 34 (*) 6 - 23 mg/dL   Creatinine, Ser 0.52  0.50 - 1.35 mg/dL   Calcium 8.2 (*) 8.4 - 10.5 mg/dL   GFR calc non Af Amer >90  >90 mL/min   GFR calc Af Amer >90  >90 mL/min   Comment: (NOTE)     The eGFR has been calculated using the CKD EPI equation.     This calculation has not been validated in all clinical situations.     eGFR's persistently <90 mL/min signify possible Chronic Kidney     Disease.  GLUCOSE, CAPILLARY     Status: Abnormal   Collection Time    10/02/13  7:30 AM      Result Value Ref Range   Glucose-Capillary 133 (*) 70 - 99 mg/dL  GLUCOSE, CAPILLARY     Status: Abnormal   Collection Time    10/02/13 11:41 AM      Result Value Ref Range   Glucose-Capillary 101 (*) 70 - 99 mg/dL  GLUCOSE, CAPILLARY     Status: Abnormal   Collection Time    10/02/13  4:42 PM      Result Value Ref Range   Glucose-Capillary 207 (*) 70 - 99 mg/dL  GLUCOSE, CAPILLARY     Status: Abnormal   Collection Time     10/02/13  9:07 PM      Result Value Ref Range   Glucose-Capillary 170 (*) 70 - 99 mg/dL  CBC     Status: Abnormal   Collection Time    10/03/13  5:30 AM      Result Value Ref Range   WBC 9.5  4.0 - 10.5 K/uL   RBC 3.28 (*) 4.22 - 5.81 MIL/uL   Hemoglobin 11.0 (*) 13.0 - 17.0 g/dL   HCT 32.6 (*) 39.0 - 52.0 %   MCV 99.4  78.0 - 100.0 fL   MCH 33.5  26.0 - 34.0 pg   MCHC 33.7  30.0 - 36.0 g/dL   RDW 16.9 (*) 11.5 - 15.5 %   Platelets 33 (*) 150 - 400 K/uL   Comment: CONSISTENT WITH PREVIOUS RESULT  GLUCOSE, CAPILLARY     Status: Abnormal   Collection Time    10/03/13  7:32 AM      Result Value Ref Range   Glucose-Capillary 158 (*) 70 - 99 mg/dL      Constitutional: He is oriented to person, place, and time.  HENT: tongue midline, Multiple bruises to the face and forehead  Eyes: EOM are normal.  Neck:  Cervical collar in place  Cardiovascular: Normal rate and regular rhythm.  Respiratory: Effort normal and breath sounds normal. No respiratory distress.  GI: Soft. Bowel sounds are normal. He exhibits no distension.  Neurological: He is alert and  oriented to person, place, and time.  Skin: bruising over face Left scalp staples intact motor strength is tr/5 in the right deltoid, 0 bicep, 1 tricep, 0 grip, WE trace  Motor strength is trace in the finger flexors on the left otherwise 0 in the deltoid bicep triceps  Right lower extremity 1+  minus hip knee extensor synergy 0/5 at the ankle dorsiflexor plantar flexors  Left lower extremity 2 minus hip flexor 3 minus knee extensor 1+ to 2 minus plantar flexor  Sensory intact to light touch in the right upper and left upper extremity (1/2), sensation slightly better in the LE's but inconsistent  Still no resting tone.   Absent to light touch right L5 and right S1 dermatomes  Intact to light touch left lower extremity    Assessment/Plan: 1. Functional deficits secondary to spastic tetraplegia central cord which require 3+ hours per  day of interdisciplinary therapy in a comprehensive inpatient rehab setting. Physiatrist is providing close team supervision and 24 hour management of active medical problems listed below. Physiatrist and rehab team continue to assess barriers to discharge/monitor patient progress toward functional and medical goals.  Needs WHO"s onat night   FIM: FIM - Bathing Bathing Steps Patient Completed:  (receives night baths with nursing) Bathing: 1: Total-Patient completes 0-2 of 10 parts or less than 25%  FIM - Upper Body Dressing/Undressing Upper body dressing/undressing steps patient completed:  (patient elected not to change into street clothing today) Upper body dressing/undressing: 1: Total-Patient completed less than 25% of tasks FIM - Lower Body Dressing/Undressing Lower body dressing/undressing: 1: Total-Patient completed less than 25% of tasks  FIM - Toileting Toileting: 1: Total-Patient completed zero steps, helper did all 3  FIM - Air cabin crew Transfers: 0-Activity did not occur  FIM - Control and instrumentation engineer Devices: Sliding board Bed/Chair Transfer: 1: Two helpers  FIM - Locomotion: Wheelchair Locomotion: Wheelchair: 1: Travels less than 50 ft with moderate assistance (Pt: 50 - 74%) FIM - Locomotion: Ambulation Locomotion: Ambulation:  (unsafe to attempt)  Comprehension Comprehension Mode: Auditory Comprehension: 5-Understands complex 90% of the time/Cues < 10% of the time  Expression Expression Mode: Verbal Expression: 6-Expresses complex ideas: With extra time/assistive device  Social Interaction Social Interaction: 6-Interacts appropriately with others with medication or extra time (anti-anxiety, antidepressant).  Problem Solving Problem Solving: 6-Solves complex problems: With extra time  Memory Memory: 6-More than reasonable amt of time  Medical Problem List and Plan:  1. Central cord syndrome with neurogenic bowel and  bladder. Status post C2-C4 fusion  2. DVT Prophylaxis/Anticoagulation: SCDs.  Dopplers negative, no enoxaparin low plt 3. Pain Management/chronic back pain , Hydrocodone and Robaxin as needed. Monitor with increased mobility  4. Neuropsych: This patient is capable of making decisions on his own behalf.  5. History of CAD with angioplasty. No chest pain or shortness of breath. Hold plavix for now given platelet count 6. History of CVA. Plavix currently on hold discuss resuming neurosurgery soon 7. Diabetes mellitus with peripheral neuropathy. Monitor blood sugars closely while on Decadron taper. Lantus insulin  -titrated to 33 u bid  -fair control at present 8. Hypertension. Lasix 20 mg daily (held), Avapro 75 mg daily, Toprol 50 mg daily, Norvasc 5 mg daily. Monitor with increased mobility  9. Hyperlipidemia. Zocor  10. History of multiple myeloma. I spoke to Dr. Alvy Bimler ---no plans on resuming revlimid  -feels that changes in counts are reactive to stress/illness/steroids as opposed to MM  -morganella UTI---sens to cipro  -  WBC's down to 9.5k.Marland Kitchen Platelets 28-30k,   -continue daily CBC and observation clinically  11. Renal:   -Lasix resumed low dose  -bmet stable  -encourage pO 12.  Insomnia: trial restoril  LOS (Days) 20 A FACE TO FACE EVALUATION WAS PERFORMED  John Carter 10/03/2013, 8:21 AM

## 2013-10-04 ENCOUNTER — Encounter (HOSPITAL_COMMUNITY): Payer: Medicare Other | Admitting: *Deleted

## 2013-10-04 ENCOUNTER — Inpatient Hospital Stay (HOSPITAL_COMMUNITY): Payer: Medicare Other | Admitting: Physical Therapy

## 2013-10-04 ENCOUNTER — Inpatient Hospital Stay (HOSPITAL_COMMUNITY): Payer: Medicare Other | Admitting: Occupational Therapy

## 2013-10-04 LAB — GLUCOSE, CAPILLARY
Glucose-Capillary: 146 mg/dL — ABNORMAL HIGH (ref 70–99)
Glucose-Capillary: 130 mg/dL — ABNORMAL HIGH (ref 70–99)
Glucose-Capillary: 153 mg/dL — ABNORMAL HIGH (ref 70–99)
Glucose-Capillary: 235 mg/dL — ABNORMAL HIGH (ref 70–99)
Glucose-Capillary: 165 mg/dL — ABNORMAL HIGH (ref 70–99)

## 2013-10-04 LAB — CBC
HEMATOCRIT: 32.6 % — AB (ref 39.0–52.0)
Hemoglobin: 11 g/dL — ABNORMAL LOW (ref 13.0–17.0)
MCH: 33.3 pg (ref 26.0–34.0)
MCHC: 33.7 g/dL (ref 30.0–36.0)
MCV: 98.8 fL (ref 78.0–100.0)
PLATELETS: 34 10*3/uL — AB (ref 150–400)
RBC: 3.3 MIL/uL — ABNORMAL LOW (ref 4.22–5.81)
RDW: 17.1 % — AB (ref 11.5–15.5)
WBC: 9.9 10*3/uL (ref 4.0–10.5)

## 2013-10-04 NOTE — Progress Notes (Signed)
Physical Therapy Session Note  Patient Details  Name: John Carter MRN: [id number] Date of Birth: [birth year]  Today's Date: 10/04/2013 Time: 1310-1400 Time Calculation (min): 50 min  Short Term Goals: Week 1:  PT Short Term Goal 1 (Week 1): Pt will transfer bed<> wheelchair total assist +2 (pt = 15%) PT Short Term Goal 1 - Progress (Week 1): Not met PT Short Term Goal 2 (Week 1): Pt will maintain static sitting balance x 1 min with mod assist PT Short Term Goal 2 - Progress (Week 1): Met PT Short Term Goal 3 (Week 1): Pt will direct w/c and sliding board set-up with mod verbal cues.  PT Short Term Goal 3 - Progress (Week 1): Met  Skilled Therapeutic Interventions/Progress Updates:    Pt missed first 10 min due to finishing eating lunch. Sliding board transfer w/c<>mat, and w/c>bed performed with +2 total assist. Pt self-directing w/c set-up and board placement with min/mod cues. Sitting balance edge of mat with min/mod assist bil elbows supported with pillows. Progressed to bil kicking ball (very small forces through LEs however Lt>Rt). Pt incontinent of stools and returned to room for clean up. Rest of session focused on rolling and maximizing contributing musculature, +2 total assist. Pt left in room with wife.   Therapy Documentation Precautions:  Precautions Precautions: Fall;Cervical Precaution Comments: Central cord presentation - with sitting pt has some spasms causing quick forward lean so be cautious during sitting balance activities.  Required Braces or Orthoses: Cervical Brace Cervical Brace: Hard collar;At all times Restrictions Weight Bearing Restrictions: No Pain:  "soreness" in area of subluxations - Rt>Lt.  See FIM for current functional status  Therapy/Group: Individual Therapy  Lahoma Rocker 10/04/2013, 2:10 PM

## 2013-10-04 NOTE — Progress Notes (Signed)
Occupational Therapy Session Note  Patient Details  Name: John Carter MRN: [id number] Date of Birth: 1929/08/25  Today's Date: 10/04/2013 Time: 0930-1030 Time Calculation (min): 60 min  Short Term Goals: Week 3:  OT Short Term Goal 1 (Week 3): Patient will demo ability to complete 25% of self-feeding using AE with mod assist. OT Short Term Goal 2 (Week 3): Patient will maintain side-lying position in prep for slide board transfer with min assist to maintain balance OT Short Term Goal 3 (Week 3): Patient will demo ability to lift shoulder off bed during assisted bed mobility, rolling to left or right, during assisted lower body dressing  Skilled Therapeutic Interventions/Progress Updates: ADL-retraining with focus on improved UE function during assisted bed mobility and dressing, static sitting balance, weight-shifting, and strengthening.   Patient received supine in bed, requiring total assist to don diaper and pants.   Patient demo's excellent attention and memory and is now able to anticipate steps to dressing as well as 100% of slide board transfer.   Patient demo'd ability to perform partial left lateral lean while sitting unsupported at edge of bed as well as righting reaction to correct mild loss of sitting balance anterior.   With 2+ assist, patient completed simulation of ladder climbing at level of his head with bil UE to facilitate improve shoulder flex/ext and elbow flex/ext.   Patient continued AAROM for wrist/elbow/shoulder and was returned to room at end of session; call light pad placed between cervical brace and sternum.     Therapy Documentation Precautions:  Precautions Precautions: Fall;Cervical Precaution Comments: Central cord presentation - with sitting pt has some spasms causing quick forward lean so be cautious during sitting balance activities.  Required Braces or Orthoses: Cervical Brace Cervical Brace: Hard collar;At all times Restrictions Weight Bearing  Restrictions: No  Pain: Mild at right shoulder   ADL: ADL ADL Comments: see FIM  See FIM for current functional status  Therapy/Group: Individual Therapy  [name] 10/04/2013, 10:36 AM

## 2013-10-04 NOTE — Progress Notes (Signed)
Subjective/Complaints: No complaints. Wore WHO's last night  Review of Systems - Negative except weakness in all 4 limbs  Objective: Vital Signs: Blood pressure 129/73, pulse 76, temperature 97 F (36.1 C), temperature source Oral, resp. rate 17, height $RemoveBe'5\' 8"'frCECVnqa$  (1.727 m), weight 87.317 kg (192 lb 8 oz), SpO2 95.00%. No results found. Results for orders placed during the hospital encounter of 09/13/13 (from the past 72 hour(s))  GLUCOSE, CAPILLARY     Status: Abnormal   Collection Time    10/01/13 11:35 AM      Result Value Ref Range   Glucose-Capillary 157 (*) 70 - 99 mg/dL   Comment 1 Notify RN    GLUCOSE, CAPILLARY     Status: Abnormal   Collection Time    10/01/13  4:21 PM      Result Value Ref Range   Glucose-Capillary 196 (*) 70 - 99 mg/dL   Comment 1 Notify RN    GLUCOSE, CAPILLARY     Status: Abnormal   Collection Time    10/01/13  9:06 PM      Result Value Ref Range   Glucose-Capillary 160 (*) 70 - 99 mg/dL  CBC     Status: Abnormal   Collection Time    10/02/13  7:25 AM      Result Value Ref Range   WBC 11.2 (*) 4.0 - 10.5 K/uL   RBC 3.24 (*) 4.22 - 5.81 MIL/uL   Hemoglobin 10.6 (*) 13.0 - 17.0 g/dL   HCT 31.7 (*) 39.0 - 52.0 %   MCV 97.8  78.0 - 100.0 fL   MCH 32.7  26.0 - 34.0 pg   MCHC 33.4  30.0 - 36.0 g/dL   RDW 16.6 (*) 11.5 - 15.5 %   Platelets 34 (*) 150 - 400 K/uL   Comment: REPEATED TO VERIFY     CONSISTENT WITH PREVIOUS RESULT  BASIC METABOLIC PANEL     Status: Abnormal   Collection Time    10/02/13  7:25 AM      Result Value Ref Range   Sodium 135 (*) 137 - 147 mEq/L   Potassium 4.3  3.7 - 5.3 mEq/L   Chloride 99  96 - 112 mEq/L   CO2 23  19 - 32 mEq/L   Glucose, Bld 155 (*) 70 - 99 mg/dL   BUN 34 (*) 6 - 23 mg/dL   Creatinine, Ser 0.52  0.50 - 1.35 mg/dL   Calcium 8.2 (*) 8.4 - 10.5 mg/dL   GFR calc non Af Amer >90  >90 mL/min   GFR calc Af Amer >90  >90 mL/min   Comment: (NOTE)     The eGFR has been calculated using the CKD EPI equation.      This calculation has not been validated in all clinical situations.     eGFR's persistently <90 mL/min signify possible Chronic Kidney     Disease.  GLUCOSE, CAPILLARY     Status: Abnormal   Collection Time    10/02/13  7:30 AM      Result Value Ref Range   Glucose-Capillary 133 (*) 70 - 99 mg/dL  GLUCOSE, CAPILLARY     Status: Abnormal   Collection Time    10/02/13 11:41 AM      Result Value Ref Range   Glucose-Capillary 101 (*) 70 - 99 mg/dL  GLUCOSE, CAPILLARY     Status: Abnormal   Collection Time    10/02/13  4:42 PM      Result Value  Ref Range   Glucose-Capillary 207 (*) 70 - 99 mg/dL  GLUCOSE, CAPILLARY     Status: Abnormal   Collection Time    10/02/13  9:07 PM      Result Value Ref Range   Glucose-Capillary 170 (*) 70 - 99 mg/dL  CBC     Status: Abnormal   Collection Time    10/03/13  5:30 AM      Result Value Ref Range   WBC 9.5  4.0 - 10.5 K/uL   RBC 3.28 (*) 4.22 - 5.81 MIL/uL   Hemoglobin 11.0 (*) 13.0 - 17.0 g/dL   HCT 32.6 (*) 39.0 - 52.0 %   MCV 99.4  78.0 - 100.0 fL   MCH 33.5  26.0 - 34.0 pg   MCHC 33.7  30.0 - 36.0 g/dL   RDW 16.9 (*) 11.5 - 15.5 %   Platelets 33 (*) 150 - 400 K/uL   Comment: CONSISTENT WITH PREVIOUS RESULT  GLUCOSE, CAPILLARY     Status: Abnormal   Collection Time    10/03/13  7:32 AM      Result Value Ref Range   Glucose-Capillary 158 (*) 70 - 99 mg/dL  GLUCOSE, CAPILLARY     Status: Abnormal   Collection Time    10/03/13 11:43 AM      Result Value Ref Range   Glucose-Capillary 156 (*) 70 - 99 mg/dL   Comment 1 Notify RN    GLUCOSE, CAPILLARY     Status: Abnormal   Collection Time    10/03/13  4:35 PM      Result Value Ref Range   Glucose-Capillary 188 (*) 70 - 99 mg/dL   Comment 1 Notify RN    GLUCOSE, CAPILLARY     Status: Abnormal   Collection Time    10/03/13  8:36 PM      Result Value Ref Range   Glucose-Capillary 176 (*) 70 - 99 mg/dL  GLUCOSE, CAPILLARY     Status: Abnormal   Collection Time    10/04/13   7:21 AM      Result Value Ref Range   Glucose-Capillary 146 (*) 70 - 99 mg/dL  CBC     Status: Abnormal   Collection Time    10/04/13  7:30 AM      Result Value Ref Range   WBC 9.9  4.0 - 10.5 K/uL   RBC 3.30 (*) 4.22 - 5.81 MIL/uL   Hemoglobin 11.0 (*) 13.0 - 17.0 g/dL   HCT 32.6 (*) 39.0 - 52.0 %   MCV 98.8  78.0 - 100.0 fL   MCH 33.3  26.0 - 34.0 pg   MCHC 33.7  30.0 - 36.0 g/dL   RDW 17.1 (*) 11.5 - 15.5 %   Platelets 34 (*) 150 - 400 K/uL   Comment: SPECIMEN CHECKED FOR CLOTS     REPEATED TO VERIFY     CONSISTENT WITH PREVIOUS RESULT      Constitutional: He is oriented to person, place, and time.  HENT: tongue midline, Multiple bruises to the face and forehead  Eyes: EOM are normal.  Neck:  Cervical collar in place  Cardiovascular: Normal rate and regular rhythm.  Respiratory: Effort normal and breath sounds normal. No respiratory distress.  GI: Soft. Bowel sounds are normal. He exhibits no distension.  Neurological: He is alert and oriented to person, place, and time.  Skin: bruising over face Left scalp staples intact motor strength is tr/5 in the right deltoid, 0 bicep, 1  tricep, 0 grip, WE trace  Motor strength is trace in the finger flexors on the left otherwise 0 in the deltoid bicep triceps  Right lower extremity 1+  minus hip knee extensor synergy 0/5 at the ankle dorsiflexor plantar flexors  Left lower extremity 2 minus hip flexor 3 minus knee extensor 1+ to 2 minus plantar flexor  Sensory intact to light touch in the right upper and left upper extremity (1/2), sensation slightly better in the LE's but inconsistent  Still no resting tone.   Absent to light touch right L5 and right S1 dermatomes  Intact to light touch left lower extremity    Assessment/Plan: 1. Functional deficits secondary to spastic tetraplegia central cord which require 3+ hours per day of interdisciplinary therapy in a comprehensive inpatient rehab setting. Physiatrist is providing close  team supervision and 24 hour management of active medical problems listed below. Physiatrist and rehab team continue to assess barriers to discharge/monitor patient progress toward functional and medical goals.  Still does not have full insight into the magnitude of his injury   FIM: FIM - Bathing Bathing Steps Patient Completed:  (receives night baths with nursing) Bathing: 1: Total-Patient completes 0-2 of 10 parts or less than 25%  FIM - Upper Body Dressing/Undressing Upper body dressing/undressing steps patient completed:  (patient elected not to change into street clothing today) Upper body dressing/undressing: 1: Total-Patient completed less than 25% of tasks FIM - Lower Body Dressing/Undressing Lower body dressing/undressing: 1: Total-Patient completed less than 25% of tasks  FIM - Toileting Toileting: 1: Total-Patient completed zero steps, helper did all 3  FIM - Air cabin crew Transfers: 0-Activity did not occur  FIM - Control and instrumentation engineer Devices: Sliding board Bed/Chair Transfer: 1: Two helpers  FIM - Locomotion: Wheelchair Locomotion: Wheelchair: 1: Travels less than 50 ft with moderate assistance (Pt: 50 - 74%) FIM - Locomotion: Ambulation Locomotion: Ambulation:  (unsafe to attempt)  Comprehension Comprehension Mode: Auditory Comprehension: 5-Understands complex 90% of the time/Cues < 10% of the time  Expression Expression Mode: Verbal Expression: 6-Expresses complex ideas: With extra time/assistive device  Social Interaction Social Interaction: 6-Interacts appropriately with others with medication or extra time (anti-anxiety, antidepressant).  Problem Solving Problem Solving: 6-Solves complex problems: With extra time  Memory Memory: 6-More than reasonable amt of time  Medical Problem List and Plan:  1. 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 Status post C2-C4 fusion  2. DVT  Prophylaxis/Anticoagulation: SCDs.  Dopplers negative, no enoxaparin low plt 3. Pain Management/chronic back pain , Hydrocodone and Robaxin as needed. Monitor with increased mobility  4. Neuropsych: This patient is capable of making decisions on his own behalf.  5. History of CAD with angioplasty. No chest pain or shortness of breath. Hold plavix for now given platelet count 6. History of CVA. Plavix currently on hold discuss resuming neurosurgery soon 7. Diabetes mellitus with peripheral neuropathy. Monitor blood sugars closely while on Decadron taper. Lantus insulin  -titrated to 33 u bid  -fair control at present 8. Hypertension. Lasix 20 mg daily (held), Avapro 75 mg daily, Toprol 50 mg daily, Norvasc 5 mg daily. Monitor with increased mobility  9. Hyperlipidemia. Zocor  10. History of multiple myeloma. I spoke to Dr. Alvy Bimler ---no plans on resuming revlimid  -feels that changes in counts are reactive to stress/illness/steroids as opposed to MM  -morganella UTI---sens to cipro  -WBC's down to 9.5k.Marland Kitchen Platelets in the 30k's   -continue daily CBC and observation clinically  11.  Renal:   -Lasix resumed low dose  -bmet stable  -encourage pO 12.  Insomnia:  restoril  LOS (Days) 21 A FACE TO FACE EVALUATION WAS PERFORMED  SWARTZ,ZACHARY T 10/04/2013, 8:39 AM

## 2013-10-05 ENCOUNTER — Encounter (HOSPITAL_COMMUNITY): Payer: Medicare Other | Admitting: *Deleted

## 2013-10-05 ENCOUNTER — Inpatient Hospital Stay (HOSPITAL_COMMUNITY): Payer: Medicare Other

## 2013-10-05 ENCOUNTER — Inpatient Hospital Stay (HOSPITAL_COMMUNITY): Payer: Medicare Other | Admitting: Physical Therapy

## 2013-10-05 LAB — GLUCOSE, CAPILLARY
GLUCOSE-CAPILLARY: 145 mg/dL — AB (ref 70–99)
Glucose-Capillary: 138 mg/dL — ABNORMAL HIGH (ref 70–99)
Glucose-Capillary: 235 mg/dL — ABNORMAL HIGH (ref 70–99)
Glucose-Capillary: 197 mg/dL — ABNORMAL HIGH (ref 70–99)

## 2013-10-05 LAB — CBC
HCT: 32.9 % — ABNORMAL LOW (ref 39.0–52.0)
Hemoglobin: 11.1 g/dL — ABNORMAL LOW (ref 13.0–17.0)
MCH: 33.8 pg (ref 26.0–34.0)
MCHC: 33.7 g/dL (ref 30.0–36.0)
MCV: 100.3 fL — ABNORMAL HIGH (ref 78.0–100.0)
PLATELETS: 36 10*3/uL — AB (ref 150–400)
RBC: 3.28 MIL/uL — ABNORMAL LOW (ref 4.22–5.81)
RDW: 17.2 % — ABNORMAL HIGH (ref 11.5–15.5)
WBC: 10.2 10*3/uL (ref 4.0–10.5)

## 2013-10-05 NOTE — Progress Notes (Signed)
Subjective/Complaints: No new problems. Still with good appetite  Review of Systems - Negative except weakness in all 4 limbs  Objective: Vital Signs: Blood pressure 125/72, pulse 81, temperature 98.3 F (36.8 C), temperature source Oral, resp. rate 18, height $RemoveBe'5\' 8"'gIFYbyNvo$  (1.727 m), weight 87.317 kg (192 lb 8 oz), SpO2 100.00%. No results found. Results for orders placed during the hospital encounter of 09/13/13 (from the past 72 hour(s))  GLUCOSE, CAPILLARY     Status: Abnormal   Collection Time    10/02/13 11:41 AM      Result Value Ref Range   Glucose-Capillary 101 (*) 70 - 99 mg/dL  GLUCOSE, CAPILLARY     Status: Abnormal   Collection Time    10/02/13  4:42 PM      Result Value Ref Range   Glucose-Capillary 207 (*) 70 - 99 mg/dL  GLUCOSE, CAPILLARY     Status: Abnormal   Collection Time    10/02/13  9:07 PM      Result Value Ref Range   Glucose-Capillary 170 (*) 70 - 99 mg/dL  CBC     Status: Abnormal   Collection Time    10/03/13  5:30 AM      Result Value Ref Range   WBC 9.5  4.0 - 10.5 K/uL   RBC 3.28 (*) 4.22 - 5.81 MIL/uL   Hemoglobin 11.0 (*) 13.0 - 17.0 g/dL   HCT 32.6 (*) 39.0 - 52.0 %   MCV 99.4  78.0 - 100.0 fL   MCH 33.5  26.0 - 34.0 pg   MCHC 33.7  30.0 - 36.0 g/dL   RDW 16.9 (*) 11.5 - 15.5 %   Platelets 33 (*) 150 - 400 K/uL   Comment: CONSISTENT WITH PREVIOUS RESULT  GLUCOSE, CAPILLARY     Status: Abnormal   Collection Time    10/03/13  7:32 AM      Result Value Ref Range   Glucose-Capillary 158 (*) 70 - 99 mg/dL  GLUCOSE, CAPILLARY     Status: Abnormal   Collection Time    10/03/13 11:43 AM      Result Value Ref Range   Glucose-Capillary 156 (*) 70 - 99 mg/dL   Comment 1 Notify RN    GLUCOSE, CAPILLARY     Status: Abnormal   Collection Time    10/03/13  4:35 PM      Result Value Ref Range   Glucose-Capillary 188 (*) 70 - 99 mg/dL   Comment 1 Notify RN    GLUCOSE, CAPILLARY     Status: Abnormal   Collection Time    10/03/13  8:36 PM      Result  Value Ref Range   Glucose-Capillary 176 (*) 70 - 99 mg/dL  GLUCOSE, CAPILLARY     Status: Abnormal   Collection Time    10/04/13  7:21 AM      Result Value Ref Range   Glucose-Capillary 146 (*) 70 - 99 mg/dL  CBC     Status: Abnormal   Collection Time    10/04/13  7:30 AM      Result Value Ref Range   WBC 9.9  4.0 - 10.5 K/uL   RBC 3.30 (*) 4.22 - 5.81 MIL/uL   Hemoglobin 11.0 (*) 13.0 - 17.0 g/dL   HCT 32.6 (*) 39.0 - 52.0 %   MCV 98.8  78.0 - 100.0 fL   MCH 33.3  26.0 - 34.0 pg   MCHC 33.7  30.0 - 36.0 g/dL  RDW 17.1 (*) 11.5 - 15.5 %   Platelets 34 (*) 150 - 400 K/uL   Comment: SPECIMEN CHECKED FOR CLOTS     REPEATED TO VERIFY     CONSISTENT WITH PREVIOUS RESULT  GLUCOSE, CAPILLARY     Status: Abnormal   Collection Time    10/04/13 11:29 AM      Result Value Ref Range   Glucose-Capillary 130 (*) 70 - 99 mg/dL   Comment 1 Notify RN    GLUCOSE, CAPILLARY     Status: Abnormal   Collection Time    10/04/13 12:29 PM      Result Value Ref Range   Glucose-Capillary 153 (*) 70 - 99 mg/dL   Comment 1 Notify RN    GLUCOSE, CAPILLARY     Status: Abnormal   Collection Time    10/04/13  5:05 PM      Result Value Ref Range   Glucose-Capillary 235 (*) 70 - 99 mg/dL  GLUCOSE, CAPILLARY     Status: Abnormal   Collection Time    10/04/13  8:46 PM      Result Value Ref Range   Glucose-Capillary 165 (*) 70 - 99 mg/dL   Comment 1 Notify RN    CBC     Status: Abnormal   Collection Time    10/05/13  5:32 AM      Result Value Ref Range   WBC 10.2  4.0 - 10.5 K/uL   RBC 3.28 (*) 4.22 - 5.81 MIL/uL   Hemoglobin 11.1 (*) 13.0 - 17.0 g/dL   HCT 32.9 (*) 39.0 - 52.0 %   MCV 100.3 (*) 78.0 - 100.0 fL   MCH 33.8  26.0 - 34.0 pg   MCHC 33.7  30.0 - 36.0 g/dL   RDW 17.2 (*) 11.5 - 15.5 %   Platelets 36 (*) 150 - 400 K/uL   Comment: CONSISTENT WITH PREVIOUS RESULT  GLUCOSE, CAPILLARY     Status: Abnormal   Collection Time    10/05/13  7:24 AM      Result Value Ref Range    Glucose-Capillary 138 (*) 70 - 99 mg/dL      Constitutional: He is oriented to person, place, and time.  HENT: tongue midline, Multiple bruises to the face and forehead  Eyes: EOM are normal.  Neck:  Cervical collar in place  Cardiovascular: Normal rate and regular rhythm.  Respiratory: Effort normal and breath sounds normal. No respiratory distress.  GI: Soft. Bowel sounds are normal. He exhibits no distension.  Neurological: He is alert and oriented to person, place, and time.  Skin: bruising over face Left scalp staples intact motor strength is tr/5 in the right deltoid, 0 bicep, 1 tricep, 0 grip, WE trace  Motor strength is trace in the finger flexors on the left otherwise 0 in the deltoid bicep triceps  Right lower extremity 1+  minus hip knee extensor synergy 0/5 at the ankle dorsiflexor plantar flexors  Left lower extremity 2 minus hip flexor 3 minus knee extensor 1+ to 2 minus plantar flexor  Sensory intact to light touch in the right upper and left upper extremity (1/2), sensation slightly better in the LE's but inconsistent  Still no resting tone.   Absent to light touch right L5 and right S1 dermatomes  Intact to light touch left lower extremity  NO CHANGE IN NEURO EXAM   Assessment/Plan: 1. Functional deficits secondary to spastic tetraplegia central cord which require 3+ hours per day of interdisciplinary therapy  in a comprehensive inpatient rehab setting. Physiatrist is providing close team supervision and 24 hour management of active medical problems listed below. Physiatrist and rehab team continue to assess barriers to discharge/monitor patient progress toward functional and medical goals.  Still does not have full insight into the magnitude of his injury*   FIM: FIM - Bathing Bathing Steps Patient Completed:  (receives night baths with nursing) Bathing: 1: Total-Patient completes 0-2 of 10 parts or less than 25%  FIM - Upper Body Dressing/Undressing Upper  body dressing/undressing steps patient completed:  (patient elected not to change into street clothing today) Upper body dressing/undressing: 1: Total-Patient completed less than 25% of tasks FIM - Lower Body Dressing/Undressing Lower body dressing/undressing: 1: Total-Patient completed less than 25% of tasks  FIM - Toileting Toileting: 1: Total-Patient completed zero steps, helper did all 3  FIM - Air cabin crew Transfers: 0-Activity did not occur  FIM - Control and instrumentation engineer Devices: Sliding board Bed/Chair Transfer: 1: Two helpers  FIM - Locomotion: Wheelchair Locomotion: Wheelchair: 1: Travels less than 50 ft with moderate assistance (Pt: 50 - 74%) FIM - Locomotion: Ambulation Locomotion: Ambulation:  (unsafe to attempt)  Comprehension Comprehension Mode: Auditory Comprehension: 5-Understands complex 90% of the time/Cues < 10% of the time  Expression Expression Mode: Verbal Expression: 6-Expresses complex ideas: With extra time/assistive device  Social Interaction Social Interaction: 6-Interacts appropriately with others with medication or extra time (anti-anxiety, antidepressant).  Problem Solving Problem Solving: 6-Solves complex problems: With extra time  Memory Memory: 6-More than reasonable amt of time  Medical Problem List and Plan:  1. 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 Status post C2-C4 fusion  2. DVT Prophylaxis/Anticoagulation: SCDs.  Dopplers negative, no enoxaparin low plt 3. Pain Management/chronic back pain , Hydrocodone and Robaxin as needed. Monitor with increased mobility  4. Neuropsych: This patient is capable of making decisions on his own behalf.  5. History of CAD with angioplasty. No chest pain or shortness of breath. Hold plavix for now given platelet count 6. History of CVA. Plavix currently on hold discuss resuming neurosurgery soon 7. Diabetes mellitus with peripheral neuropathy. Monitor  blood sugars closely while on Decadron taper. Lantus insulin  -titrated to 33 u bid  -fair control at present 8. Hypertension. Lasix 20 mg daily (held), Avapro 75 mg daily, Toprol 50 mg daily, Norvasc 5 mg daily. Monitor with increased mobility  9. Hyperlipidemia. Zocor  10. History of multiple myeloma. I spoke to Dr. Alvy Bimler ---no plans on resuming revlimid  -feels that changes in counts are reactive to stress/illness/steroids as opposed to MM  -morganella UTI---sens to cipro  -WBC's have improved.. Platelets seem to be climbing   -continue daily CBC and observation clinically  11. Renal:   -Lasix resumed low dose  -bmet stable  -encourage pO 12.  Insomnia:  restoril  LOS (Days) 22 A FACE TO FACE EVALUATION WAS PERFORMED  Gregg Winchell T 10/05/2013, 8:26 AM

## 2013-10-05 NOTE — Progress Notes (Signed)
Physical Therapy Note  Patient Details  Name: John Carter MRN: [id number] Date of Birth: 1929-11-11 Today's Date: 10/05/2013  Time: 1300-1353 53 minutes  1:1 no c/o pain.  Sliding board transfers w/c <> mat with pt providing 10% assist with LEs.  Seated balance static edge of mat with UEs for support up to 60 seconds before fatigue.  Multiple attempts with pt improving ability to use abs for forward trunk flexion.  Seated balance with hands in lap 3 x 45 seconds with tactile cues to contract abdominal mm.  Lateral wt shifts in sitting with pt with slight tricep activation B.  Seated ball kick 2 x 10 B with L LE improving hip flexion and knee extension strength.  Pt fatigues easily requiring frequent breaks throughout session, able to verbalize when he is tired and needs to rest.   Catlin Aycock 10/05/2013, 2:00 PM

## 2013-10-05 NOTE — Progress Notes (Signed)
Physical Therapy Weekly Progress Note  Patient Details  Name: John Carter MRN: [id number] Date of Birth: 12/22/1929  Today's Date: 10/05/2013 Time: 0900-1000 Time Calculation (min): 60 min  Patient has met 2 of 3 short term goals.  Pt is making very slow gains, he is very motivated to improve and works hard during every therapy session however unfortunately has just not had much functional return. Pt has made good gains with being able to self-direct self care and set-up for transfers, also with requesting pressure relief from staff. Due to poor tolerance and endurance pt has been decreased to QD for therapy.   Patient continues to demonstrate the following deficits: poor sitting balance, decreased ability to contribute to sliding board transfer, quadriplegia, impaired postural control and therefore will continue to benefit from skilled PT intervention to enhance overall performance with activity tolerance, balance, ability to compensate for deficits and functional use of  right upper extremity, right lower extremity, left upper extremity and left lower extremity.  Patient progressing toward long term goals. Slowly.  Plan of care revisions: long term goals downgraded.  PT Short Term Goals Week 3:  PT Short Term Goal 1 (Week 3): Pt will maintain static sitting balance without UE support x 15 sec, and 10 min with elbow support PT Short Term Goal 1 - Progress (Week 3): Not met PT Short Term Goal 2 (Week 3): Pt will self-direct steps and set up for sliding board and w/c set-up for transfer with </= 1 verbal cue.  PT Short Term Goal 2 - Progress (Week 3): Met PT Short Term Goal 3 (Week 3): Pt will be able to remind staff to tilt him in w/c for pressure relief with only min cues PT Short Term Goal 3 - Progress (Week 3): Met  Therapy Documentation Precautions:  Precautions Precautions: Fall;Cervical Precaution Comments: Central cord presentation - with sitting pt has some spasms causing  quick forward lean so be cautious during sitting balance activities.  Required Braces or Orthoses: Cervical Brace Cervical Brace: Hard collar;At all times Restrictions Weight Bearing Restrictions: No   Lahoma Rocker 10/05/2013, 2:52 PM

## 2013-10-05 NOTE — Progress Notes (Signed)
Occupational Therapy Session and Weekly Progress Note  Patient Details  Name: John Carter MRN: [id number] Date of Birth: [birth year]  Today's Date: 10/06/2013 Time: 0900-1000 Time Calculation (min): 60 min  Patient has met 2 of 3 short term goals.   Patient continues to demonstrate the following deficits: Impaired UE function, impaired dynamic dynamic sitting balance, impaired transfers, impaired anticipatory awareness and therefore will continue to benefit from skilled OT intervention to enhance overall performance with reducing care partner burden.  Patient progressing toward long term goals..  Continue plan of care.  OT Short Term Goals Week 3:  OT Short Term Goal 1 (Week 3): Patient will demo ability to complete 25% of self-feeding using AE with mod assist. OT Short Term Goal 1 - Progress (Week 3): Not progressing OT Short Term Goal 2 (Week 3): Patient will maintain side-lying position in prep for slide board transfer with min assist to maintain balance OT Short Term Goal 2 - Progress (Week 3): Met OT Short Term Goal 3 (Week 3): Patient will demo ability to lift shoulder off bed during assisted bed mobility, rolling to left or right, during assisted lower body dressing OT Short Term Goal 3 - Progress (Week 3): Met Week 4:  OT Short Term Goal 1 (Week 4): Patient will demo ability to provide verbal feedback for personall comfort and safety during mechanical lift transfers  OT Short Term Goal 2 (Week 4): Patient will maintain correct position of head and neck while supine during assisted grooming (shaving) OT Short Term Goal 3 (Week 4): Patient will demo ability to direct caregiver during assisted upper body dressing OT Short Term Goal 4 (Week 4): Patient will independently monitor and maintain weight-shifting schedule with caregivers using call light during prolonged static sitting in w/c  Skilled Therapeutic Interventions/Progress Updates: ADL-retraining with focus on improved bil  UE function during assisted ADL, improved participation in bed mobility, static balance in sitting and side-lying, and performance in assisted self-feeding.   Patient was able to demo right shoulder lift off with partial head turn to his left this session during assisted bed mobility.   After repositioning to left side-lying using right arm as stabilizer in bedrail, with only verbal cue and extra time, patient successfully retracted his right arm and recovered to supine unassisted.   During endurance and static unsupported sitting at edge of bed, patient was only able to maintain static sitting position for 55 seconds maximum this session before losing balance anterior, during 3 attempts.  While side-lying in prep for slide-board transfer patient required only min assist to maintain side-lying position.   Patient continues to fatigue during ADL session and requires repeated rest breaks to recover but he maintains high interest in participation.    Spouse has been ineffective at assisted self-feeding d/t difficulty with use of orthosis intended to stabilize patient's wrist and hold utensil.    Self-feeding goal terminated d/t poor follow-through from spouse who prefers providing total assist to insure efficient food intake.      Therapy Documentation Precautions:  Precautions Precautions: Fall;Cervical Precaution Comments: Central cord presentation - with sitting pt has some spasms causing quick forward lean so be cautious during sitting balance activities.  Required Braces or Orthoses: Cervical Brace Cervical Brace: Hard collar;At all times Restrictions Weight Bearing Restrictions: No  Vital Signs: Therapy Vitals Temp: 98.3 F (36.8 C) Temp src: Oral Pulse Rate: 96 Resp: 20 BP: 147/74 mmHg Patient Position, if appropriate: Lying Oxygen Therapy SpO2: 94 % O2 Device: None (Room  air)  Pain: No pain    ADL: ADL ADL Comments: see FIM  See FIM for current functional status  Therapy/Group:  Individual Therapy  Niam Nepomuceno 10/06/2013, 7:33 AM

## 2013-10-06 LAB — GLUCOSE, CAPILLARY
Glucose-Capillary: 143 mg/dL — ABNORMAL HIGH (ref 70–99)
Glucose-Capillary: 135 mg/dL — ABNORMAL HIGH (ref 70–99)
Glucose-Capillary: 145 mg/dL — ABNORMAL HIGH (ref 70–99)
Glucose-Capillary: 218 mg/dL — ABNORMAL HIGH (ref 70–99)

## 2013-10-06 NOTE — Progress Notes (Signed)
Subjective/Complaints: No changes in strength. Able to sleep. Has more questions about his prognosis Review of Systems - Negative except weakness in all 4 limbs  Objective: Vital Signs: Blood pressure 132/63, pulse 96, temperature 98.3 F (36.8 C), temperature source Oral, resp. rate 20, height $RemoveBe'5\' 8"'[id number]$  (1.727 m), weight 87.317 kg (192 lb 8 oz), SpO2 94.00%. No results found. Results for orders placed during the hospital encounter of 09/13/13 (from the past 72 hour(s))  GLUCOSE, CAPILLARY     Status: Abnormal   Collection Time    10/03/13 11:43 AM      Result Value Ref Range   Glucose-Capillary 156 (*) 70 - 99 mg/dL   Comment 1 Notify RN    GLUCOSE, CAPILLARY     Status: Abnormal   Collection Time    10/03/13  4:35 PM      Result Value Ref Range   Glucose-Capillary 188 (*) 70 - 99 mg/dL   Comment 1 Notify RN    GLUCOSE, CAPILLARY     Status: Abnormal   Collection Time    10/03/13  8:36 PM      Result Value Ref Range   Glucose-Capillary 176 (*) 70 - 99 mg/dL  GLUCOSE, CAPILLARY     Status: Abnormal   Collection Time    10/04/13  7:21 AM      Result Value Ref Range   Glucose-Capillary 146 (*) 70 - 99 mg/dL  CBC     Status: Abnormal   Collection Time    10/04/13  7:30 AM      Result Value Ref Range   WBC 9.9  4.0 - 10.5 K/uL   RBC 3.30 (*) 4.22 - 5.81 MIL/uL   Hemoglobin 11.0 (*) 13.0 - 17.0 g/dL   HCT 32.6 (*) 39.0 - 52.0 %   MCV 98.8  78.0 - 100.0 fL   MCH 33.3  26.0 - 34.0 pg   MCHC 33.7  30.0 - 36.0 g/dL   RDW 17.1 (*) 11.5 - 15.5 %   Platelets 34 (*) 150 - 400 K/uL   Comment: SPECIMEN CHECKED FOR CLOTS     REPEATED TO VERIFY     CONSISTENT WITH PREVIOUS RESULT  GLUCOSE, CAPILLARY     Status: Abnormal   Collection Time    10/04/13 11:29 AM      Result Value Ref Range   Glucose-Capillary 130 (*) 70 - 99 mg/dL   Comment 1 Notify RN    GLUCOSE, CAPILLARY     Status: Abnormal   Collection Time    10/04/13 12:29 PM      Result Value Ref Range   Glucose-Capillary 153  (*) 70 - 99 mg/dL   Comment 1 Notify RN    GLUCOSE, CAPILLARY     Status: Abnormal   Collection Time    10/04/13  5:05 PM      Result Value Ref Range   Glucose-Capillary 235 (*) 70 - 99 mg/dL  GLUCOSE, CAPILLARY     Status: Abnormal   Collection Time    10/04/13  8:46 PM      Result Value Ref Range   Glucose-Capillary 165 (*) 70 - 99 mg/dL   Comment 1 Notify RN    CBC     Status: Abnormal   Collection Time    10/05/13  5:32 AM      Result Value Ref Range   WBC 10.2  4.0 - 10.5 K/uL   RBC 3.28 (*) 4.22 - 5.81 MIL/uL   Hemoglobin 11.1 (*)  13.0 - 17.0 g/dL   HCT 32.9 (*) 39.0 - 52.0 %   MCV 100.3 (*) 78.0 - 100.0 fL   MCH 33.8  26.0 - 34.0 pg   MCHC 33.7  30.0 - 36.0 g/dL   RDW 17.2 (*) 11.5 - 15.5 %   Platelets 36 (*) 150 - 400 K/uL   Comment: CONSISTENT WITH PREVIOUS RESULT  GLUCOSE, CAPILLARY     Status: Abnormal   Collection Time    10/05/13  7:24 AM      Result Value Ref Range   Glucose-Capillary 138 (*) 70 - 99 mg/dL  GLUCOSE, CAPILLARY     Status: Abnormal   Collection Time    10/05/13 11:21 AM      Result Value Ref Range   Glucose-Capillary 145 (*) 70 - 99 mg/dL   Comment 1 Notify RN    GLUCOSE, CAPILLARY     Status: Abnormal   Collection Time    10/05/13  4:23 PM      Result Value Ref Range   Glucose-Capillary 235 (*) 70 - 99 mg/dL   Comment 1 Notify RN    GLUCOSE, CAPILLARY     Status: Abnormal   Collection Time    10/05/13  8:52 PM      Result Value Ref Range   Glucose-Capillary 197 (*) 70 - 99 mg/dL  GLUCOSE, CAPILLARY     Status: Abnormal   Collection Time    10/06/13  7:15 AM      Result Value Ref Range   Glucose-Capillary 143 (*) 70 - 99 mg/dL   Comment 1 Notify RN        Constitutional: He is oriented to person, place, and time.  HENT: tongue midline, Multiple bruises to the face and forehead  Eyes: EOM are normal.  Neck:  Cervical collar in place  Cardiovascular: Normal rate and regular rhythm.  Respiratory: Effort normal and breath  sounds normal. No respiratory distress.  GI: Soft. Bowel sounds are normal. He exhibits no distension.  Neurological: He is alert and oriented to person, place, and time.  Skin: bruising over face Left scalp staples intact motor strength is tr/5 in the right deltoid, 0 bicep, 1 tricep, 0 grip, WE trace  Motor strength is trace in the finger flexors on the left otherwise 0 in the deltoid bicep triceps  Right lower extremity 1+  minus hip knee extensor synergy 0/5 at the ankle dorsiflexor plantar flexors  Left lower extremity 2 minus hip flexor 3 minus knee extensor 1+ to 2 minus plantar flexor  Sensory intact to light touch in the right upper and left upper extremity (1/2), sensation slightly better in the LE's but inconsistent  Still no resting tone.   Absent to light touch right L5 and right S1 dermatomes  Intact to light touch left lower extremity  NO CHANGE IN NEURO EXAM   Assessment/Plan: 1. Functional deficits secondary to spastic tetraplegia central cord which require 3+ hours per day of interdisciplinary therapy in a comprehensive inpatient rehab setting. Physiatrist is providing close team supervision and 24 hour management of active medical problems listed below. Physiatrist and rehab team continue to assess barriers to discharge/monitor patient progress toward functional and medical goals.  Still does not have full insight into the magnitude of his injury*   FIM: FIM - Bathing Bathing Steps Patient Completed:  (receives night baths with nursing) Bathing: 1: Total-Patient completes 0-2 of 10 parts or less than 25%  FIM - Upper Body Dressing/Undressing Upper body  dressing/undressing steps patient completed:  (patient elected not to change into street clothing today) Upper body dressing/undressing: 1: Total-Patient completed less than 25% of tasks FIM - Lower Body Dressing/Undressing Lower body dressing/undressing: 1: Total-Patient completed less than 25% of tasks  FIM -  Toileting Toileting: 1: Total-Patient completed zero steps, helper did all 3  FIM - Air cabin crew Transfers: 0-Activity did not occur  FIM - Control and instrumentation engineer Devices: Sliding board Bed/Chair Transfer: 1: Two helpers  FIM - Locomotion: Wheelchair Locomotion: Wheelchair: 1: Travels less than 50 ft with moderate assistance (Pt: 50 - 74%) FIM - Locomotion: Ambulation Locomotion: Ambulation:  (unsafe to attempt)  Comprehension Comprehension Mode: Auditory Comprehension: 5-Understands complex 90% of the time/Cues < 10% of the time  Expression Expression Mode: Verbal Expression: 6-Expresses complex ideas: With extra time/assistive device  Social Interaction Social Interaction: 6-Interacts appropriately with others with medication or extra time (anti-anxiety, antidepressant).  Problem Solving Problem Solving: 6-Solves complex problems: With extra time  Memory Memory: 6-More than reasonable amt of time  Medical Problem List and Plan:  1. 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 Status post C2-C4 fusion  2. DVT Prophylaxis/Anticoagulation: SCDs.  Dopplers negative, no enoxaparin low plt 3. Pain Management/chronic back pain , Hydrocodone and Robaxin as needed. Monitor with increased mobility  4. Neuropsych: This patient is capable of making decisions on his own behalf.  5. History of CAD with angioplasty. No chest pain or shortness of breath. Hold plavix for now given platelet count 6. History of CVA. Plavix currently on hold discuss resuming neurosurgery soon 7. Diabetes mellitus with peripheral neuropathy. Monitor blood sugars closely while on Decadron taper. Lantus insulin  -titrated to 33 u bid  -fair control at present 8. Hypertension. Lasix 20 mg daily (held), Avapro 75 mg daily, Toprol 50 mg daily, Norvasc 5 mg daily. Monitor with increased mobility  9. Hyperlipidemia. Zocor  10. History of multiple myeloma. I spoke to  Dr. Alvy Bimler ---no plans on resuming revlimid  -feels that changes in counts are reactive to stress/illness/steroids as opposed to MM  -morganella UTI---sens to cipro  -WBC's have improved.. Platelets seem to be climbing   -recheck labs monday  11. Renal:   -Lasix at low dose  -bmet stable  -encourage pO 12.  Insomnia:  restoril  LOS (Days) 23 A FACE TO FACE EVALUATION WAS PERFORMED  SWARTZ,ZACHARY T 10/06/2013, 9:15 AM

## 2013-10-07 LAB — GLUCOSE, CAPILLARY
GLUCOSE-CAPILLARY: 138 mg/dL — AB (ref 70–99)
Glucose-Capillary: 129 mg/dL — ABNORMAL HIGH (ref 70–99)
Glucose-Capillary: 171 mg/dL — ABNORMAL HIGH (ref 70–99)

## 2013-10-07 NOTE — Progress Notes (Signed)
Subjective/Complaints: No new issues. Review of Systems - Negative except weakness in all 4 limbs  Objective: Vital Signs: Blood pressure 145/83, pulse 93, temperature 98 F (36.7 C), temperature source Oral, resp. rate 18, height $RemoveBe'5\' 8"'[id number]$  (1.727 m), weight 87.317 kg (192 lb 8 oz), SpO2 94.00%. No results found. Results for orders placed during the hospital encounter of 09/13/13 (from the past 72 hour(s))  GLUCOSE, CAPILLARY     Status: Abnormal   Collection Time    10/04/13 11:29 AM      Result Value Ref Range   Glucose-Capillary 130 (*) 70 - 99 mg/dL   Comment 1 Notify RN    GLUCOSE, CAPILLARY     Status: Abnormal   Collection Time    10/04/13 12:29 PM      Result Value Ref Range   Glucose-Capillary 153 (*) 70 - 99 mg/dL   Comment 1 Notify RN    GLUCOSE, CAPILLARY     Status: Abnormal   Collection Time    10/04/13  5:05 PM      Result Value Ref Range   Glucose-Capillary 235 (*) 70 - 99 mg/dL  GLUCOSE, CAPILLARY     Status: Abnormal   Collection Time    10/04/13  8:46 PM      Result Value Ref Range   Glucose-Capillary 165 (*) 70 - 99 mg/dL   Comment 1 Notify RN    CBC     Status: Abnormal   Collection Time    10/05/13  5:32 AM      Result Value Ref Range   WBC 10.2  4.0 - 10.5 K/uL   RBC 3.28 (*) 4.22 - 5.81 MIL/uL   Hemoglobin 11.1 (*) 13.0 - 17.0 g/dL   HCT 32.9 (*) 39.0 - 52.0 %   MCV 100.3 (*) 78.0 - 100.0 fL   MCH 33.8  26.0 - 34.0 pg   MCHC 33.7  30.0 - 36.0 g/dL   RDW 17.2 (*) 11.5 - 15.5 %   Platelets 36 (*) 150 - 400 K/uL   Comment: CONSISTENT WITH PREVIOUS RESULT  GLUCOSE, CAPILLARY     Status: Abnormal   Collection Time    10/05/13  7:24 AM      Result Value Ref Range   Glucose-Capillary 138 (*) 70 - 99 mg/dL  GLUCOSE, CAPILLARY     Status: Abnormal   Collection Time    10/05/13 11:21 AM      Result Value Ref Range   Glucose-Capillary 145 (*) 70 - 99 mg/dL   Comment 1 Notify RN    GLUCOSE, CAPILLARY     Status: Abnormal   Collection Time   10/05/13  4:23 PM      Result Value Ref Range   Glucose-Capillary 235 (*) 70 - 99 mg/dL   Comment 1 Notify RN    GLUCOSE, CAPILLARY     Status: Abnormal   Collection Time    10/05/13  8:52 PM      Result Value Ref Range   Glucose-Capillary 197 (*) 70 - 99 mg/dL  GLUCOSE, CAPILLARY     Status: Abnormal   Collection Time    10/06/13  7:15 AM      Result Value Ref Range   Glucose-Capillary 143 (*) 70 - 99 mg/dL   Comment 1 Notify RN    GLUCOSE, CAPILLARY     Status: Abnormal   Collection Time    10/06/13 11:30 AM      Result Value Ref Range   Glucose-Capillary  135 (*) 70 - 99 mg/dL  GLUCOSE, CAPILLARY     Status: Abnormal   Collection Time    10/06/13  4:56 PM      Result Value Ref Range   Glucose-Capillary 145 (*) 70 - 99 mg/dL   Comment 1 Notify RN    GLUCOSE, CAPILLARY     Status: Abnormal   Collection Time    10/06/13  9:01 PM      Result Value Ref Range   Glucose-Capillary 218 (*) 70 - 99 mg/dL  GLUCOSE, CAPILLARY     Status: Abnormal   Collection Time    10/07/13  8:29 AM      Result Value Ref Range   Glucose-Capillary 129 (*) 70 - 99 mg/dL      Constitutional: He is oriented to person, place, and time.  HENT: tongue midline, Multiple bruises to the face and forehead  Eyes: EOM are normal.  Neck:  Cervical collar in place  Cardiovascular: Normal rate and regular rhythm.  Respiratory: Effort normal and breath sounds normal. No respiratory distress.  GI: Soft. Bowel sounds are normal. He exhibits no distension.  Neurological: He is alert and oriented to person, place, and time.  Skin: bruising over face Left scalp staples intact motor strength is tr/5 in the right deltoid, 0 bicep, 1 tricep, 0 grip, WE trace  Motor strength is trace in the finger flexors on the left otherwise 0 in the deltoid bicep triceps  Right lower extremity 1+  minus hip knee extensor synergy 0/5 at the ankle dorsiflexor plantar flexors  Left lower extremity 2 minus hip flexor 3 minus knee  extensor 1+ to 2 minus plantar flexor  Sensory intact to light touch in the right upper and left upper extremity (1/2), sensation slightly better in the LE's but inconsistent  Still no resting tone.   Absent to light touch right L5 and right S1 dermatomes  Intact to light touch left lower extremity  NO CHANGE IN NEURO EXAM   Assessment/Plan: 1. Functional deficits secondary to spastic tetraplegia central cord which require 3+ hours per day of interdisciplinary therapy in a comprehensive inpatient rehab setting. Physiatrist is providing close team supervision and 24 hour management of active medical problems listed below. Physiatrist and rehab team continue to assess barriers to discharge/monitor patient progress toward functional and medical goals.  Working toward placement   FIM: FIM - Bathing Bathing Steps Patient Completed:  (receives night baths with nursing) Bathing: 1: Total-Patient completes 0-2 of 10 parts or less than 25%  FIM - Upper Body Dressing/Undressing Upper body dressing/undressing steps patient completed:  (patient elected not to change into street clothing today) Upper body dressing/undressing: 1: Total-Patient completed less than 25% of tasks FIM - Lower Body Dressing/Undressing Lower body dressing/undressing: 1: Total-Patient completed less than 25% of tasks  FIM - Toileting Toileting: 1: Total-Patient completed zero steps, helper did all 3  FIM - Air cabin crew Transfers: 0-Activity did not occur  FIM - Control and instrumentation engineer Devices: Sliding board Bed/Chair Transfer: 1: Two helpers  FIM - Locomotion: Wheelchair Locomotion: Wheelchair: 1: Travels less than 50 ft with moderate assistance (Pt: 50 - 74%) FIM - Locomotion: Ambulation Locomotion: Ambulation:  (unsafe to attempt)  Comprehension Comprehension Mode: Auditory Comprehension: 5-Understands complex 90% of the time/Cues < 10% of the time  Expression Expression  Mode: Verbal Expression: 6-Expresses complex ideas: With extra time/assistive device  Social Interaction Social Interaction: 6-Interacts appropriately with others with medication or extra time (anti-anxiety, antidepressant).  Problem Solving Problem Solving: 6-Solves complex problems: With extra time  Memory Memory: 6-More than reasonable amt of time  Medical Problem List and Plan:  1. 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 Status post C2-C4 fusion  2. DVT Prophylaxis/Anticoagulation: SCDs.  Dopplers negative, no enoxaparin low plt 3. Pain Management/chronic back pain , Hydrocodone and Robaxin as needed. Monitor with increased mobility  4. Neuropsych: This patient is capable of making decisions on his own behalf.  5. History of CAD with angioplasty. No chest pain or shortness of breath. Hold plavix for now given platelet count 6. History of CVA. Plavix currently on hold discuss resuming neurosurgery soon 7. Diabetes mellitus with peripheral neuropathy. Monitor blood sugars closely while on Decadron taper. Lantus insulin  -titrated to 33 u bid  -fair control at present 8. Hypertension. Lasix 20 mg daily (held), Avapro 75 mg daily, Toprol 50 mg daily, Norvasc 5 mg daily. Monitor with increased mobility  9. Hyperlipidemia. Zocor  10. History of multiple myeloma. I spoke to Dr. Alvy Bimler ---no plans on resuming revlimid  -feels that changes in counts are reactive to stress/illness/steroids as opposed to MM  -morganella UTI---sens to cipro  -WBC's have improved.. Platelets seem to be climbing   -recheck labs monday  11. Renal:   -Lasix at low dose  -bmet stable  -encourage pO 12.  Neurogenic bowel: pm program with improving reliability  LOS (Days) 24 A FACE TO FACE EVALUATION WAS PERFORMED  Tomara Youngberg T 10/07/2013, 9:04 AM

## 2013-10-08 ENCOUNTER — Encounter (HOSPITAL_COMMUNITY): Payer: Medicare Other | Admitting: *Deleted

## 2013-10-08 ENCOUNTER — Inpatient Hospital Stay (HOSPITAL_COMMUNITY): Payer: Medicare Other | Admitting: Physical Therapy

## 2013-10-08 ENCOUNTER — Inpatient Hospital Stay (HOSPITAL_COMMUNITY): Payer: Medicare Other

## 2013-10-08 DIAGNOSIS — S022XXA Fracture of nasal bones, initial encounter for closed fracture: Secondary | ICD-10-CM

## 2013-10-08 DIAGNOSIS — N319 Neuromuscular dysfunction of bladder, unspecified: Secondary | ICD-10-CM

## 2013-10-08 DIAGNOSIS — S14121A Central cord syndrome at C1 level of cervical spinal cord, initial encounter: Secondary | ICD-10-CM

## 2013-10-08 DIAGNOSIS — K592 Neurogenic bowel, not elsewhere classified: Secondary | ICD-10-CM

## 2013-10-08 DIAGNOSIS — W19XXXA Unspecified fall, initial encounter: Secondary | ICD-10-CM

## 2013-10-08 LAB — BASIC METABOLIC PANEL
BUN: 34 mg/dL — ABNORMAL HIGH (ref 6–23)
CO2: 23 meq/L (ref 19–32)
CREATININE: 0.47 mg/dL — AB (ref 0.50–1.35)
Calcium: 7.9 mg/dL — ABNORMAL LOW (ref 8.4–10.5)
Chloride: 104 mEq/L (ref 96–112)
GFR calc Af Amer: >90 mL/min (ref >90)
GFR calc non Af Amer: >90 mL/min (ref >90)
GLUCOSE: 174 mg/dL — AB (ref 70–99)
Potassium: 4.3 mEq/L (ref 3.7–5.3)
Sodium: 139 mEq/L (ref 137–147)

## 2013-10-08 LAB — CBC
HEMATOCRIT: 32.9 % — AB (ref 39.0–52.0)
HEMOGLOBIN: 11.1 g/dL — AB (ref 13.0–17.0)
MCH: 34.2 pg — AB (ref 26.0–34.0)
MCHC: 33.7 g/dL (ref 30.0–36.0)
MCV: 101.2 fL — ABNORMAL HIGH (ref 78.0–100.0)
Platelets: 41 10*3/uL — ABNORMAL LOW (ref 150–400)
RBC: 3.25 MIL/uL — ABNORMAL LOW (ref 4.22–5.81)
RDW: 17.4 % — ABNORMAL HIGH (ref 11.5–15.5)
WBC: 9.9 10*3/uL (ref 4.0–10.5)

## 2013-10-08 LAB — GLUCOSE, CAPILLARY
GLUCOSE-CAPILLARY: 142 mg/dL — AB (ref 70–99)
GLUCOSE-CAPILLARY: 247 mg/dL — AB (ref 70–99)
Glucose-Capillary: 205 mg/dL — ABNORMAL HIGH (ref 70–99)
Glucose-Capillary: 179 mg/dL — ABNORMAL HIGH (ref 70–99)
Glucose-Capillary: 215 mg/dL — ABNORMAL HIGH (ref 70–99)

## 2013-10-08 MED ORDER — LOPERAMIDE HCL 2 MG PO CAPS
2.0000 mg | ORAL_CAPSULE | Freq: Four times a day (QID) | ORAL | Status: DC | PRN
  Filled 2013-10-08: qty 1

## 2013-10-08 MED ORDER — CHOLESTYRAMINE LIGHT 4 G PO PACK
4.0000 g | PACK | Freq: Two times a day (BID) | ORAL | Status: DC
  Administered 2013-10-08 – 2013-10-10 (×4): 4 g via ORAL
  Filled 2013-10-08 (×7): qty 1

## 2013-10-08 MED ORDER — DEXAMETHASONE 2 MG PO TABS
2.0000 mg | ORAL_TABLET | Freq: Two times a day (BID) | ORAL | Status: DC
  Administered 2013-10-08 – 2013-10-10 (×4): 2 mg via ORAL
  Filled 2013-10-08 (×6): qty 1

## 2013-10-08 NOTE — Progress Notes (Signed)
Occupational Therapy Session Note  Patient Details  Name: John Carter MRN: [id number] Date of Birth: Sep 13, 1929  Today's Date: 10/08/2013 Time: 1100-1200 Time Calculation (min): 60 min  Short Term Goals: Week 4:  OT Short Term Goal 1 (Week 4): Patient will demo ability to provide verbal feedback for personall comfort and safety during mechanical lift transfers  OT Short Term Goal 2 (Week 4): Patient will maintain correct position of head and neck while supine during assisted grooming (shaving) OT Short Term Goal 3 (Week 4): Patient will demo ability to direct caregiver during assisted upper body dressing OT Short Term Goal 4 (Week 4): Patient will independently monitor and maintain weight-shifting schedule with caregivers using call light during prolonged static sitting in w/c  Skilled Therapeutic Interventions/Progress Updates: ADL-retraining with focus on maintaining head position during assisted grooming, upper body dressing during supported sitting, and improved trunk rotation during supported sitting.  Patient received supine in bed and requesting assist with shaving due to stubble irritation at neck and chin.   Patient maintained head/neck position as instructed while supine and supported with pillows.   Spouse not present this session (rare) for training on assisted dressing skills.   Patient required total assist transfer to w/c using slide board with good participation, leaning and rocking forward while counting "1-2-3" in prep for transfer.   No change in UE function noted.   Patient left in w/c reclined, awaiting RN tech vitals assessment.      Therapy Documentation Precautions:  Precautions Precautions: Fall;Cervical Precaution Comments: Central cord presentation - with sitting pt has some spasms causing quick forward lean so be cautious during sitting balance activities.  Required Braces or Orthoses: Cervical Brace Cervical Brace: Hard collar;At all  times Restrictions Weight Bearing Restrictions: No  Vital Signs: Therapy Vitals Pulse Rate: 94 BP: 165/79 mmHg Patient Position, if appropriate: Lying (HOB up in bed)  Pain: Pain Assessment Pain Assessment: 0-10 Pain Score: 3  Pain Location: Shoulder Pain Orientation: Right Pain Onset: On-going Pain Intervention(s): Medication (See eMAR);Repositioned  ADL: ADL ADL Comments: see FIM  See FIM for current functional status  Therapy/Group: Individual Therapy  [place] 10/08/2013, 12:14 PM

## 2013-10-08 NOTE — Progress Notes (Signed)
Subjective/Complaints: Loose stools last night into this am Review of Systems - Negative except weakness in all 4 limbs  Objective: Vital Signs: Blood pressure 151/79, pulse 83, temperature 97.7 F (36.5 C), temperature source Oral, resp. rate 18, height 5\' 8"  (1.727 m), weight 87.317 kg (192 lb 8 oz), SpO2 95.00%. No results found. Results for orders placed during the hospital encounter of 09/13/13 (from the past 72 hour(s))  GLUCOSE, CAPILLARY     Status: Abnormal   Collection Time    10/05/13 11:21 AM      Result Value Ref Range   Glucose-Capillary 145 (*) 70 - 99 mg/dL   Comment 1 Notify RN    GLUCOSE, CAPILLARY     Status: Abnormal   Collection Time    10/05/13  4:23 PM      Result Value Ref Range   Glucose-Capillary 235 (*) 70 - 99 mg/dL   Comment 1 Notify RN    GLUCOSE, CAPILLARY     Status: Abnormal   Collection Time    10/05/13  8:52 PM      Result Value Ref Range   Glucose-Capillary 197 (*) 70 - 99 mg/dL  GLUCOSE, CAPILLARY     Status: Abnormal   Collection Time    10/06/13  7:15 AM      Result Value Ref Range   Glucose-Capillary 143 (*) 70 - 99 mg/dL   Comment 1 Notify RN    GLUCOSE, CAPILLARY     Status: Abnormal   Collection Time    10/06/13 11:30 AM      Result Value Ref Range   Glucose-Capillary 135 (*) 70 - 99 mg/dL  GLUCOSE, CAPILLARY     Status: Abnormal   Collection Time    10/06/13  4:56 PM      Result Value Ref Range   Glucose-Capillary 145 (*) 70 - 99 mg/dL   Comment 1 Notify RN    GLUCOSE, CAPILLARY     Status: Abnormal   Collection Time    10/06/13  9:01 PM      Result Value Ref Range   Glucose-Capillary 218 (*) 70 - 99 mg/dL  GLUCOSE, CAPILLARY     Status: Abnormal   Collection Time    10/07/13  8:29 AM      Result Value Ref Range   Glucose-Capillary 129 (*) 70 - 99 mg/dL  GLUCOSE, CAPILLARY     Status: Abnormal   Collection Time    10/07/13 11:20 AM      Result Value Ref Range   Glucose-Capillary 138 (*) 70 - 99 mg/dL   Comment 1  Documented in Chart    GLUCOSE, CAPILLARY     Status: Abnormal   Collection Time    10/07/13  4:44 PM      Result Value Ref Range   Glucose-Capillary 171 (*) 70 - 99 mg/dL  GLUCOSE, CAPILLARY     Status: Abnormal   Collection Time    10/07/13  8:18 PM      Result Value Ref Range   Glucose-Capillary 205 (*) 70 - 99 mg/dL   Comment 1 Notify RN    CBC     Status: Abnormal   Collection Time    10/08/13  7:25 AM      Result Value Ref Range   WBC 9.9  4.0 - 10.5 K/uL   RBC 3.25 (*) 4.22 - 5.81 MIL/uL   Hemoglobin 11.1 (*) 13.0 - 17.0 g/dL   HCT 10/10/13 (*) 65.7 - 90.3 %  MCV 101.2 (*) 78.0 - 100.0 fL   MCH 34.2 (*) 26.0 - 34.0 pg   MCHC 33.7  30.0 - 36.0 g/dL   RDW 17.4 (*) 11.5 - 15.5 %   Platelets 41 (*) 150 - 400 K/uL   Comment: CONSISTENT WITH PREVIOUS RESULT  BASIC METABOLIC PANEL     Status: Abnormal   Collection Time    10/08/13  7:25 AM      Result Value Ref Range   Sodium 139  137 - 147 mEq/L   Potassium 4.3  3.7 - 5.3 mEq/L   Chloride 104  96 - 112 mEq/L   CO2 23  19 - 32 mEq/L   Glucose, Bld 174 (*) 70 - 99 mg/dL   BUN 34 (*) 6 - 23 mg/dL   Creatinine, Ser 0.47 (*) 0.50 - 1.35 mg/dL   Calcium 7.9 (*) 8.4 - 10.5 mg/dL   GFR calc non Af Amer >90  >90 mL/min   GFR calc Af Amer >90  >90 mL/min   Comment: (NOTE)     The eGFR has been calculated using the CKD EPI equation.     This calculation has not been validated in all clinical situations.     eGFR's persistently <90 mL/min signify possible Chronic Kidney     Disease.  GLUCOSE, CAPILLARY     Status: Abnormal   Collection Time    10/08/13  7:34 AM      Result Value Ref Range   Glucose-Capillary 179 (*) 70 - 99 mg/dL   Comment 1 Notify RN        Constitutional: He is oriented to person, place, and time.  HENT: tongue midline, Multiple bruises to the face and forehead  Eyes: EOM are normal.  Neck:  Cervical collar in place  Cardiovascular: Normal rate and regular rhythm.  Respiratory: Effort normal and  breath sounds normal. No respiratory distress.  GI: Soft. Bowel sounds are normal. He exhibits no distension.  Neurological: He is alert and oriented to person, place, and time.  Skin: bruising over face Left scalp staples intact motor strength is tr/5 in the right deltoid, 0 bicep, 1 tricep, 0 grip, WE trace  Motor strength is trace in the finger flexors on the left otherwise 0 in the deltoid bicep triceps  Right lower extremity 1+  minus hip knee extensor synergy 0/5 at the ankle dorsiflexor plantar flexors  Left lower extremity 2 minus hip flexor 3 minus knee extensor 1+ to 2 minus plantar flexor  Sensory intact to light touch in the right upper and left upper extremity (1/2), sensation slightly better in the LE's but inconsistent  Still no resting tone.   Absent to light touch right L5 and right S1 dermatomes  Intact to light touch left lower extremity  NO CHANGE IN NEURO EXAM   Assessment/Plan: 1. Functional deficits secondary to spastic tetraplegia central cord which require 3+ hours per day of interdisciplinary therapy in a comprehensive inpatient rehab setting. Physiatrist is providing close team supervision and 24 hour management of active medical problems listed below. Physiatrist and rehab team continue to assess barriers to discharge/monitor patient progress toward functional and medical goals.  Working toward placement   FIM: FIM - Bathing Bathing Steps Patient Completed:  (receives night baths with nursing) Bathing: 1: Total-Patient completes 0-2 of 10 parts or less than 25%  FIM - Upper Body Dressing/Undressing Upper body dressing/undressing steps patient completed:  (patient elected not to change into street clothing today) Upper body dressing/undressing:  1: Total-Patient completed less than 25% of tasks FIM - Lower Body Dressing/Undressing Lower body dressing/undressing: 1: Total-Patient completed less than 25% of tasks  FIM - Toileting Toileting: 1: Total-Patient  completed zero steps, helper did all 3  FIM - Air cabin crew Transfers: 0-Activity did not occur  FIM - Control and instrumentation engineer Devices: Sliding board Bed/Chair Transfer: 1: Mechanical lift  FIM - Locomotion: Wheelchair Locomotion: Wheelchair: 1: Travels less than 50 ft with moderate assistance (Pt: 50 - 74%) FIM - Locomotion: Ambulation Locomotion: Ambulation:  (unsafe to attempt)  Comprehension Comprehension Mode: Auditory Comprehension: 5-Understands complex 90% of the time/Cues < 10% of the time  Expression Expression Mode: Verbal Expression: 6-Expresses complex ideas: With extra time/assistive device  Social Interaction Social Interaction: 6-Interacts appropriately with others with medication or extra time (anti-anxiety, antidepressant).  Problem Solving Problem Solving: 6-Solves complex problems: With extra time  Memory Memory: 6-More than reasonable amt of time  Medical Problem List and Plan:  1. 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 Status post C2-C4 fusion  2. DVT Prophylaxis/Anticoagulation: SCDs.  Dopplers negative, no enoxaparin low plt 3. Pain Management/chronic back pain , Hydrocodone and Robaxin as needed. Monitor with increased mobility  4. Neuropsych: This patient is capable of making decisions on his own behalf.  5. History of CAD with angioplasty. No chest pain or shortness of breath. Hold plavix for now given platelet count 6. History of CVA. Plavix currently on hold discuss resuming neurosurgery soon 7. Diabetes mellitus with peripheral neuropathy. Monitor blood sugars closely while on Decadron taper. Lantus insulin  -titrated to 33 u bid  -fair control at present 8. Hypertension. Lasix 20 mg daily (held), Avapro 75 mg daily, Toprol 50 mg daily, Norvasc 5 mg daily. Monitor with increased mobility  9. Hyperlipidemia. Zocor  10. History of multiple myeloma. I spoke to Dr. Alvy Bimler ---no plans on resuming  revlimid  -feels that changes in counts are reactive to stress/illness/steroids as opposed to MM  -morganella UTI---sens to cipro  -WBC's have improved.. Platelets seem to be climbing   -recheck labs monday  11. Renal:   -Lasix at low dose  -bmet stable  -encourage pO 12.  Neurogenic bowel: pm program with improving reliability  -add scheduled questran, prn imodium to help with loose stools---on no softeners or lax's  LOS (Days) 25 A FACE TO FACE EVALUATION WAS PERFORMED  Laneshia Pina T 10/08/2013, 8:27 AM

## 2013-10-08 NOTE — Progress Notes (Signed)
Physical Therapy Session Note  Patient Details  Name: John Carter MRN: [id number] Date of Birth: [birth year]  Today's Date: 10/08/2013 Time: 1300-1400 Time Calculation (min): 60 min  Skilled Therapeutic Interventions/Progress Updates:    Session focused on sliding board transfers, bed mobility, self-directing care, sitting balance, and activity tolerance. Pt received in tilt-in-space w/c, found to have bowel incontinence. Sliding board transfer w/c <> bed with +2 total assist, (pt= 10%). Rolling multiple reps R/L with +2 assist during cleaning and donning of brief and new pants. Supine<> sit total assist <5% contribution. Sitting balance with bil elbow support up to 2.5 min without physical assist. Sitting balance + AAROM bil knee flex/ext with foot on wash cloth to decrease friction. Pt reports compliance with pressure relief (calling nursing to tilt). Able to self-direct w/c set up and weight shifting for placement of board with min cues.   Therapy Documentation Precautions:  Precautions Precautions: Fall;Cervical Precaution Comments: Central cord presentation - with sitting pt has some spasms causing quick forward lean so be cautious during sitting balance activities.  Required Braces or Orthoses: Cervical Brace Cervical Brace: Hard collar;At all times Restrictions Weight Bearing Restrictions: No Pain: Some pain in bil shoulders, not rated. Positioned with appropriate shoulder approximation See FIM for current functional status  Therapy/Group: Individual Therapy  [place] Rocker 10/08/2013, 3:52 PM

## 2013-10-09 ENCOUNTER — Inpatient Hospital Stay (HOSPITAL_COMMUNITY): Payer: Medicare Other | Admitting: Physical Therapy

## 2013-10-09 ENCOUNTER — Inpatient Hospital Stay (HOSPITAL_COMMUNITY): Payer: Medicare Other

## 2013-10-09 ENCOUNTER — Encounter (HOSPITAL_COMMUNITY): Payer: Medicare Other | Admitting: *Deleted

## 2013-10-09 DIAGNOSIS — N319 Neuromuscular dysfunction of bladder, unspecified: Secondary | ICD-10-CM

## 2013-10-09 DIAGNOSIS — W19XXXA Unspecified fall, initial encounter: Secondary | ICD-10-CM

## 2013-10-09 DIAGNOSIS — S022XXA Fracture of nasal bones, initial encounter for closed fracture: Secondary | ICD-10-CM

## 2013-10-09 DIAGNOSIS — S14121A Central cord syndrome at C1 level of cervical spinal cord, initial encounter: Secondary | ICD-10-CM

## 2013-10-09 DIAGNOSIS — K592 Neurogenic bowel, not elsewhere classified: Secondary | ICD-10-CM

## 2013-10-09 LAB — GLUCOSE, CAPILLARY
GLUCOSE-CAPILLARY: 121 mg/dL — AB (ref 70–99)
GLUCOSE-CAPILLARY: 115 mg/dL — AB (ref 70–99)
Glucose-Capillary: 189 mg/dL — ABNORMAL HIGH (ref 70–99)

## 2013-10-09 NOTE — Progress Notes (Signed)
Occupational Therapy Session Note  Patient Details  Name: John Carter MRN: [id number] Date of Birth: [birth year]  Today's Date: 10/09/2013 Time: 1916-6060 Time Calculation (min): 60 min  Short Term Goals: Week 4:  OT Short Term Goal 1 (Week 4): Patient will demo ability to provide verbal feedback for personall comfort and safety during mechanical lift transfers  OT Short Term Goal 2 (Week 4): Patient will maintain correct position of head and neck while supine during assisted grooming (shaving) OT Short Term Goal 3 (Week 4): Patient will demo ability to direct caregiver during assisted upper body dressing OT Short Term Goal 4 (Week 4): Patient will independently monitor and maintain weight-shifting schedule with caregivers using call light during prolonged static sitting in w/c  Skilled Therapeutic Interventions/Progress Updates: ADL-retraining with focus on patient's ability to direct care and provide feedback on comfort during mech lift transfers.   Patient received supine in bed, reporting mild fatigue for unknown reason.   Patient was assessed for change in status of UE; no changes noted although shoulder pain has subsided some.   Patient performed assisted bed mobility required for lower body dressing with evidence of right LE weakness during this session.   OT then advised patient on plan to use mech lift transfer method versus slide board with need for feedback on his comfort during process.   Patient complied and could detect when sling was installed properly compared to improperly (trial provided), by stating he position was "not lined up right."   Patient was transferred to w/c and then performed 3 reps of AAROM to BUE with no observed change in strength.    Therapy Documentation Precautions:  Precautions Precautions: Fall;Cervical Precaution Comments: Central cord presentation - with sitting pt has some spasms causing quick forward lean so be cautious during sitting balance  activities.  Required Braces or Orthoses: Cervical Brace Cervical Brace: Hard collar;At all times Restrictions Weight Bearing Restrictions: No   Vital Signs: Therapy Vitals BP: 168/80 mmHg  Pain: No pain   ADL: ADL ADL Comments: see FIM  See FIM for current functional status  Therapy/Group: Individual Therapy  [place] 10/09/2013, 12:39 PM

## 2013-10-09 NOTE — Progress Notes (Signed)
Subjective/Complaints: Loose stools persistent Review of Systems - Negative except weakness in all 4 limbs  Objective: Vital Signs: Blood pressure 166/74, pulse 71, temperature 98 F (36.7 C), temperature source Oral, resp. rate 18, height $RemoveBe'5\' 8"'gCeBXNoyb$  (1.727 m), weight 87.317 kg (192 lb 8 oz), SpO2 95.00%. No results found. Results for orders placed during the hospital encounter of 09/13/13 (from the past 72 hour(s))  GLUCOSE, CAPILLARY     Status: Abnormal   Collection Time    10/06/13 11:30 AM      Result Value Ref Range   Glucose-Capillary 135 (*) 70 - 99 mg/dL  GLUCOSE, CAPILLARY     Status: Abnormal   Collection Time    10/06/13  4:56 PM      Result Value Ref Range   Glucose-Capillary 145 (*) 70 - 99 mg/dL   Comment 1 Notify RN    GLUCOSE, CAPILLARY     Status: Abnormal   Collection Time    10/06/13  9:01 PM      Result Value Ref Range   Glucose-Capillary 218 (*) 70 - 99 mg/dL  GLUCOSE, CAPILLARY     Status: Abnormal   Collection Time    10/07/13  8:29 AM      Result Value Ref Range   Glucose-Capillary 129 (*) 70 - 99 mg/dL  GLUCOSE, CAPILLARY     Status: Abnormal   Collection Time    10/07/13 11:20 AM      Result Value Ref Range   Glucose-Capillary 138 (*) 70 - 99 mg/dL   Comment 1 Documented in Chart    GLUCOSE, CAPILLARY     Status: Abnormal   Collection Time    10/07/13  4:44 PM      Result Value Ref Range   Glucose-Capillary 171 (*) 70 - 99 mg/dL  GLUCOSE, CAPILLARY     Status: Abnormal   Collection Time    10/07/13  8:18 PM      Result Value Ref Range   Glucose-Capillary 205 (*) 70 - 99 mg/dL   Comment 1 Notify RN    CBC     Status: Abnormal   Collection Time    10/08/13  7:25 AM      Result Value Ref Range   WBC 9.9  4.0 - 10.5 K/uL   RBC 3.25 (*) 4.22 - 5.81 MIL/uL   Hemoglobin 11.1 (*) 13.0 - 17.0 g/dL   HCT 32.9 (*) 39.0 - 52.0 %   MCV 101.2 (*) 78.0 - 100.0 fL   MCH 34.2 (*) 26.0 - 34.0 pg   MCHC 33.7  30.0 - 36.0 g/dL   RDW 17.4 (*) 11.5 - 15.5 %    Platelets 41 (*) 150 - 400 K/uL   Comment: CONSISTENT WITH PREVIOUS RESULT  BASIC METABOLIC PANEL     Status: Abnormal   Collection Time    10/08/13  7:25 AM      Result Value Ref Range   Sodium 139  137 - 147 mEq/L   Potassium 4.3  3.7 - 5.3 mEq/L   Chloride 104  96 - 112 mEq/L   CO2 23  19 - 32 mEq/L   Glucose, Bld 174 (*) 70 - 99 mg/dL   BUN 34 (*) 6 - 23 mg/dL   Creatinine, Ser 0.47 (*) 0.50 - 1.35 mg/dL   Calcium 7.9 (*) 8.4 - 10.5 mg/dL   GFR calc non Af Amer >90  >90 mL/min   GFR calc Af Amer >90  >90 mL/min  Comment: (NOTE)     The eGFR has been calculated using the CKD EPI equation.     This calculation has not been validated in all clinical situations.     eGFR's persistently <90 mL/min signify possible Chronic Kidney     Disease.  GLUCOSE, CAPILLARY     Status: Abnormal   Collection Time    10/08/13  7:34 AM      Result Value Ref Range   Glucose-Capillary 179 (*) 70 - 99 mg/dL   Comment 1 Notify RN    GLUCOSE, CAPILLARY     Status: Abnormal   Collection Time    10/08/13 12:05 PM      Result Value Ref Range   Glucose-Capillary 142 (*) 70 - 99 mg/dL   Comment 1 Notify RN    GLUCOSE, CAPILLARY     Status: Abnormal   Collection Time    10/08/13  4:32 PM      Result Value Ref Range   Glucose-Capillary 247 (*) 70 - 99 mg/dL   Comment 1 Notify RN    GLUCOSE, CAPILLARY     Status: Abnormal   Collection Time    10/08/13  9:30 PM      Result Value Ref Range   Glucose-Capillary 215 (*) 70 - 99 mg/dL  GLUCOSE, CAPILLARY     Status: Abnormal   Collection Time    10/09/13  7:33 AM      Result Value Ref Range   Glucose-Capillary 121 (*) 70 - 99 mg/dL      Constitutional: He is oriented to person, place, and time.  HENT: tongue midline, Multiple bruises to the face and forehead  Eyes: EOM are normal.  Neck:  Cervical collar in place  Cardiovascular: Normal rate and regular rhythm.  Respiratory: Effort normal and breath sounds normal. No respiratory distress.   GI: Soft. Bowel sounds are normal. He exhibits no distension.  Neurological: He is alert and oriented to person, place, and time.  Skin: bruising over face Left scalp staples intact motor strength is tr/5 in the right deltoid, 0 bicep, 1 tricep, 0 grip, WE trace  Motor strength is trace in the finger flexors on the left otherwise 0 in the deltoid bicep triceps  Right lower extremity 1+  minus hip knee extensor synergy 0/5 at the ankle dorsiflexor plantar flexors  Left lower extremity 2 minus hip flexor 3 minus knee extensor 1+ to 2 minus plantar flexor  Sensory intact to light touch in the right upper and left upper extremity (1/2), sensation slightly better in the LE's but inconsistent  Still no resting tone.   Absent to light touch right L5 and right S1 dermatomes  Intact to light touch left lower extremity  NO CHANGE IN NEURO EXAM   Assessment/Plan: 1. Functional deficits secondary to spastic tetraplegia central cord which require 3+ hours per day of interdisciplinary therapy in a comprehensive inpatient rehab setting. Physiatrist is providing close team supervision and 24 hour management of active medical problems listed below. Physiatrist and rehab team continue to assess barriers to discharge/monitor patient progress toward functional and medical goals.  Working toward placement still   FIM: FIM - Bathing Bathing Steps Patient Completed:  (receives night baths with nursing) Bathing: 1: Total-Patient completes 0-2 of 10 parts or less than 25%  FIM - Upper Body Dressing/Undressing Upper body dressing/undressing steps patient completed:  (patient elected not to change into street clothing today) Upper body dressing/undressing: 1: Total-Patient completed less than 25% of tasks FIM -  Lower Body Dressing/Undressing Lower body dressing/undressing: 1: Total-Patient completed less than 25% of tasks  FIM - Toileting Toileting: 1: Total-Patient completed zero steps, helper did all  3  FIM - Air cabin crew Transfers: 0-Activity did not occur  FIM - Control and instrumentation engineer Devices: Sliding board Bed/Chair Transfer: 1: Two helpers  FIM - Locomotion: Wheelchair Locomotion: Wheelchair: 1: Travels less than 50 ft with moderate assistance (Pt: 50 - 74%) FIM - Locomotion: Ambulation Locomotion: Ambulation:  (unsafe to attempt)  Comprehension Comprehension Mode: Auditory Comprehension: 5-Understands complex 90% of the time/Cues < 10% of the time  Expression Expression Mode: Verbal Expression: 6-Expresses complex ideas: With extra time/assistive device  Social Interaction Social Interaction: 6-Interacts appropriately with others with medication or extra time (anti-anxiety, antidepressant).  Problem Solving Problem Solving: 6-Solves complex problems: With extra time  Memory Memory: 6-More than reasonable amt of time  Medical Problem List and Plan:  1. 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 Status post C2-C4 fusion  2. DVT Prophylaxis/Anticoagulation: SCDs.  Dopplers negative, no enoxaparin low plt 3. Pain Management/chronic back pain , Hydrocodone and Robaxin as needed. Monitor with increased mobility  4. Neuropsych: This patient is capable of making decisions on his own behalf.  5. History of CAD with angioplasty. No chest pain or shortness of breath. Hold plavix for now given platelet count 6. History of CVA. Plavix currently on hold discuss resuming neurosurgery soon 7. Diabetes mellitus with peripheral neuropathy. Monitor blood sugars closely while on Decadron taper. Lantus insulin  -titrated to 33 u bid  -fair control at present 8. Hypertension. Lasix 20 mg daily (held), Avapro 75 mg daily, Toprol 50 mg daily, Norvasc 5 mg daily. Monitor with increased mobility  9. Hyperlipidemia. Zocor  10. History of multiple myeloma. I spoke to Dr. Alvy Bimler ---no plans on resuming revlimid  -feels that changes in counts  are reactive to stress/illness/steroids as opposed to MM  -morganella UTI---sens to cipro  -WBC's have improved.. Platelets seem to be climbing   -recheck labs monday  11. Renal:   -Lasix at low dose  -bmet stable  -encourage pO 12.  Neurogenic bowel: pm program attempted but having diarrhea now  -add scheduled questran, prn imodium to help with loose stools---on no softeners or lax's  -check c diff given persistent diarrhea  LOS (Days) 26 A FACE TO FACE EVALUATION WAS PERFORMED  SWARTZ,ZACHARY T 10/09/2013, 8:36 AM

## 2013-10-09 NOTE — Progress Notes (Signed)
Physical Therapy Note  Patient Details  Name: JAMIAN ANDUJO MRN: [id number] Date of Birth: 12-06-1929 Today's Date: 10/09/2013  Time: 830-900 30 minutes  1:1 No c/o pain.  Session focused on PROM and AAROM for B LEs.  Pt able to perform AAROM for B hip flex/ext, knee ext and ankle DF/PF.  L LE 2/5, R LE 2-/5.  Pt pleased with gains in strength in LEs, states "I wish my arms would start working".     Efrem Pitstick 10/09/2013, 8:57 AM

## 2013-10-09 NOTE — Discharge Summary (Signed)
Discharge summary job [id number]
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
## 2013-10-09 NOTE — Progress Notes (Signed)
Physical Therapy Session Note  Patient Details  Name: John Carter MRN: [id number] Date of Birth: 09/05/1929  Today's Date: 10/09/2013 Time: 2952-8413 Time Calculation (min): 56 min  Short Term Goals: Week 4:  PT Short Term Goal 1 (Week 4): = LTGS  Skilled Therapeutic Interventions/Progress Updates:    Sliding board transfers w/c <> bed and w/c <> mat with +2 total assist uphill transfers, +1 total assist for level surfaces pad utilized to assist with progression and to decrease friction. Pt able to self-direct w/c set-up and sliding board transfers with only one cue. Pt able to verbalize pressure relief (request staff tilting every 30 min in chair, roll every 2 hrs in bed unless impaired skin integrity then every 1 hr) mod I. Static sitting balance up to 3.5 min with bil elbows supported. W/c propulsion backwards with bil LEs with mod assist to reposition feet for push-off.    Therapy Documentation Precautions:  Precautions Precautions: Fall;Cervical Precaution Comments: Central cord presentation - with sitting pt has some spasms causing quick forward lean so be cautious during sitting balance activities.  Required Braces or Orthoses: Cervical Brace Cervical Brace: Hard collar;At all times Restrictions Weight Bearing Restrictions: No Pain:  "soreness" in shoulders not rated, bil UEs supported as much as possible.   See FIM for current functional status  Therapy/Group: Individual Therapy  Lahoma Rocker 10/09/2013, 2:42 PM

## 2013-10-09 NOTE — Progress Notes (Signed)
Physical Therapy Discharge Summary  Patient Details  Name: John Carter MRN: [id number] Date of Birth: 12-Sep-1929  Today's Date: 10/09/2013 Time: 6962-9528 Time Calculation (min): 56 min  Patient has met 4 of 4 long term goals due to improved activity tolerance, improved balance and ability to compensate for deficits.  Pt has made slow progression overall and goals were downgraded multiple times. Patient to discharge at a wheelchair level Total Assist.   Patient's care partner unable to provide the necessary physical assistance at discharge.  Reasons goals not met: NA  Recommendation:  Patient will benefit from ongoing skilled PT services in skilled nursing facility setting to continue to advance safe functional mobility, address ongoing impairments in quadriparesis, deconditioning, impaired sitting balance, ability to pressure relieve by self, impairments with transfers, and minimize fall risk.  Equipment: NA  Reasons for discharge: discharge from hospital  Patient/family agrees with progress made and goals achieved: Yes  PT Discharge Precautions/Restrictions Precautions Precautions: Fall;Cervical Precaution Comments: Central cord presentation - poor sitting balance Required Braces or Orthoses: Cervical Brace Cervical Brace: Hard collar;At all times Restrictions Weight Bearing Restrictions: No Vital Signs Therapy Vitals Temp: 97.5 F (36.4 C) Temp src: Oral Pulse Rate: 87 Resp: 18 BP: 147/68 mmHg Patient Position, if appropriate: Lying Oxygen Therapy SpO2: 97 % O2 Device: None (Room air)  Cognition Arousal/Alertness: Awake/alert Orientation Level: Oriented X4 Attention: Sustained Sustained Attention: Appears intact Memory: Appears intact Awareness: Appears intact Safety/Judgment: Appears intact Sensation Sensation Light Touch: Impaired Detail Light Touch Impaired Details: Impaired RLE;Impaired LLE Proprioception: Impaired Detail Proprioception Impaired  Details: Impaired RLE;Impaired LLE Additional Comments: Pt reports long h/o impaired sensation from prior back surgery - down lateral aspect of Rt. LE and top of foot. Pt reports otherwise symmetrical sensation of bil LEs however both decreased compared to baseline.  Coordination Gross Motor Movements are Fluid and Coordinated: No Fine Motor Movements are Fluid and Coordinated: No Motor  Motor Motor: Tetraplegia (UE more impaired then LEs) Motor - Skilled Clinical Observations: Some ataxic like movements of LEs and trunk in sitting balance, little to no active UE movements.   Mobility Bed Mobility Bed Mobility: Rolling Right;Rolling Left;Left Sidelying to Sit Rolling Right: 2: Max assist Rolling Left: 2: Max assist Left Sidelying to Sit: 1: +1 Total assist Transfers Transfers: Yes Lateral/Scoot Transfers: 1: +2 Total assist Lateral/Scoot Transfer Details: Manual facilitation for weight shifting Lateral/Scoot Transfer Details (indicate cue type and reason): Pt able to direct placement of sliding board, direct which direction to lean however requires assist for board placement and for trunk support while leaning.  Locomotion  Ambulation Ambulation/Gait Assistance: Not tested (comment) (pt unable to attempt safely)  Trunk/Postural Assessment  Cervical Assessment Cervical Assessment: Exceptions to [facility] Cervical AROM Overall Cervical AROM Comments: Cervical collar on at all times Thoracic Assessment Thoracic Assessment: Exceptions to [facility] Thoracic AROM Overall Thoracic AROM Comments: Kyphotic- decreased postural control through trunk Lumbar Assessment Lumbar Assessment: Exceptions to [facility] (very impaired core strength and control, improved since evaluation however) Postural Control Postural Control:  (deficits) Trunk Control: Impaired sitting trunk control Righting Reactions: Best anterior/posterior with small cone of instability Protective Responses: Impaired Postural Limitations:  Continues to demo poor sitting postural stability when back unsupported.   Balance Balance Balance Assessed: Yes Static Sitting Balance Static Sitting - Balance Support: Feet supported Static Sitting - Level of Assistance: 5: Stand by assistance Static Sitting - Comment/# of Minutes: 3.5 min with elbows supported, no physical assist.  Dynamic Sitting Balance Dynamic Sitting - Balance  Support: Feet supported Dynamic Sitting - Level of Assistance: 3: Mod assist Dynamic Sitting Balance - Compensations: small weigth shifting anterior/posterior and laterally, mod assist needed at end range and with fatigue.  Dynamic Sitting - Balance Activities: Lateral lean/weight shifting;Forward lean/weight shifting Extremity Assessment      RLE Assessment RLE Assessment: Exceptions to [facility] RLE Strength Right Hip Flexion: 2/5 Right Knee Flexion: 2/5 Right Knee Extension: 2/5 Right Ankle Dorsiflexion: 2-/5 LLE Assessment LLE Assessment: Exceptions to [facility] LLE Strength Left Hip Flexion: 2+/5 Left Knee Flexion: 2/5 Left Knee Extension: 3-/5 Left Ankle Dorsiflexion: 2/5  See FIM for current functional status  Lahoma Rocker 10/09/2013, 4:02 PM

## 2013-10-10 ENCOUNTER — Encounter (HOSPITAL_COMMUNITY): Payer: Medicare Other | Admitting: *Deleted

## 2013-10-10 DIAGNOSIS — S022XXA Fracture of nasal bones, initial encounter for closed fracture: Secondary | ICD-10-CM

## 2013-10-10 DIAGNOSIS — S14121A Central cord syndrome at C1 level of cervical spinal cord, initial encounter: Secondary | ICD-10-CM

## 2013-10-10 DIAGNOSIS — N319 Neuromuscular dysfunction of bladder, unspecified: Secondary | ICD-10-CM

## 2013-10-10 DIAGNOSIS — K592 Neurogenic bowel, not elsewhere classified: Secondary | ICD-10-CM

## 2013-10-10 DIAGNOSIS — W19XXXA Unspecified fall, initial encounter: Secondary | ICD-10-CM

## 2013-10-10 LAB — CBC
HCT: 34.6 % — ABNORMAL LOW (ref 39.0–52.0)
Hemoglobin: 11.4 g/dL — ABNORMAL LOW (ref 13.0–17.0)
MCH: 33.3 pg (ref 26.0–34.0)
MCHC: 32.9 g/dL (ref 30.0–36.0)
MCV: 101.2 fL — ABNORMAL HIGH (ref 78.0–100.0)
PLATELETS: 32 10*3/uL — AB (ref 150–400)
RBC: 3.42 MIL/uL — AB (ref 4.22–5.81)
RDW: 17.3 % — ABNORMAL HIGH (ref 11.5–15.5)
WBC: 10 10*3/uL (ref 4.0–10.5)

## 2013-10-10 LAB — BASIC METABOLIC PANEL
BUN: 23 mg/dL (ref 6–23)
CALCIUM: 8.4 mg/dL (ref 8.4–10.5)
CHLORIDE: 102 meq/L (ref 96–112)
CO2: 23 mEq/L (ref 19–32)
CREATININE: 0.42 mg/dL — AB (ref 0.50–1.35)
GFR calc Af Amer: >90 mL/min (ref >90)
GFR calc non Af Amer: >90 mL/min (ref >90)
Glucose, Bld: 89 mg/dL (ref 70–99)
Potassium: 4.8 mEq/L (ref 3.7–5.3)
SODIUM: 141 meq/L (ref 137–147)

## 2013-10-10 LAB — CLOSTRIDIUM DIFFICILE BY PCR: CDIFFPCR: NEGATIVE

## 2013-10-10 LAB — GLUCOSE, CAPILLARY
GLUCOSE-CAPILLARY: 155 mg/dL — AB (ref 70–99)
Glucose-Capillary: 87 mg/dL (ref 70–99)

## 2013-10-10 NOTE — Progress Notes (Signed)
Subjective/Complaints: Stools better. Bowel program produced results last night Review of Systems - Negative except weakness in all 4 limbs  Objective: Vital Signs: Blood pressure 171/76, pulse 76, temperature 97 F (36.1 C), temperature source Oral, resp. rate 18, height $RemoveBe'5\' 8"'[id number]$  (1.727 m), weight 87.317 kg (192 lb 8 oz), SpO2 96.00%. No results found. Results for orders placed during the hospital encounter of 09/13/13 (from the past 72 hour(s))  GLUCOSE, CAPILLARY     Status: Abnormal   Collection Time    10/07/13 11:20 AM      Result Value Ref Range   Glucose-Capillary 138 (*) 70 - 99 mg/dL   Comment 1 Documented in Chart    GLUCOSE, CAPILLARY     Status: Abnormal   Collection Time    10/07/13  4:44 PM      Result Value Ref Range   Glucose-Capillary 171 (*) 70 - 99 mg/dL  GLUCOSE, CAPILLARY     Status: Abnormal   Collection Time    10/07/13  8:18 PM      Result Value Ref Range   Glucose-Capillary 205 (*) 70 - 99 mg/dL   Comment 1 Notify RN    CBC     Status: Abnormal   Collection Time    10/08/13  7:25 AM      Result Value Ref Range   WBC 9.9  4.0 - 10.5 K/uL   RBC 3.25 (*) 4.22 - 5.81 MIL/uL   Hemoglobin 11.1 (*) 13.0 - 17.0 g/dL   HCT 32.9 (*) 39.0 - 52.0 %   MCV 101.2 (*) 78.0 - 100.0 fL   MCH 34.2 (*) 26.0 - 34.0 pg   MCHC 33.7  30.0 - 36.0 g/dL   RDW 17.4 (*) 11.5 - 15.5 %   Platelets 41 (*) 150 - 400 K/uL   Comment: CONSISTENT WITH PREVIOUS RESULT  BASIC METABOLIC PANEL     Status: Abnormal   Collection Time    10/08/13  7:25 AM      Result Value Ref Range   Sodium 139  137 - 147 mEq/L   Potassium 4.3  3.7 - 5.3 mEq/L   Chloride 104  96 - 112 mEq/L   CO2 23  19 - 32 mEq/L   Glucose, Bld 174 (*) 70 - 99 mg/dL   BUN 34 (*) 6 - 23 mg/dL   Creatinine, Ser 0.47 (*) 0.50 - 1.35 mg/dL   Calcium 7.9 (*) 8.4 - 10.5 mg/dL   GFR calc non Af Amer >90  >90 mL/min   GFR calc Af Amer >90  >90 mL/min   Comment: (NOTE)     The eGFR has been calculated using the CKD EPI  equation.     This calculation has not been validated in all clinical situations.     eGFR's persistently <90 mL/min signify possible Chronic Kidney     Disease.  GLUCOSE, CAPILLARY     Status: Abnormal   Collection Time    10/08/13  7:34 AM      Result Value Ref Range   Glucose-Capillary 179 (*) 70 - 99 mg/dL   Comment 1 Notify RN    GLUCOSE, CAPILLARY     Status: Abnormal   Collection Time    10/08/13 12:05 PM      Result Value Ref Range   Glucose-Capillary 142 (*) 70 - 99 mg/dL   Comment 1 Notify RN    GLUCOSE, CAPILLARY     Status: Abnormal   Collection Time  10/08/13  4:32 PM      Result Value Ref Range   Glucose-Capillary 247 (*) 70 - 99 mg/dL   Comment 1 Notify RN    GLUCOSE, CAPILLARY     Status: Abnormal   Collection Time    10/08/13  9:30 PM      Result Value Ref Range   Glucose-Capillary 215 (*) 70 - 99 mg/dL  GLUCOSE, CAPILLARY     Status: Abnormal   Collection Time    10/09/13  7:33 AM      Result Value Ref Range   Glucose-Capillary 121 (*) 70 - 99 mg/dL  GLUCOSE, CAPILLARY     Status: Abnormal   Collection Time    10/09/13 11:01 AM      Result Value Ref Range   Glucose-Capillary 115 (*) 70 - 99 mg/dL  GLUCOSE, CAPILLARY     Status: Abnormal   Collection Time    10/09/13  4:20 PM      Result Value Ref Range   Glucose-Capillary 189 (*) 70 - 99 mg/dL  CLOSTRIDIUM DIFFICILE BY PCR     Status: None   Collection Time    10/09/13  8:15 PM      Result Value Ref Range   C difficile by pcr NEGATIVE  NEGATIVE  GLUCOSE, CAPILLARY     Status: Abnormal   Collection Time    10/09/13  9:01 PM      Result Value Ref Range   Glucose-Capillary 155 (*) 70 - 99 mg/dL   Comment 1 Documented in Chart     Comment 2 Notify RN    CBC     Status: Abnormal   Collection Time    10/10/13  6:06 AM      Result Value Ref Range   WBC 10.0  4.0 - 10.5 K/uL   RBC 3.42 (*) 4.22 - 5.81 MIL/uL   Hemoglobin 11.4 (*) 13.0 - 17.0 g/dL   HCT 34.6 (*) 39.0 - 52.0 %   MCV 101.2 (*)  78.0 - 100.0 fL   MCH 33.3  26.0 - 34.0 pg   MCHC 32.9  30.0 - 36.0 g/dL   RDW 17.3 (*) 11.5 - 15.5 %   Platelets 32 (*) 150 - 400 K/uL   Comment: CONSISTENT WITH PREVIOUS RESULT  BASIC METABOLIC PANEL     Status: Abnormal   Collection Time    10/10/13  6:06 AM      Result Value Ref Range   Sodium 141  137 - 147 mEq/L   Potassium 4.8  3.7 - 5.3 mEq/L   Chloride 102  96 - 112 mEq/L   CO2 23  19 - 32 mEq/L   Glucose, Bld 89  70 - 99 mg/dL   BUN 23  6 - 23 mg/dL   Creatinine, Ser 0.42 (*) 0.50 - 1.35 mg/dL   Calcium 8.4  8.4 - 10.5 mg/dL   GFR calc non Af Amer >90  >90 mL/min   GFR calc Af Amer >90  >90 mL/min   Comment: (NOTE)     The eGFR has been calculated using the CKD EPI equation.     This calculation has not been validated in all clinical situations.     eGFR's persistently <90 mL/min signify possible Chronic Kidney     Disease.  GLUCOSE, CAPILLARY     Status: None   Collection Time    10/10/13  7:47 AM      Result Value Ref Range   Glucose-Capillary 87  70 - 99 mg/dL      Constitutional: He is oriented to person, place, and time.  HENT: tongue midline, Multiple bruises to the face and forehead  Eyes: EOM are normal.  Neck:  Cervical collar in place  Cardiovascular: Normal rate and regular rhythm.  Respiratory: Effort normal and breath sounds normal. No respiratory distress.  GI: Soft. Bowel sounds are normal. He exhibits no distension.  Neurological: He is alert and oriented to person, place, and time.  Skin: bruising over face Left scalp staples intact motor strength is tr/5 in the right deltoid, 0 bicep, 1 tricep, 0 grip, WE trace  Motor strength is trace in the finger flexors on the left otherwise 0 in the deltoid bicep triceps  Right lower extremity 1+  minus hip knee extensor synergy 0/5 at the ankle dorsiflexor plantar flexors  Left lower extremity 2 minus hip flexor 3 minus knee extensor 1+ to 2 minus plantar flexor  Sensory intact to light touch in the  right upper and left upper extremity (1/2), sensation slightly better in the LE's but inconsistent  Still no resting tone.   Absent to light touch right L5 and right S1 dermatomes  Intact to light touch left lower extremity  NO CHANGE IN NEURO EXAM   Assessment/Plan: 1. Functional deficits secondary to spastic tetraplegia central cord which require 3+ hours per day of interdisciplinary therapy in a comprehensive inpatient rehab setting. Physiatrist is providing close team supervision and 24 hour management of active medical problems listed below. Physiatrist and rehab team continue to assess barriers to discharge/monitor patient progress toward functional and medical goals.  Working toward placement still   FIM: FIM - Bathing Bathing Steps Patient Completed:  (receives night baths with nursing) Bathing: 1: Total-Patient completes 0-2 of 10 parts or less than 25%  FIM - Upper Body Dressing/Undressing Upper body dressing/undressing steps patient completed:  (patient elected not to change into street clothing today) Upper body dressing/undressing: 1: Total-Patient completed less than 25% of tasks FIM - Lower Body Dressing/Undressing Lower body dressing/undressing: 1: Total-Patient completed less than 25% of tasks  FIM - Toileting Toileting: 1: Total-Patient completed zero steps, helper did all 3  FIM - Archivist Transfers: 0-Activity did not occur  FIM - Banker Devices: Sliding board Bed/Chair Transfer: 1: Two helpers;1: Mechanical lift  FIM - Locomotion: Wheelchair Locomotion: Wheelchair: 1: Travels less than 50 ft with moderate assistance (Pt: 50 - 74%) FIM - Locomotion: Ambulation Ambulation/Gait Assistance: Not tested (comment) (pt unable to attempt safely) Locomotion: Ambulation:  (unsafe to attempt)  Comprehension Comprehension Mode: Auditory Comprehension: 5-Understands complex 90% of the time/Cues < 10% of the  time  Expression Expression Mode: Verbal Expression: 6-Expresses complex ideas: With extra time/assistive device  Social Interaction Social Interaction: 7-Interacts appropriately with others - No medications needed.  Problem Solving Problem Solving: 5-Solves complex 90% of the time/cues < 10% of the time  Memory Memory: 6-More than reasonable amt of time  Medical Problem List and Plan:  1. 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 Status post C2-C4 fusion  2. DVT Prophylaxis/Anticoagulation: SCDs.  Dopplers negative, no enoxaparin low plt 3. Pain Management/chronic back pain , Hydrocodone and Robaxin as needed. Monitor with increased mobility  4. Neuropsych: This patient is capable of making decisions on his own behalf.  5. History of CAD with angioplasty. No chest pain or shortness of breath. Hold plavix for now given platelet count 6. History of CVA. Plavix currently on  hold discuss resuming neurosurgery soon 7. Diabetes mellitus with peripheral neuropathy. Monitor blood sugars closely while on Decadron taper. Lantus insulin  -titrated to 33 u bid  -fair control at present 8. Hypertension. Lasix 20 mg daily (held), Avapro 75 mg daily, Toprol 50 mg daily, Norvasc 5 mg daily. Monitor with increased mobility  9. Hyperlipidemia. Zocor  10. History of multiple myeloma. I spoke to Dr. Alvy Bimler ---no plans on resuming revlimid  -feels that changes in counts are reactive to stress/illness/steroids as opposed to MM  -morganella UTI---sens to cipro  -WBC's have improved.. Platelets stable in the 30's. Will need regular check at SNF 11. Renal:   -Lasix at low dose  -bmet stable  -encourage pO 12.  Neurogenic bowel: pm program attempted but having diarrhea now  -continue scheduled questran, prn imodium to help with loose stools---on no softeners or lax's  -c diff negative  LOS (Days) 27 A FACE TO FACE EVALUATION WAS PERFORMED  SWARTZ,ZACHARY T 10/10/2013, 8:45 AM

## 2013-10-10 NOTE — Progress Notes (Signed)
Social Work Patient ID: John Carter, male   DOB: 01/06/1930, 80 y.o.   MRN: [id number]  Received SNF bed offers yesterday from [facility] and [facility].  Reviewed with both pt and wife (wife had already visited both facilities) and decided to accept be at [facility].  Informed MD/PT and tx team of plan to d/c today pending medical clearance.   Blayde Bacigalupi, LCSW

## 2013-10-10 NOTE — Patient Care Conference (Signed)
Inpatient RehabilitationTeam Conference and Plan of Care Update Date: 10/09/2013   Time: 2:00 PM    Patient Name: John Carter      Medical Record Number: [id number]  Date of Birth: 1929-11-03 Sex: Male         Room/Bed: 4W18C/4W18C-01 Payor Info: Payor: MEDICARE / Plan: MEDICARE PART A AND B / Product Type: *No Product type* /    Admitting Diagnosis: CENTRAL CORD   Admit Date/Time:  09/13/2013  4:33 PM Admission Comments: No comment available   Primary Diagnosis:  <principal problem not specified> Principal Problem: <principal problem not specified>  Patient Active Problem List   Diagnosis Date Noted  . C3 spinal cord injury 09/11/2013  . Closed fracture nasal bone 09/05/2013  . Spondylosis, cervical, with myelopathy 09/05/2013  . Central cord syndrome 08/30/2013  . Thrombocytopenia 08/30/2013  . Anemia 08/30/2013  . Fall 08/30/2013  . Knee pain, acute 08/27/2013  . Other pancytopenia 04/16/2013  . Unspecified deficiency anemia 08/06/2011  . Postoperative anemia due to acute blood loss 07/01/2011  . Hypertension   . Hyperlipidemia   . Coronary artery disease   . Myocardial infarction   . Sleep apnea   . TIA (transient ischemic attack)   . Arthritis   . Chronic back pain   . GERD (gastroesophageal reflux disease)   . Hiatal hernia   . Gastric ulcer   . Diverticulosis   . Hemorrhoids   . History of colonic polyps   . Urinary frequency   . Nocturia   . Leg cramps   . Cataracts, bilateral   . Multiple myeloma 06/18/2009    Expected Discharge Date: Expected Discharge Date:  (SNF)  Team Members Present: Physician leading conference: Dr. Alger Simons Social Worker Present: Alfonse Alpers, LCSW Nurse Present: Heather Roberts, RN PT Present: Canary Brim, Rayburn Ma, PT OT Present: Chrys Racer, Starling Manns, OT;[place], OT SLP Present: Weston Anna, SLP PPS Coordinator present : Ileana Ladd, PT     Current Status/Progress Goal Weekly  Team Focus  Medical   bowels a problem, working onregulation. continue bowel adjustments  see above  skin care, regulation of bowels   Bowel/Bladder   Bowel program qday at 1800. Incont. of bowel. foley inplace  manage bowel program from caregiver with total assist. get on regular bowel program  cont. bowel program with management of foley care   Swallow/Nutrition/ Hydration             ADL's   Total A for ADL, mech lift transfers, static sitting balance with min A  Max A X 1 for ADL, supervision static sitting balance  improved awareness of deficits and improved ability to direct total assist level of care (reducing caregiver burden)   Mobility   +2 total assist for bed mobility and transfers  +1 total assist sliding board transfers  NMR, sitting balance, ROM, sliding board transfers, self-directing care, family education, pressure relief.    Communication             Safety/Cognition/ Behavioral Observations            Pain   pt complains of pain in right shoulder. 662m tylenol given PRN q4hrs  pain managed 3 or less out ot ten  assess pain q shift and medicate as needed. observe for non verbal cues   Skin   honeycomb dressing to posterior cervical incsision. scattered brusing to exteremities, sacrum pink, and blancable  stay free of skin breakdown while on rehab unit  turn  pt q2hrs while in bed, boost/tilt while in chair. assess skin qshift and keep pt dry.    Rehab Goals Patient on target to meet rehab goals: Yes *See Care Plan and progress notes for long and short-term goals.  Barriers to Discharge: see prior    Possible Resolutions to Barriers:  supervision, continued skill care    Discharge Planning/Teaching Needs:  plan for d/c to SNF tomorrow if medically cleared      Team Discussion:  Continues to make slow gains.  SW informs bed offer made for pt tomorrow and pt/ wife both accepting.  MD ok to transfer if Cdiff is neg.  Revisions to Treatment Plan:  NA    Continued Need for Acute Rehabilitation Level of Care: The patient requires daily medical management by a physician with specialized training in physical medicine and rehabilitation for the following conditions: Daily direction of a multidisciplinary physical rehabilitation program to ensure safe treatment while eliciting the highest outcome that is of practical value to the patient.: Yes Daily medical management of patient stability for increased activity during participation in an intensive rehabilitation regime.: Yes Daily analysis of laboratory values and/or radiology reports with any subsequent need for medication adjustment of medical intervention for : Pulmonary problems;Post surgical problems;Neurological problems  John Carter 10/10/2013, 7:13 AM

## 2013-10-10 NOTE — Progress Notes (Signed)
Social Work  Discharge Note  The overall goal for the admission was met for:   Discharge location: Yes - d/c plan upon admit was for SNF  Length of Stay: Yes - 29 days  Discharge activity level: Yes - total assist overall  Home/community participation: Yes  Services provided included: MD, RD, PT, OT, RN, TR, Pharmacy, Neuropsych and SW  Financial Services: Medicare and Private Insurance: [place]  Follow-up services arranged: Other: SNF @ [place]  Comments (or additional information):  Patient/Family verbalized understanding of follow-up arrangements: Yes  Individual responsible for coordination of the follow-up plan: patient  Confirmed correct DME delivered: NA    Misha Antonini

## 2013-10-10 NOTE — Plan of Care (Signed)
Problem: SCI BOWEL ELIMINATION Goal: RH STG MANAGE BOWEL WITH ASSISTANCE STG Manage Bowel with total Assistance.  Outcome: Progressing Frequent incont. Soft-loose stools Oncology [place]

## 2013-10-10 NOTE — Plan of Care (Signed)
Problem: RH SAFETY Goal: RH STG ADHERE TO SAFETY PRECAUTIONS W/ASSISTANCE/DEVICE STG Adhere to Safety Precautions With min Assistance/Device.  Outcome: Not Progressing Pt is immobile and depends on staff for all mobility.wbb

## 2013-10-10 NOTE — Progress Notes (Signed)
Recreational Therapy Discharge Summary Patient Details  Name: John Carter MRN: [id number] Date of Birth: 09/03/1929 Today's Date: 10/10/2013  Long term goals set: 1  Long term goals met: 0   Comments on progress toward goals: Pt is currently total assist.  Pt is discharging to SNF today for continued therapies & 24 hour care as wife is unable to provide the necessary care. Reasons for discharge: discharge from hospital Patient/family agrees with progress made and goals achieved: Yes  SIMPSON,LISA 10/10/2013, 11:05 AM     

## 2013-10-10 NOTE — Plan of Care (Signed)
Problem: SCI BLADDER ELIMINATION Goal: RH STG MANAGE BLADDER WITH EQUIPMENT WITH ASSISTANCE STG Manage Bladder With Equipment With total Assistance  Outcome: Not Progressing indwelling foley catheter

## 2013-10-11 ENCOUNTER — Telehealth: Payer: Self-pay | Admitting: Physical Medicine & Rehabilitation

## 2013-10-11 ENCOUNTER — Encounter (HOSPITAL_COMMUNITY): Payer: Medicare Other | Admitting: *Deleted

## 2013-10-11 NOTE — Telephone Encounter (Signed)
Is it ok to wait over a month to see this patient?

## 2013-10-11 NOTE — Telephone Encounter (Signed)
John Carter, is from [facility], wanted to set up a follow up appointment.. I called back to set one up, but she states it was to far out and want to know if this will be all right with the doctor. He was a pt in the hospital [facility] in Feb 2015... John Carter can be reach at [phone].... Thanks

## 2013-10-11 NOTE — Progress Notes (Signed)
Occupational Therapy Discharge Summary  Patient Details  Name: John Carter MRN: [id number] Date of Birth: 1930/04/01  Today's Date: 10/11/2013  Patient has met 3 of 5 long term goals due to improved activity tolerance and improved awareness.  Patient to discharge at overall Total Assist level.  Patient's care partner requires extensive assistance to provide the necessary physical assistance at discharge; patient to be discharged to SNF.    Reasons goals not met: Inadequate reinforcement with self-feeding and grooming goals from spouse who was unable to manage consistent use orthosis or recall importance of follow-through as needed.  Recommendation:  Patient will not require ongoing skilled OT services due to discharge to SNF.   Patient would benefit from daily AROM and AAROM to prevent development of contractures at BUE.   Equipment: No equipment provided  Reasons for discharge: discharge from hospital  Patient/family agrees with progress made and goals achieved: Yes  OT Discharge Precautions/Restrictions  Precautions Precautions: Fall;Cervical Precaution Comments: Central cord presentation - poor sitting balance Required Braces or Orthoses: Cervical Brace Cervical Brace: Hard collar;At all times Restrictions Weight Bearing Restrictions: No  Pain Pain Assessment Pain Assessment: 0-10 Pain Score: 2  Pain Type: Chronic pain Pain Location: Shoulder Pain Orientation: Right Pain Descriptors / Indicators: Discomfort Pain Onset: With Activity Multiple Pain Sites: No  ADL ADL ADL Comments: see FIM  Vision/Perception  Vision - History Baseline Vision: No visual deficits Vision - Assessment Eye Alignment: Within Functional Limits Perception Perception: Within Functional Limits Praxis Praxis: Intact   Cognition Overall Cognitive Status: Within Functional Limits for tasks assessed Arousal/Alertness: Awake/alert Orientation Level: Oriented X4 Attention:  Alternating Sustained Attention: Appears intact Alternating Attention: Appears intact Memory: Appears intact Awareness: Appears intact Problem Solving: Appears intact Safety/Judgment: Appears intact  Sensation Sensation Light Touch: Impaired Detail Light Touch Impaired Details: Impaired RLE;Impaired LLE Stereognosis: Impaired Detail Stereognosis Impaired Details: Impaired RUE;Impaired LUE Hot/Cold: Appears Intact Proprioception: Appears Intact Proprioception Impaired Details: Impaired RLE;Impaired LLE Additional Comments: Pt reports long h/o impaired sensation from prior back surgery - down lateral aspect of Rt. LE and top of foot. Pt reports otherwise symmetrical sensation of bil LEs however both decreased compared to baseline.  Coordination Gross Motor Movements are Fluid and Coordinated: No Fine Motor Movements are Fluid and Coordinated: No  Motor  Motor Motor: Tetraplegia Motor - Skilled Clinical Observations: Some ataxic like movements of LEs and trunk in sitting balance, little to no active UE movements.   Mobility  Bed Mobility Bed Mobility: Rolling Right;Rolling Left;Left Sidelying to Sit Rolling Right: 2: Max assist Rolling Right Details: Manual facilitation for weight shifting;Manual facilitation for placement Rolling Left: 2: Max assist Rolling Left Details: Manual facilitation for weight shifting;Manual facilitation for placement Left Sidelying to Sit: 1: +1 Total assist Left Sidelying to Sit Details: Manual facilitation for weight shifting Transfers Sit to Stand: 1: +1 Total assist Sit to Stand: Patient Percentage: 10% Sit to Stand Details: Verbal cues for safe use of DME/AE   Trunk/Postural Assessment  Cervical Assessment Cervical Assessment: Exceptions to [facility] Cervical AROM Overall Cervical AROM Comments: Cervical collar on at all times Thoracic Assessment Thoracic Assessment: Exceptions to [facility] Thoracic AROM Overall Thoracic AROM Comments: Kyphotic-  decreased postural control through trunk Postural Control Trunk Control: Impaired sitting trunk control Righting Reactions: Best anterior/posterior with small cone of instability Protective Responses: Impaired Postural Limitations: Continues to demo poor sitting postural stability when back unsupported.    Balance Balance Balance Assessed: Yes Static Sitting Balance Static Sitting - Balance Support:  Feet supported Static Sitting - Level of Assistance: 5: Stand by assistance Static Sitting - Comment/# of Minutes: 3 min max, elbows supported  Extremity/Trunk Assessment RUE Assessment RUE Assessment: Exceptions to [facility] RUE Strength RUE Overall Strength: Deficits (2+/5 strength scapular elevation, 2-/5 shoulder extrension) LUE Assessment LUE Assessment: Exceptions to [facility] LUE Strength LUE Overall Strength: Deficits (2+/5 scapular elevation, 2/5 shoulder extension, 1+/5 finger flexion )  See FIM for current functional status  John Carter 10/11/2013, 7:17 AM

## 2013-10-11 NOTE — Telephone Encounter (Signed)
Spoke with Webb Silversmith.  Appointment has been made for patient.

## 2013-10-11 NOTE — Telephone Encounter (Signed)
yes

## 2013-10-12 ENCOUNTER — Encounter (HOSPITAL_COMMUNITY): Payer: Medicare Other | Admitting: *Deleted

## 2013-10-15 ENCOUNTER — Encounter (HOSPITAL_COMMUNITY): Payer: Medicare Other | Admitting: *Deleted

## 2013-10-16 ENCOUNTER — Encounter (HOSPITAL_COMMUNITY): Payer: Medicare Other | Admitting: *Deleted

## 2013-10-17 ENCOUNTER — Encounter (HOSPITAL_COMMUNITY): Payer: Medicare Other | Admitting: *Deleted

## 2013-10-18 ENCOUNTER — Encounter (HOSPITAL_COMMUNITY): Payer: Medicare Other | Admitting: *Deleted

## 2013-10-19 ENCOUNTER — Encounter (HOSPITAL_COMMUNITY): Payer: Medicare Other | Admitting: *Deleted

## 2013-10-31 ENCOUNTER — Telehealth: Payer: Self-pay | Admitting: Hematology and Oncology

## 2013-10-31 NOTE — Telephone Encounter (Signed)
returned Pamala Hurry call at [facility] living facility and lvm regarding to new appt d.t

## 2013-11-16 ENCOUNTER — Telehealth: Payer: Self-pay | Admitting: *Deleted

## 2013-11-16 NOTE — Telephone Encounter (Signed)
Wife asks if pt needs to have any labs done at [facility] prior to his appt w/ Dr. Alvy Bimler on 5/11? Informed her that labs will be done here on 5/11 and Dr. Alvy Bimler does not need any labs done at [facility] before then.  She verbalized understanding.

## 2013-11-22 ENCOUNTER — Emergency Department (HOSPITAL_COMMUNITY): Payer: Medicare Other

## 2013-11-22 ENCOUNTER — Encounter (HOSPITAL_COMMUNITY): Payer: Self-pay | Admitting: Emergency Medicine

## 2013-11-22 ENCOUNTER — Inpatient Hospital Stay (HOSPITAL_COMMUNITY): Payer: Medicare Other

## 2013-11-22 ENCOUNTER — Inpatient Hospital Stay (HOSPITAL_COMMUNITY)
Admission: EM | Admit: 2013-11-22 | Discharge: 2013-12-17 | DRG: 871 | Disposition: E | Payer: Medicare Other | Attending: Internal Medicine | Admitting: Internal Medicine

## 2013-11-22 DIAGNOSIS — J189 Pneumonia, unspecified organism: Secondary | ICD-10-CM

## 2013-11-22 DIAGNOSIS — E119 Type 2 diabetes mellitus without complications: Secondary | ICD-10-CM | POA: Diagnosis present

## 2013-11-22 DIAGNOSIS — Z66 Do not resuscitate: Secondary | ICD-10-CM | POA: Diagnosis not present

## 2013-11-22 DIAGNOSIS — D696 Thrombocytopenia, unspecified: Secondary | ICD-10-CM | POA: Diagnosis present

## 2013-11-22 DIAGNOSIS — J690 Pneumonitis due to inhalation of food and vomit: Secondary | ICD-10-CM | POA: Diagnosis present

## 2013-11-22 DIAGNOSIS — R7881 Bacteremia: Secondary | ICD-10-CM

## 2013-11-22 DIAGNOSIS — J80 Acute respiratory distress syndrome: Secondary | ICD-10-CM

## 2013-11-22 DIAGNOSIS — G9340 Encephalopathy, unspecified: Secondary | ICD-10-CM

## 2013-11-22 DIAGNOSIS — R0902 Hypoxemia: Secondary | ICD-10-CM

## 2013-11-22 DIAGNOSIS — E872 Acidosis, unspecified: Secondary | ICD-10-CM | POA: Diagnosis present

## 2013-11-22 DIAGNOSIS — N390 Urinary tract infection, site not specified: Secondary | ICD-10-CM | POA: Diagnosis present

## 2013-11-22 DIAGNOSIS — Z8673 Personal history of transient ischemic attack (TIA), and cerebral infarction without residual deficits: Secondary | ICD-10-CM

## 2013-11-22 DIAGNOSIS — R7402 Elevation of levels of lactic acid dehydrogenase (LDH): Secondary | ICD-10-CM | POA: Diagnosis not present

## 2013-11-22 DIAGNOSIS — R6521 Severe sepsis with septic shock: Secondary | ICD-10-CM

## 2013-11-22 DIAGNOSIS — J9600 Acute respiratory failure, unspecified whether with hypoxia or hypercapnia: Secondary | ICD-10-CM

## 2013-11-22 DIAGNOSIS — I2510 Atherosclerotic heart disease of native coronary artery without angina pectoris: Secondary | ICD-10-CM | POA: Diagnosis present

## 2013-11-22 DIAGNOSIS — D649 Anemia, unspecified: Secondary | ICD-10-CM

## 2013-11-22 DIAGNOSIS — Z8249 Family history of ischemic heart disease and other diseases of the circulatory system: Secondary | ICD-10-CM

## 2013-11-22 DIAGNOSIS — I4891 Unspecified atrial fibrillation: Secondary | ICD-10-CM | POA: Diagnosis present

## 2013-11-22 DIAGNOSIS — Z85820 Personal history of malignant melanoma of skin: Secondary | ICD-10-CM

## 2013-11-22 DIAGNOSIS — Z9861 Coronary angioplasty status: Secondary | ICD-10-CM

## 2013-11-22 DIAGNOSIS — G8250 Quadriplegia, unspecified: Secondary | ICD-10-CM | POA: Diagnosis present

## 2013-11-22 DIAGNOSIS — Z860100 Personal history of colon polyps, unspecified: Secondary | ICD-10-CM

## 2013-11-22 DIAGNOSIS — G4733 Obstructive sleep apnea (adult) (pediatric): Secondary | ICD-10-CM | POA: Diagnosis present

## 2013-11-22 DIAGNOSIS — S14129A Central cord syndrome at unspecified level of cervical spinal cord, initial encounter: Secondary | ICD-10-CM

## 2013-11-22 DIAGNOSIS — Z515 Encounter for palliative care: Secondary | ICD-10-CM

## 2013-11-22 DIAGNOSIS — A419 Sepsis, unspecified organism: Principal | ICD-10-CM

## 2013-11-22 DIAGNOSIS — Z833 Family history of diabetes mellitus: Secondary | ICD-10-CM

## 2013-11-22 DIAGNOSIS — I252 Old myocardial infarction: Secondary | ICD-10-CM

## 2013-11-22 DIAGNOSIS — R740 Nonspecific elevation of levels of transaminase and lactic acid dehydrogenase [LDH]: Secondary | ICD-10-CM

## 2013-11-22 DIAGNOSIS — Z96659 Presence of unspecified artificial knee joint: Secondary | ICD-10-CM

## 2013-11-22 DIAGNOSIS — I10 Essential (primary) hypertension: Secondary | ICD-10-CM | POA: Diagnosis present

## 2013-11-22 DIAGNOSIS — J9690 Respiratory failure, unspecified, unspecified whether with hypoxia or hypercapnia: Secondary | ICD-10-CM

## 2013-11-22 DIAGNOSIS — Z981 Arthrodesis status: Secondary | ICD-10-CM

## 2013-11-22 DIAGNOSIS — E785 Hyperlipidemia, unspecified: Secondary | ICD-10-CM | POA: Diagnosis present

## 2013-11-22 DIAGNOSIS — E440 Moderate protein-calorie malnutrition: Secondary | ICD-10-CM | POA: Diagnosis present

## 2013-11-22 DIAGNOSIS — R7401 Elevation of levels of liver transaminase levels: Secondary | ICD-10-CM | POA: Diagnosis not present

## 2013-11-22 DIAGNOSIS — E876 Hypokalemia: Secondary | ICD-10-CM | POA: Diagnosis present

## 2013-11-22 DIAGNOSIS — K219 Gastro-esophageal reflux disease without esophagitis: Secondary | ICD-10-CM | POA: Diagnosis present

## 2013-11-22 DIAGNOSIS — R6520 Severe sepsis without septic shock: Secondary | ICD-10-CM

## 2013-11-22 LAB — I-STAT ARTERIAL BLOOD GAS, ED
Acid-base deficit: 8 mmol/L — ABNORMAL HIGH (ref 0.0–2.0)
Bicarbonate: 20.4 mEq/L (ref 20.0–24.0)
O2 SAT: 86 %
TCO2: 22 mmol/L (ref 0–100)
pCO2 arterial: 56.9 mmHg — ABNORMAL HIGH (ref 35.0–45.0)
pH, Arterial: 7.167 — CL (ref 7.350–7.450)
pO2, Arterial: 68 mmHg — ABNORMAL LOW (ref 80.0–100.0)

## 2013-11-22 LAB — BLOOD GAS, ARTERIAL
Acid-base deficit: 7.1 mmol/L — ABNORMAL HIGH (ref 0.0–2.0)
BICARBONATE: 18.8 meq/L — AB (ref 20.0–24.0)
DRAWN BY: 36496
FIO2: 1 %
MECHVT: 530 mL
O2 SAT: 99.5 %
PCO2 ART: 45.6 mmHg — AB (ref 35.0–45.0)
PEEP/CPAP: 14 cmH2O
PH ART: 7.243 — AB (ref 7.350–7.450)
Patient temperature: 99.8
RATE: 35 resp/min
TCO2: 20.1 mmol/L (ref 0–100)
pO2, Arterial: 234 mmHg — ABNORMAL HIGH (ref 80.0–100.0)

## 2013-11-22 LAB — CBC WITH DIFFERENTIAL/PLATELET
BASOS PCT: 0 % (ref 0–1)
Basophils Absolute: 0 10*3/uL (ref 0.0–0.1)
Eosinophils Absolute: 0.2 10*3/uL (ref 0.0–0.7)
Eosinophils Relative: 1 % (ref 0–5)
HCT: 29 % — ABNORMAL LOW (ref 39.0–52.0)
HEMOGLOBIN: 9.4 g/dL — AB (ref 13.0–17.0)
Lymphocytes Relative: 12 % (ref 12–46)
Lymphs Abs: 2.7 10*3/uL (ref 0.7–4.0)
MCH: 32.4 pg (ref 26.0–34.0)
MCHC: 32.4 g/dL (ref 30.0–36.0)
MCV: 100 fL (ref 78.0–100.0)
MONO ABS: 0.9 10*3/uL (ref 0.1–1.0)
Monocytes Relative: 4 % (ref 3–12)
NEUTROS PCT: 83 % — AB (ref 43–77)
Neutro Abs: 18.3 10*3/uL — ABNORMAL HIGH (ref 1.7–7.7)
Platelets: 128 10*3/uL — ABNORMAL LOW (ref 150–400)
RBC: 2.9 MIL/uL — AB (ref 4.22–5.81)
RDW: 18 % — ABNORMAL HIGH (ref 11.5–15.5)
WBC: 22.1 10*3/uL — ABNORMAL HIGH (ref 4.0–10.5)

## 2013-11-22 LAB — COMPREHENSIVE METABOLIC PANEL
ALBUMIN: 1.6 g/dL — AB (ref 3.5–5.2)
ALK PHOS: 105 U/L (ref 39–117)
ALT: 34 U/L (ref 0–53)
AST: 69 U/L — ABNORMAL HIGH (ref 0–37)
BILIRUBIN TOTAL: 0.4 mg/dL (ref 0.3–1.2)
BUN: 17 mg/dL (ref 6–23)
CO2: 18 mEq/L — ABNORMAL LOW (ref 19–32)
Calcium: 7.2 mg/dL — ABNORMAL LOW (ref 8.4–10.5)
Chloride: 108 mEq/L (ref 96–112)
Creatinine, Ser: 0.6 mg/dL (ref 0.50–1.35)
GFR calc Af Amer: >90 mL/min (ref >90)
GFR calc non Af Amer: >90 mL/min (ref >90)
Glucose, Bld: 145 mg/dL — ABNORMAL HIGH (ref 70–99)
POTASSIUM: 3.8 meq/L (ref 3.7–5.3)
SODIUM: 139 meq/L (ref 137–147)
Total Protein: 7.2 g/dL (ref 6.0–8.3)

## 2013-11-22 LAB — BASIC METABOLIC PANEL
BUN: 14 mg/dL (ref 6–23)
BUN: 17 mg/dL (ref 6–23)
CHLORIDE: 115 meq/L — AB (ref 96–112)
CHLORIDE: 112 meq/L (ref 96–112)
CO2: 16 mEq/L — ABNORMAL LOW (ref 19–32)
CO2: 19 mEq/L (ref 19–32)
Calcium: 6 mg/dL — CL (ref 8.4–10.5)
Calcium: 6.8 mg/dL — ABNORMAL LOW (ref 8.4–10.5)
Creatinine, Ser: 0.48 mg/dL — ABNORMAL LOW (ref 0.50–1.35)
Creatinine, Ser: 0.62 mg/dL (ref 0.50–1.35)
GFR calc Af Amer: >90 mL/min (ref >90)
GFR calc Af Amer: >90 mL/min (ref >90)
GFR calc non Af Amer: >90 mL/min (ref >90)
GLUCOSE: 130 mg/dL — AB (ref 70–99)
Glucose, Bld: 130 mg/dL — ABNORMAL HIGH (ref 70–99)
POTASSIUM: 3.2 meq/L — AB (ref 3.7–5.3)
Potassium: 3.1 mEq/L — ABNORMAL LOW (ref 3.7–5.3)
Sodium: 143 mEq/L (ref 137–147)
Sodium: 142 mEq/L (ref 137–147)

## 2013-11-22 LAB — POCT I-STAT 3, ART BLOOD GAS (G3+)
Acid-base deficit: 12 mmol/L — ABNORMAL HIGH (ref 0.0–2.0)
Acid-base deficit: 13 mmol/L — ABNORMAL HIGH (ref 0.0–2.0)
BICARBONATE: 15.5 meq/L — AB (ref 20.0–24.0)
Bicarbonate: 16.4 mEq/L — ABNORMAL LOW (ref 20.0–24.0)
O2 SAT: 81 %
O2 Saturation: 84 %
PCO2 ART: 43.9 mmHg (ref 35.0–45.0)
PCO2 ART: 47.5 mmHg — AB (ref 35.0–45.0)
PH ART: 7.148 — AB (ref 7.350–7.450)
PH ART: 7.157 — AB (ref 7.350–7.450)
Patient temperature: 99.2
TCO2: 17 mmol/L (ref 0–100)
TCO2: 18 mmol/L (ref 0–100)
pO2, Arterial: 58 mmHg — ABNORMAL LOW (ref 80.0–100.0)
pO2, Arterial: 64 mmHg — ABNORMAL LOW (ref 80.0–100.0)

## 2013-11-22 LAB — AMYLASE: Amylase: 58 U/L (ref 0–105)

## 2013-11-22 LAB — PROTIME-INR
INR: 1.13 (ref 0.00–1.49)
PROTHROMBIN TIME: 14.3 s (ref 11.6–15.2)

## 2013-11-22 LAB — I-STAT CHEM 8, ED
BUN: 20 mg/dL (ref 6–23)
CALCIUM ION: 1.12 mmol/L — AB (ref 1.13–1.30)
Chloride: 108 mEq/L (ref 96–112)
Creatinine, Ser: 0.7 mg/dL (ref 0.50–1.35)
Glucose, Bld: 145 mg/dL — ABNORMAL HIGH (ref 70–99)
HEMATOCRIT: 30 % — AB (ref 39.0–52.0)
HEMOGLOBIN: 10.2 g/dL — AB (ref 13.0–17.0)
Potassium: 4 mEq/L (ref 3.7–5.3)
Sodium: 142 mEq/L (ref 137–147)
TCO2: 19 mmol/L (ref 0–100)

## 2013-11-22 LAB — URINE MICROSCOPIC-ADD ON

## 2013-11-22 LAB — URINALYSIS, ROUTINE W REFLEX MICROSCOPIC
BILIRUBIN URINE: NEGATIVE
GLUCOSE, UA: NEGATIVE mg/dL
KETONES UR: NEGATIVE mg/dL
Nitrite: NEGATIVE
PROTEIN: 100 mg/dL — AB
Specific Gravity, Urine: 1.018 (ref 1.005–1.030)
Urobilinogen, UA: 0.2 mg/dL (ref 0.0–1.0)
pH: 5.5 (ref 5.0–8.0)

## 2013-11-22 LAB — GLUCOSE, CAPILLARY: GLUCOSE-CAPILLARY: 93 mg/dL (ref 70–99)

## 2013-11-22 LAB — MRSA PCR SCREENING: MRSA by PCR: NEGATIVE

## 2013-11-22 LAB — I-STAT TROPONIN, ED: Troponin i, poc: 0.02 ng/mL (ref 0.00–0.08)

## 2013-11-22 LAB — HEMOGLOBIN A1C
Hgb A1c MFr Bld: 6 % — ABNORMAL HIGH (ref <5.7)
Mean Plasma Glucose: 126 mg/dL — ABNORMAL HIGH (ref <117)

## 2013-11-22 LAB — CK TOTAL AND CKMB (NOT AT ARMC)
CK TOTAL: 184 U/L (ref 7–232)
CK, MB: 1.5 ng/mL (ref 0.3–4.0)
Relative Index: 0.8 (ref 0.0–2.5)

## 2013-11-22 LAB — PROCALCITONIN: Procalcitonin: 0.49 ng/mL

## 2013-11-22 LAB — STREP PNEUMONIAE URINARY ANTIGEN: Strep Pneumo Urinary Antigen: NEGATIVE

## 2013-11-22 LAB — PRO B NATRIURETIC PEPTIDE: PRO B NATRI PEPTIDE: 3596 pg/mL — AB (ref 0–450)

## 2013-11-22 LAB — LACTIC ACID, PLASMA: Lactic Acid, Venous: 1.5 mmol/L (ref 0.5–2.2)

## 2013-11-22 LAB — LIPASE, BLOOD: LIPASE: 17 U/L (ref 11–59)

## 2013-11-22 LAB — TROPONIN I

## 2013-11-22 LAB — PHOSPHORUS: PHOSPHORUS: 3.5 mg/dL (ref 2.3–4.6)

## 2013-11-22 LAB — MAGNESIUM: Magnesium: 1.6 mg/dL (ref 1.5–2.5)

## 2013-11-22 MED ORDER — MIDAZOLAM HCL 2 MG/2ML IJ SOLN
INTRAMUSCULAR | Status: AC
  Filled 2013-11-22: qty 2

## 2013-11-22 MED ORDER — FENTANYL CITRATE 0.05 MG/ML IJ SOLN: 50.0000 ug | INTRAMUSCULAR | Status: DC | PRN

## 2013-11-22 MED ORDER — CALCIUM GLUCONATE 10 % IV SOLN
1.0000 g | Freq: Once | INTRAVENOUS | Status: AC
  Administered 2013-11-22: 1 g via INTRAVENOUS
  Filled 2013-11-22: qty 10

## 2013-11-22 MED ORDER — SODIUM CHLORIDE 0.9 % IV SOLN
1000.0000 mL | Freq: Once | INTRAVENOUS | Status: AC
  Administered 2013-11-22: 1000 mL via INTRAVENOUS

## 2013-11-22 MED ORDER — SODIUM CHLORIDE 0.9 % IV SOLN
1.0000 mg/h | INTRAVENOUS | Status: DC
  Administered 2013-11-22 – 2013-11-23 (×2): 2 mg/h via INTRAVENOUS
  Administered 2013-11-24: 4 mg/h via INTRAVENOUS
  Filled 2013-11-22 (×5): qty 10

## 2013-11-22 MED ORDER — ROCURONIUM BROMIDE 50 MG/5ML IV SOLN
INTRAVENOUS | Status: DC | PRN
  Administered 2013-11-22: 80 mg via INTRAVENOUS

## 2013-11-22 MED ORDER — INSULIN ASPART 100 UNIT/ML ~~LOC~~ SOLN
0.0000 [IU] | SUBCUTANEOUS | Status: DC
  Administered 2013-11-23 – 2013-11-25 (×3): 2 [IU] via SUBCUTANEOUS

## 2013-11-22 MED ORDER — MAGNESIUM SULFATE 40 MG/ML IJ SOLN
INTRAMUSCULAR | Status: AC
  Administered 2013-11-22: 20:00:00
  Filled 2013-11-22: qty 50

## 2013-11-22 MED ORDER — SODIUM CHLORIDE 0.9 % IV SOLN
INTRAVENOUS | Status: DC
  Administered 2013-11-22: 75 mL/h via INTRAVENOUS

## 2013-11-22 MED ORDER — SODIUM CHLORIDE 0.9 % IV SOLN: 1000.0000 mL | INTRAVENOUS | Status: DC

## 2013-11-22 MED ORDER — VANCOMYCIN HCL IN DEXTROSE 1-5 GM/200ML-% IV SOLN
1000.0000 mg | Freq: Once | INTRAVENOUS | Status: AC
  Administered 2013-11-22: 1000 mg via INTRAVENOUS
  Filled 2013-11-22: qty 200

## 2013-11-22 MED ORDER — DOPAMINE-DEXTROSE 3.2-5 MG/ML-% IV SOLN
INTRAVENOUS | Status: AC
  Filled 2013-11-22: qty 250

## 2013-11-22 MED ORDER — FENTANYL CITRATE 0.05 MG/ML IJ SOLN: 100.0000 ug | Freq: Once | INTRAMUSCULAR | Status: DC

## 2013-11-22 MED ORDER — SODIUM CHLORIDE 0.9 % IV SOLN
25.0000 ug/h | INTRAVENOUS | Status: DC
  Administered 2013-11-22: 100 ug/h via INTRAVENOUS
  Administered 2013-11-23: 250 ug/h via INTRAVENOUS
  Administered 2013-11-24: 200 ug/h via INTRAVENOUS
  Administered 2013-11-25 (×2): 100 ug/h via INTRAVENOUS
  Filled 2013-11-22 (×6): qty 50

## 2013-11-22 MED ORDER — ROCURONIUM BROMIDE 50 MG/5ML IV SOLN
INTRAVENOUS | Status: AC
  Filled 2013-11-22: qty 2

## 2013-11-22 MED ORDER — FENTANYL CITRATE 0.05 MG/ML IJ SOLN
INTRAMUSCULAR | Status: AC
  Filled 2013-11-22: qty 2

## 2013-11-22 MED ORDER — NOREPINEPHRINE BITARTRATE 1 MG/ML IV SOLN
2.0000 ug/min | INTRAVENOUS | Status: DC
  Administered 2013-11-22: 15 ug/min via INTRAVENOUS
  Administered 2013-11-24: 2 ug/min via INTRAVENOUS
  Filled 2013-11-22 (×3): qty 16

## 2013-11-22 MED ORDER — FENTANYL CITRATE 0.05 MG/ML IJ SOLN
50.0000 ug | Freq: Once | INTRAMUSCULAR | Status: AC
  Administered 2013-11-22: 50 ug via INTRAVENOUS

## 2013-11-22 MED ORDER — DEXTROSE 5 % IV SOLN
1.0000 g | Freq: Three times a day (TID) | INTRAVENOUS | Status: DC
  Administered 2013-11-22 – 2013-11-26 (×11): 1 g via INTRAVENOUS
  Filled 2013-11-22 (×13): qty 1

## 2013-11-22 MED ORDER — POTASSIUM CHLORIDE 20 MEQ/15ML (10%) PO LIQD
20.0000 meq | Freq: Once | ORAL | Status: AC
  Administered 2013-11-23: 20 meq
  Filled 2013-11-22: qty 15

## 2013-11-22 MED ORDER — ETOMIDATE 2 MG/ML IV SOLN
INTRAVENOUS | Status: DC | PRN
  Administered 2013-11-22: 24 mg via INTRAVENOUS

## 2013-11-22 MED ORDER — CISATRACURIUM BESYLATE 10 MG/ML IV SOLN
3.0000 ug/kg/min | INTRAVENOUS | Status: DC
  Administered 2013-11-22: 3 ug/kg/min via INTRAVENOUS
  Administered 2013-11-23: 5 ug/kg/min via INTRAVENOUS
  Filled 2013-11-22 (×2): qty 20

## 2013-11-22 MED ORDER — ARTIFICIAL TEARS OP OINT
1.0000 | TOPICAL_OINTMENT | Freq: Three times a day (TID) | OPHTHALMIC | Status: DC
Start: 2013-11-22 — End: 2013-11-23
  Administered 2013-11-22 – 2013-11-23 (×2): 1 via OPHTHALMIC
  Filled 2013-11-22: qty 3.5

## 2013-11-22 MED ORDER — CHLORHEXIDINE GLUCONATE 0.12 % MT SOLN
15.0000 mL | Freq: Two times a day (BID) | OROMUCOSAL | Status: DC
  Administered 2013-11-22 – 2013-11-26 (×8): 15 mL via OROMUCOSAL
  Filled 2013-11-22 (×7): qty 15

## 2013-11-22 MED ORDER — SODIUM BICARBONATE 8.4 % IV SOLN
INTRAVENOUS | Status: DC
  Administered 2013-11-22 – 2013-11-23 (×3): via INTRAVENOUS
  Filled 2013-11-22 (×8): qty 150

## 2013-11-22 MED ORDER — MIDAZOLAM HCL 2 MG/2ML IJ SOLN: 2.0000 mg | Freq: Once | INTRAMUSCULAR | Status: AC

## 2013-11-22 MED ORDER — PANTOPRAZOLE SODIUM 40 MG IV SOLR
40.0000 mg | Freq: Every day | INTRAVENOUS | Status: DC
  Administered 2013-11-22 – 2013-11-25 (×4): 40 mg via INTRAVENOUS
  Filled 2013-11-22 (×5): qty 40

## 2013-11-22 MED ORDER — HEPARIN SODIUM (PORCINE) 5000 UNIT/ML IJ SOLN: 5000.0000 [IU] | Freq: Three times a day (TID) | INTRAMUSCULAR | Status: DC

## 2013-11-22 MED ORDER — BIOTENE DRY MOUTH MT LIQD
15.0000 mL | Freq: Four times a day (QID) | OROMUCOSAL | Status: DC
  Administered 2013-11-23 – 2013-11-26 (×14): 15 mL via OROMUCOSAL

## 2013-11-22 MED ORDER — DEXTROSE 5 % IV SOLN
2.0000 g | Freq: Once | INTRAVENOUS | Status: AC
  Administered 2013-11-22: 2 g via INTRAVENOUS

## 2013-11-22 MED ORDER — DEXTROSE 5 % IV SOLN
2.0000 ug/min | INTRAVENOUS | Status: DC
  Administered 2013-11-22: 20 ug/min via INTRAVENOUS
  Filled 2013-11-22 (×2): qty 4

## 2013-11-22 MED ORDER — VANCOMYCIN HCL IN DEXTROSE 1-5 GM/200ML-% IV SOLN
1000.0000 mg | Freq: Two times a day (BID) | INTRAVENOUS | Status: DC
  Administered 2013-11-23 – 2013-11-26 (×7): 1000 mg via INTRAVENOUS
  Filled 2013-11-22 (×8): qty 200

## 2013-11-22 MED ORDER — ETOMIDATE 2 MG/ML IV SOLN
INTRAVENOUS | Status: AC
  Filled 2013-11-22: qty 20

## 2013-11-22 MED ORDER — CISATRACURIUM BOLUS VIA INFUSION
0.1000 mg/kg | Freq: Once | INTRAVENOUS | Status: AC
  Administered 2013-11-22: 8.7 mg via INTRAVENOUS
  Filled 2013-11-22: qty 9

## 2013-11-22 MED ORDER — SODIUM CHLORIDE 0.9 % IV SOLN
0.5000 ug/kg/min | INTRAVENOUS | Status: DC
  Filled 2013-11-22: qty 20

## 2013-11-22 MED ORDER — LIDOCAINE HCL (CARDIAC) 20 MG/ML IV SOLN
INTRAVENOUS | Status: AC
Start: 2013-11-22 — End: 2013-11-23
  Filled 2013-11-22: qty 5

## 2013-11-22 MED ORDER — FENTANYL CITRATE 0.05 MG/ML IJ SOLN
25.0000 ug | INTRAMUSCULAR | Status: DC | PRN
  Filled 2013-11-22: qty 2

## 2013-11-22 MED ORDER — SUCCINYLCHOLINE CHLORIDE 20 MG/ML IJ SOLN
INTRAMUSCULAR | Status: AC
  Filled 2013-11-22: qty 1

## 2013-11-22 MED ORDER — SODIUM BICARBONATE 8.4 % IV SOLN
50.0000 meq | Freq: Once | INTRAVENOUS | Status: AC
  Administered 2013-11-22: 50 meq via INTRAVENOUS
  Filled 2013-11-22: qty 50

## 2013-11-22 MED ORDER — SODIUM CHLORIDE 0.9 % IV SOLN: 250.0000 mL | INTRAVENOUS | Status: DC | PRN

## 2013-11-22 NOTE — Progress Notes (Addendum)
Asked to respond immediately . Patient now in ICU  PEr RN: progressive desaturations and then hypotensioon - near code but never lost pulses. Started on levophed and maintained circulation  Labs showed   - sever acidosis on ARDS protocol   Recent Labs Lab 11/16/2013 1358 11/23/2013 1438 12/11/2013 1733 11/25/2013 1810  PHART  --  7.167* 7.157* 7.148*  PCO2ART  --  56.9* 43.9 47.5*  PO2ART  --  68.0* 58.0* 64.0*  HCO3  --  20.4 15.5* 16.4*  TCO2 19 22 17 18   O2SAT  --  86.0 81.0 84.0     Recent Labs Lab 12/11/2013 1340 12/11/2013 1358 11/30/2013 1725  NA 139 142 143  K 3.8 4.0 3.1*  CL 108 108 115*  CO2 18*  --  16*  GLUCOSE 145* 145* 130*  BUN 17 20 14   CREATININE 0.60 0.70 0.48*  CALCIUM 7.2*  --  6.0*    Recent Labs Lab 12/02/2013 1340 12/08/2013 1358  HGB 9.4* 10.2*  HCT 29.0* 30.0*  WBC 22.1*  --   PLT 128*  --      A Onset of septic shock Onset of worsening resp and metabolic acidosis  P Empiric mag and bicarb bolus given Levophed for map > 65 Deep sedation with Nimbex for severe ards (note he is unresponsive prior to sedation but due to severe ards we have to maintain synchrony); order sent by elink Bicarb gtt for acidosis Wife updated (by RN) Check mag and phos, lft and bmet at 9.40pm - results to elink Check cxr port now ARDS protocl - increased RR to 35/min and increase Vt to 8cc and slowly work down on Vt    30 min additional critical care time   Dr. Brand Males, M.D., [facility].C.P Pulmonary and Critical Care Medicine Staff Physician [place] [facility] Pager: [phone], If no answer or between  15:00h - 7:00h: call [phone]  12/13/2013 7:46 PM

## 2013-11-22 NOTE — Procedures (Signed)
Arterial Catheter Insertion Procedure Note John Carter [id number] Nov 08, 1929  Procedure: Insertion of Arterial Catheter  Indications: Blood pressure monitoring  Procedure Details Consent: Unable to obtain consent because of emergent medical necessity. Time Out: Verified patient identification, verified procedure, site/side was marked, verified correct patient position, special equipment/implants available, medications/allergies/relevent history reviewed, required imaging and test results available.  Performed  Maximum sterile technique was used including antiseptics, cap, gloves, gown, hand hygiene, mask and sheet. Skin prep: Chlorhexidine; local anesthetic administered 20 gauge catheter was inserted into right radial artery using the Seldinger technique.  Evaluation Blood flow good; BP tracing good. Complications: No apparent complications. RT placed arrow catheter on first attempt.   John Carter [facility] 11/28/2013

## 2013-11-22 NOTE — H&P (Signed)
PULMONARY / CRITICAL CARE MEDICINE   Name: John Carter MRN: [id number] DOB: 05-09-1930    ADMISSION DATE:  12/15/2013  REFERRING MD :  EDP PRIMARY SERVICE: PCCM  CHIEF COMPLAINT:  Acute respiratory failure   BRIEF PATIENT DESCRIPTION: 80 yo male with hx multiple myeloma, hx AFib, HTN, OSA, with hx central cord syndrome after a fall, s/p C3-C4 fusion (08/2013) and subsequent SNF rehab admission.  Presented 5/7 after becoming unresponsive while eating lunch.  Significantly hypoxic with respiratory distress/ "gurgling" and intubated on arrival to ER.  Being treated for UTI with po abx at SNF.  Per wife has been more confused over past few days.  PCCM called to admit.   SIGNIFICANT EVENTS / STUDIES:  CT head 5/7>>>neg acute   LINES / TUBES: ETT 5/7>>> R IJ CVL 5/7>>>   CULTURES: BCx2 5/7>>> Urine 5/7>>> Sputum 5/7>>>  ANTIBIOTICS: Vancomycin 5/7>>> Cefepime 5/7>>>  HISTORY OF PRESENT ILLNESS:  80 yo male with hx multiple myeloma (last chemo >3 months ago), HTN, OSA, with hx central cord syndrome after a fall, s/p C3-C4 fusion (08/2013) and subsequent SNF rehab admission.  Presented 5/7 after becoming unresponsive while eating lunch.  Significantly hypoxic with respiratory distress/ "gurgling" and intubated on arrival to ER.  Being treated for UTI with po abx at SNF.  Per wife has been more confused over past few days.  PCCM called to admit.   PAST MEDICAL HISTORY :  Past Medical History  Diagnosis Date  . Angina   . Multiple myeloma 06/2009    was on chemo  . Melanoma   . Hypertension     amlodipine and metoprolol  . Hyperlipidemia     takes Zocor daily  . Coronary artery disease     2 stents placed in 2001  . Myocardial infarction 2001  . Sleep apnea     sleep study done 51yrs ago and pt does use a CPAP;setting of 10  . TIA (transient ischemic attack) 2007  . Arthritis     left knee  . Chronic back pain     compression fracture,degenerative disc disease  .  Bruises easily     pt is on Plavix and Aspirin  . GERD (gastroesophageal reflux disease)     takes Omeprazole daily  . Hiatal hernia   . Gastric ulcer   . Diverticulosis   . Hemorrhoids   . History of colonic polyps   . Urinary frequency   . Nocturia   . Leg cramps   . Cataracts, bilateral   . Hiatal hernia   . Other pancytopenia 04/16/2013  . Knee pain, acute 08/27/2013  . Stroke     TIA  . Central cord syndrome   . Diabetes mellitus without complication     9/37 pt denies dm blood sugar up from meds   Past Surgical History  Procedure Laterality Date  . Back surgery  80's  . Tonsillectomy      as a  child  . Colonoscopy    . Appendectomy      as a child  . Knee surgery  1997    left knee arthroscopy  . Knee arthroscopy  1999    right  . Coronary angioplasty  09/04/1999  . Total knee arthroplasty  06/28/2011    Procedure: TOTAL KNEE ARTHROPLASTY;  Surgeon: Lorn Junes, MD;  Location: [facility];  Service: Orthopedics;  Laterality: Left;  TOTAL KNEE ARTHROPLASTY LEFT SIDE  . Posterior cervical fusion/foraminotomy N/A 09/11/2013  Procedure: CERVICAL THREE TO CERVICAL FOUR POSTERIOR CERVICAL FUSION/FORAMINOTOMY/DECOMPRESSION  LEVEL 1;  Surgeon: Maeola Harman, MD;  Location: [facility];  Service: Neurosurgery;  Laterality: N/A;  C34 posterior fusion with arthrodesis and instrumentation   Prior to Admission medications   Medication Sig Start Date End Date Taking? Authorizing Provider  amLODipine (NORVASC) 5 MG tablet Take 5 mg by mouth daily.    Historical Provider, MD  aspirin 325 MG EC tablet Take 325 mg by mouth daily.    Historical Provider, MD  cetirizine (ZYRTEC) 5 MG tablet Take 5 mg by mouth daily.    Historical Provider, MD  clopidogrel (PLAVIX) 75 MG tablet Take 75 mg by mouth daily with breakfast.    Historical Provider, MD  ergocalciferol (VITAMIN D2) 50000 UNITS capsule Take 50,000 Units by mouth once a week.    Historical Provider, MD  flunisolide (NASALIDE) 25  MCG/ACT (0.025%) SOLN Place 2 sprays into the nose 2 (two) times daily.    Historical Provider, MD  gabapentin (NEURONTIN) 300 MG capsule Take 300 mg by mouth 3 (three) times daily.    Historical Provider, MD  lenalidomide (REVLIMID) 15 MG capsule Take 15 mg by mouth daily.    Historical Provider, MD  metoprolol succinate (TOPROL-XL) 50 MG 24 hr tablet Take 50 mg by mouth daily. Take with or immediately following a meal.    Historical Provider, MD  Multiple Vitamin (MULTIVITAMIN) capsule Take 1 capsule by mouth daily.    Historical Provider, MD  nitroGLYCERIN (NITROSTAT) 0.4 MG SL tablet Place 0.4 mg under the tongue every 5 (five) minutes as needed for chest pain.    Historical Provider, MD  omeprazole (PRILOSEC) 20 MG capsule Take 20 mg by mouth daily.    Historical Provider, MD  simvastatin (ZOCOR) 20 MG tablet Take 20 mg by mouth daily.    Historical Provider, MD  valsartan (DIOVAN) 320 MG tablet Take 320 mg by mouth daily.    Historical Provider, MD   Allergies  Allergen Reactions  . Garlic   . Sulfa Drugs Cross Reactors Rash    FAMILY HISTORY:  Family History  Problem Relation Age of Onset  . Anesthesia problems Neg Hx   . Hypotension Neg Hx   . Malignant hyperthermia Neg Hx   . Pseudochol deficiency Neg Hx   . Heart attack Mother   . Heart attack Father   . Diabetes Father   . Cancer Sister   . Heart attack Brother   . Diabetes Brother    SOCIAL HISTORY:  reports that he has never smoked. He has never used smokeless tobacco. He reports that he does not drink alcohol or use illicit drugs.  REVIEW OF SYSTEMS:  As per HPI - All other systems reviewed and were neg.    VITAL SIGNS: Pulse Rate:  [96-114] 109 (05/07 1335) Resp:  [18-29] 18 (05/07 1335) BP: (111-127)/(58-77) 122/77 mmHg (05/07 1330) SpO2:  [89 %-92 %] 90 % (05/07 1330) FiO2 (%):  [100 %] 100 % (05/07 1335) HEMODYNAMICS:   VENTILATOR SETTINGS: Vent Mode:  [-] PRVC FiO2 (%):  [100 %] 100 % Set Rate:  [18  bmp] 18 bmp Vt Set:  [530 mL] 530 mL PEEP:  [5 cmH20] 5 cmH20 Plateau Pressure:  [20 cmH20] 20 cmH20 INTAKE / OUTPUT: Intake/Output   None     PHYSICAL EXAMINATION: General:  Chronically ill appearing male, critically ill looking as well Neuro:  Sedated post intubation, withdraws to pain HEENT:  Mm dry, no JVD, ETT  Cardiovascular:  s1s2 rrr, mild tachy Lungs:  Coarse throughout, diminished bases, resps even non labored on vent Abdomen:  Mildly distended, firm lower abd Musculoskeletal:  Pale, cool and dry, mottled   LABS:   PULMONARY  Recent Labs Lab 11/17/2013 1358 12/14/2013 1438  PHART  --  7.167*  PCO2ART  --  56.9*  PO2ART  --  68.0*  HCO3  --  20.4  TCO2 19 22  O2SAT  --  86.0    CBC  Recent Labs Lab 12/04/2013 1340 11/29/2013 1358  HGB 9.4* 10.2*  HCT 29.0* 30.0*  WBC 22.1*  --   PLT 128*  --     COAGULATION  Recent Labs Lab 12/15/2013 1340  INR 1.13    CARDIAC   Recent Labs Lab 11/30/2013 1340  TROPONINI <0.30   No results found for this basename: PROBNP,  in the last 168 hours   CHEMISTRY  Recent Labs Lab 11/21/2013 1340 12/13/2013 1358  NA 139 142  K 3.8 4.0  CL 108 108  CO2 18*  --   GLUCOSE 145* 145*  BUN 17 20  CREATININE 0.60 0.70  CALCIUM 7.2*  --    The CrCl is unknown because both a height and weight (above a minimum accepted value) are required for this calculation.   LIVER  Recent Labs Lab 12/12/2013 1340  AST 69*  ALT 34  ALKPHOS 105  BILITOT 0.4  PROT 7.2  ALBUMIN 1.6*  INR 1.13     INFECTIOUS  Recent Labs Lab 11/17/2013 1550  LATICACIDVEN 1.5     ENDOCRINE CBG (last 3)  No results found for this basename: GLUCAP,  in the last 72 hours       IMAGING x48h  Ct Head Wo Contrast  11/23/2013   CLINICAL DATA:  Acutely unresponsive.  EXAM: CT HEAD WITHOUT CONTRAST  TECHNIQUE: Contiguous axial images were obtained from the base of the skull through the vertex without intravenous contrast.  COMPARISON:  CT  HEAD W/O CM dated 08/30/2013; CT C SPINE W/O CM dated 08/30/2013  FINDINGS: The ventricles and sulci are normal for age. No intraparenchymal hemorrhage, mass effect nor midline shift. Patchy supratentorial white matter hypodensities are within normal range for patient's age and though non-specific suggest sequelae of chronic small vessel ischemic disease. No acute large vascular territory infarcts.  No abnormal extra-axial fluid collections. Basal cisterns are patent. Moderate calcific atherosclerosis of the carotid siphons and included vertebral arteries.  No skull fracture. Minimally depressed distal new bilateral nasal bone fractures are present previously. The included ocular globes and orbital contents are non-suspicious. Trace paranasal sinus mucosal thickening without air-fluid levels ; trace bilateral mastoid effusions.  IMPRESSION: No acute intracranial process.  Involutional changes. Moderate white matter changes suggest chronic small vessel ischemic disease.   Electronically Signed   By: Elon Alas   On: 12/01/2013 14:35   Dg Chest Portable 1 View  12/09/2013   CLINICAL DATA:  Respiratory distress.  EXAM: PORTABLE CHEST - 1 VIEW  COMPARISON:  Chest x-ray 09/21/2013.  FINDINGS: Endotracheal tube tip noted 3 cm above the carina. Bibasilar atelectasis and/or infiltrates. Cardiomegaly with normal pulmonary vascularity. No pleural effusion or pneumothorax.  IMPRESSION: 1. Endotracheal tube noted 3 cm above carina. 2. Bibasilar prominent atelectasis and/or infiltrates. 3. Stable cardiomegaly, normal pulmonary vascularity.   Electronically Signed   By: Marcello Moores  Register   On: 12/11/2013 14:03     ASSESSMENT / PLAN:  PULMONARY Acute respiratory failure  PNA - aspiration v  HCAP  And developing ARDS physiology v acute pulm edema (favor ALI/ARDS) Respiratory acidosis  P:   Vent support - 8cc/kg, increase rr 24, increase peep 8, 100%fio2 STart ARDS  Protocol (Staff MD comment) Titrate FiO2 to keep  sats >90%  F/u abg  F/u CXR  Abx as above  Sputum culture     CARDIOVASCULAR Hypotension - mild, improved with volume  Sepsis   P:  EKG  Trend troponin  Check BNP  F/u lactate, pct  CVP monitoring    RENAL No active issue  P:   F/u chem   GASTROINTESTINAL No active issue  Noted to have fresh transient GI bleed in ER after OG tube insertion (Staff MD comment) P:   Hold SQ heparin (can restart if no bleed in 24h) ppi  Nutrition for TF  Will need swallow eval once extubated   HEMATOLOGIC Anemia - mild P:  guaiac all stools F/u cbc  SQ heparin on hold till GIB issue (see above) sorted out (Staf MD comment) Transfuse per ICU guidelines   INFECTIOUS PNA - ?aspiration  UTI  Severe Sepsis P:   Broad spectrum abx as above  Pan culture  F/u pct   ENDOCRINE Hyperglycemia  P:   SSI  NEUROLOGIC AMS - in setting sepsis. CT head neg  P:   Sedation protocol while on vent ; prn fentanyl (Staff MD comment) Consider MRI depending on course- neg CT head but hx afib off anticoagulation post cervical sx  Daily WUA   GLOBAL 11/20/2013: wife updated bedside by staff MD. She is overwhelmed. Full Code. Offered chaplain but she refused though chaplain was talking to her later   Marijean Heath, NP 11/21/2013  2:54 PM Pager: [phone] or [phone]   STAFF NOTE: I, Dr Ann Lions have personally reviewed patient's available data, including medical history, events of note, physical examination and test results as part of my evaluation. I have discussed with resident/NP and other care providers such as pharmacist, RN and RRT.  In addition,  I personally evaluated patient and elicited key findings of aspiration, likely ARDS, Sepsis, VDRF. Not on pressors at this point. See my comments above.  Rest per NP/medical resident whose note is outlined above and that I agree with  The patient is critically ill with multiple organ systems failure and requires high complexity  decision making for assessment and support, frequent evaluation and titration of therapies, application of advanced monitoring technologies and extensive interpretation of multiple databases.   Critical Care Time devoted to patient care services described in this note is  45  Minutes independent of NP time  Dr. Brand Males, M.D., [facility].C.P Pulmonary and Critical Care Medicine Staff Physician [place] [facility] Pager: [phone], If no answer or between  15:00h - 7:00h: call [phone]  12/03/2013 4:37 PM

## 2013-11-22 NOTE — Progress Notes (Signed)
CRITICAL VALUE ALERT  Critical value received:  Ca 6.0  Date of notification:  12/06/13  Time of notification:  1800  Critical value read back:yes  Nurse who received alert:  Alveria Apley   MD notified (1st page):  Dr. Earnest Conroy  Time of first page:  1801  MD notified (2nd page):  Time of second page:  Responding MD:    Time MD responded:  [phone]

## 2013-11-22 NOTE — Progress Notes (Signed)
1855 called respiratory to inform them that pt's sats were approx 80 with good wave form. Told RT that the pt was turned to the left side and the sats rose to 87-88. Kim RT received the message and sent an RT to check on the pt a few minutes later. During nurse-nurse report the pt's sats maintained approx 79 with RT at the beside. Art reading began to rapidly decline systolic 89'V MAP 69-45W. E-Link camered in and Dr. Chase Caller was present at the beside. Bicarb and mag was given. Pt NEVER lost a pulse. Levo was initiated. Further instructions to sedate and paralyze the pt was given. Will continue to monitor. Wife is presently at the bedside.

## 2013-11-22 NOTE — ED Notes (Signed)
Notified CARELINK to upgrade Level 1 Code Sepsis

## 2013-11-22 NOTE — Progress Notes (Signed)
Unit CM UR Completed by [facility] ED CM  W. Caren Garske RN  

## 2013-11-22 NOTE — Progress Notes (Signed)
Dr. Chase Caller changed peep to 10, pt transported to 64M, report given to RT.

## 2013-11-22 NOTE — ED Provider Notes (Signed)
CSN: [id number]     Arrival date & time 12/06/2013  1316 History   First MD Initiated Contact with Patient 11/17/2013 1338     Chief Complaint  Patient presents with  . Shortness of Breath  . Loss of Consciousness     (Consider location/radiation/quality/duration/timing/severity/associated sxs/prior Treatment) HPI 80 year old male brought in by EMS for acute hypoxia and AMS. Per them and wife, patient was at rehab facility after cervical surgery for central cord a couple months ago. Was doing well and then diagnosed with UTI and PNA and being given oral antibiotics. Wife noticed he was confused today and while eating appeared to have acute "gurgling" and became unresponsive. EMS noted him to be severely hypoxic in 60s, bagged up back into 80s. Remained unresponsive. Unable to be intubated because had a gag. Has a foley in place due to his diagnosis of UTI and recent surgery.  Past Medical History  Diagnosis Date  . Angina   . Multiple myeloma 06/2009    was on chemo  . Melanoma   . Hypertension     amlodipine and metoprolol  . Hyperlipidemia     takes Zocor daily  . Coronary artery disease     2 stents placed in 2001  . Myocardial infarction 2001  . Sleep apnea     sleep study done 73yrs ago and pt does use a CPAP;setting of 10  . TIA (transient ischemic attack) 2007  . Arthritis     left knee  . Chronic back pain     compression fracture,degenerative disc disease  . Bruises easily     pt is on Plavix and Aspirin  . GERD (gastroesophageal reflux disease)     takes Omeprazole daily  . Hiatal hernia   . Gastric ulcer   . Diverticulosis   . Hemorrhoids   . History of colonic polyps   . Urinary frequency   . Nocturia   . Leg cramps   . Cataracts, bilateral   . Hiatal hernia   . Other pancytopenia 04/16/2013  . Knee pain, acute 08/27/2013  . Stroke     TIA  . Central cord syndrome   . Diabetes mellitus without complication     2/08 pt denies dm blood sugar up from meds    Past Surgical History  Procedure Laterality Date  . Back surgery  80's  . Tonsillectomy      as a  child  . Colonoscopy    . Appendectomy      as a child  . Knee surgery  1997    left knee arthroscopy  . Knee arthroscopy  1999    right  . Coronary angioplasty  09/04/1999  . Total knee arthroplasty  06/28/2011    Procedure: TOTAL KNEE ARTHROPLASTY;  Surgeon: Lorn Junes, MD;  Location: [facility];  Service: Orthopedics;  Laterality: Left;  TOTAL KNEE ARTHROPLASTY LEFT SIDE  . Posterior cervical fusion/foraminotomy N/A 09/11/2013    Procedure: CERVICAL THREE TO CERVICAL FOUR POSTERIOR CERVICAL FUSION/FORAMINOTOMY/DECOMPRESSION  LEVEL 1;  Surgeon: Erline Levine, MD;  Location: [facility];  Service: Neurosurgery;  Laterality: N/A;  C34 posterior fusion with arthrodesis and instrumentation   Family History  Problem Relation Age of Onset  . Anesthesia problems Neg Hx   . Hypotension Neg Hx   . Malignant hyperthermia Neg Hx   . Pseudochol deficiency Neg Hx   . Heart attack Mother   . Heart attack Father   . Diabetes Father   .  Cancer Sister   . Heart attack Brother   . Diabetes Brother    History  Substance Use Topics  . Smoking status: Never Smoker   . Smokeless tobacco: Never Used  . Alcohol Use: No    Review of Systems  Unable to perform ROS: Patient unresponsive      Allergies  Garlic and Sulfa drugs cross reactors  Home Medications   Prior to Admission medications   Medication Sig Start Date End Date Taking? Authorizing Provider  amLODipine (NORVASC) 5 MG tablet Take 5 mg by mouth daily.    Historical Provider, MD  aspirin 325 MG EC tablet Take 325 mg by mouth daily.    Historical Provider, MD  cetirizine (ZYRTEC) 5 MG tablet Take 5 mg by mouth daily.    Historical Provider, MD  clopidogrel (PLAVIX) 75 MG tablet Take 75 mg by mouth daily with breakfast.    Historical Provider, MD  ergocalciferol (VITAMIN D2) 50000 UNITS capsule Take 50,000 Units by mouth once a  week.    Historical Provider, MD  flunisolide (NASALIDE) 25 MCG/ACT (0.025%) SOLN Place 2 sprays into the nose 2 (two) times daily.    Historical Provider, MD  gabapentin (NEURONTIN) 300 MG capsule Take 300 mg by mouth 3 (three) times daily.    Historical Provider, MD  lenalidomide (REVLIMID) 15 MG capsule Take 15 mg by mouth daily.    Historical Provider, MD  metoprolol succinate (TOPROL-XL) 50 MG 24 hr tablet Take 50 mg by mouth daily. Take with or immediately following a meal.    Historical Provider, MD  Multiple Vitamin (MULTIVITAMIN) capsule Take 1 capsule by mouth daily.    Historical Provider, MD  nitroGLYCERIN (NITROSTAT) 0.4 MG SL tablet Place 0.4 mg under the tongue every 5 (five) minutes as needed for chest pain.    Historical Provider, MD  omeprazole (PRILOSEC) 20 MG capsule Take 20 mg by mouth daily.    Historical Provider, MD  simvastatin (ZOCOR) 20 MG tablet Take 20 mg by mouth daily.    Historical Provider, MD  valsartan (DIOVAN) 320 MG tablet Take 320 mg by mouth daily.    Historical Provider, MD   BP 122/77  Pulse 96  Resp 26  SpO2 90% Physical Exam  Nursing note and vitals reviewed. Constitutional: He appears well-developed and well-nourished.  HENT:  Head: Normocephalic and atraumatic.  Right Ear: External ear normal.  Left Ear: External ear normal.  Nose: Nose normal.  Eyes: Pupils are equal, round, and reactive to light. Right eye exhibits no discharge. Left eye exhibits no discharge.  Neck: Neck supple.  Cardiovascular: Regular rhythm, normal heart sounds and intact distal pulses.  Tachycardia present.   Pulmonary/Chest: Effort normal. He has rhonchi. He has rales.  Fast, shallow breathing with poor overall effort  Abdominal: Soft. There is no tenderness.  Genitourinary:  Foley in place  Musculoskeletal: He exhibits no edema.  Neurological: He is unresponsive. GCS eye subscore is 2. GCS verbal subscore is 1. GCS motor subscore is 1.  Skin: Skin is warm and dry.  No rash noted. No erythema.    ED Course  INTUBATION Date/Time: 12/13/2013 1:42 PM Performed by: Pricilla Loveless T Authorized by: Pricilla Loveless T Consent: The procedure was performed in an emergent situation. Patient identity confirmed: anonymous protocol, patient vented/unresponsive Indications: respiratory failure and  airway protection Intubation method: video-assisted Patient status: paralyzed (RSI) Preoxygenation: BVM Pretreatment medications: none Sedatives: etomidate Paralytic: rocuronium Tube size: 8.0 mm Tube type: cuffed Number of attempts: 1  Cricoid pressure: no Cords visualized: yes Post-procedure assessment: chest rise and CO2 detector Breath sounds: equal Cuff inflated: yes ETT to lip: 24 cm Tube secured with: ETT holder Chest x-ray interpreted by me. Chest x-ray findings: endotracheal tube in appropriate position Patient tolerance: Patient tolerated the procedure well with no immediate complications.  IO LINE INSERTION Date/Time: 12/13/2013 2:35 PM Performed by: Sherwood Gambler T Authorized by: Sherwood Gambler T Consent: The procedure was performed in an emergent situation. Time out: Immediately prior to procedure a "time out" was called to verify the correct patient, procedure, equipment, support staff and site/side marked as required. Indications: fluid administration, medication administration and rapid vascular access Local anesthesia used: no Patient sedated: no Insertion site: right proximal tibia Site preparation: chlorhexidine Preparation: Patient was prepped and draped in the usual sterile fashion. Insertion device: drill device Insertion: needle was inserted through the bony cortex Number of attempts: 1 Confirmation method: stability of the needle, easy infusion of fluids and aspiration of blood/marrow Secured with: gauze dressing Patient tolerance: Patient tolerated the procedure well with no immediate complications.   (including critical care  time) Labs Review Labs Reviewed  I-STAT CHEM 8, ED - Abnormal; Notable for the following:    Glucose, Bld 145 (*)    Calcium, Ion 1.12 (*)    Hemoglobin 10.2 (*)    HCT 30.0 (*)    All other components within normal limits  I-STAT ARTERIAL BLOOD GAS, ED - Abnormal; Notable for the following:    pH, Arterial 7.167 (*)    pCO2 arterial 56.9 (*)    pO2, Arterial 68.0 (*)    Acid-base deficit 8.0 (*)    All other components within normal limits  CULTURE, BLOOD (ROUTINE X 2)  CULTURE, BLOOD (ROUTINE X 2)  URINE CULTURE  CBC WITH DIFFERENTIAL  COMPREHENSIVE METABOLIC PANEL  URINALYSIS, ROUTINE W REFLEX MICROSCOPIC  TROPONIN I  PROTIME-INR  BLOOD GAS, ARTERIAL  I-STAT CG4 LACTIC ACID, ED  I-STAT TROPOININ, ED    Imaging Review Ct Head Wo Contrast  12/06/2013   CLINICAL DATA:  Acutely unresponsive.  EXAM: CT HEAD WITHOUT CONTRAST  TECHNIQUE: Contiguous axial images were obtained from the base of the skull through the vertex without intravenous contrast.  COMPARISON:  CT HEAD W/O CM dated 08/30/2013; CT C SPINE W/O CM dated 08/30/2013  FINDINGS: The ventricles and sulci are normal for age. No intraparenchymal hemorrhage, mass effect nor midline shift. Patchy supratentorial white matter hypodensities are within normal range for patient's age and though non-specific suggest sequelae of chronic small vessel ischemic disease. No acute large vascular territory infarcts.  No abnormal extra-axial fluid collections. Basal cisterns are patent. Moderate calcific atherosclerosis of the carotid siphons and included vertebral arteries.  No skull fracture. Minimally depressed distal new bilateral nasal bone fractures are present previously. The included ocular globes and orbital contents are non-suspicious. Trace paranasal sinus mucosal thickening without air-fluid levels ; trace bilateral mastoid effusions.  IMPRESSION: No acute intracranial process.  Involutional changes. Moderate white matter changes suggest  chronic small vessel ischemic disease.   Electronically Signed   By: Elon Alas   On: 12/08/2013 14:35   Dg Chest Portable 1 View  12/05/2013   CLINICAL DATA:  Respiratory distress.  EXAM: PORTABLE CHEST - 1 VIEW  COMPARISON:  Chest x-ray 09/21/2013.  FINDINGS: Endotracheal tube tip noted 3 cm above the carina. Bibasilar atelectasis and/or infiltrates. Cardiomegaly with normal pulmonary vascularity. No pleural effusion or pneumothorax.  IMPRESSION: 1. Endotracheal tube noted 3 cm  above carina. 2. Bibasilar prominent atelectasis and/or infiltrates. 3. Stable cardiomegaly, normal pulmonary vascularity.   Electronically Signed   By: Marcello Moores  Register   On: 11/17/2013 14:03     EKG Interpretation None     CRITICAL CARE Performed by: Ephraim Hamburger   Total critical care time: 45 minutes  Critical care time was exclusive of separately billable procedures and treating other patients.  Critical care was necessary to treat or prevent imminent or life-threatening deterioration.  Critical care was time spent personally by me on the following activities: development of treatment plan with patient and/or surrogate as well as nursing, discussions with consultants, evaluation of patient's response to treatment, examination of patient, obtaining history from patient or surrogate, ordering and performing treatments and interventions, ordering and review of laboratory studies, ordering and review of radiographic studies, pulse oximetry and re-evaluation of patient's condition.   Angiocath insertion Performed by: Ephraim Hamburger  Consent: Verbal consent obtained. Risks and benefits: risks, benefits and alternatives were discussed Time out: Immediately prior to procedure a "time out" was called to verify the correct patient, procedure, equipment, support staff and site/side marked as required.  Preparation: Patient was prepped and draped in the usual sterile fashion.  Vein Location: left  basilic  Ultrasound Guided  Gauge: 20  Normal blood return and flush without difficulty Patient tolerance: Patient tolerated the procedure well with no immediate complications.    MDM   Final diagnoses:  Respiratory failure  Hypoxia  HCAP (healthcare-associated pneumonia)    Patient hypoxic, unresponsive and poorly ventilating on arrival. After IV access, intubated with RSI for airway protection. Appears to be from aspiration vs PNA. No acute head bleed or other obvious cause of syncope. Most likely from his severe hypoxia causing poor brain perfusion. Given fluids, blood cultures taken and given broad antibiotics. Requiring high FiO2 and increased PEEP to keep oxygenation up. Vent RR increased due to resp acidosis on ABG. Critical care consulted, and they will admit to ICU. I updated wife of situation, including the severity of his illness and poor prognosis. She understands, questions answered at bedside. Remains full code at this time.    Ephraim Hamburger, MD 11/29/2013 1655

## 2013-11-22 NOTE — Progress Notes (Signed)
approx 1700 called E-Link. Reported that the pt was having a lot of PVCs. MD ordered stat BMP with Mag

## 2013-11-22 NOTE — Progress Notes (Signed)
CCM Tiffany Kocher NP at bedside-aware of ABG.  Peep ordered to increased to 8, set rate increased to 24 per order, per ABG.

## 2013-11-22 NOTE — Progress Notes (Signed)
eLink Physician-Brief Progress Note Patient Name: John Carter DOB: 1930-03-09 MRN: [id number]  Date of Service  2013-11-28   HPI/Events of Note     eICU Interventions  Hypokalemia -repleted    Intervention Category Intermediate Interventions: Electrolyte abnormality - evaluation and management  Rigoberto Noel 2013-11-28, 11:51 PM

## 2013-11-22 NOTE — ED Notes (Signed)
John Carter with RT at bedside.

## 2013-11-22 NOTE — ED Notes (Signed)
X-ray called for follow up on central line verification.

## 2013-11-22 NOTE — ED Notes (Signed)
Per EMS, pt is at [facility] [employer], [facility], for pnx.  Today he was being fed lunch and became unresponsive.  Sats of 60% on RA that increased to 80% with bagging.  GCS of 9 - pt opened eyes when talked to.

## 2013-11-22 NOTE — Progress Notes (Signed)
eLink Physician-Brief Progress Note Patient Name: John Carter DOB: 1929/11/22 MRN: [id number]  Date of Service  12-10-13   HPI/Events of Note   Shock Respiratory failure   eICU Interventions   Levophed gtt Bicarbonate bolus and gtt Fentanyl / Versed / Nimbex Bedside MD to see Family notified buy RN    Intervention Category Major Interventions: Shock - evaluation and management;Respiratory failure - evaluation and management  Delicia Berens 12/10/13, 7:25 PM

## 2013-11-22 NOTE — ED Notes (Signed)
Blood noted coming from OG tube - previously only food noted.  Critical Care notified.

## 2013-11-22 NOTE — Procedures (Signed)
ED Course  CENTRAL LINE Date/Time: 11/29/2013 3:54 PM Performed by: Corlis Leak Authorized by: Sherwood Gambler T Consent: Verbal consent obtained. Risks and benefits: risks, benefits and alternatives were discussed Consent given by: spouse Required items: required blood products, implants, devices, and special equipment available Patient identity confirmed: arm band Time out: Immediately prior to procedure a "time out" was called to verify the correct patient, procedure, equipment, support staff and site/side marked as required. Indications: vascular access Local anesthetic: lidocaine 1% without epinephrine Anesthetic total: 5 ml Patient sedated: no Preparation: skin prepped with 2% chlorhexidine Skin prep agent dried: skin prep agent completely dried prior to procedure Sterile barriers: all five maximum sterile barriers used - cap, mask, sterile gown, sterile gloves, and large sterile sheet Hand hygiene: hand hygiene performed prior to central venous catheter insertion Location details: right internal jugular Patient position: flat Catheter type: triple lumen Pre-procedure: landmarks identified Ultrasound guidance: yes Number of attempts: 1 Successful placement: yes Post-procedure: line sutured and dressing applied Assessment: blood return through all ports Patient tolerance: Patient tolerated the procedure well with no immediate complications.

## 2013-11-22 NOTE — Progress Notes (Signed)
Chaplain responded to respiratory distress. Chaplain escorted pt's wife to family room and asked staff to update her at their earliest availability. Chaplain provided prayer and compassionate presence as wife waited for news. Will follow up as necessary.

## 2013-11-22 NOTE — ED Notes (Signed)
Central line placement verified by Dr. Lynford Citizen

## 2013-11-22 NOTE — Progress Notes (Signed)
ANTIBIOTIC CONSULT NOTE - INITIAL  Pharmacy Consult for vancomycin/cefepime Indication: HCAP  Allergies  Allergen Reactions  . Garlic   . Sulfa Drugs Cross Reactors Rash    Patient Measurements: Height: 5\' 7"  (170.2 cm) IBW/kg (Calculated) : 66.1   Vital Signs: BP: 122/77 mmHg (05/07 1330) Pulse Rate: 109 (05/07 1335) Intake/Output from previous day:   Intake/Output from this shift:    Labs:  Recent Labs  11/30/2013 1358  HGB 10.2*  CREATININE 0.70   The CrCl is unknown because both a height and weight (above a minimum accepted value) are required for this calculation. No results found for this basename: VANCOTROUGH, VANCOPEAK, VANCORANDOM, GENTTROUGH, GENTPEAK, GENTRANDOM, TOBRATROUGH, TOBRAPEAK, TOBRARND, AMIKACINPEAK, AMIKACINTROU, AMIKACIN,  in the last 72 hours   Microbiology: No results found for this or any previous visit (from the past 720 hour(s)).  Medical History: Past Medical History  Diagnosis Date  . Angina   . Multiple myeloma 06/2009    was on chemo  . Melanoma   . Hypertension     amlodipine and metoprolol  . Hyperlipidemia     takes Zocor daily  . Coronary artery disease     2 stents placed in 2001  . Myocardial infarction 2001  . Sleep apnea     sleep study done 51yrs ago and pt does use a CPAP;setting of 10  . TIA (transient ischemic attack) 2007  . Arthritis     left knee  . Chronic back pain     compression fracture,degenerative disc disease  . Bruises easily     pt is on Plavix and Aspirin  . GERD (gastroesophageal reflux disease)     takes Omeprazole daily  . Hiatal hernia   . Gastric ulcer   . Diverticulosis   . Hemorrhoids   . History of colonic polyps   . Urinary frequency   . Nocturia   . Leg cramps   . Cataracts, bilateral   . Hiatal hernia   . Other pancytopenia 04/16/2013  . Knee pain, acute 08/27/2013  . Stroke     TIA  . Central cord syndrome   . Diabetes mellitus without complication     0/10 pt denies dm  blood sugar up from meds    Assessment: 83 YOM from rehab facility with AMS and respiratory distress. Pharmacy is consulted to start vancomycin and cefepime empirically for HCAP. Urine and blood cultures are ordered. One time dose of vancomycin 1g and cefepime 2g were ordered while in the ED. Scr 0.7, est. crcl ~ 80 ml/min.   Goal of Therapy:  Vancomycin trough level 15-20 mcg/ml  Plan:  - Vancomycin 1g IV Q 12 hrs, next dose tomorrow 0200 - cefepime 1 IV Q 8 hrs, next dose tomorrow at 2200 - f/u renal function and cultures. - Check vancomycin trough at steady state  Maryanna Shape, PharmD, BCPS  Clinical Pharmacist  Pager: [phone]   11/25/2013,2:24 PM

## 2013-11-22 NOTE — ED Notes (Signed)
Central Line being placed.

## 2013-11-23 DIAGNOSIS — R7881 Bacteremia: Secondary | ICD-10-CM

## 2013-11-23 DIAGNOSIS — G9340 Encephalopathy, unspecified: Secondary | ICD-10-CM

## 2013-11-23 DIAGNOSIS — IMO0002 Reserved for concepts with insufficient information to code with codable children: Secondary | ICD-10-CM

## 2013-11-23 DIAGNOSIS — A419 Sepsis, unspecified organism: Principal | ICD-10-CM

## 2013-11-23 DIAGNOSIS — R6521 Severe sepsis with septic shock: Secondary | ICD-10-CM

## 2013-11-23 DIAGNOSIS — R6520 Severe sepsis without septic shock: Secondary | ICD-10-CM

## 2013-11-23 LAB — BASIC METABOLIC PANEL
BUN: 19 mg/dL (ref 6–23)
CALCIUM: 6.5 mg/dL — AB (ref 8.4–10.5)
CO2: 19 meq/L (ref 19–32)
Chloride: 108 mEq/L (ref 96–112)
Creatinine, Ser: 0.7 mg/dL (ref 0.50–1.35)
GFR calc Af Amer: >90 mL/min (ref >90)
GFR, EST NON AFRICAN AMERICAN: 86 mL/min — AB (ref >90)
Glucose, Bld: 147 mg/dL — ABNORMAL HIGH (ref 70–99)
Potassium: 3.4 mEq/L — ABNORMAL LOW (ref 3.7–5.3)
Sodium: 142 mEq/L (ref 137–147)

## 2013-11-23 LAB — POCT I-STAT 3, ART BLOOD GAS (G3+)
Acid-base deficit: 3 mmol/L — ABNORMAL HIGH (ref 0.0–2.0)
Acid-base deficit: 4 mmol/L — ABNORMAL HIGH (ref 0.0–2.0)
BICARBONATE: 21.8 meq/L (ref 20.0–24.0)
Bicarbonate: 21.4 mEq/L (ref 20.0–24.0)
O2 Saturation: 89 %
O2 Saturation: 90 %
PH ART: 7.352 (ref 7.350–7.450)
PH ART: 7.309 — AB (ref 7.350–7.450)
TCO2: 23 mmol/L (ref 0–100)
TCO2: 23 mmol/L (ref 0–100)
pCO2 arterial: 40 mmHg (ref 35.0–45.0)
pCO2 arterial: 43.2 mmHg (ref 35.0–45.0)
pO2, Arterial: 64 mmHg — ABNORMAL LOW (ref 80.0–100.0)
pO2, Arterial: 68 mmHg — ABNORMAL LOW (ref 80.0–100.0)

## 2013-11-23 LAB — HEPATIC FUNCTION PANEL
ALT: 148 U/L — ABNORMAL HIGH (ref 0–53)
AST: 285 U/L — AB (ref 0–37)
Albumin: 1.2 g/dL — ABNORMAL LOW (ref 3.5–5.2)
Alkaline Phosphatase: 104 U/L (ref 39–117)
BILIRUBIN TOTAL: 0.2 mg/dL — AB (ref 0.3–1.2)
Bilirubin, Direct: <0.2 mg/dL (ref 0.0–0.3)
Total Protein: 5.7 g/dL — ABNORMAL LOW (ref 6.0–8.3)

## 2013-11-23 LAB — MAGNESIUM: MAGNESIUM: 1.4 mg/dL — AB (ref 1.5–2.5)

## 2013-11-23 LAB — URINE CULTURE: Colony Count: 50000

## 2013-11-23 LAB — PRO B NATRIURETIC PEPTIDE: Pro B Natriuretic peptide (BNP): 6415 pg/mL — ABNORMAL HIGH (ref 0–450)

## 2013-11-23 LAB — BLOOD GAS, ARTERIAL
Acid-base deficit: 4.8 mmol/L — ABNORMAL HIGH (ref 0.0–2.0)
BICARBONATE: 19.9 meq/L — AB (ref 20.0–24.0)
DRAWN BY: 36496
FIO2: 1 %
MECHVT: 530 mL
O2 SAT: 98.3 %
PCO2 ART: 40.2 mmHg (ref 35.0–45.0)
PEEP/CPAP: 10 cmH2O
Patient temperature: 101.1
RATE: 35 resp/min
TCO2: 21 mmol/L (ref 0–100)
pH, Arterial: 7.323 — ABNORMAL LOW (ref 7.350–7.450)
pO2, Arterial: 124 mmHg — ABNORMAL HIGH (ref 80.0–100.0)

## 2013-11-23 LAB — GLUCOSE, CAPILLARY
GLUCOSE-CAPILLARY: 120 mg/dL — AB (ref 70–99)
Glucose-Capillary: 102 mg/dL — ABNORMAL HIGH (ref 70–99)
Glucose-Capillary: 117 mg/dL — ABNORMAL HIGH (ref 70–99)
Glucose-Capillary: 119 mg/dL — ABNORMAL HIGH (ref 70–99)
Glucose-Capillary: 121 mg/dL — ABNORMAL HIGH (ref 70–99)
Glucose-Capillary: 136 mg/dL — ABNORMAL HIGH (ref 70–99)

## 2013-11-23 LAB — CBC
HCT: 25 % — ABNORMAL LOW (ref 39.0–52.0)
Hemoglobin: 8.2 g/dL — ABNORMAL LOW (ref 13.0–17.0)
MCH: 32 pg (ref 26.0–34.0)
MCHC: 32.8 g/dL (ref 30.0–36.0)
MCV: 97.7 fL (ref 78.0–100.0)
Platelets: 107 10*3/uL — ABNORMAL LOW (ref 150–400)
RBC: 2.56 MIL/uL — ABNORMAL LOW (ref 4.22–5.81)
RDW: 17.9 % — ABNORMAL HIGH (ref 11.5–15.5)
WBC: 21.9 10*3/uL — ABNORMAL HIGH (ref 4.0–10.5)

## 2013-11-23 LAB — PHOSPHORUS: PHOSPHORUS: 3.2 mg/dL (ref 2.3–4.6)

## 2013-11-23 LAB — LEGIONELLA ANTIGEN, URINE: Legionella Antigen, Urine: NEGATIVE

## 2013-11-23 MED ORDER — FREE WATER
200.0000 mL | Freq: Three times a day (TID) | Status: DC
  Administered 2013-11-23 – 2013-11-26 (×9): 200 mL

## 2013-11-23 MED ORDER — VITAL HIGH PROTEIN PO LIQD
1000.0000 mL | ORAL | Status: DC
  Filled 2013-11-23 (×2): qty 1000

## 2013-11-23 MED ORDER — SODIUM CHLORIDE 0.45 % IV SOLN
INTRAVENOUS | Status: DC
  Administered 2013-11-23 – 2013-11-24 (×2): via INTRAVENOUS
  Filled 2013-11-23 (×6): qty 1000

## 2013-11-23 MED ORDER — POTASSIUM CHLORIDE 20 MEQ/15ML (10%) PO LIQD
20.0000 meq | Freq: Once | ORAL | Status: AC
  Administered 2013-11-23: 20 meq
  Filled 2013-11-23: qty 15

## 2013-11-23 MED ORDER — PRO-STAT SUGAR FREE PO LIQD
30.0000 mL | Freq: Two times a day (BID) | ORAL | Status: DC
  Filled 2013-11-23 (×2): qty 30

## 2013-11-23 MED ORDER — ACETAMINOPHEN 160 MG/5ML PO SOLN
650.0000 mg | Freq: Four times a day (QID) | ORAL | Status: DC | PRN
  Administered 2013-11-23 – 2013-11-26 (×5): 650 mg
  Filled 2013-11-23 (×6): qty 20.3

## 2013-11-23 MED ORDER — OXEPA PO LIQD
1000.0000 mL | ORAL | Status: DC
  Administered 2013-11-23: 1000 mL
  Filled 2013-11-23 (×5): qty 1000

## 2013-11-23 MED ORDER — ALBUMIN HUMAN 5 % IV SOLN
25.0000 g | Freq: Once | INTRAVENOUS | Status: AC
  Administered 2013-11-23: 25 g via INTRAVENOUS
  Filled 2013-11-23: qty 500

## 2013-11-23 MED ORDER — PRO-STAT SUGAR FREE PO LIQD
60.0000 mL | Freq: Three times a day (TID) | ORAL | Status: DC
  Administered 2013-11-23 – 2013-11-26 (×9): 60 mL
  Filled 2013-11-23 (×11): qty 60

## 2013-11-23 NOTE — Progress Notes (Signed)
INITIAL NUTRITION ASSESSMENT  DOCUMENTATION CODES Per approved criteria  -Non-severe (moderate) malnutrition in the context of acute illness or injury -Obesity Unspecified   Pt meets criteria for non-severe (moderate) MALNUTRITION in the context of acute illness/injury as evidenced by intake <75% of estimated energy needs > 7 days and mild fluid accumulation.   INTERVENTION: Utilize 2MPEPuP Protocol: initiate TF via OGT with Oxepa at 25 ml/hr and Prostat 74m TID on day 1: on day 2, increase to goal rate of 30 ml/hr (720 ml per day) to provide 1680 kcals (68% of estimated needs), 135 g protein (100% of estimated needs), and 565 ml free water.   NUTRITION DIAGNOSIS: Inadequate oral intake related to inability to eat as evidenced by NPO status.   Goal: Meet 60-70% of estimated calorie needs, based on ASPEN guidelines for the critically ill obese.   Monitor:  TF initiation/tolerance, weight trend, labs   Reason for Assessment: Consult for Tube Feeding Management   80y.o. male  Admitting Dx: Acute Respiratory Failure   ASSESSMENT: Patient is an 80yo male with hx multiple myeloma (last chemo >3 months ago), HTN, OSA, with hx central cord syndrome after a fall, s/p C3-C4 fusion (08/2013) and subsequent SNF rehab admission. Presented 5/7 after becoming unresponsive while eating lunch. Significantly hypoxic with respiratory distress/ "gurgling" and intubated on arrival to ER. Per wife has been more confused over past few days.   Pt's wife explained that the pt has been eating well up until about 1 week ago when he started to not have an appetite for his favorite foods. She explained that he has not been himself since then. She is not aware if the pt has lost any weight recently and unsure of his usual body weight. Pt chart review, pt has not had any weight loss in the past several months.   Wife explained that pt broke his neck in February and since then has been in the bed a lot and very  in-active. Pt is in a wheelchair.   Nutrition focused physical exam did not reveal any depletion of muscle mass or body fat. Noted that pt has mild-moderate edema.   Patient is intubated on ventilator support. MV:18.9 Temp (24hrs), Avg:100.5 F (38.1 C), Min:98.4 F (36.9 C), Max:101.3 F (38.5 C)   Height: Ht Readings from Last 1 Encounters:  12/04/2013 _0  (1.702 m)    Weight: Wt Readings from Last 1 Encounters:  11/23/13 191 lb 12.8 oz (87 kg)    Ideal Body Weight: 67.2 kg   % Ideal Body Weight: 129%   Wt Readings from Last 10 Encounters:  11/23/13 191 lb 12.8 oz (87 kg)  09/19/13 192 lb 8 oz (87.317 kg)  09/11/13 184 lb 4.9 oz (83.6 kg)  09/11/13 184 lb 4.9 oz (83.6 kg)  08/30/13 193 lb 4.8 oz (87.68 kg)  08/27/13 201 lb 11.2 oz (91.491 kg)  07/30/13 204 lb 4.8 oz (92.67 kg)  07/02/13 201 lb 4.8 oz (91.309 kg)  06/04/13 201 lb 12.8 oz (91.536 kg)  05/07/13 199 lb 8 oz (90.493 kg)    Usual Body Weight: 190 - 200 lbs   % Usual Body Weight: 100%   BMI:  Body mass index is 30.03 kg/(m^2)., Obesity Class I   Estimated Nutritional Needs: Kcal: 21610 Protein: >/= 134 grams  Fluid: >/= 2 L/day  Skin: +1 Generalized edema, +3 RLE edema, +3 LLE edema   Diet Order: NPO  EDUCATION NEEDS: -Education not appropriate at this time  Intake/Output Summary (Last 24 hours) at 11/23/13 0925 Last data filed at 11/23/13 0801  Gross per 24 hour  Intake 5474.56 ml  Output   1850 ml  Net 3624.56 ml    Last BM: 5/8    Labs:   Recent Labs Lab 12/02/2013 1725 11/21/2013 2142 11/23/13 0500  NA 143 142 142  K 3.1* 3.2* 3.4*  CL 115* 112 108  CO2 16* 19 19  BUN _0 CREATININE 0.48* 0.62 0.70  CALCIUM 6.0* 6.8* 6.5*  MG  --  1.6 1.4*  PHOS  --  3.5 3.2  GLUCOSE 130* 130* 147*    CBG (last 3)   Recent Labs  11/23/13 0035 11/23/13 0400 11/23/13 0844  GLUCAP 117* 136* 121*    Scheduled Meds: . antiseptic oral rinse  15 mL Mouth Rinse QID  .  artificial tears  1 application Both Eyes 3 times per day  . ceFEPime (MAXIPIME) IV  1 g Intravenous 3 times per day  . chlorhexidine  15 mL Mouth Rinse BID  . fentaNYL  100 mcg Intravenous Once  . insulin aspart  0-15 Units Subcutaneous 6 times per day  . pantoprazole (PROTONIX) IV  40 mg Intravenous QHS  . vancomycin  1,000 mg Intravenous Q12H    Continuous Infusions: . sodium chloride    . sodium chloride 75 mL/hr (11/21/2013 1515)  . cisatracurium (NIMBEX) infusion 5 mcg/kg/min (11/23/13 0840)  . fentaNYL infusion INTRAVENOUS 100 mcg/hr (12/04/2013 2000)  . midazolam (VERSED) infusion 2 mg/hr (11/23/13 0801)  . norepinephrine (LEVOPHED) Adult infusion 12 mcg/min (11/23/13 0837)  .  sodium bicarbonate  infusion 1000 mL 150 mL/hr at 11/23/13 5170    Past Medical History  Diagnosis Date  . Angina   . Multiple myeloma 06/2009    was on chemo  . Melanoma   . Hypertension     amlodipine and metoprolol  . Hyperlipidemia     takes Zocor daily  . Coronary artery disease     2 stents placed in 2001  . Myocardial infarction 2001  . Sleep apnea     sleep study done 57yr ago and pt does use a CPAP;setting of 10  . TIA (transient ischemic attack) 2007  . Arthritis     left knee  . Chronic back pain     compression fracture,degenerative disc disease  . Bruises easily     pt is on Plavix and Aspirin  . GERD (gastroesophageal reflux disease)     takes Omeprazole daily  . Hiatal hernia   . Gastric ulcer   . Diverticulosis   . Hemorrhoids   . History of colonic polyps   . Urinary frequency   . Nocturia   . Leg cramps   . Cataracts, bilateral   . Hiatal hernia   . Other pancytopenia 04/16/2013  . Knee pain, acute 08/27/2013  . Stroke     TIA  . Central cord syndrome   . Diabetes mellitus without complication     20/17pt denies dm blood sugar up from meds    Past Surgical History  Procedure Laterality Date  . Back surgery  80's  . Tonsillectomy      as a  child  .  Colonoscopy    . Appendectomy      as a child  . Knee surgery  1997    left knee arthroscopy  . Knee arthroscopy  1999    right  . Coronary angioplasty  09/04/1999  .  Total knee arthroplasty  06/28/2011    Procedure: TOTAL KNEE ARTHROPLASTY;  Surgeon: Lorn Junes, MD;  Location: [facility];  Service: Orthopedics;  Laterality: Left;  TOTAL KNEE ARTHROPLASTY LEFT SIDE  . Posterior cervical fusion/foraminotomy N/A 09/11/2013    Procedure: CERVICAL THREE TO CERVICAL FOUR POSTERIOR CERVICAL FUSION/FORAMINOTOMY/DECOMPRESSION  LEVEL 1;  Surgeon: Erline Levine, MD;  Location: [facility];  Service: Neurosurgery;  Laterality: N/A;  C34 posterior fusion with arthrodesis and instrumentation    Carrolyn Leigh, BS Dietetic Intern Pager: [phone]  I agree with student dietitian note; appropriate revisions have been made.  Molli Barrows, RD, LDN, [place] Pager# [phone] After Hours Pager# [phone]

## 2013-11-23 NOTE — Progress Notes (Addendum)
PULMONARY / CRITICAL CARE MEDICINE   Name: John Carter MRN: [id number] DOB: 1930/03/19    ADMISSION DATE:  11/28/2013  REFERRING MD :  EDP PRIMARY SERVICE: PCCM  CHIEF COMPLAINT:  Acute respiratory failure   BRIEF PATIENT DESCRIPTION:  80 yo male with hx multiple myeloma, hx AFib, HTN, OSA, with hx central cord syndrome after a fall, s/p C3-C4 fusion (08/2013) and subsequent SNF rehab admission.  Presented 5/7 after becoming unresponsive while eating lunch.  Significantly hypoxic with respiratory distress/ "gurgling" and intubated on arrival to ER.  Being treated for UTI with po abx at SNF.  Per wife has been more confused over past few days.  PCCM called to admit.   SIGNIFICANT EVENTS / STUDIES:  CT head 5/7>>>neg acute   LINES / TUBES: ETT 5/7 >>  R IJ CVL 5/7 >>  R radial A-line 5/07 >>   CULTURES: Urine 5/07 >> Resp 5/07 >>  Blood 5/07 >> 1/2 GPC pairs >>   ANTIBIOTICS: Vancomycin 5/7 >>  Cefepime 5/7 >>   SUBJ: Intubated, sedated, paralyzed  VITAL SIGNS: Temp:  [99.3 F (37.4 C)-101.3 F (38.5 C)] 101.2 F (38.4 C) (05/08 1500) Pulse Rate:  [86-119] 119 (05/08 1500) Resp:  [17-35] 26 (05/08 1500) BP: (110-177)/(48-64) 125/51 mmHg (05/08 0345) SpO2:  [78 %-100 %] 92 % (05/08 1500) Arterial Line BP: (68-169)/(29-83) 146/42 mmHg (05/08 1500) FiO2 (%):  [50 %-100 %] 50 % (05/08 1545) Weight:  [87 kg (191 lb 12.8 oz)] 87 kg (191 lb 12.8 oz) (05/08 0500) HEMODYNAMICS: CVP:  [3 mmHg-14 mmHg] 9 mmHg VENTILATOR SETTINGS: Vent Mode:  [-] PRVC FiO2 (%):  [50 %-100 %] 50 % Set Rate:  [24 bmp-35 bmp] 35 bmp Vt Set:  [400 mL-530 mL] 400 mL PEEP:  [8 cmH20-14 cmH20] 8 cmH20 Plateau Pressure:  [21 cmH20-28 cmH20] 21 cmH20 INTAKE / OUTPUT: Intake/Output     05/07 0701 - 05/08 0700 05/08 0701 - 05/09 0700   I.V. (mL/kg) 5034.6 (57.9) 491.9 (5.7)   NG/GT 30    IV Piggyback 410    Total Intake(mL/kg) 5474.6 (62.9) 491.9 (5.7)   Urine (mL/kg/hr) 1780 250 (0.3)   Total Output 1780 250   Net +3694.6 +241.9        Stool Occurrence 1 x      PHYSICAL EXAMINATION: General:  unresponsive Neuro:  Sedated and paralyzed HEENT:  WNL, PERRL Cardiovascular:  Tachy, reg, no M noted Lungs: no wheezes Abdomen:  Soft, NT, NABS Ext: cool, no edema   LABS: I have reviewed all of today's lab results. Relevant abnormalities are discussed in the A/P section  CXR: NSC ARDS pattern  ASSESSMENT / PLAN:  PULMONARY Acute respiratory failure  HCAP ARDS Mild respiratory acidosis  P:   Cont full vent support - settings reviewed and/or adjusted ARDS protocol restarted - goal 6 cc/kg IBW Cont vent bundle Daily SBT if/when meets criteria  CARDIOVASCULAR Septic shock PAF P:  CVP goal 10-14 on vent MAP goal > 65 mmHg NS and albumin boluses to achieve CVP goal Norepi if needed to achieve MAP goal  RENAL Hypokalemia  Metabolic acidosis - improving P:   Monitor BMET intermittently Monitor I/Os Correct electrolytes as indicated DC HCO3 gtt Replete K+  GASTROINTESTINAL Transient heme + NGT aspirate, resolved Moderate malnutrition P:   SUP: IV PPI Begin TFs 5/08  HEMATOLOGIC Anemia without overt bleeding Mild thrombocytopenia P:  DVT px: SCDs Hemoccult stool Monitor CBC intermittently Transfuse per usual ICU guidelines  INFECTIOUS Aspiration  PNA Pyuria Severe Sepsis Positive blood culture P:   Micro and abx as above F/U results of blood cultures  ENDOCRINE DM 2, controlled  P:   Cont SSI  NEUROLOGIC AMS Central cord syndrome with quadriplegia ICU associated discomfort Ventilator dyssynchrony P:   DC cisatracurium  Cont sedation per protocol Daily WUA Consider further eval of AMS as indicated noting PAF and no anticoagulation  GLOBAL Wife, brother and siter in law updated @ bedside. Critical illness explained to them. They desire full aggreesive care for now but no escalation of support (inc no CPR/ACLS). If no  discernible improvement by first of next week, wife is prepared to discuss withdrawal   The patient is critically ill with multiple organ systems failure and requires high complexity decision making for assessment and support, frequent evaluation and titration of therapies, application of advanced monitoring technologies and extensive interpretation of multiple databases.   Critical Care Time devoted to patient care services described in this note is 76 minutes  Merton Border, MD ; [facility] [phone].  After 5:30 PM or weekends, call [phone]   11/23/2013 4:43 PM

## 2013-11-24 ENCOUNTER — Inpatient Hospital Stay (HOSPITAL_COMMUNITY): Payer: Medicare Other

## 2013-11-24 LAB — BASIC METABOLIC PANEL
BUN: 25 mg/dL — ABNORMAL HIGH (ref 6–23)
BUN: 35 mg/dL — ABNORMAL HIGH (ref 6–23)
CALCIUM: 6.1 mg/dL — AB (ref 8.4–10.5)
CALCIUM: 6.5 mg/dL — AB (ref 8.4–10.5)
CO2: 21 meq/L (ref 19–32)
CO2: 25 meq/L (ref 19–32)
CREATININE: 0.79 mg/dL (ref 0.50–1.35)
Chloride: 101 mEq/L (ref 96–112)
Chloride: 107 mEq/L (ref 96–112)
Creatinine, Ser: 0.84 mg/dL (ref 0.50–1.35)
GFR calc Af Amer: >90 mL/min (ref >90)
GFR calc Af Amer: >90 mL/min (ref >90)
GFR calc non Af Amer: 80 mL/min — ABNORMAL LOW (ref >90)
GFR calc non Af Amer: 82 mL/min — ABNORMAL LOW (ref >90)
GLUCOSE: 109 mg/dL — AB (ref 70–99)
Glucose, Bld: 110 mg/dL — ABNORMAL HIGH (ref 70–99)
POTASSIUM: 4.4 meq/L (ref 3.7–5.3)
Potassium: 3.7 mEq/L (ref 3.7–5.3)
Sodium: 136 mEq/L — ABNORMAL LOW (ref 137–147)
Sodium: 142 mEq/L (ref 137–147)

## 2013-11-24 LAB — POCT I-STAT 3, ART BLOOD GAS (G3+)
ACID-BASE DEFICIT: 2 mmol/L (ref 0.0–2.0)
Acid-base deficit: 8 mmol/L — ABNORMAL HIGH (ref 0.0–2.0)
Acid-base deficit: 7 mmol/L — ABNORMAL HIGH (ref 0.0–2.0)
Acid-base deficit: 7 mmol/L — ABNORMAL HIGH (ref 0.0–2.0)
BICARBONATE: 17.7 meq/L — AB (ref 20.0–24.0)
BICARBONATE: 18.2 meq/L — AB (ref 20.0–24.0)
BICARBONATE: 18.4 meq/L — AB (ref 20.0–24.0)
BICARBONATE: 24 meq/L (ref 20.0–24.0)
O2 SAT: 83 %
O2 SAT: 91 %
O2 Saturation: 92 %
O2 Saturation: 84 %
PCO2 ART: 35.8 mmHg (ref 35.0–45.0)
PCO2 ART: 36.5 mmHg (ref 35.0–45.0)
PCO2 ART: 37 mmHg (ref 35.0–45.0)
PH ART: 7.313 — AB (ref 7.350–7.450)
PH ART: 7.313 — AB (ref 7.350–7.450)
PO2 ART: 58 mmHg — AB (ref 80.0–100.0)
PO2 ART: 77 mmHg — AB (ref 80.0–100.0)
Patient temperature: 101
Patient temperature: 101
Patient temperature: 101.6
TCO2: 19 mmol/L (ref 0–100)
TCO2: 19 mmol/L (ref 0–100)
TCO2: 19 mmol/L (ref 0–100)
TCO2: 25 mmol/L (ref 0–100)
pCO2 arterial: 50.3 mmHg — ABNORMAL HIGH (ref 35.0–45.0)
pH, Arterial: 7.308 — ABNORMAL LOW (ref 7.350–7.450)
pH, Arterial: 7.297 — ABNORMAL LOW (ref 7.350–7.450)
pO2, Arterial: 75 mmHg — ABNORMAL LOW (ref 80.0–100.0)
pO2, Arterial: 55 mmHg — ABNORMAL LOW (ref 80.0–100.0)

## 2013-11-24 LAB — COMPREHENSIVE METABOLIC PANEL
ALBUMIN: 1.5 g/dL — AB (ref 3.5–5.2)
ALK PHOS: 142 U/L — AB (ref 39–117)
ALT: 157 U/L — AB (ref 0–53)
AST: 258 U/L — ABNORMAL HIGH (ref 0–37)
BILIRUBIN TOTAL: 0.3 mg/dL (ref 0.3–1.2)
BUN: 28 mg/dL — ABNORMAL HIGH (ref 6–23)
CO2: 24 mEq/L (ref 19–32)
Calcium: 6.3 mg/dL — CL (ref 8.4–10.5)
Chloride: 104 mEq/L (ref 96–112)
Creatinine, Ser: 0.85 mg/dL (ref 0.50–1.35)
GFR calc Af Amer: >90 mL/min (ref >90)
GFR calc non Af Amer: 80 mL/min — ABNORMAL LOW (ref >90)
Glucose, Bld: 109 mg/dL — ABNORMAL HIGH (ref 70–99)
POTASSIUM: 3.6 meq/L — AB (ref 3.7–5.3)
SODIUM: 140 meq/L (ref 137–147)
Total Protein: 5.5 g/dL — ABNORMAL LOW (ref 6.0–8.3)

## 2013-11-24 LAB — BLOOD GAS, ARTERIAL
Acid-Base Excess: 0.7 mmol/L (ref 0.0–2.0)
Acid-base deficit: 1.3 mmol/L (ref 0.0–2.0)
BICARBONATE: 23.9 meq/L (ref 20.0–24.0)
Bicarbonate: 26 mEq/L — ABNORMAL HIGH (ref 20.0–24.0)
DRAWN BY: 31276
Drawn by: 36496
FIO2: 0.5 %
FIO2: 0.5 %
LHR: 25 {breaths}/min
MECHVT: 400 mL
MECHVT: 400 mL
O2 SAT: 93.2 %
O2 Saturation: 91.7 %
PEEP/CPAP: 8 cmH2O
PEEP: 8 cmH2O
PO2 ART: 78.9 mmHg — AB (ref 80.0–100.0)
PO2 ART: 79.7 mmHg — AB (ref 80.0–100.0)
Patient temperature: 101.2
Patient temperature: 102.4
RATE: 35 resp/min
TCO2: 27.6 mmol/L (ref 0–100)
TCO2: 25.3 mmol/L (ref 0–100)
pCO2 arterial: 55 mmHg — ABNORMAL HIGH (ref 35.0–45.0)
pCO2 arterial: 52.5 mmHg — ABNORMAL HIGH (ref 35.0–45.0)
pH, Arterial: 7.305 — ABNORMAL LOW (ref 7.350–7.450)
pH, Arterial: 7.293 — ABNORMAL LOW (ref 7.350–7.450)

## 2013-11-24 LAB — MAGNESIUM: MAGNESIUM: 1.3 mg/dL — AB (ref 1.5–2.5)

## 2013-11-24 LAB — GLUCOSE, CAPILLARY
GLUCOSE-CAPILLARY: 81 mg/dL (ref 70–99)
GLUCOSE-CAPILLARY: 100 mg/dL — AB (ref 70–99)
Glucose-Capillary: 103 mg/dL — ABNORMAL HIGH (ref 70–99)
Glucose-Capillary: 93 mg/dL (ref 70–99)
Glucose-Capillary: 95 mg/dL (ref 70–99)
Glucose-Capillary: 99 mg/dL (ref 70–99)

## 2013-11-24 LAB — TROPONIN I: Troponin I: 0.58 ng/mL (ref <0.30)

## 2013-11-24 LAB — CBC
HCT: 21.3 % — ABNORMAL LOW (ref 39.0–52.0)
Hemoglobin: 6.8 g/dL — CL (ref 13.0–17.0)
MCH: 32.1 pg (ref 26.0–34.0)
MCHC: 31.9 g/dL (ref 30.0–36.0)
MCV: 100.5 fL — ABNORMAL HIGH (ref 78.0–100.0)
PLATELETS: 77 10*3/uL — AB (ref 150–400)
RBC: 2.12 MIL/uL — ABNORMAL LOW (ref 4.22–5.81)
RDW: 18.2 % — AB (ref 11.5–15.5)
WBC: 21 10*3/uL — ABNORMAL HIGH (ref 4.0–10.5)

## 2013-11-24 LAB — CULTURE, BLOOD (ROUTINE X 2)

## 2013-11-24 LAB — LACTIC ACID, PLASMA: Lactic Acid, Venous: 2.5 mmol/L — ABNORMAL HIGH (ref 0.5–2.2)

## 2013-11-24 LAB — PHOSPHORUS: Phosphorus: 3.2 mg/dL (ref 2.3–4.6)

## 2013-11-24 MED ORDER — MAGNESIUM SULFATE 4000MG/100ML IJ SOLN
4.0000 g | Freq: Once | INTRAMUSCULAR | Status: AC
  Administered 2013-11-24: 4 g via INTRAVENOUS
  Filled 2013-11-24: qty 100

## 2013-11-24 MED ORDER — POTASSIUM CHLORIDE 20 MEQ/15ML (10%) PO LIQD
ORAL | Status: AC
  Filled 2013-11-24: qty 30

## 2013-11-24 MED ORDER — POTASSIUM CHLORIDE 20 MEQ/15ML (10%) PO LIQD
40.0000 meq | Freq: Once | ORAL | Status: AC
  Administered 2013-11-24: 40 meq
  Filled 2013-11-24: qty 30

## 2013-11-24 MED ORDER — MAGNESIUM SULFATE 40 MG/ML IJ SOLN: 2.0000 g | Freq: Once | INTRAMUSCULAR | Status: DC

## 2013-11-24 MED ORDER — SODIUM CHLORIDE 0.9 % IV SOLN
2.0000 g | Freq: Once | INTRAVENOUS | Status: AC
  Administered 2013-11-24: 2 g via INTRAVENOUS
  Filled 2013-11-24: qty 20

## 2013-11-24 MED ORDER — METOPROLOL TARTRATE 1 MG/ML IV SOLN
INTRAVENOUS | Status: AC
  Filled 2013-11-24: qty 5

## 2013-11-24 MED ORDER — SODIUM CHLORIDE 0.9 % IV SOLN
1.0000 g | Freq: Once | INTRAVENOUS | Status: AC
  Administered 2013-11-24: 1 g via INTRAVENOUS
  Filled 2013-11-24: qty 10

## 2013-11-24 MED ORDER — METOPROLOL TARTRATE 1 MG/ML IV SOLN
5.0000 mg | Freq: Once | INTRAVENOUS | Status: AC
  Administered 2013-11-24: 5 mg via INTRAVENOUS

## 2013-11-24 NOTE — Progress Notes (Signed)
eLink Physician-Brief Progress Note Patient Name: John Carter DOB: May 07, 1930 MRN: [id number]  Date of Service  11/24/2013   HPI/Events of Note   1. Repeat ABG noted. Slight more acidotic but within acceptable range. 2. VT improved. 3. Hypo Mg and Ca noted.   eICU Interventions   1. Keep current vent setting. Repeat ABG in 1 hr. 2. Serial Trops. 3. Replete Ca and Mg.     Intervention Category Major Interventions: Respiratory failure - evaluation and management Intermediate Interventions: Arrhythmia - evaluation and management;Electrolyte abnormality - evaluation and management  Mariea Clonts 11/24/2013, 6:54 PM

## 2013-11-24 NOTE — Progress Notes (Signed)
CRITICAL VALUE ALERT  Critical value received: HGB= 6.8 Date of notification:  11-24-13  Time of notification:  76  Critical value read back:yes  Nurse who received alert:  Desmond Dike RN  MD notified (1st page):  Dr. Jimmy Footman  Time of first page:  [phone]  MD notified (2nd page):  Time of second page:  Responding MD:  Dr. Jimmy Footman  Time MD responded:  [phone]  Note: No additional orders in place, will continue to monitor.

## 2013-11-24 NOTE — Progress Notes (Signed)
Dr. Lowry Ram made aware of ABG results.  No changes to be made at this time.

## 2013-11-24 NOTE — Progress Notes (Signed)
eLink Physician-Brief Progress Note Patient Name: YUVAL RUBENS DOB: [birth year] MRN: [id number]  Date of Service  11/24/2013   HPI/Events of Note   1/2 blood cultures positive for coag negative staph. Likely contaminant. On Vanc.   eICU Interventions   Repeat blood cultures x 2.   Intervention Category Intermediate Interventions: Infection - evaluation and management  Mariea Clonts 11/24/2013, 4:22 PM

## 2013-11-24 NOTE — Progress Notes (Signed)
Dr. Jimmy Footman notified of frequent PVC's, MD order to check electrolytes. Will continue to monitor.  Desmond Dike RN

## 2013-11-24 NOTE — Progress Notes (Signed)
CRITICAL VALUE ALERT  Critical value received: calcium= 6.3 Date of notification:  11-24-13  Time of notification:  0515  Critical value read back:yes  Nurse who received alert:  Desmond Dike RN  MD notified (1st page):  Dr. Jimmy Footman  Time of first page:  0515  MD notified (2nd page):  Time of second page:  Responding MD:  Dr. Jimmy Footman  Time MD responded [phone]

## 2013-11-24 NOTE — Progress Notes (Signed)
[facility] Progress Note Patient Name: John Carter DOB: [birth year] MRN: [id number]  Date of Service  11/24/2013   HPI/Events of Note  Hypocalcemia corrected for low albumin   eICU Interventions  Plan: Calcium replaced   Intervention Category Intermediate Interventions: Electrolyte abnormality - evaluation and management  Guadelupe Sabin Shataria Crist 11/24/2013, 1:29 AM

## 2013-11-24 NOTE — Progress Notes (Signed)
eLink Physician-Brief Progress Note Patient Name: John Carter DOB: [birth year] MRN: [id number]  Date of Service  11/24/2013   HPI/Events of Note   Short bursts VT noted. Hypoxic despite relatively aggressive vent settings.   eICU Interventions   Check stat electroltyes. Increase PEEP. Increase FiO2. Discussed poor prognosis with wife - she is coming in.    Intervention Category Major Interventions: End of life / care limitation discussion  Mariea Clonts 11/24/2013, 5:22 PM

## 2013-11-24 NOTE — Progress Notes (Signed)
CRITICAL VALUE ALERT  Critical value received:  Calcium 6.1  Date of notification:  11/24/2013  Time of notification:  18:44  Critical value read back:yes  Nurse who received alert:  [place] Kent Riendeau, RN  MD notified (1st page):  Margarette Asal, MD  Time of first page:  18:47  MD notified (2nd page):  Time of second page:  Responding MD:  Margarette Asal, MD  Time MD responded:  18:47 order for calcium gluconate 2g

## 2013-11-24 NOTE — Progress Notes (Signed)
PULMONARY / CRITICAL CARE MEDICINE  Name: John Carter MRN: [id number] DOB: June 13, 1930    ADMISSION DATE:  12/04/2013  REFERRING MD :  EDP PRIMARY SERVICE: PCCM  CHIEF COMPLAINT:  Acute respiratory failure   BRIEF PATIENT DESCRIPTION: 80 yo presented 5/7 after becoming unresponsive while eating , hypoxic with agonal efforts upon arrival to ED, intubated.   SIGNIFICANT EVENTS / STUDIES:  5/7  CT head >>> nad  LINES / TUBES: ETT 5/7 >>> R IJ CVL 5/7 >>> R R AL 5/7 >>>   CULTURES: 5/7  Urine  >>> neg 5/7  Resp >>>  5/7  Blood >>> coag neg staph  5/8  Blood >>  ANTIBIOTICS: Vancomycin 5/7 >>> Cefepime 5/7 >>>  INTERVAL HISTORY:  No acute events  VITAL SIGNS: Temp:  [99.2 F (37.3 C)-102 F (38.9 C)] 102 F (38.9 C) (05/09 1700) Pulse Rate:  [57-122] 122 (05/09 1700) Resp:  [21-37] 36 (05/09 1700) BP: (92-145)/(39-57) 128/48 mmHg (05/09 1500) SpO2:  [86 %-100 %] 89 % (05/09 1700) Arterial Line BP: (110-151)/(37-49) 129/40 mmHg (05/09 1700) FiO2 (%):  [40 %-50 %] 40 % (05/09 1601) Weight:  [93.1 kg (205 lb 4 oz)] 93.1 kg (205 lb 4 oz) (05/09 0448) HEMODYNAMICS: CVP:  [7 mmHg-12 mmHg] 12 mmHg VENTILATOR SETTINGS: Vent Mode:  [-] PRVC FiO2 (%):  [40 %-50 %] 40 % Set Rate:  [35 bmp] 35 bmp Vt Set:  [400 mL] 400 mL PEEP:  [5 cmH20-8 cmH20] 5 cmH20 Plateau Pressure:  [17 cmH20-20 cmH20] 17 cmH20 INTAKE / OUTPUT: Intake/Output     05/08 0701 - 05/09 0700 05/09 0701 - 05/10 0700   I.V. (mL/kg) 2522.4 (27.1) 1153.1 (12.4)   NG/GT 755.4 300   IV Piggyback 550 250   Total Intake(mL/kg) 3827.8 (41.1) 1703.1 (18.3)   Urine (mL/kg/hr) 695 (0.3) 540 (0.6)   Total Output 695 540   Net +3132.8 +1163.1          PHYSICAL EXAMINATION: General:  unresponsive Neuro:  Sedated and paralyzed HEENT:  WNL, PERRL Cardiovascular:  Tachy, reg, no M noted Lungs: no wheezes Abdomen:  Soft, NT, NABS Ext: cool, no edema  LABS: CBC  Recent Labs Lab 12/07/2013 1340  11/25/2013 1358 11/23/13 0500 11/24/13 0400  WBC 22.1*  --  21.9* 21.0*  HGB 9.4* 10.2* 8.2* 6.8*  HCT 29.0* 30.0* 25.0* 21.3*  PLT 128*  --  107* 77*   Coag's  Recent Labs Lab 11/16/2013 1340  INR 1.13   BMET  Recent Labs Lab 11/23/13 0500 11/24/13 11/24/13 0400  NA 142 142 140  K 3.4* 3.7 3.6*  CL 108 107 104  CO2 19 25 24   BUN 19 25* 28*  CREATININE 0.70 0.79 0.85  GLUCOSE 147* 110* 109*   Electrolytes  Recent Labs Lab 11/30/2013 2142 11/23/13 0500 11/24/13 11/24/13 0400  CALCIUM 6.8* 6.5* 6.5* 6.3*  MG 1.6 1.4*  --   --   PHOS 3.5 3.2  --   --    Sepsis Markers  Recent Labs Lab 11/25/2013 1550  LATICACIDVEN 1.5  PROCALCITON 0.49   ABG  Recent Labs Lab 11/24/13 0823 11/24/13 0908 11/24/13 1601  PHART 7.308* 7.313* 7.313*  PCO2ART 35.8 36.5 37.0  PO2ART 75.0* 55.0* 58.0*   Liver Enzymes  Recent Labs Lab 12/12/2013 1340 11/23/13 0500 11/24/13 0400  AST 69* 285* 258*  ALT 34 148* 157*  ALKPHOS 105 104 142*  BILITOT 0.4 0.2* 0.3  ALBUMIN 1.6* 1.2* 1.5*   Cardiac Enzymes  Recent Labs Lab 2013/12/12 1340 12-12-2013 1550 11/23/13 0500  TROPONINI <0.30  --   --   PROBNP  --  3596.0* 6415.0*   Glucose  Recent Labs Lab 11/23/13 2032 11/24/13 0029 11/24/13 0404 11/24/13 0733 11/24/13 1124 11/24/13 1528  GLUCAP 120* 99 95 93 81 100*   IMAGING: Dg Chest Port 1 View  11/24/2013   CLINICAL DATA:  f/u respiratory failure  EXAM: PORTABLE CHEST - 1 VIEW  COMPARISON:  DG CHEST 1V PORT dated 12-Dec-2013  FINDINGS: The endotracheal tube. Tip 5 cm above the carina. Right internal jugular catheter tip superior vena cava. NGT tip not viewed on this study. There is diffuse prominence of the interstitial markings. Persistent areas of increased density within the lung bases. Degenerative changes of the shoulders. The cardiac silhouette is enlarged.  IMPRESSION: Support lines and tubes are adequately positioned.  Pulmonary edema stable  Atelectasis within the  lung bases.   Electronically Signed   By: Margaree Mackintosh M.D.   On: 11/24/2013 14:44   Dg Chest Port 1 View  11/23/2013   CLINICAL DATA:  Check endotracheal tube  EXAM: PORTABLE CHEST - 1 VIEW  COMPARISON:  12-12-13  FINDINGS: Endotracheal tube ends in the mid thoracic trachea. Enteric tube crosses the diaphragm. Right IJ catheter in stable position.  No change in mild cardiomegaly. Perihilar interstitial opacities, likely pulmonary edema, are similar to prior. There has been reinflation of the left lower lobe with now visible left diaphragm. Atelectasis or loculated fluid along the right minor fissure is noted. No pneumothorax.  IMPRESSION: 1. Stable, good positioning of tubes and lines. 2. Improved left basilar aeration. 3. Pulmonary edema.   Electronically Signed   By: Jorje Guild M.D.   On: 11/23/2013 07:14   ASSESSMENT / PLAN:  PULMONARY A: Acute respiratory failure  HCAP ARDS Mild respiratory acidosis  P:   Goal pH>7.30, SpO2>92 Continuous mechanical support VAP bundle Daily SBT Trend ABG/CXR  CARDIOVASCULAR A: Septic shock, resolved AF P:  Goal SBP > 110 Levophed gtt wean to off  RENAL A: Hypokalemia  Hypomagnesemia Decreased urine output P:   Trend BMP K 40 x 1 Mg 2 x 2 NS@100  NS 500 x 1 Free water  GASTROINTESTINAL A: Elevated transaminases Moderate malnutrition GI Px P:   Protonix TF  HEMATOLOGIC A: Anemia without overt bleeding Thrombocytopenia VTE Px P:  Trend CBC SCDs  INFECTIOUS A; Aspiration pneumonia UTI Coag neg staph in blood is likely contaminant P:   Micro and abx as above  ENDOCRINE A: DM 2 P:   SSI  NEUROLOGIC A: Acute encephalopathy Central cord syndrome with quadriplegia ICU associated discomfort / Ventilator dyssynchrony P:   Daily WUA Fentanyl / Versed gtt  I have personally obtained history, examined patient, evaluated and interpreted laboratory and imaging results, reviewed medical records, formulated  assessment / plan and placed orders.  CRITICAL CARE:  The patient is critically ill with multiple organ systems failure and requires high complexity decision making for assessment and support, frequent evaluation and titration of therapies, application of advanced monitoring technologies and extensive interpretation of multiple databases. Critical Care Time devoted to patient care services described in this note is 45 minutes.   Doree Fudge, MD Pulmonary and [place] Pager: [phone]  11/24/2013, 5:33 PM

## 2013-11-25 DIAGNOSIS — R6520 Severe sepsis without septic shock: Secondary | ICD-10-CM

## 2013-11-25 DIAGNOSIS — E440 Moderate protein-calorie malnutrition: Secondary | ICD-10-CM | POA: Insufficient documentation

## 2013-11-25 DIAGNOSIS — J189 Pneumonia, unspecified organism: Secondary | ICD-10-CM

## 2013-11-25 DIAGNOSIS — R6521 Severe sepsis with septic shock: Secondary | ICD-10-CM

## 2013-11-25 DIAGNOSIS — A419 Sepsis, unspecified organism: Secondary | ICD-10-CM

## 2013-11-25 LAB — POCT I-STAT 3, ART BLOOD GAS (G3+)
ACID-BASE DEFICIT: 2 mmol/L (ref 0.0–2.0)
ACID-BASE DEFICIT: 7 mmol/L — AB (ref 0.0–2.0)
BICARBONATE: 17.8 meq/L — AB (ref 20.0–24.0)
BICARBONATE: 23.2 meq/L (ref 20.0–24.0)
O2 SAT: 87 %
O2 SAT: 91 %
PO2 ART: 64 mmHg — AB (ref 80.0–100.0)
PO2 ART: 73 mmHg — AB (ref 80.0–100.0)
Patient temperature: 101.9
Patient temperature: 102.3
TCO2: 19 mmol/L (ref 0–100)
TCO2: 25 mmol/L (ref 0–100)
pCO2 arterial: 36.5 mmHg (ref 35.0–45.0)
pCO2 arterial: 46.1 mmHg — ABNORMAL HIGH (ref 35.0–45.0)
pH, Arterial: 7.305 — ABNORMAL LOW (ref 7.350–7.450)
pH, Arterial: 7.319 — ABNORMAL LOW (ref 7.350–7.450)

## 2013-11-25 LAB — BASIC METABOLIC PANEL
BUN: 43 mg/dL — AB (ref 6–23)
CALCIUM: 6.8 mg/dL — AB (ref 8.4–10.5)
CO2: 23 mEq/L (ref 19–32)
CREATININE: 0.89 mg/dL (ref 0.50–1.35)
Chloride: 104 mEq/L (ref 96–112)
GFR calc Af Amer: >90 mL/min (ref >90)
GFR, EST NON AFRICAN AMERICAN: 78 mL/min — AB (ref >90)
GLUCOSE: 96 mg/dL (ref 70–99)
Potassium: 4.6 mEq/L (ref 3.7–5.3)
Sodium: 137 mEq/L (ref 137–147)

## 2013-11-25 LAB — CBC WITH DIFFERENTIAL/PLATELET
BASOS PCT: 0 % (ref 0–1)
Basophils Absolute: 0 10*3/uL (ref 0.0–0.1)
EOS ABS: 0 10*3/uL (ref 0.0–0.7)
Eosinophils Relative: 0 % (ref 0–5)
HEMATOCRIT: 22.7 % — AB (ref 39.0–52.0)
Hemoglobin: 7.4 g/dL — ABNORMAL LOW (ref 13.0–17.0)
LYMPHS ABS: 1.8 10*3/uL (ref 0.7–4.0)
LYMPHS PCT: 9 % — AB (ref 12–46)
MCH: 33 pg (ref 26.0–34.0)
MCHC: 32.6 g/dL (ref 30.0–36.0)
MCV: 101.3 fL — AB (ref 78.0–100.0)
Monocytes Absolute: 0.2 10*3/uL (ref 0.1–1.0)
Monocytes Relative: 1 % — ABNORMAL LOW (ref 3–12)
Neutro Abs: 17.5 10*3/uL — ABNORMAL HIGH (ref 1.7–7.7)
Neutrophils Relative %: 90 % — ABNORMAL HIGH (ref 43–77)
Platelets: 67 10*3/uL — ABNORMAL LOW (ref 150–400)
RBC: 2.24 MIL/uL — ABNORMAL LOW (ref 4.22–5.81)
RDW: 18.4 % — ABNORMAL HIGH (ref 11.5–15.5)
WBC Morphology: INCREASED
WBC: 19.5 10*3/uL — ABNORMAL HIGH (ref 4.0–10.5)

## 2013-11-25 LAB — GLUCOSE, CAPILLARY
GLUCOSE-CAPILLARY: 118 mg/dL — AB (ref 70–99)
Glucose-Capillary: 114 mg/dL — ABNORMAL HIGH (ref 70–99)
Glucose-Capillary: 95 mg/dL (ref 70–99)
Glucose-Capillary: 112 mg/dL — ABNORMAL HIGH (ref 70–99)
Glucose-Capillary: 127 mg/dL — ABNORMAL HIGH (ref 70–99)
Glucose-Capillary: 111 mg/dL — ABNORMAL HIGH (ref 70–99)

## 2013-11-25 LAB — LACTIC ACID, PLASMA
LACTIC ACID, VENOUS: 2.6 mmol/L — AB (ref 0.5–2.2)
Lactic Acid, Venous: 2.1 mmol/L (ref 0.5–2.2)

## 2013-11-25 LAB — PROTIME-INR
INR: 1.33 (ref 0.00–1.49)
Prothrombin Time: 16.2 seconds — ABNORMAL HIGH (ref 11.6–15.2)

## 2013-11-25 LAB — PHOSPHORUS: PHOSPHORUS: 3.2 mg/dL (ref 2.3–4.6)

## 2013-11-25 LAB — TROPONIN I
TROPONIN I: 0.49 ng/mL — AB (ref <0.30)
Troponin I: 0.54 ng/mL (ref <0.30)

## 2013-11-25 LAB — MAGNESIUM: Magnesium: 2.2 mg/dL (ref 1.5–2.5)

## 2013-11-25 MED ORDER — METOPROLOL TARTRATE 1 MG/ML IV SOLN
5.0000 mg | Freq: Once | INTRAVENOUS | Status: AC
  Administered 2013-11-25: 5 mg via INTRAVENOUS

## 2013-11-25 MED ORDER — METOPROLOL TARTRATE 1 MG/ML IV SOLN
INTRAVENOUS | Status: AC
  Administered 2013-11-25: 5 mg via INTRAVENOUS
  Filled 2013-11-25: qty 5

## 2013-11-25 NOTE — Progress Notes (Signed)
PULMONARY / CRITICAL CARE MEDICINE  Name: John Carter MRN: [id number] DOB: August 27, 1929    ADMISSION DATE:  11/25/2013  REFERRING MD :  EDP PRIMARY SERVICE: PCCM  CHIEF COMPLAINT:  Acute respiratory failure   BRIEF PATIENT DESCRIPTION: 80 yo presented 5/7 after becoming unresponsive while eating , hypoxic with agonal efforts upon arrival to ED, intubated.   SIGNIFICANT EVENTS / STUDIES:  5/7  CT head >>> nad 5/9  Goals of care conversation with wife >>> continue current interventions, do not initiate new ones, withdrawal of life sustaining treatment and providing comfort measures when family gathered  LINES / TUBES: ETT 5/7 >>> R IJ CVL 5/7 >>> R R AL 5/7 >>>   CULTURES: 5/7  Urine  >>> neg 5/7  Blood >>> coag neg staph  5/9  Blood >>  ANTIBIOTICS: Vancomycin 5/7 >>> Cefepime 5/7 >>>  INTERVAL HISTORY:  Goals of care meting with wife.  VITAL SIGNS: Temp:  [100.4 F (38 C)-102.7 F (39.3 C)] 102.2 F (39 C) (05/10 0900) Pulse Rate:  [57-134] 108 (05/10 0900) Resp:  [23-36] 32 (05/10 0900) BP: (92-141)/(39-49) 141/45 mmHg (05/10 0328) SpO2:  [87 %-95 %] 89 % (05/10 0900) Arterial Line BP: (92-151)/(37-47) 105/42 mmHg (05/10 0000) FiO2 (%):  [40 %-50 %] 40 % (05/10 0840)  HEMODYNAMICS: CVP:  [9 mmHg-15 mmHg] 9 mmHg  VENTILATOR SETTINGS: Vent Mode:  [-] PRVC FiO2 (%):  [40 %-50 %] 40 % Set Rate:  [25 bmp-35 bmp] 30 bmp Vt Set:  [400 mL] 400 mL PEEP:  [5 cmH20-8 cmH20] 8 cmH20 Plateau Pressure:  [17 cmH20-20 cmH20] 20 cmH20  INTAKE / OUTPUT: Intake/Output     05/09 0701 - 05/10 0700 05/10 0701 - 05/11 0700   I.V. (mL/kg) 1321.1 (14.2) 50 (0.5)   NG/GT 750 60   IV Piggyback 650    Total Intake(mL/kg) 2721.1 (29.2) 110 (1.2)   Urine (mL/kg/hr) 1345 (0.6) 100 (0.3)   Total Output 1345 100   Net +1376.1 +10         PHYSICAL EXAMINATION: General:  No distress Neuro:  GCS 3 HEENT:  OETT / OGT Cardiovascular:  Tachycardic, regular Lungs: Bilateral air  entry, rales Abdomen:  Soft, non tender Ext: No edema  LABS: CBC  Recent Labs Lab 11/23/13 0500 11/24/13 0400 11/25/13 0531  WBC 21.9* 21.0* 19.5*  HGB 8.2* 6.8* 7.4*  HCT 25.0* 21.3* 22.7*  PLT 107* 77* 67*   Coag's  Recent Labs Lab 12/11/2013 1340 11/25/13 0531  INR 1.13 1.33   BMET  Recent Labs Lab 11/24/13 0400 11/24/13 1740 11/25/13 0531  NA 140 136* 137  K 3.6* 4.4 4.6  CL 104 101 104  CO2 24 21 23   BUN 28* 35* 43*  CREATININE 0.85 0.84 0.89  GLUCOSE 109* 109* 96   Electrolytes  Recent Labs Lab 11/23/13 0500  11/24/13 0400 11/24/13 1740 11/25/13 0531  CALCIUM 6.5*  < > 6.3* 6.1* 6.8*  MG 1.4*  --   --  1.3* 2.2  PHOS 3.2  --   --  3.2 3.2  < > = values in this interval not displayed. Sepsis Markers  Recent Labs Lab 11/25/2013 1550 11/24/13 1853 11/25/13 0053 11/25/13 0532  LATICACIDVEN 1.5 2.5* 2.6* 2.1  PROCALCITON 0.49  --   --   --    ABG  Recent Labs Lab 11/24/13 2034 11/25/13 0836 11/25/13 0928  PHART 7.293* 7.305* 7.319*  PCO2ART 52.5* 36.5 46.1*  PO2ART 79.7* 73.0* 64.0*   Liver  Enzymes  Recent Labs Lab 11/23/2013 1340 11/23/13 0500 11/24/13 0400  AST 69* 285* 258*  ALT 34 148* 157*  ALKPHOS 105 104 142*  BILITOT 0.4 0.2* 0.3  ALBUMIN 1.6* 1.2* 1.5*   Cardiac Enzymes  Recent Labs Lab 12/04/2013 1550 11/23/13 0500 11/24/13 1853 11/25/13 0053 11/25/13 0532  TROPONINI  --   --  0.58* 0.54* 0.49*  PROBNP 3596.0* 6415.0*  --   --   --    Glucose  Recent Labs Lab 11/24/13 1124 11/24/13 1528 11/24/13 1946 11/25/13 0042 11/25/13 0410 11/25/13 0718  GLUCAP 81 100* 103* 118* 114* 95   IMAGING:  Dg Chest Port 1 View  11/24/2013   CLINICAL DATA:  f/u respiratory failure  EXAM: PORTABLE CHEST - 1 VIEW  COMPARISON:  DG CHEST 1V PORT dated 11/17/2013  FINDINGS: The endotracheal tube. Tip 5 cm above the carina. Right internal jugular catheter tip superior vena cava. NGT tip not viewed on this study. There is diffuse  prominence of the interstitial markings. Persistent areas of increased density within the lung bases. Degenerative changes of the shoulders. The cardiac silhouette is enlarged.  IMPRESSION: Support lines and tubes are adequately positioned.  Pulmonary edema stable  Atelectasis within the lung bases.   Electronically Signed   By: Margaree Mackintosh M.D.   On: 11/24/2013 14:44   ASSESSMENT / PLAN:  PULMONARY A: Acute respiratory failure  HCAP ARDS P:   Goal pH>7.30, SpO2>92 Continuous mechanical support VAP bundle Daily SBT Trend ABG/CXR  CARDIOVASCULAR A: Septic shock, resolved AF P:  Goal SBP > 110 Levophed gtt  RENAL A: Hypokalemia  Hypomagnesemia Decreased urine output P:   Trend BMP Free water  GASTROINTESTINAL A: Elevated transaminases Moderate malnutrition GI Px P:   Protonix TF  HEMATOLOGIC A: Anemia without overt bleeding Thrombocytopenia VTE Px P:  Trend CBC SCDs  INFECTIOUS A; Aspiration pneumonia UTI Coag neg staph in blood is likely contaminant P:   Micro and abx as above  ENDOCRINE A: DM 2 P:   SSI  NEUROLOGIC A: Acute encephalopathy Central cord syndrome with quadriplegia ICU associated discomfort / Ventilator dyssynchrony P:   Daily WUA Fentanyl / Versed gtt  Goals of care discussion with wife >>> continue current interventions, do not initiate new ones, withdrawal of life sustaining treatment and providing comfort measures when family gathered.  I have personally obtained history, examined patient, evaluated and interpreted laboratory and imaging results, reviewed medical records, formulated assessment / plan and placed orders.  CRITICAL CARE:  The patient is critically ill with multiple organ systems failure and requires high complexity decision making for assessment and support, frequent evaluation and titration of therapies, application of advanced monitoring technologies and extensive interpretation of multiple databases.  Critical Care Time devoted to patient care services described in this note is 35 minutes.   Doree Fudge, MD Pulmonary and [place] Pager: [phone]  11/25/2013, 10:33 AM

## 2013-11-26 ENCOUNTER — Ambulatory Visit: Payer: Medicare Other | Admitting: Hematology and Oncology

## 2013-11-26 ENCOUNTER — Other Ambulatory Visit: Payer: Medicare Other

## 2013-11-26 LAB — GLUCOSE, CAPILLARY
GLUCOSE-CAPILLARY: 111 mg/dL — AB (ref 70–99)
Glucose-Capillary: 100 mg/dL — ABNORMAL HIGH (ref 70–99)
Glucose-Capillary: 110 mg/dL — ABNORMAL HIGH (ref 70–99)

## 2013-11-26 MED ORDER — MIDAZOLAM BOLUS VIA INFUSION
5.0000 mg | INTRAVENOUS | Status: DC | PRN
  Filled 2013-11-26: qty 20

## 2013-11-26 MED ORDER — SODIUM CHLORIDE 0.9 % IV SOLN
10.0000 mg/h | INTRAVENOUS | Status: DC
  Administered 2013-11-26: 6 mg/h via INTRAVENOUS
  Filled 2013-11-26: qty 10

## 2013-11-26 MED ORDER — FENTANYL BOLUS VIA INFUSION
50.0000 ug | INTRAVENOUS | Status: DC | PRN
  Filled 2013-11-26: qty 200

## 2013-11-26 MED ORDER — SODIUM CHLORIDE 0.9 % IV SOLN
100.0000 ug/h | INTRAVENOUS | Status: DC
  Administered 2013-11-26: 100 ug/h via INTRAVENOUS
  Filled 2013-11-26: qty 50

## 2013-11-28 LAB — CULTURE, BLOOD (ROUTINE X 2): Culture: NO GROWTH

## 2013-11-29 NOTE — Procedures (Signed)
I saw and evaluated the patient, reviewed the resident's note and I agree with the findings and plan.  I was present for and supervised the central line performed by Dr. Fredric Dine

## 2013-11-29 NOTE — Progress Notes (Signed)
DISCHARGE SUMMARY    Date of admit: 11/27/2013  1:16 PM Date of discharge: 12-06-2013  5:22 PM Length of Stay: 4 days  PCP is Horton Finer, MD   PROBLEM LIST Active Problems:   Acute respiratory failure   Acute encephalopathy   ARDS (adult respiratory distress syndrome)   Septic shock   Severe sepsis(995.92)   Bacteremia   Malnutrition of moderate degree    SUMMARY John Carter was 80 y.o. patient with    has a past medical history of Angina; Multiple myeloma (06/2009); Melanoma; Hypertension; Hyperlipidemia; Coronary artery disease; Myocardial infarction (2001); Sleep apnea; TIA (transient ischemic attack) (2007); Arthritis; Chronic back pain; Bruises easily; GERD (gastroesophageal reflux disease); Hiatal hernia; Gastric ulcer; Diverticulosis; Hemorrhoids; History of colonic polyps; Urinary frequency; Nocturia; Leg cramps; Cataracts, bilateral; Hiatal hernia; Other pancytopenia (04/16/2013); Knee pain, acute (08/27/2013); Stroke; Central cord syndrome; and Diabetes mellitus without complication.   has past surgical history that includes Back surgery (80's); Tonsillectomy; Colonoscopy; Appendectomy; Knee surgery (1997); Knee arthroscopy (1999); Coronary angioplasty (09/04/1999); Total knee arthroplasty (06/28/2011); and Posterior cervical fusion/foraminotomy (N/A, 09/11/2013).   Admitted on 11/23/2013 with   80 yo male with hx multiple myeloma (last chemo >3 months ago), HTN, OSA, with hx central cord syndrome after a fall, s/p C3-C4 fusion (08/2013) and subsequent SNF rehab admission. Presented 5/7 after becoming unresponsive while eating lunch. Significantly hypoxic with respiratory distress/ "gurgling" and intubated on arrival to ER. Being treated for UTI with po abx at SNF. Per wife has been more confused over past few days. PCCM called to admit   SIGNIFICANT EVENTS / STUDIES:   5/7 CT head >>> nad  5/9 Goals of care conversation with wife >>> continue current  interventions, do not initiate new ones, withdrawal of life sustaining treatment and providing comfort measures when family gathered  Over next 48h encephalopathy and failure to wean continued. FAmily discussion indicated desire for comfort.  PAtient was terminally weaned and expired 2013/12/06      SIGNED Dr. Brand Males, M.D., [facility].C.P Pulmonary and Critical Care Medicine Staff Physician [place] [facility] Pager: [phone], If no answer or between  15:00h - 7:00h: call [phone]  11/29/2013 6:04 PM

## 2013-11-30 NOTE — Discharge Summary (Signed)
DISCHARGE SUMMARY    Date of admit: 12/12/2013  1:16 PM Date of discharge: 11/25/2013  5:22 PM Length of Stay: 4 days  PCP is OSBORNE,JAMES CHARLES, MD   PROBLEM LIST Active Problems:   Acute respiratory failure   Acute encephalopathy   ARDS (adult respiratory distress syndrome)   Septic shock   Severe sepsis(995.92)   Bacteremia   Malnutrition of moderate degree    SUMMARY John Carter was 80 y.o. patient with    has a past medical history of Angina; Multiple myeloma (06/2009); Melanoma; Hypertension; Hyperlipidemia; Coronary artery disease; Myocardial infarction (2001); Sleep apnea; TIA (transient ischemic attack) (2007); Arthritis; Chronic back pain; Bruises easily; GERD (gastroesophageal reflux disease); Hiatal hernia; Gastric ulcer; Diverticulosis; Hemorrhoids; History of colonic polyps; Urinary frequency; Nocturia; Leg cramps; Cataracts, bilateral; Hiatal hernia; Other pancytopenia (04/16/2013); Knee pain, acute (08/27/2013); Stroke; Central cord syndrome; and Diabetes mellitus without complication.   has past surgical history that includes Back surgery (80's); Tonsillectomy; Colonoscopy; Appendectomy; Knee surgery (1997); Knee arthroscopy (1999); Coronary angioplasty (09/04/1999); Total knee arthroplasty (06/28/2011); and Posterior cervical fusion/foraminotomy (N/A, 09/11/2013).   Admitted on 12/15/2013 with   80 yo male with hx multiple myeloma (last chemo >3 months ago), HTN, OSA, with hx central cord syndrome after a fall, s/p C3-C4 fusion (08/2013) and subsequent SNF rehab admission. Presented 5/7 after becoming unresponsive while eating lunch. Significantly hypoxic with respiratory distress/ "gurgling" and intubated on arrival to ER. Being treated for UTI with po abx at SNF. Per wife has been more confused over past few days. PCCM called to admit   SIGNIFICANT EVENTS / STUDIES:   5/7 CT head >>> nad  5/9 Goals of care conversation with wife >>> continue current  interventions, do not initiate new ones, withdrawal of life sustaining treatment and providing comfort measures when family gathered  Over next 48h encephalopathy and failure to wean continued. FAmily discussion indicated desire for comfort.  PAtient was terminally weaned and expired 12/07/2013      SIGNED Dr. Izora Benn, M.D., F.C.C.P Pulmonary and Critical Care Medicine Staff Physician [facility] [place] [facility] Pager: [phone], If no answer or between  15:00h - 7:00h: call [phone]  11/29/2013 6:04 PM      

## 2013-12-01 LAB — CULTURE, BLOOD (ROUTINE X 2)
CULTURE: NO GROWTH
Culture: NO GROWTH

## 2013-12-11 ENCOUNTER — Inpatient Hospital Stay: Payer: Medicare Other | Admitting: Physical Medicine & Rehabilitation

## 2013-12-17 NOTE — Progress Notes (Signed)
UR Completed.  John Carter [name] [phone] 12/06/2013  

## 2013-12-17 NOTE — Progress Notes (Signed)
40 MG versed wasted in sink with Sheppard Penton, RN

## 2013-12-17 NOTE — Progress Notes (Signed)
Dr. Onnie Graham notified of time of expiration.

## 2013-12-17 NOTE — Progress Notes (Signed)
RT note- initiated withdrawal order with nursing, continue to monitor.

## 2013-12-17 NOTE — Progress Notes (Signed)
Fentanyl 151mcg wasted in sink and 10 cc of versed drip wasted in sink. Verified/witnessed by Lenor Coffin, RN.

## 2013-12-17 NOTE — Progress Notes (Signed)
Respiratory therapy note- Patient has been extubated following withdrawal guidelines. RN and family at bedside during procedure.

## 2013-12-17 NOTE — Progress Notes (Addendum)
PULMONARY / CRITICAL CARE MEDICINE  Name: John Carter MRN: [id number] DOB: [birth year]    ADMISSION DATE:  11/18/2013  REFERRING MD :  EDP PRIMARY SERVICE: PCCM  CHIEF COMPLAINT:  Acute respiratory failure   BRIEF PATIENT DESCRIPTION: 80 yo presented 5/7 after becoming unresponsive while eating , hypoxic with agonal efforts upon arrival to ED, intubated.   SIGNIFICANT EVENTS / STUDIES:  5/7  CT head >>> nad 5/9  Goals of care conversation with wife >>> continue current interventions, do not initiate new ones, withdrawal of life sustaining treatment and providing comfort measures when family gathered  LINES / TUBES: ETT 5/7 >>> R IJ CVL 5/7 >>> R R AL 5/7 >>>   CULTURES: 5/7  Urine  >>> neg 5/7  Blood >>> coag neg staph  5/9  Blood >>  ANTIBIOTICS: Vancomycin 5/7 >>>5/11 Cefepime 5/7 >>>5/11  INTERVAL HISTORY:    12/12/2013: wife, elder brother, nephew at bedside along with another male. They are all in agreement for terminwal wean. RASS -3 on fent gtt  VITAL SIGNS: Temp:  [99 F (37.2 C)-102.3 F (39.1 C)] 99 F (37.2 C) (05/11 0900) Pulse Rate:  [82-143] 87 (05/11 0900) Resp:  [26-33] 30 (05/11 0900) BP: (139-171)/(50-52) 171/52 mmHg (05/11 0332) SpO2:  [86 %-95 %] 92 % (05/11 0900) Arterial Line BP: (90-162)/(33-52) 138/40 mmHg (05/11 0900) FiO2 (%):  [40 %] 40 % (05/11 0828) Weight:  [98.6 kg (217 lb 6 oz)] 98.6 kg (217 lb 6 oz) (05/11 0357)  HEMODYNAMICS:    VENTILATOR SETTINGS: Vent Mode:  [-] PRVC FiO2 (%):  [40 %] 40 % Set Rate:  [30 bmp] 30 bmp Vt Set:  [400 mL] 400 mL PEEP:  [8 cmH20] 8 cmH20 Plateau Pressure:  [18 cmH20-20 cmH20] 20 cmH20  INTAKE / OUTPUT: Intake/Output     05/10 0701 - 05/11 0700 05/11 0701 - 05/12 0700   I.V. (mL/kg) 680 (6.9) 60 (0.6)   NG/GT 720 90   IV Piggyback 500    Total Intake(mL/kg) 1900 (19.3) 150 (1.5)   Urine (mL/kg/hr) 1390 (0.6) 125 (0.3)   Total Output 1390 125   Net +510 +25         PHYSICAL  EXAMINATION: General:  No distress Neuro:  RASS -3/-4 HEENT:  OETT / OGT Cardiovascular:  Tachycardic, regular Lungs: Bilateral air entry, rales Abdomen:  Soft, non tender Ext: No edema  LABS: PULMONARY  Recent Labs Lab 11/24/13 1601 11/24/13 1832 11/24/13 2034 11/25/13 0836 11/25/13 0928  PHART 7.313* 7.297* 7.293* 7.305* 7.319*  PCO2ART 37.0 50.3* 52.5* 36.5 46.1*  PO2ART 58.0* 77.0* 79.7* 73.0* 64.0*  HCO3 18.4* 24.0 23.9 17.8* 23.2  TCO2 19 25 25._0 O2SAT 84.0 91.0 91.7 91.0 87.0    CBC  Recent Labs Lab 11/23/13 0500 11/24/13 0400 11/25/13 0531  HGB 8.2* 6.8* 7.4*  HCT 25.0* 21.3* 22.7*  WBC 21.9* 21.0* 19.5*  PLT 107* 77* 67*    COAGULATION  Recent Labs Lab 12/14/2013 1340 11/25/13 0531  INR 1.13 1.33    CARDIAC   Recent Labs Lab 12/08/2013 1340 11/24/13 1853 11/25/13 0053 11/25/13 0532  TROPONINI <0.30 0.58* 0.54* 0.49*    Recent Labs Lab 11/19/2013 1550 11/23/13 0500  PROBNP 3596.0* 6415.0*     CHEMISTRY  Recent Labs Lab 12/02/2013 2142 11/23/13 0500 11/24/13 11/24/13 0400 11/24/13 1740 11/25/13 0531  NA 142 142 142 140 136* 137  K 3.2* 3.4* 3.7 3.6* 4.4 4.6  CL 112 108 107 104 101  104  CO2 _0 GLUCOSE 130* 147* 110* 109* 109* 96  BUN 17 19 25* 28* 35* 43*  CREATININE 0.62 0.70 0.79 0.85 0.84 0.89  CALCIUM 6.8* 6.5* 6.5* 6.3* 6.1* 6.8*  MG 1.6 1.4*  --   --  1.3* 2.2  PHOS 3.5 3.2  --   --  3.2 3.2   Estimated Creatinine Clearance: 72.8 ml/min (by C-G formula based on Cr of 0.89).   LIVER  Recent Labs Lab 12/10/2013 1340 11/23/13 0500 11/24/13 0400 11/25/13 0531  AST 69* 285* 258*  --   ALT 34 148* 157*  --   ALKPHOS 105 104 142*  --   BILITOT 0.4 0.2* 0.3  --   PROT 7.2 5.7* 5.5*  --   ALBUMIN 1.6* 1.2* 1.5*  --   INR 1.13  --   --  1.33     INFECTIOUS  Recent Labs Lab 12/13/2013 1550 11/24/13 1853 11/25/13 0053 11/25/13 0532  LATICACIDVEN 1.5 2.5* 2.6* 2.1  PROCALCITON 0.49  --   --    --      ENDOCRINE CBG (last 3)   Recent Labs  04-Dec-2013 0017 2013-12-04 0401 04-Dec-2013 0813  GLUCAP 100* 110* 111*         IMAGING x48h  No results found.    ASSESSMENT / PLAN:  PULMONARY A: Acute respiratory failure  HCAP ARDS   - failure to wean medically P:   Terminal wean Dec 04, 2013   CARDIOVASCULAR A: Septic shock, resolved AF P:  Goal SBP > 110   RENAL A: Decreased urine output P:   Nil active Rx  GASTROINTESTINAL A: Elevated transaminases (addendum added 01/01/2014: significance unknown, Mild transaminitis possibly. Pattern not c/w shock liver)  Moderate malnutrition GI Px P:   Dc Protonix   HEMATOLOGIC A: Anemia without overt bleeding Thrombocytopenia VTE Px P:  Dc Trend CBC Dc SCDs  INFECTIOUS A; Aspiration pneumonia UTI Coag neg staph in blood is likely contaminant P:   Micro and abx as above; dc Dec 04, 2013  ENDOCRINE A: DM 2 P:   Dc SSI  NEUROLOGIC A: Acute encephalopathy Central cord syndrome with quadriplegia ICU associated discomfort / Ventilator dyssynchrony P:    Fentanyl / Versed gtt   gLOBAL Met with all family: terminal wean process explained They want absolute comfort. Terminal wean orders written. Will add benzo to fent gtt   The patient is critically ill with multiple organ systems failure and requires high complexity decision making for assessment and support, frequent evaluation and titration of therapies, application of advanced monitoring technologies and extensive interpretation of multiple databases.   Critical Care Time devoted to patient care services described in this note is  34  Minutes.  Dr. Brand Males, M.D., [facility].C.P Pulmonary and Critical Care Medicine Staff Physician [place] [facility] and Critical [facility] Pager: [phone], If no answer or between  15:00h - 7:00h: call [phone]  2013-12-04 11:14 AM

## 2013-12-17 NOTE — Progress Notes (Signed)
1206 time of expiration. No heart tones heard via ausculation times two nurses, noted asystole via monitor.  No breath sounds heard via ausculation. Pupils fixed and dilated size 6, non reactive. Ricki Rodriguez, RN and Kalman Shan, RN). [facility] notified with time of expiration. Family at bedside.

## 2013-12-17 NOTE — Progress Notes (Signed)
Chaplain responded to spiritual care consult for end of life support. Met with patient, his wife, brother, sister-in-law, and nephew. Wife said they have been married 4 years and "are a part of each other." Chaplain provided emotional support through empathic listening and pastoral presence. Pt's wife was grateful for support. Please page for follow up.  Ethelene Browns [phone]

## 2013-12-17 NOTE — Progress Notes (Signed)
Nutrition Brief Note  Chart reviewed. Patient discussed in ICU rounds today. Pt now transitioning to comfort care. Terminal wean in place. No further nutrition interventions warranted at this time.  Please re-consult as needed.   Molli Barrows, RD, LDN, [place] Pager [phone] After Hours Pager [phone]

## 2013-12-17 DEATH — deceased

## 2014-10-11 IMAGING — RF DG C-ARM 61-120 MIN
1 series · 1 of 1 positions shown · non-contrast
Comparison: None.

CLINICAL DATA: Cervical fusion

EXAM:
DG CERVICAL SPINE - 1 VIEW; DG C-ARM 1-60 MIN

[Series 1: run · 1 of 1 slices shown]
[im 1/1]
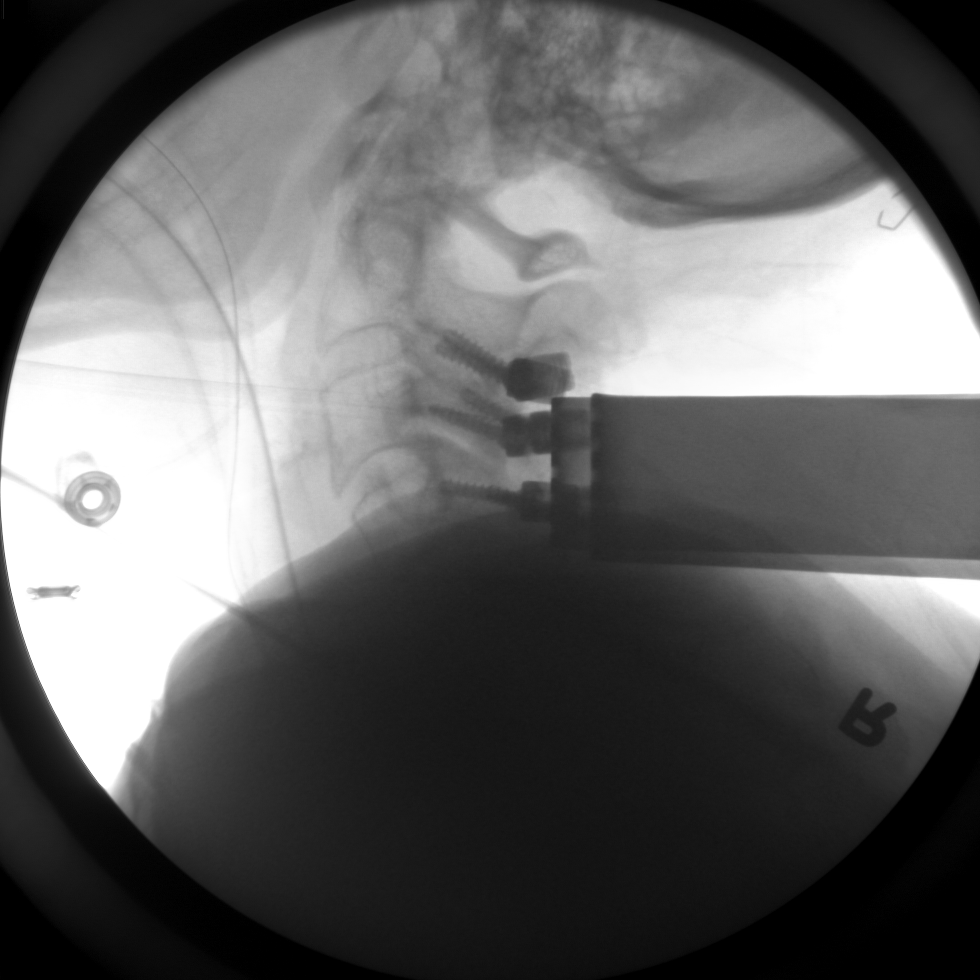

[1 of 1 positions shown; findings below may reference images not displayed]

FINDINGS: A single intraoperative fluoroscopic lateral spot image documents
placement of screws overlying C2, C3, and C4 posterior elements. The
cervicothoracic junction is not visualized.
IMPRESSION: 1. Posterior cervical fusion C2-C4.

## 2014-10-21 IMAGING — CR DG CHEST 2V
2 series · 2 of 2 positions shown · non-contrast
Comparison: The 06/22/2011

CLINICAL DATA: Cough, leukocytosis

EXAM:
CHEST  2 VIEW

[w chest lat]
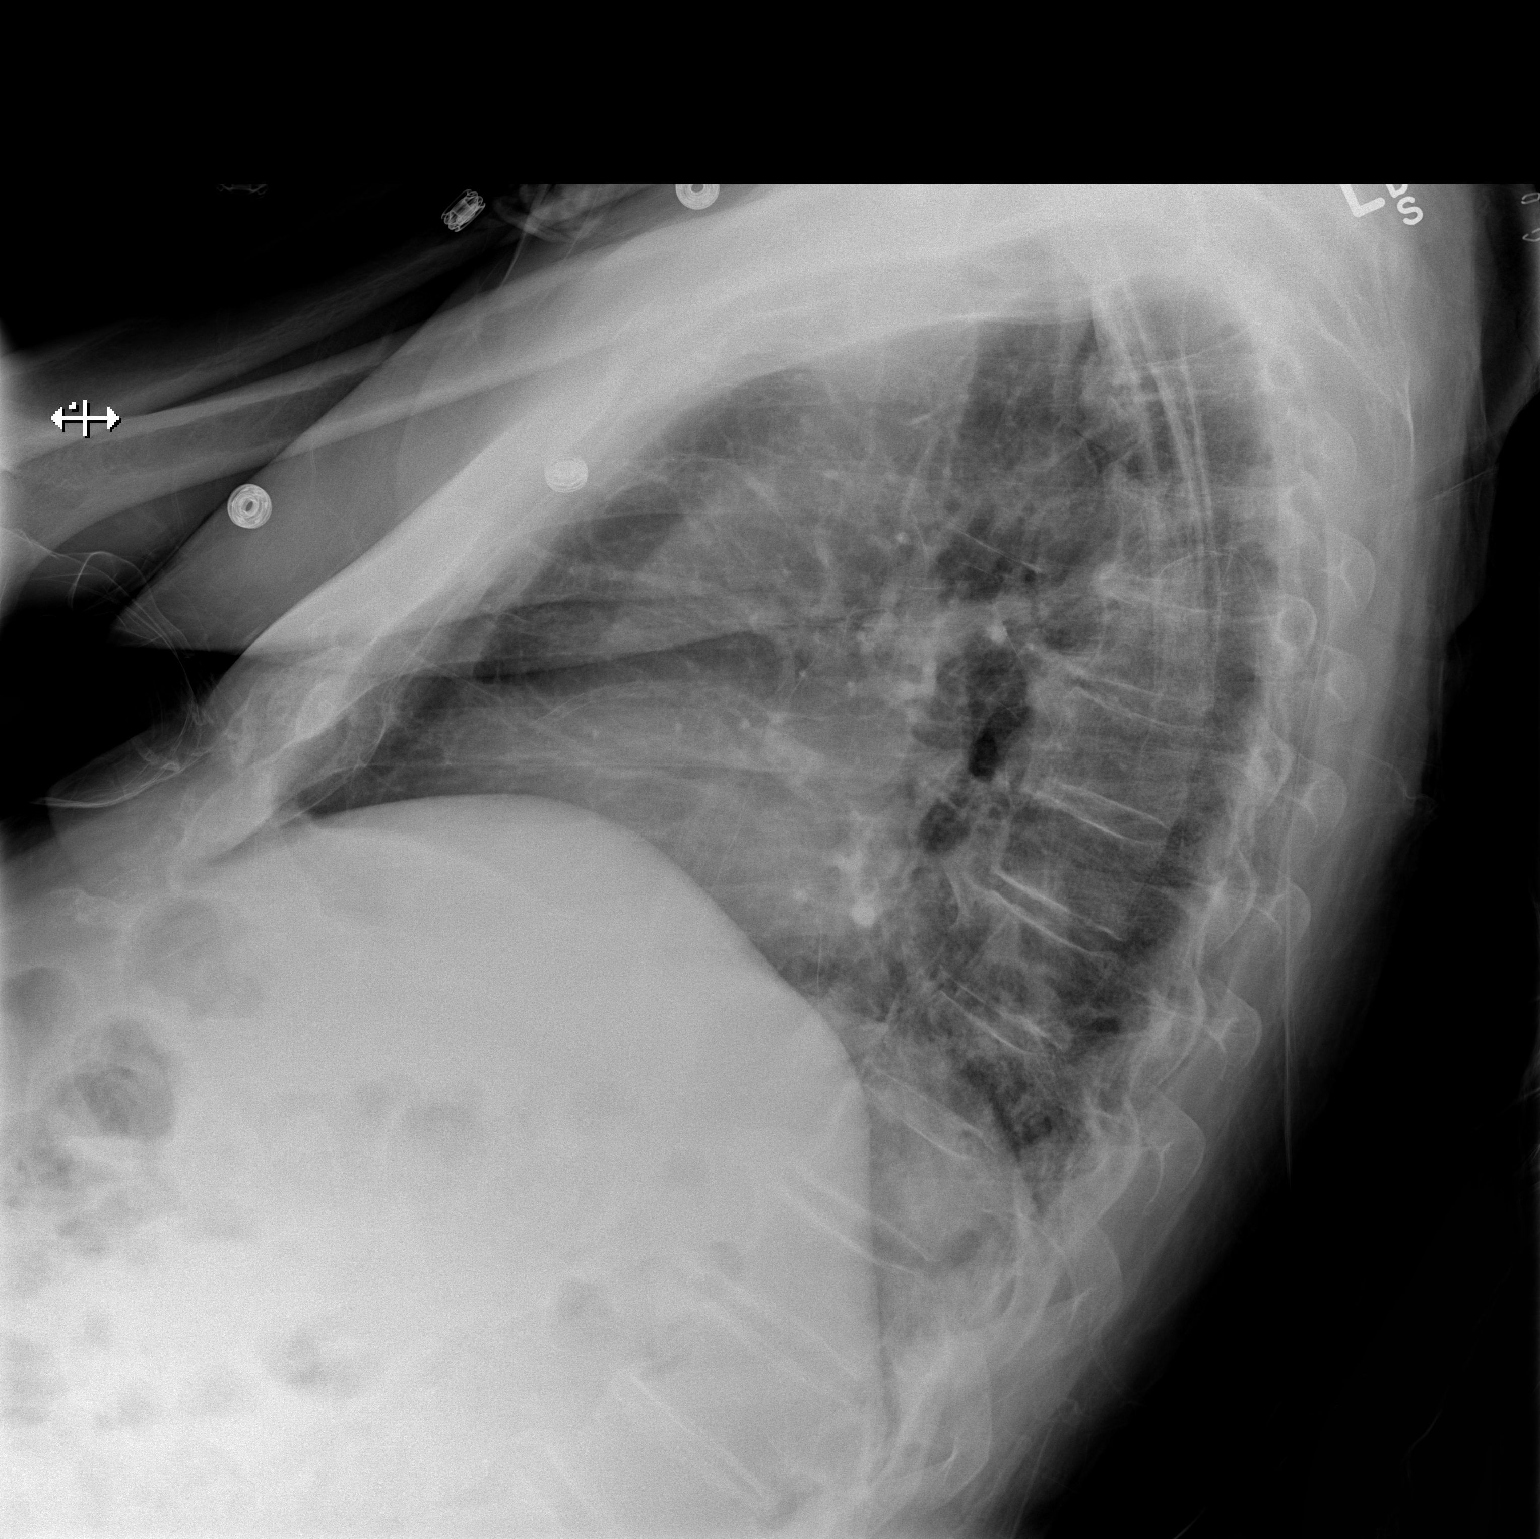

[x chest ap]
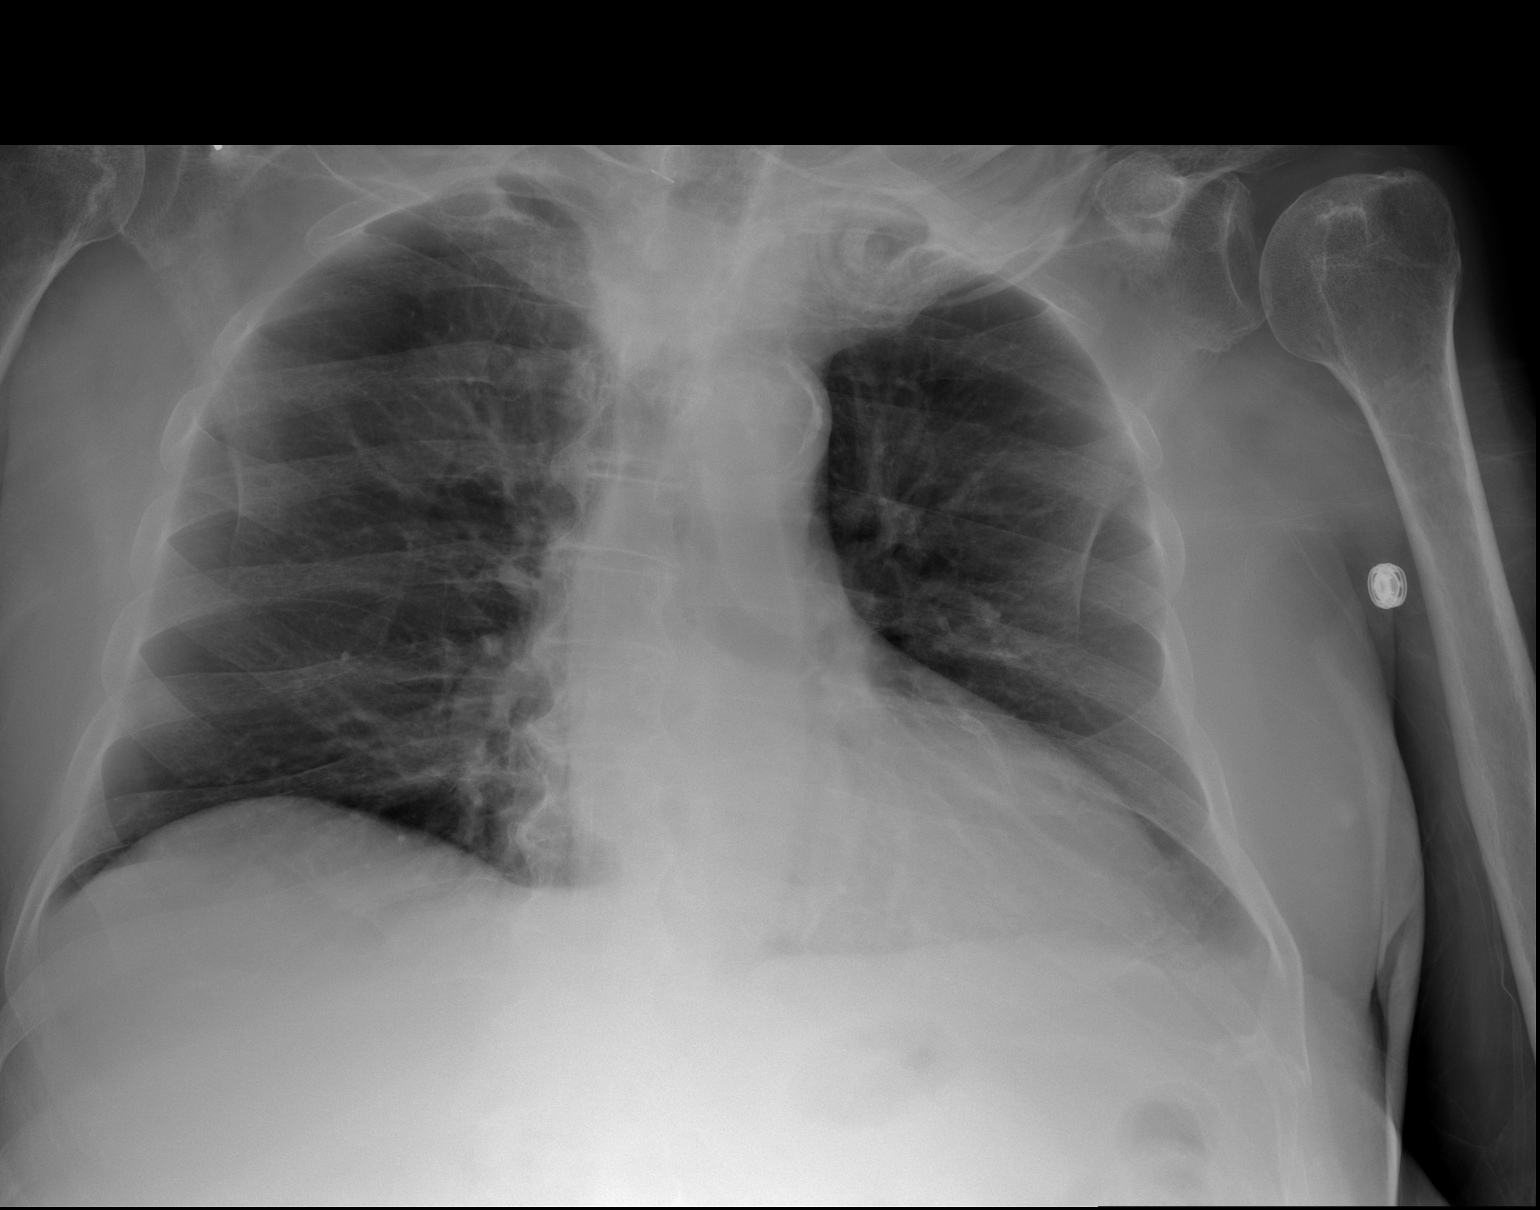

[2 of 2 positions shown; findings below may reference images not displayed]

FINDINGS: Left lower lobe atelectasis.  Minimal left effusion.

Cardiac enlargement without heart failure. Right lung is clear.
Surgical staples in the right neck.
IMPRESSION: Left lower lobe atelectasis and small left effusion.
